# Patient Record
Sex: Female | Born: 1945 | Race: White | Hispanic: No | Smoking: Never smoker
Health system: Southern US, Community
[De-identification: ages and names within clinical notes are randomized; demographics above are authoritative.]

## PROBLEM LIST (undated history)

## (undated) DIAGNOSIS — H409 Unspecified glaucoma: Secondary | ICD-10-CM

## (undated) DIAGNOSIS — I119 Hypertensive heart disease without heart failure: Secondary | ICD-10-CM

## (undated) DIAGNOSIS — E7800 Pure hypercholesterolemia, unspecified: Secondary | ICD-10-CM

## (undated) DIAGNOSIS — H3581 Retinal edema: Secondary | ICD-10-CM

## (undated) DIAGNOSIS — N184 Chronic kidney disease, stage 4 (severe): Secondary | ICD-10-CM

## (undated) DIAGNOSIS — I639 Cerebral infarction, unspecified: Secondary | ICD-10-CM

## (undated) DIAGNOSIS — I5032 Chronic diastolic (congestive) heart failure: Secondary | ICD-10-CM

## (undated) DIAGNOSIS — E039 Hypothyroidism, unspecified: Secondary | ICD-10-CM

## (undated) DIAGNOSIS — Z9289 Personal history of other medical treatment: Secondary | ICD-10-CM

## (undated) DIAGNOSIS — D649 Anemia, unspecified: Secondary | ICD-10-CM

## (undated) DIAGNOSIS — I2721 Secondary pulmonary arterial hypertension: Secondary | ICD-10-CM

## (undated) DIAGNOSIS — I2510 Atherosclerotic heart disease of native coronary artery without angina pectoris: Secondary | ICD-10-CM

## (undated) DIAGNOSIS — J189 Pneumonia, unspecified organism: Secondary | ICD-10-CM

## (undated) DIAGNOSIS — H543 Unqualified visual loss, both eyes: Secondary | ICD-10-CM

## (undated) DIAGNOSIS — K219 Gastro-esophageal reflux disease without esophagitis: Secondary | ICD-10-CM

## (undated) DIAGNOSIS — I38 Endocarditis, valve unspecified: Secondary | ICD-10-CM

## (undated) DIAGNOSIS — E119 Type 2 diabetes mellitus without complications: Secondary | ICD-10-CM

## (undated) HISTORY — PX: CATARACT EXTRACTION W/ INTRAOCULAR LENS  IMPLANT, BILATERAL: SHX1307

---

## 2001-09-04 DIAGNOSIS — Z9289 Personal history of other medical treatment: Secondary | ICD-10-CM

## 2001-09-04 HISTORY — DX: Personal history of other medical treatment: Z92.89

## 2002-01-02 HISTORY — PX: CORONARY ARTERY BYPASS GRAFT: SHX141

## 2014-10-07 ENCOUNTER — Encounter (HOSPITAL_COMMUNITY): Payer: Self-pay

## 2014-10-07 ENCOUNTER — Inpatient Hospital Stay (HOSPITAL_COMMUNITY)
Admission: EM | Admit: 2014-10-07 | Discharge: 2014-10-10 | DRG: 291 | Payer: Medicare PPO | Attending: Internal Medicine | Admitting: Internal Medicine

## 2014-10-07 ENCOUNTER — Emergency Department (HOSPITAL_COMMUNITY): Payer: Medicare PPO

## 2014-10-07 DIAGNOSIS — J9601 Acute respiratory failure with hypoxia: Secondary | ICD-10-CM | POA: Diagnosis present

## 2014-10-07 DIAGNOSIS — E039 Hypothyroidism, unspecified: Secondary | ICD-10-CM | POA: Diagnosis present

## 2014-10-07 DIAGNOSIS — I272 Other secondary pulmonary hypertension: Secondary | ICD-10-CM | POA: Diagnosis present

## 2014-10-07 DIAGNOSIS — I509 Heart failure, unspecified: Secondary | ICD-10-CM

## 2014-10-07 DIAGNOSIS — N281 Cyst of kidney, acquired: Secondary | ICD-10-CM | POA: Diagnosis present

## 2014-10-07 DIAGNOSIS — Z888 Allergy status to other drugs, medicaments and biological substances status: Secondary | ICD-10-CM

## 2014-10-07 DIAGNOSIS — Z7982 Long term (current) use of aspirin: Secondary | ICD-10-CM

## 2014-10-07 DIAGNOSIS — H548 Legal blindness, as defined in USA: Secondary | ICD-10-CM | POA: Diagnosis present

## 2014-10-07 DIAGNOSIS — R0602 Shortness of breath: Secondary | ICD-10-CM | POA: Diagnosis present

## 2014-10-07 DIAGNOSIS — Z794 Long term (current) use of insulin: Secondary | ICD-10-CM

## 2014-10-07 DIAGNOSIS — I2510 Atherosclerotic heart disease of native coronary artery without angina pectoris: Secondary | ICD-10-CM | POA: Diagnosis present

## 2014-10-07 DIAGNOSIS — N289 Disorder of kidney and ureter, unspecified: Secondary | ICD-10-CM

## 2014-10-07 DIAGNOSIS — E785 Hyperlipidemia, unspecified: Secondary | ICD-10-CM | POA: Diagnosis present

## 2014-10-07 DIAGNOSIS — R011 Cardiac murmur, unspecified: Secondary | ICD-10-CM | POA: Diagnosis present

## 2014-10-07 DIAGNOSIS — N182 Chronic kidney disease, stage 2 (mild): Secondary | ICD-10-CM | POA: Diagnosis present

## 2014-10-07 DIAGNOSIS — I129 Hypertensive chronic kidney disease with stage 1 through stage 4 chronic kidney disease, or unspecified chronic kidney disease: Secondary | ICD-10-CM | POA: Diagnosis present

## 2014-10-07 DIAGNOSIS — D649 Anemia, unspecified: Secondary | ICD-10-CM

## 2014-10-07 DIAGNOSIS — H409 Unspecified glaucoma: Secondary | ICD-10-CM | POA: Diagnosis present

## 2014-10-07 DIAGNOSIS — N19 Unspecified kidney failure: Secondary | ICD-10-CM

## 2014-10-07 DIAGNOSIS — K219 Gastro-esophageal reflux disease without esophagitis: Secondary | ICD-10-CM | POA: Diagnosis present

## 2014-10-07 DIAGNOSIS — R001 Bradycardia, unspecified: Secondary | ICD-10-CM | POA: Diagnosis present

## 2014-10-07 DIAGNOSIS — I10 Essential (primary) hypertension: Secondary | ICD-10-CM

## 2014-10-07 DIAGNOSIS — I5043 Acute on chronic combined systolic (congestive) and diastolic (congestive) heart failure: Principal | ICD-10-CM | POA: Diagnosis present

## 2014-10-07 DIAGNOSIS — I5040 Unspecified combined systolic (congestive) and diastolic (congestive) heart failure: Secondary | ICD-10-CM

## 2014-10-07 DIAGNOSIS — I38 Endocarditis, valve unspecified: Secondary | ICD-10-CM | POA: Diagnosis not present

## 2014-10-07 DIAGNOSIS — E119 Type 2 diabetes mellitus without complications: Secondary | ICD-10-CM | POA: Diagnosis present

## 2014-10-07 DIAGNOSIS — R0600 Dyspnea, unspecified: Secondary | ICD-10-CM

## 2014-10-07 DIAGNOSIS — Z951 Presence of aortocoronary bypass graft: Secondary | ICD-10-CM | POA: Diagnosis not present

## 2014-10-07 DIAGNOSIS — E038 Other specified hypothyroidism: Secondary | ICD-10-CM

## 2014-10-07 DIAGNOSIS — E1121 Type 2 diabetes mellitus with diabetic nephropathy: Secondary | ICD-10-CM

## 2014-10-07 HISTORY — DX: Cerebral infarction, unspecified: I63.9

## 2014-10-07 HISTORY — DX: Hypothyroidism, unspecified: E03.9

## 2014-10-07 HISTORY — DX: Unspecified glaucoma: H40.9

## 2014-10-07 HISTORY — DX: Unqualified visual loss, both eyes: H54.3

## 2014-10-07 HISTORY — DX: Pure hypercholesterolemia, unspecified: E78.00

## 2014-10-07 HISTORY — DX: Personal history of other medical treatment: Z92.89

## 2014-10-07 HISTORY — DX: Atherosclerotic heart disease of native coronary artery without angina pectoris: I25.10

## 2014-10-07 HISTORY — DX: Type 2 diabetes mellitus without complications: E11.9

## 2014-10-07 HISTORY — DX: Pneumonia, unspecified organism: J18.9

## 2014-10-07 HISTORY — DX: Retinal edema: H35.81

## 2014-10-07 LAB — BASIC METABOLIC PANEL
Anion gap: 7 (ref 5–15)
BUN: 29 mg/dL — ABNORMAL HIGH (ref 6–23)
CHLORIDE: 105 mmol/L (ref 96–112)
CO2: 26 mmol/L (ref 19–32)
Calcium: 8.5 mg/dL (ref 8.4–10.5)
Creatinine, Ser: 1.82 mg/dL — ABNORMAL HIGH (ref 0.50–1.10)
GFR, EST AFRICAN AMERICAN: 32 mL/min — AB (ref >90)
GFR, EST NON AFRICAN AMERICAN: 27 mL/min — AB (ref >90)
Glucose, Bld: 175 mg/dL — ABNORMAL HIGH (ref 70–99)
POTASSIUM: 3.9 mmol/L (ref 3.5–5.1)
SODIUM: 138 mmol/L (ref 135–145)

## 2014-10-07 LAB — URINE MICROSCOPIC-ADD ON

## 2014-10-07 LAB — URINALYSIS, ROUTINE W REFLEX MICROSCOPIC
Bilirubin Urine: NEGATIVE
Glucose, UA: NEGATIVE mg/dL
Hgb urine dipstick: NEGATIVE
KETONES UR: NEGATIVE mg/dL
LEUKOCYTES UA: NEGATIVE
NITRITE: NEGATIVE
PROTEIN: 30 mg/dL — AB
Specific Gravity, Urine: 1.008 (ref 1.005–1.030)
Urobilinogen, UA: 0.2 mg/dL (ref 0.0–1.0)
pH: 7 (ref 5.0–8.0)

## 2014-10-07 LAB — CBC
HEMATOCRIT: 25.4 % — AB (ref 36.0–46.0)
Hemoglobin: 8 g/dL — ABNORMAL LOW (ref 12.0–15.0)
MCH: 28.5 pg (ref 26.0–34.0)
MCHC: 31.5 g/dL (ref 30.0–36.0)
MCV: 90.4 fL (ref 78.0–100.0)
Platelets: 263 10*3/uL (ref 150–400)
RBC: 2.81 MIL/uL — ABNORMAL LOW (ref 3.87–5.11)
RDW: 16.4 % — ABNORMAL HIGH (ref 11.5–15.5)
WBC: 7.4 10*3/uL (ref 4.0–10.5)

## 2014-10-07 LAB — BRAIN NATRIURETIC PEPTIDE: B NATRIURETIC PEPTIDE 5: 2584.7 pg/mL — AB (ref 0.0–100.0)

## 2014-10-07 LAB — OSMOLALITY: OSMOLALITY: 294 mosm/kg (ref 275–300)

## 2014-10-07 LAB — I-STAT TROPONIN, ED: TROPONIN I, POC: 0.02 ng/mL (ref 0.00–0.08)

## 2014-10-07 LAB — GLUCOSE, CAPILLARY: GLUCOSE-CAPILLARY: 218 mg/dL — AB (ref 70–99)

## 2014-10-07 LAB — TROPONIN I: Troponin I: <0.03 ng/mL (ref <0.031)

## 2014-10-07 LAB — SODIUM, URINE, RANDOM: SODIUM UR: 63 mmol/L

## 2014-10-07 LAB — CREATININE, URINE, RANDOM: CREATININE, URINE: 24.85 mg/dL

## 2014-10-07 LAB — OSMOLALITY, URINE: Osmolality, Ur: 262 mOsm/kg — ABNORMAL LOW (ref 390–1090)

## 2014-10-07 MED ORDER — LEVOTHYROXINE SODIUM 88 MCG PO TABS
88.0000 ug | ORAL_TABLET | Freq: Every day | ORAL | Status: DC
  Administered 2014-10-08 – 2014-10-09 (×2): 88 ug via ORAL
  Filled 2014-10-07 (×3): qty 1

## 2014-10-07 MED ORDER — TRAMADOL HCL 50 MG PO TABS: 100.0000 mg | ORAL_TABLET | Freq: Four times a day (QID) | ORAL | Status: DC

## 2014-10-07 MED ORDER — FUROSEMIDE 10 MG/ML IJ SOLN: 60.0000 mg | Freq: Two times a day (BID) | INTRAMUSCULAR | Status: DC

## 2014-10-07 MED ORDER — ARIPIPRAZOLE 2 MG PO TABS
2.0000 mg | ORAL_TABLET | Freq: Every day | ORAL | Status: DC
  Administered 2014-10-08 – 2014-10-10 (×3): 2 mg via ORAL
  Filled 2014-10-07 (×3): qty 1

## 2014-10-07 MED ORDER — SERTRALINE HCL 100 MG PO TABS
200.0000 mg | ORAL_TABLET | Freq: Every day | ORAL | Status: DC
  Administered 2014-10-07 – 2014-10-09 (×3): 200 mg via ORAL
  Filled 2014-10-07 (×5): qty 2

## 2014-10-07 MED ORDER — ASPIRIN 325 MG PO TABS
325.0000 mg | ORAL_TABLET | Freq: Every day | ORAL | Status: DC
  Administered 2014-10-08: 325 mg via ORAL
  Filled 2014-10-07: qty 1

## 2014-10-07 MED ORDER — POLYVINYL ALCOHOL 1.4 % OP SOLN
1.0000 [drp] | Freq: Two times a day (BID) | OPHTHALMIC | Status: DC
  Administered 2014-10-07 – 2014-10-10 (×6): 1 [drp] via OPHTHALMIC
  Filled 2014-10-07 (×2): qty 15

## 2014-10-07 MED ORDER — INSULIN GLARGINE 100 UNIT/ML ~~LOC~~ SOLN
20.0000 [IU] | Freq: Two times a day (BID) | SUBCUTANEOUS | Status: DC
  Administered 2014-10-07 – 2014-10-10 (×3): 20 [IU] via SUBCUTANEOUS
  Filled 2014-10-07 (×7): qty 0.2

## 2014-10-07 MED ORDER — FUROSEMIDE 10 MG/ML IJ SOLN
40.0000 mg | Freq: Once | INTRAMUSCULAR | Status: AC
  Administered 2014-10-07: 40 mg via INTRAVENOUS
  Filled 2014-10-07: qty 4

## 2014-10-07 MED ORDER — LOSARTAN POTASSIUM 50 MG PO TABS: 50.0000 mg | ORAL_TABLET | Freq: Every day | ORAL | Status: DC

## 2014-10-07 MED ORDER — CLOPIDOGREL BISULFATE 75 MG PO TABS
75.0000 mg | ORAL_TABLET | Freq: Every day | ORAL | Status: DC
  Administered 2014-10-08 – 2014-10-10 (×3): 75 mg via ORAL
  Filled 2014-10-07 (×3): qty 1

## 2014-10-07 MED ORDER — SODIUM CHLORIDE 0.9 % IJ SOLN
3.0000 mL | Freq: Two times a day (BID) | INTRAMUSCULAR | Status: DC
  Administered 2014-10-07 – 2014-10-10 (×6): 3 mL via INTRAVENOUS

## 2014-10-07 MED ORDER — FUROSEMIDE 10 MG/ML IJ SOLN
60.0000 mg | Freq: Two times a day (BID) | INTRAMUSCULAR | Status: DC
  Administered 2014-10-08 – 2014-10-10 (×5): 60 mg via INTRAVENOUS
  Filled 2014-10-07 (×7): qty 6

## 2014-10-07 MED ORDER — POTASSIUM CHLORIDE CRYS ER 20 MEQ PO TBCR
20.0000 meq | EXTENDED_RELEASE_TABLET | Freq: Two times a day (BID) | ORAL | Status: DC
  Administered 2014-10-07 – 2014-10-10 (×6): 20 meq via ORAL
  Filled 2014-10-07 (×7): qty 1

## 2014-10-07 MED ORDER — SODIUM CHLORIDE 0.9 % IJ SOLN: 3.0000 mL | INTRAMUSCULAR | Status: DC | PRN

## 2014-10-07 MED ORDER — FUROSEMIDE 10 MG/ML IJ SOLN
80.0000 mg | Freq: Two times a day (BID) | INTRAMUSCULAR | Status: DC
  Filled 2014-10-07: qty 8

## 2014-10-07 MED ORDER — TRAMADOL HCL 50 MG PO TABS
100.0000 mg | ORAL_TABLET | Freq: Two times a day (BID) | ORAL | Status: DC
  Administered 2014-10-07 – 2014-10-09 (×2): 100 mg via ORAL
  Filled 2014-10-07 (×3): qty 2

## 2014-10-07 MED ORDER — PANTOPRAZOLE SODIUM 20 MG PO TBEC
20.0000 mg | DELAYED_RELEASE_TABLET | Freq: Every day | ORAL | Status: DC
  Administered 2014-10-08 – 2014-10-10 (×3): 20 mg via ORAL
  Filled 2014-10-07 (×3): qty 1

## 2014-10-07 MED ORDER — DORZOLAMIDE HCL-TIMOLOL MAL 2-0.5 % OP SOLN
1.0000 [drp] | Freq: Two times a day (BID) | OPHTHALMIC | Status: DC
  Administered 2014-10-07 – 2014-10-10 (×6): 1 [drp] via OPHTHALMIC
  Filled 2014-10-07 (×2): qty 10

## 2014-10-07 MED ORDER — PREDNISOLONE ACETATE 1 % OP SUSP
1.0000 [drp] | Freq: Four times a day (QID) | OPHTHALMIC | Status: DC
  Administered 2014-10-07 – 2014-10-10 (×11): 1 [drp] via OPHTHALMIC
  Filled 2014-10-07 (×2): qty 1

## 2014-10-07 MED ORDER — SODIUM CHLORIDE 0.9 % IV SOLN: 250.0000 mL | INTRAVENOUS | Status: DC | PRN

## 2014-10-07 MED ORDER — ISOSORBIDE DINITRATE 10 MG PO TABS
10.0000 mg | ORAL_TABLET | Freq: Three times a day (TID) | ORAL | Status: DC
  Administered 2014-10-07 – 2014-10-10 (×8): 10 mg via ORAL
  Filled 2014-10-07 (×11): qty 1

## 2014-10-07 MED ORDER — CARVEDILOL 6.25 MG PO TABS
6.2500 mg | ORAL_TABLET | Freq: Two times a day (BID) | ORAL | Status: DC
  Administered 2014-10-08 – 2014-10-09 (×3): 6.25 mg via ORAL
  Filled 2014-10-07 (×7): qty 1

## 2014-10-07 MED ORDER — INSULIN ASPART 100 UNIT/ML ~~LOC~~ SOLN
0.0000 [IU] | Freq: Three times a day (TID) | SUBCUTANEOUS | Status: DC
  Administered 2014-10-08: 1 [IU] via SUBCUTANEOUS
  Administered 2014-10-09: 3 [IU] via SUBCUTANEOUS
  Administered 2014-10-09: 2 [IU] via SUBCUTANEOUS
  Administered 2014-10-10 (×2): 3 [IU] via SUBCUTANEOUS

## 2014-10-07 MED ORDER — HEPARIN SODIUM (PORCINE) 5000 UNIT/ML IJ SOLN
5000.0000 [IU] | Freq: Three times a day (TID) | INTRAMUSCULAR | Status: DC
  Administered 2014-10-07 – 2014-10-10 (×9): 5000 [IU] via SUBCUTANEOUS
  Filled 2014-10-07 (×10): qty 1

## 2014-10-07 MED ORDER — ATORVASTATIN CALCIUM 80 MG PO TABS
80.0000 mg | ORAL_TABLET | Freq: Every day | ORAL | Status: DC
Start: 2014-10-07 — End: 2014-10-10
  Administered 2014-10-07 – 2014-10-09 (×3): 80 mg via ORAL
  Filled 2014-10-07 (×5): qty 1

## 2014-10-07 MED ORDER — INSULIN ASPART 100 UNIT/ML ~~LOC~~ SOLN
0.0000 [IU] | Freq: Every day | SUBCUTANEOUS | Status: DC
  Administered 2014-10-09: 4 [IU] via SUBCUTANEOUS

## 2014-10-07 MED ORDER — HYDRALAZINE HCL 50 MG PO TABS
50.0000 mg | ORAL_TABLET | Freq: Three times a day (TID) | ORAL | Status: DC
  Administered 2014-10-07 – 2014-10-09 (×5): 50 mg via ORAL
  Filled 2014-10-07 (×9): qty 1

## 2014-10-07 MED ORDER — AMLODIPINE BESYLATE 10 MG PO TABS
10.0000 mg | ORAL_TABLET | Freq: Every day | ORAL | Status: DC
  Administered 2014-10-08 – 2014-10-10 (×3): 10 mg via ORAL
  Filled 2014-10-07 (×3): qty 1

## 2014-10-07 MED ORDER — FUROSEMIDE 10 MG/ML IJ SOLN
40.0000 mg | Freq: Once | INTRAMUSCULAR | Status: AC
  Administered 2014-10-07: 40 mg via INTRAVENOUS

## 2014-10-07 MED ORDER — ONDANSETRON HCL 4 MG/2ML IJ SOLN: 4.0000 mg | Freq: Four times a day (QID) | INTRAMUSCULAR | Status: DC | PRN

## 2014-10-07 MED ORDER — POLYETHYL GLYCOL-PROPYL GLYCOL 0.4-0.3 % OP SOLN: 1.0000 [drp] | Freq: Two times a day (BID) | OPHTHALMIC | Status: DC

## 2014-10-07 MED ORDER — GUAIFENESIN-DM 100-10 MG/5ML PO SYRP
5.0000 mL | ORAL_SOLUTION | ORAL | Status: DC | PRN
  Administered 2014-10-07: 5 mL via ORAL
  Filled 2014-10-07: qty 5

## 2014-10-07 MED ORDER — HYDRALAZINE HCL 50 MG PO TABS: 50.0000 mg | ORAL_TABLET | Freq: Three times a day (TID) | ORAL | Status: DC

## 2014-10-07 MED ORDER — ACETAMINOPHEN 325 MG PO TABS
650.0000 mg | ORAL_TABLET | ORAL | Status: DC | PRN
  Administered 2014-10-10: 650 mg via ORAL
  Filled 2014-10-07: qty 2

## 2014-10-07 MED ORDER — LEVOTHYROXINE SODIUM 88 MCG PO TABS
0.8800 ug | ORAL_TABLET | Freq: Every day | ORAL | Status: DC
Start: 2014-10-07 — End: 2014-10-07

## 2014-10-07 NOTE — ED Notes (Signed)
Order Tray for patient.  

## 2014-10-07 NOTE — ED Notes (Signed)
Admitting MD at the bedside.  

## 2014-10-07 NOTE — ED Notes (Signed)
MD at the bedside  

## 2014-10-07 NOTE — ED Notes (Signed)
Per EMS, [facility] [place] [address] complaining of SOB that has been going on for four days. Patient had dizziness that started today. Patient started to complain of all the symptoms the day after her mother died. Patient has been bradycardic with EMS. 46 HR 96% on RA, 170/70, CBG 298,  Patient has history of HTN, DM, and Blindness. Lung fields are clear. Patient is alert and oriented x4 upon arrival.

## 2014-10-07 NOTE — ED Notes (Signed)
Xray at the bedside.

## 2014-10-07 NOTE — ED Notes (Signed)
Phlebotomy at the bedside  

## 2014-10-07 NOTE — Consult Note (Signed)
CARDIOLOGY CONSULT NOTE   Patient ID: Taylor Padilla MRN: [id number], DOB/AGE: 1946/05/21   Admit date: 10/07/2014 Date of Consult: 10/07/2014   Primary Physician: No primary care provider on file. Primary Cardiologist: New  Pt. Profile  69 year old Caucasian female with past medical history of HTN, DM, glaucoma with severely diminished vision, chronic kidney disease, anemia, and CAD s/p 4v CABG in 2003 in [place] present with HF symptom  Problem List  Past Medical History  Diagnosis Date  . Diabetes mellitus without complication   . Hypertension   . Glaucoma   . Blindness   . Renal disorder     Chronic Kidney Failure  . CAD (coronary artery disease)     Past Surgical History  Procedure Laterality Date  . Cardiac surgery      Quadruple Bypass Surgery 2003 at [facility]     Allergies  Allergies  Allergen Reactions  . Epinephrine     HPI   The patient is a pleasant 69 year old Caucasian female with past medical history of HTN, DM, glaucoma with severely diminished vision, chronic kidney disease, anemia, and CAD s/p 4v CABG in 2003 in [place] (by Dr. Harrison Mons?). According to the patient, she denies ever having chest pain in the past even prior to the bypass surgery. She states she went to the hospital because her systolic blood pressure was over 200 at the time, and the next thing she knew, she was told she had significant coronary artery disease and underwent bypass surgery. Since that time, she has been doing well. It has been several years since she was last seen by her cardiologist. Her last stress test was over a year ago. Since then, most for cardiac medication has been managed by her PCP in [place]. She denies any awareness of weakened heart or low ejection fraction, however she states she has been taking Lasix for many years. She states her husband died over a year ago, and she was having trouble taking care of herself living in a big house (they have no  children), therefore she decided to move to [facility] in December 2015 to be closer to her brother's family. She currently lives in [facility] in [place]. She states they have been managing her medication. Due to her limited sight with glaucoma, she has not been very active since moved to assisted living facility. Since December, patient has gradually increased the number of pillows to 3, in the last month she began to notice increasing lower extremity edema. She also endorsed a nonproductive cough. She does frequently wake up in the middle of the night, however she has attributed to her usual habit instead of shortness of breath. She denies ever having chest pain. Her mother passed away on 10/06/2014, since 1/31, patient has been struggling with increasing shortness of breath at rest and orthopnea. She decided to seek medical attention at [facility] on 09/06/2014.  On arrival, significant laboratory finding include creatinine of 1.82. BNP 2584. Hemoglobin 8.0. She states she has a known history of anemia. Glucose 175. Chest x-ray was concerning for heart failure. EKG showed sinus bradycardia with nonspecific T-wave changes. Cardiology has been consulted for heart failure.  Inpatient Medications  . losartan  50 mg Oral Daily    Family History Family History  Problem Relation Age of Onset  . CAD Neg Hx      Social History History   Social History  . Marital Status: Widowed    Spouse Name: N/A  Number of Children: N/A  . Years of Education: N/A   Occupational History  . Not on file.   Social History Main Topics  . Smoking status: Never Smoker   . Smokeless tobacco: Never Used  . Alcohol Use: No  . Drug Use: No  . Sexual Activity: Not on file   Other Topics Concern  . Not on file   Social History Narrative   Live in [facility], moved from [place] in Dec 2015     Review of Systems  General:  No chills, fever,  night sweats or weight changes.  Cardiovascular:  No chest pain. +dyspnea on exertion, edema, orthopnea, paroxysmal nocturnal dyspnea. Dermatological: No rash, lesions/masses Respiratory: +cough, dyspnea Urologic: No hematuria, dysuria Abdominal:   No nausea, vomiting, diarrhea, bright red blood per rectum, melena, or hematemesis Neurologic:  No visual changes, changes in mental status. +wkns All other systems reviewed and are otherwise negative except as noted above.  Physical Exam  Blood pressure 166/50, pulse 48, temperature 97.9 F (36.6 C), temperature source Oral, resp. rate 19, height  (1.549 m), weight 149 lb (67.586 kg), SpO2 93 %.  General: Pleasant, NAD Psych: Normal affect. Neuro: Alert and oriented X 3. Moves all extremities spontaneously. HEENT: Normal  Neck: Murmur radiate to bilateral neck, unable to differentiate bruits  +JVD. Lungs:  Resp regular and unlabored. Bilateral basilar rale.  Heart: RRR no s3, s4. 2/6 systolic murmur Abdomen: Soft, non-tender, non-distended, BS + x 4.  Extremities: No clubbing, cyanosis. DP/PT/Radials 2+ and equal bilaterally. 3+ pitting edema bilaterally.  Labs  No results for input(s): CKTOTAL, CKMB, TROPONINI in the last 72 hours. Lab Results  Component Value Date   WBC 7.4 10/07/2014   HGB 8.0* 10/07/2014   HCT 25.4* 10/07/2014   MCV 90.4 10/07/2014   PLT 263 10/07/2014     Recent Labs Lab 10/07/14 1327  NA 138  K 3.9  CL 105  CO2 26  BUN 29*  CREATININE 1.82*  CALCIUM 8.5  GLUCOSE 175*   No results found for: CHOL, HDL, LDLCALC, TRIG No results found for: DDIMER  Radiology/Studies  Dg Chest Port 1 View  10/07/2014   CLINICAL DATA:  Shortness of breath  EXAM: PORTABLE CHEST - 1 VIEW  COMPARISON:  None available  FINDINGS: Previous CABG. Interstitial and airspace opacities in both lung bases, right greater than left. Small pleural effusions. Mild cardiomegaly. Atheromatous aorta. No pneumothorax.  IMPRESSION: 1.  Mild cardiomegaly with bibasilar infiltrate/edema and small effusions, suspect CHF.   Electronically Signed   By: Oley Balm M.D.   On: 10/07/2014 13:33    ECG  EKG showed sinus bradycardia with nonspecific T-wave changes.  ASSESSMENT AND PLAN  1. Acute on chronic (likely systolic) heart failure  - limited information regarding her PMH. Although her SOB began 2 days after her mother died, other HF symptom has been going for at least >2 month  - obtain echocardiogram to establish baseline, TSH  - IV diuresis with  BID lasix, place folley for 48 hours  - continue coreg, add isordil and hydralazine   2. Chronic renal insufficiency, unknown baseline  - may benefit from nephrology eval and followup locally  - hold ARB, added antihypertensive meds as below  3. CAD s/p 4v CABG in 2003  - continue ASA and plavix, trend hgb  - trend trop. Denies any CP recently  4. Anemia, h/o anemia per pt, trend 5. HTN  - uncontrolled. Check renal artery  duplex  - continue amlodipine, coreg, clonidine. Hold cozaar given renal dysfunction. Add isordil and hydralazine 6. DM 7. glaucoma with severely diminished vision 8. Systolic murmur on exam: pending echo   Ramond DialSigned, Meng, Hao, PA-C 10/07/2014, 4:33 PM  Patient examined chart reviewed. Poorly controlled diabetic with HTN.  Distant CABG in [place] 2003.  Does not describe MI before surgery or previous CHF.  Troponin negative no acute ECG changes just bradycardic.  Significant renal failure Cr 1.8.  Exam remarkable for SEM basilar rales and plus 2 LE edema.  She is also nearly blind and appears to be barely able to care for herself.  Echo to assess EF and renal duplex to assess kidneys.  Stop ARB And Rx BP and CAD with hydralazine and nitrates.  Avoid beta blockers with renal failure and bradycardia.  Will likely need nephrology ivolved in am as Cr will rise with diuresis   Charlton HawsPeter Nishan

## 2014-10-07 NOTE — ED Notes (Signed)
SPoke with Admitting MD about patient's BP. RN 3East made aware.

## 2014-10-07 NOTE — ED Notes (Signed)
Attempted Report x1.   

## 2014-10-07 NOTE — H&P (Signed)
[facility] History and Physical                                                                                    Taylor Padilla, is a 69 y.o. female  MRN: [id number]   DOB - 1946-05-16  Admit Date - 10/07/2014  Outpatient Primary MD for the patient is No primary care provider on file.  With History of -  Past Medical History  Diagnosis Date  . Diabetes mellitus without complication   . Hypertension   . Glaucoma   . Blindness   . Renal disorder     Chronic Kidney Failure  . CAD (coronary artery disease)       Past Surgical History  Procedure Laterality Date  . Cardiac surgery      Quadruple Bypass Surgery 2003     in for   Chief Complaint  Patient presents with  . Shortness of Breath     HPI Taylor Padilla  is a 69 y.o. female, with history of CABG in 2003 in [place], diabetes, hypertension, chronic kidney disease stage II and legally blind. Patient currently resides at assisted living facility. She recently moved to the [facility] area December 2015. She was sent over from the assisted living facility for complaints of shortness of breath. Chest x-ray completed in the ER revealed mild cardiomegaly with bibasilar infiltrate versus edema and small effusions which appear to be consistent with CHF. ProBNP was elevated at 2584 and troponin was normal 0.02. Utilizing care everywhere I was able to determine that the patient's current renal function 29/1.82 was at her baseline and her hemoglobin of 8.0 was slightly below her baseline of 8.6. She also undergone an echocardiogram in 2013 which revealed preserved LVEF with grade 2 diastolic dysfunction, with mild pulmonary hypertension and mild pulmonic valvular regurg. When she was arrives to the emergency department she was hypoxemic with saturations of 89% oxygen was placed. Because of the abnormal x-ray findings she was given IV Lasix and has begun diuresing. Patient reports she has yet to establish with a  primary care physician or cardiologist in the area. Patient reports at baseline with assistance she is able to ambulate with walker and has noticed dyspnea on exertion without chest pain and progressive lower stream and the edema for 4 weeks. She endorses that prior to her CABG procedure she did not have any chest pain and her primary presenting symptom was hypertensive urgency.  Review of Systems   In addition to the HPI above;  No Fever-chills, No Headache, No changes with Vision or hearing, No problems swallowing food or Liquids, No Chest pain, palpitations, or cough No Abdominal pain, No Nausea or Vomiting, Bowel movements are regular, No Blood in stool or Urine, or dysuria No new skin rashes or bruises, No new joints pains-aches,  No new weakness, tingling, numbness in any extremity, No recent weight gain or loss, No polyuria, polydypsia or polyphagia, A full 10 point Review of Systems was done, except as stated above, all other Review of Systems were negative.  Social History History  Substance Use Topics  . Smoking status: Never Smoker   . Smokeless  tobacco: Never Used  . Alcohol Use: No       Resides at assisted living facility since December 2015  Family History History reviewed. No pertinent family history.  Prior to Admission medications   Medication Sig Start Date End Date Taking? Authorizing Provider  amLODipine (NORVASC) 10 MG tablet Take 10 mg by mouth daily. 09/27/14  Yes Historical Provider, MD  ARIPiprazole (ABILIFY) 2 MG tablet Take 2 mg by mouth daily. 09/12/14  Yes Historical Provider, MD  aspirin 325 MG tablet Take 325 mg by mouth daily.   Yes Historical Provider, MD  atorvastatin (LIPITOR) 80 MG tablet Take 80 mg by mouth at bedtime. 09/21/14  Yes Historical Provider, MD  carvedilol (COREG) 25 MG tablet Take 25 mg by mouth 2 (two) times daily. 09/21/14  Yes Historical Provider, MD  cloNIDine (CATAPRES) 0.1 MG tablet Take 0.1 mg by mouth 4 (four) times daily.  09/21/14  Yes Historical Provider, MD  clopidogrel (PLAVIX) 75 MG tablet Take 75 mg by mouth daily. 09/24/14  Yes Historical Provider, MD  dorzolamide-timolol (COSOPT) 22.3-6.8 MG/ML ophthalmic solution Place 1 drop into both eyes every 12 (twelve) hours. 10/06/14  Yes Historical Provider, MD  furosemide (LASIX) 40 MG tablet Take 40 mg by mouth daily. Alternating every other day with 80mg  09/12/14  Yes Historical Provider, MD  furosemide (LASIX) 80 MG tablet Take 80 mg by mouth every other day. Alternating every other day with 40mg  09/13/14  Yes Historical Provider, MD  insulin aspart (NOVOLOG) 100 UNIT/ML injection Inject 5 Units into the skin 3 (three) times daily with meals.   Yes Historical Provider, MD  lansoprazole (PREVACID) 30 MG capsule Take 30 mg by mouth daily. 09/27/14  Yes Historical Provider, MD  LANTUS SOLOSTAR 100 UNIT/ML Solostar Pen Inject 20 Units into the skin 2 (two) times daily. 08/03/14  Yes Historical Provider, MD  levothyroxine (SYNTHROID, LEVOTHROID) 88 MCG tablet Take 0.88 mcg by mouth daily. 09/28/14  Yes Historical Provider, MD  losartan (COZAAR) 50 MG tablet Take 50 mg by mouth daily. 09/26/14  Yes Historical Provider, MD  nitroGLYCERIN 2.5 MG CR capsule Take 2.5 mg by mouth every 12 (twelve) hours as needed. Chest pain 07/03/14  Yes Historical Provider, MD  Polyethyl Glycol-Propyl Glycol 0.4-0.3 % SOLN Apply 1 drop to eye 2 (two) times daily.   Yes Historical Provider, MD  prednisoLONE acetate (PRED FORTE) 1 % ophthalmic suspension Place 1 drop into the left eye 4 (four) times daily. 09/27/14  Yes Historical Provider, MD  ranitidine (ZANTAC) 150 MG tablet Take 150 mg by mouth 2 (two) times daily as needed. indigestion 09/24/14  Yes Historical Provider, MD  sertraline (ZOLOFT) 100 MG tablet Take 200 mg by mouth at bedtime. 09/26/14  Yes Historical Provider, MD  traMADol (ULTRAM) 50 MG tablet Take 100 mg by mouth every 6 (six) hours. 09/14/14  Yes Historical Provider, MD    Allergies   Allergen Reactions  . Epinephrine     Physical Exam  Vitals  Blood pressure 166/50, pulse 48, temperature 97.9 F (36.6 C), temperature source Oral, resp. rate 19, height 5\' 1"  (1.549 m), weight 149 lb (67.586 kg), SpO2 93 %.   General:  lying in bed in NAD, noted to be legally blind  Psych:  Normal affect and insight, Not Suicidal or Homicidal, Awake Alert, Oriented X 3.  Neuro:   No F.N deficits, ALL C.Nerves Intact except for previously documented legal blindness, Strength 4/5 all 4 extremities, Sensation intact all 4 extremities.  ENT:  Ears and Eyes appear Normal, Conjunctivae clear, PER. Moist oral mucosa without erythema or exudates.  Neck:  Supple, No lymphadenopathy appreciated  Respiratory:  Symmetrical chest wall movement, Good air movement bilaterally, bibasilar crackles, is a cannula oxygen  Cardiac:  RRR, systolic murmur left sternal border second intercostal space adjacent to the sternum, marked 2+ bilateral LE edema noted, no JVD.    Abdomen:  Positive bowel sounds, Soft, Non tender, Non distended,  No masses appreciated  Skin:  No Cyanosis, Normal Skin Turgor, No Skin Rash or Bruise.  Extremities: Asymmetrical secondary to marked lower extremity edema,  no effusions.  Data Review  CBC  Recent Labs Lab 10/07/14 1327  WBC 7.4  HGB 8.0*  HCT 25.4*  PLT 263  MCV 90.4  MCH 28.5  MCHC 31.5  RDW 16.4*    Chemistries   Recent Labs Lab 10/07/14 1327  NA 138  K 3.9  CL 105  CO2 26  GLUCOSE 175*  BUN 29*  CREATININE 1.82*  CALCIUM 8.5    estimated creatinine clearance is 25.7 mL/min (by C-G formula based on Cr of 1.82).  No results for input(s): TSH, T4TOTAL, T3FREE, THYROIDAB in the last 72 hours.  Invalid input(s): FREET3  Coagulation profile No results for input(s): INR, PROTIME in the last 168 hours.  No results for input(s): DDIMER in the last 72 hours.  Cardiac Enzymes No results for input(s): CKMB, TROPONINI, MYOGLOBIN in the  last 168 hours.  Invalid input(s): CK  Invalid input(s): POCBNP  Urinalysis No results found for: COLORURINE, APPEARANCEUR, LABSPEC, PHURINE, GLUCOSEU, HGBUR, BILIRUBINUR, KETONESUR, PROTEINUR, UROBILINOGEN, NITRITE, LEUKOCYTESUR  Imaging results:   Dg Chest Port 1 View  10/07/2014   CLINICAL DATA:  Shortness of breath  EXAM: PORTABLE CHEST - 1 VIEW  COMPARISON:  None available  FINDINGS: Previous CABG. Interstitial and airspace opacities in both lung bases, right greater than left. Small pleural effusions. Mild cardiomegaly. Atheromatous aorta. No pneumothorax.  IMPRESSION: 1. Mild cardiomegaly with bibasilar infiltrate/edema and small effusions, suspect CHF.   Electronically Signed   By: Oley Balm M.D.   On: 10/07/2014 13:33    My personal review of EKG: NSR, No ST changes noted.   Assessment & Plan  Principal Problem:   Acute respiratory failure with hypoxia/ SOB/likely acute grade 2 diastolic heart failure -Seems related to acute heart failure and based on old echocardiogram most likely grade 2 diastolic dysfunction -Obtain echocardiogram this admission -IV Lasix twice daily with potassium supplementation -Supportive care with oxygen   Active Problems:   CAD  -CABG greater than 10 years ago -Have asked EDP consult cardiology -Repeat EKG in a.m. and cycle cardiac enzymes -Continue aspirin and Plavix as well as statin    Bradycardia -Baseline heart rate 53 based on review of care everywhere records -We'll decrease carvedilol dose for now and hold Catapres -Could be contributing to current heart failure symptoms by decreasing cardiac output    Diabetes mellitus without complication -Continue home Lantus -Check hemoglobin A1c -Provide sliding scale insulin    Hypertension -Continue Cozaar and Norvasc -Catapres and Coreg adjusted as above    CKD, stage II -Baseline renal function 32/1.87 and current renal function at baseline    Hypothyroidism -Continue  Synthroid -Check TSH    Dyslipidemia -Continue statin    GERD  -cont PPI    Anemia -Baseline hemoglobin 8.6    DVT Prophylaxis: subcutaneous heparin  Family Communication:  No family at bedside   Code Status:  Full  Condition:  Stable  Time spent in minutes : 60   ELLIS,ALLISON L. ANP on 10/07/2014 at 4:19 PM  Between 7am to 7pm - Pager - [phone]  After 7pm go to [email or web address] - password [email or web address]  And look for the night coverage person covering me after hours  [facility]

## 2014-10-07 NOTE — ED Notes (Signed)
MD aware of patient's BP.

## 2014-10-07 NOTE — ED Provider Notes (Signed)
CSN: [id number]     Arrival date & time 10/07/14  1235 History   First MD Initiated Contact with Patient 10/07/14 1318     Chief Complaint  Patient presents with  . Shortness of Breath     (Consider location/radiation/quality/duration/timing/severity/associated sxs/prior Treatment) HPI   Taylor Padilla is a 69 y.o. female who presents for evaluation of shortness of breath ongoing for 4 days.  She is also some swelling in her legs for one month.  She has had a history of CABG, but does not have ongoing cardiac problems that she knows about.  Takes Lasix, for peripheral edema.  She has had mild cough that is occasionally productive of white sputum.  She denies fever, chills, nausea, vomiting, weakness or dizziness.  She arrives by EMS.  On arrival on room air, her oxygen saturation was low at 89%.  She denies chest pain, back pain, or abdominal pain.  She is taking her usual medications.  There are no other known modifying factors.   Past Medical History  Diagnosis Date  . Diabetes mellitus without complication   . Hypertension   . Glaucoma   . Blindness   . Renal disorder     Chronic Kidney Failure   Past Surgical History  Procedure Laterality Date  . Cardiac surgery      Quadruple Bypass Surgery 2003    History reviewed. No pertinent family history. History  Substance Use Topics  . Smoking status: Never Smoker   . Smokeless tobacco: Never Used  . Alcohol Use: No   OB History    No data available     Review of Systems  All other systems reviewed and are negative.     Allergies  Epinephrine  Home Medications   Prior to Admission medications   Medication Sig Start Date End Date Taking? Authorizing Provider  amLODipine (NORVASC) 10 MG tablet Take 10 mg by mouth daily. 09/27/14  Yes Historical Provider, MD  ARIPiprazole (ABILIFY) 2 MG tablet Take 2 mg by mouth daily. 09/12/14  Yes Historical Provider, MD  aspirin 325 MG tablet Take 325 mg by mouth daily.   Yes Historical  Provider, MD  atorvastatin (LIPITOR) 80 MG tablet Take 80 mg by mouth at bedtime. 09/21/14  Yes Historical Provider, MD  carvedilol (COREG) 25 MG tablet Take 25 mg by mouth 2 (two) times daily. 09/21/14  Yes Historical Provider, MD  cloNIDine (CATAPRES) 0.1 MG tablet Take 0.1 mg by mouth 4 (four) times daily. 09/21/14  Yes Historical Provider, MD  clopidogrel (PLAVIX) 75 MG tablet Take 75 mg by mouth daily. 09/24/14  Yes Historical Provider, MD  dorzolamide-timolol (COSOPT) 22.3-6.8 MG/ML ophthalmic solution Place 1 drop into both eyes every 12 (twelve) hours. 10/06/14  Yes Historical Provider, MD  furosemide (LASIX) 40 MG tablet Take 40 mg by mouth daily. Alternating every other day with  09/12/14  Yes Historical Provider, MD  furosemide (LASIX) 80 MG tablet Take 80 mg by mouth every other day. Alternating every other day with  09/13/14  Yes Historical Provider, MD  insulin aspart (NOVOLOG) 100 UNIT/ML injection Inject 5 Units into the skin 3 (three) times daily with meals.   Yes Historical Provider, MD  lansoprazole (PREVACID) 30 MG capsule Take 30 mg by mouth daily. 09/27/14  Yes Historical Provider, MD  LANTUS SOLOSTAR 100 UNIT/ML Solostar Pen Inject 20 Units into the skin 2 (two) times daily. 08/03/14  Yes Historical Provider, MD  levothyroxine (SYNTHROID, LEVOTHROID) 88 MCG tablet Take 0.88 mcg by mouth  daily. 09/28/14  Yes Historical Provider, MD  losartan (COZAAR) 50 MG tablet Take 50 mg by mouth daily. 09/26/14  Yes Historical Provider, MD  nitroGLYCERIN 2.5 MG CR capsule Take 2.5 mg by mouth every 12 (twelve) hours as needed. Chest pain 07/03/14  Yes Historical Provider, MD  Polyethyl Glycol-Propyl Glycol 0.4-0.3 % SOLN Apply 1 drop to eye 2 (two) times daily.   Yes Historical Provider, MD  prednisoLONE acetate (PRED FORTE) 1 % ophthalmic suspension Place 1 drop into the left eye 4 (four) times daily. 09/27/14  Yes Historical Provider, MD  ranitidine (ZANTAC) 150 MG tablet Take 150 mg by mouth 2  (two) times daily as needed. indigestion 09/24/14  Yes Historical Provider, MD  sertraline (ZOLOFT) 100 MG tablet Take 200 mg by mouth at bedtime. 09/26/14  Yes Historical Provider, MD  traMADol (ULTRAM) 50 MG tablet Take 100 mg by mouth every 6 (six) hours. 09/14/14  Yes Historical Provider, MD   BP 172/54 mmHg  Pulse 50  Temp(Src) 97.9 F (36.6 C) (Oral)  Resp 19  Ht  (1.549 m)  Wt 149 lb (67.586 kg)  BMI 28.17 kg/m2  SpO2 93% Physical Exam  Constitutional: She is oriented to person, place, and time. She appears well-developed and well-nourished.  HENT:  Head: Normocephalic and atraumatic.  Right Ear: External ear normal.  Left Ear: External ear normal.  Eyes: Conjunctivae and EOM are normal. Pupils are equal, round, and reactive to light.  Neck: Normal range of motion and phonation normal. Neck supple.  Cardiovascular: Normal rate, regular rhythm and normal heart sounds.   JVD at 45.  2/6 systolic murmur left base.  Pulmonary/Chest: Effort normal. No respiratory distress. She has rales (bases bilaterally). She exhibits no tenderness and no bony tenderness.  Abdominal: Soft. There is no tenderness.  Musculoskeletal: Normal range of motion. She exhibits edema (3+ bilateral lower extremities).  Neurological: She is alert and oriented to person, place, and time. No cranial nerve deficit or sensory deficit. She exhibits normal muscle tone. Coordination normal.  Skin: Skin is warm, dry and intact.  Psychiatric: She has a normal mood and affect. Her behavior is normal. Judgment and thought content normal.  Nursing note and vitals reviewed.   ED Course  Procedures (including critical care time)  Medications  furosemide (LASIX) injection 40 mg (not administered)    Patient Vitals for the past 24 hrs:  BP Temp Temp src Pulse Resp SpO2 Height Weight  10/07/14 1445 (!) 172/54 mmHg - - (!) 50 19 - - -  10/07/14 1430 176/56 mmHg - - (!) 46 20 93 % - -  10/07/14 1400 (!) 168/54 mmHg  - - (!) 46 17 93 % - -  10/07/14 1345 (!) 166/50 mmHg - - (!) 47 18 95 % - -  10/07/14 1336 (!) 169/45 mmHg - - (!) 48 18 97 % - -  10/07/14 1330 - - - (!) 50 14 95 % - -  10/07/14 1315 - - - (!) 50 22 96 % - -  10/07/14 1302 (!) 169/52 mmHg - - (!) 53 21 93 % - -  10/07/14 1250 - - - - - 94 % - -  10/07/14 1248 172/55 mmHg 97.9 F (36.6 C) Oral (!) 48 14 (!) 89 %  (1.549 m) 149 lb (67.586 kg)  10/07/14 1245 172/55 mmHg - - - - - - -    Discussed with Hospitalist to arrange admission    Labs Review Labs Reviewed  CBC - Abnormal; Notable for the following:    RBC 2.81 (*)    Hemoglobin 8.0 (*)    HCT 25.4 (*)    RDW 16.4 (*)    All other components within normal limits  BASIC METABOLIC PANEL - Abnormal; Notable for the following:    Glucose, Bld 175 (*)    BUN 29 (*)    Creatinine, Ser 1.82 (*)    GFR calc non Af Amer 27 (*)    GFR calc Af Amer 32 (*)    All other components within normal limits  BRAIN NATRIURETIC PEPTIDE - Abnormal; Notable for the following:    B Natriuretic Peptide 2584.7 (*)    All other components within normal limits  I-STAT TROPOININ, ED    Imaging Review Dg Chest Port 1 View  10/07/2014   CLINICAL DATA:  Shortness of breath  EXAM: PORTABLE CHEST - 1 VIEW  COMPARISON:  None available  FINDINGS: Previous CABG. Interstitial and airspace opacities in both lung bases, right greater than left. Small pleural effusions. Mild cardiomegaly. Atheromatous aorta. No pneumothorax.  IMPRESSION: 1. Mild cardiomegaly with bibasilar infiltrate/edema and small effusions, suspect CHF.   Electronically Signed   By: Oley Balmaniel  Hassell M.D.   On: 10/07/2014 13:33     EKG Interpretation   Date/Time:  Wednesday October 07 2014 12:48:30 EST Ventricular Rate:  47 PR Interval:  72 QRS Duration: 102 QT Interval:  516 QTC Calculation: 456 R Axis:   146 Text Interpretation:  Sinus bradycardia Short PR interval Anteroseptal  infarct, age indeterminate Lateral leads are  also involved No old tracing  to compare Confirmed by [facility] LtdWENTZ  MD, Xayne Brumbaugh [phone]([id number]) on 10/07/2014 1:19:01 PM      MDM   Final diagnoses:  Dyspnea  Acute congestive heart failure, unspecified congestive heart failure type  Anemia, unspecified anemia type  Renal insufficiency  Cardiac murmur    Congestive heart failure, with anemia and renal insufficiency.  No comparative lab data is available, she is new to the [place].  Unknown cardiac ejection fraction.  She will need to be admitted for stabilization.  Nursing Notes Reviewed/ Care Coordinated, and agree without changes. Applicable Imaging Reviewed.  Interpretation of Laboratory Data incorporated into ED treatment  Plan: Admit  Flint MelterElliott L Estanislao Harmon, MD 10/08/14 1558

## 2014-10-07 NOTE — Progress Notes (Signed)
Patient admitted to [facility] at 1900.  Placed on telemetry.  Blood pressure high at 177/49.  Patient is blind from glaucoma and has been oriented to the room.  Soft button for nurses station call ordered.  Patient has a 14 french foley on arrival to the unit.  No complaints of pain.  States that she is cold.  2 Warm blankets given.

## 2014-10-07 NOTE — ED Notes (Signed)
Pt given 8oz of apple juice per RN.

## 2014-10-08 ENCOUNTER — Inpatient Hospital Stay (HOSPITAL_COMMUNITY): Payer: Medicare PPO

## 2014-10-08 DIAGNOSIS — I5043 Acute on chronic combined systolic (congestive) and diastolic (congestive) heart failure: Secondary | ICD-10-CM | POA: Diagnosis not present

## 2014-10-08 DIAGNOSIS — I2510 Atherosclerotic heart disease of native coronary artery without angina pectoris: Secondary | ICD-10-CM | POA: Insufficient documentation

## 2014-10-08 DIAGNOSIS — R001 Bradycardia, unspecified: Secondary | ICD-10-CM

## 2014-10-08 DIAGNOSIS — D649 Anemia, unspecified: Secondary | ICD-10-CM | POA: Insufficient documentation

## 2014-10-08 DIAGNOSIS — I509 Heart failure, unspecified: Secondary | ICD-10-CM

## 2014-10-08 DIAGNOSIS — I10 Essential (primary) hypertension: Secondary | ICD-10-CM | POA: Insufficient documentation

## 2014-10-08 DIAGNOSIS — J9601 Acute respiratory failure with hypoxia: Secondary | ICD-10-CM

## 2014-10-08 LAB — BASIC METABOLIC PANEL
Anion gap: 9 (ref 5–15)
BUN: 29 mg/dL — ABNORMAL HIGH (ref 6–23)
CHLORIDE: 104 mmol/L (ref 96–112)
CO2: 25 mmol/L (ref 19–32)
Calcium: 8.6 mg/dL (ref 8.4–10.5)
Creatinine, Ser: 1.84 mg/dL — ABNORMAL HIGH (ref 0.50–1.10)
GFR calc Af Amer: 31 mL/min — ABNORMAL LOW (ref >90)
GFR, EST NON AFRICAN AMERICAN: 27 mL/min — AB (ref >90)
GLUCOSE: 121 mg/dL — AB (ref 70–99)
Potassium: 4.1 mmol/L (ref 3.5–5.1)
SODIUM: 138 mmol/L (ref 135–145)

## 2014-10-08 LAB — GLUCOSE, CAPILLARY
Glucose-Capillary: 78 mg/dL (ref 70–99)
Glucose-Capillary: 77 mg/dL (ref 70–99)
Glucose-Capillary: 127 mg/dL — ABNORMAL HIGH (ref 70–99)
Glucose-Capillary: 147 mg/dL — ABNORMAL HIGH (ref 70–99)

## 2014-10-08 LAB — URINE CULTURE

## 2014-10-08 LAB — TSH: TSH: 10.329 u[IU]/mL — ABNORMAL HIGH (ref 0.350–4.500)

## 2014-10-08 LAB — HEMOGLOBIN A1C
HEMOGLOBIN A1C: 6.6 % — AB (ref 4.8–5.6)
MEAN PLASMA GLUCOSE: 143 mg/dL

## 2014-10-08 LAB — TROPONIN I: Troponin I: <0.03 ng/mL (ref <0.031)

## 2014-10-08 LAB — MRSA PCR SCREENING: MRSA by PCR: POSITIVE — AB

## 2014-10-08 MED ORDER — HYDRALAZINE HCL 20 MG/ML IJ SOLN
5.0000 mg | INTRAMUSCULAR | Status: DC | PRN
  Administered 2014-10-09: 5 mg via INTRAVENOUS
  Filled 2014-10-08: qty 1

## 2014-10-08 MED ORDER — MUPIROCIN 2 % EX OINT
1.0000 "application " | TOPICAL_OINTMENT | Freq: Two times a day (BID) | CUTANEOUS | Status: DC
  Administered 2014-10-08 – 2014-10-10 (×5): 1 via NASAL
  Filled 2014-10-08: qty 22

## 2014-10-08 MED ORDER — CHLORHEXIDINE GLUCONATE CLOTH 2 % EX PADS
6.0000 | MEDICATED_PAD | Freq: Every day | CUTANEOUS | Status: DC
  Administered 2014-10-08 – 2014-10-10 (×3): 6 via TOPICAL

## 2014-10-08 MED ORDER — ASPIRIN EC 81 MG PO TBEC
81.0000 mg | DELAYED_RELEASE_TABLET | Freq: Every day | ORAL | Status: DC
  Administered 2014-10-09 – 2014-10-10 (×2): 81 mg via ORAL
  Filled 2014-10-08 (×2): qty 1

## 2014-10-08 MED ORDER — INFLUENZA VAC SPLIT QUAD 0.5 ML IM SUSY
0.5000 mL | PREFILLED_SYRINGE | Freq: Once | INTRAMUSCULAR | Status: AC
  Administered 2014-10-08: 0.5 mL via INTRAMUSCULAR
  Filled 2014-10-08: qty 0.5

## 2014-10-08 NOTE — Progress Notes (Signed)
  Echocardiogram 2D Echocardiogram has been performed.  Taylor Padilla, Taylor Padilla 10/08/2014, 6:03 PM

## 2014-10-08 NOTE — Progress Notes (Signed)
[facility] PROGRESS NOTE  Taylor Padilla ZOX:[id number] DOB: 07/03/1946 DOA: 10/07/2014 PCP: No PCP Per Patient  Assessment/Plan: 1. Acute respiratory failure with diastolic chf failure 1. Pt is continued on BID IV lasix 2. Pt has noted improvement overnight 3. Cardiology is following 4. 2d echo ordered, pending 5. Follow i/o and daily weights 2. Hypothyroid 1. TSH of 10 2. Free T4 ordered, pending 3. On thyroid replacement, dose adjustment pending above 3. CAD 1. Stable 2. Denies chest pains 4. Bradycardia 1. Stable overnight 2. Pt asymptomatic 5. DM2 1. Glucose stable overnight 2. Continue on SSI coverage 6. HTN 1. Suboptimally controlled 2. TID hydralazine started on 2/3 7. CKD2 1. Current renal function stable at baseline 2. Continue to monitor renal function 8. HLD 1. On lipitor  daily 9. GERD 1. Continued on protonix 10. DVT prophylaxis 1. Heparin subQ  Code Status: Full Family Communication: Pt in room (indicate person spoken with, relationship, and if by phone, the number) Disposition Plan: Pending   Consultants:  Cardiology  Procedures:    Antibiotics:   (indicate start date, and stop date if known)  HPI/Subjective: No acute events noted overnight. Pt reports feeling better  Objective: Filed Vitals:   10/07/14 1915 10/08/14 0457 10/08/14 1045 10/08/14 1451  BP: 177/49 160/52 164/68 170/97  Pulse: 50 53  59  Temp: 98.4 F (36.9 C) 98.4 F (36.9 C)  98.1 F (36.7 C)  TempSrc: Oral Oral  Oral  Resp: Height:  (1.549 m)     Weight: 71.4 kg (157 lb 6.5 oz) 70.4 kg (155 lb 3.3 oz)    SpO2: 96% 98%  96%    Intake/Output Summary (Last 24 hours) at 10/08/14 1742 Last data filed at 10/08/14 1300  Gross per 24 hour  Intake    420 ml  Output   3450 ml  Net  -3030 ml   Filed Weights   10/07/14 1248 10/07/14 1915 10/08/14 0457  Weight: 67.586 kg (149 lb) 71.4 kg (157 lb 6.5 oz) 70.4 kg (155 lb 3.3 oz)     Exam:   General:  Awake, in nad  Cardiovascular: regular, s1, s2  Respiratory: normal resp effort, no wheezing  Abdomen: soft,nondistended  Musculoskeletal: perfused, no clubbing   Data Reviewed: Basic Metabolic Panel:  Recent Labs Lab 10/07/14 1327 10/08/14 0116  NA 138 138  K 3.9 4.1  CL 105 104  CO2 26 25  GLUCOSE 175* 121*  BUN 29* 29*  CREATININE 1.82* 1.84*  CALCIUM 8.5 8.6   Liver Function Tests: No results for input(s): AST, ALT, ALKPHOS, BILITOT, PROT, ALBUMIN in the last 168 hours. No results for input(s): LIPASE, AMYLASE in the last 168 hours. No results for input(s): AMMONIA in the last 168 hours. CBC:  Recent Labs Lab 10/07/14 1327  WBC 7.4  HGB 8.0*  HCT 25.4*  MCV 90.4  PLT 263   Cardiac Enzymes:  Recent Labs Lab 10/07/14 2000 10/08/14 0116 10/08/14 0744  TROPONINI <0.03 <0.03 <0.03   BNP (last 3 results)  Recent Labs  10/07/14 1327  BNP 2584.7*    ProBNP (last 3 results) No results for input(s): PROBNP in the last 8760 hours.  CBG:  Recent Labs Lab 10/07/14 2106 10/08/14 0651 10/08/14 1122 10/08/14 1631  GLUCAP 218* 78 77 127*    Recent Results (from the past 240 hour(s))  MRSA PCR Screening     Status: Abnormal   Collection Time: 10/07/14  8:59 PM  Result Value  Ref Range Status   MRSA by PCR POSITIVE (A) NEGATIVE Final    Comment:        The GeneXpert MRSA Assay (FDA approved for NASAL specimens only), is one component of a comprehensive MRSA colonization surveillance program. It is not intended to diagnose MRSA infection nor to guide or monitor treatment for MRSA infections. RESULT CALLED TO, READ BACK BY AND VERIFIED WITH: H.SAYLES,RN AT 0025 10/08/14 BY L.PITT      Studies: Dg Chest Port 1 View  10/08/2014   CLINICAL DATA:  CHF  EXAM: PORTABLE CHEST - 1 VIEW  COMPARISON:  10/07/2014.  FINDINGS: Cardiomegaly with normal pulmonary vascularity. Prior CABG. Interim partial clearing of bilateral  pulmonary infiltrates and pleural effusions consistent consistent with partial clearing of CHF. No pneumothorax. No acute osseus abnormality.  IMPRESSION: Partial clearing of congestive heart failure with partial clearing of bilateral pulmonary edema and bilateral pleural effusions. Prior CABG.   Electronically Signed   By: Maisie Fushomas  Register   On: 10/08/2014 08:08   Dg Chest Port 1 View  10/07/2014   CLINICAL DATA:  Shortness of breath  EXAM: PORTABLE CHEST - 1 VIEW  COMPARISON:  None available  FINDINGS: Previous CABG. Interstitial and airspace opacities in both lung bases, right greater than left. Small pleural effusions. Mild cardiomegaly. Atheromatous aorta. No pneumothorax.  IMPRESSION: 1. Mild cardiomegaly with bibasilar infiltrate/edema and small effusions, suspect CHF.   Electronically Signed   By: Oley Balmaniel  Hassell M.D.   On: 10/07/2014 13:33    Scheduled Meds: . amLODipine  10 mg Oral Daily  . ARIPiprazole  2 mg Oral Daily  . [START ON 10/09/2014] aspirin EC  81 mg Oral Daily  . atorvastatin  80 mg Oral QHS  . carvedilol  6.25 mg Oral BID WC  . Chlorhexidine Gluconate Cloth  6 each Topical Q0600  . clopidogrel  75 mg Oral Daily  . dorzolamide-timolol  1 drop Both Eyes Q12H  . furosemide  60 mg Intravenous BID  . heparin  5,000 Units Subcutaneous 3 times per day  . hydrALAZINE  50 mg Oral 3 times per day  . insulin aspart  0-5 Units Subcutaneous QHS  . insulin aspart  0-9 Units Subcutaneous TID WC  . insulin glargine  20 Units Subcutaneous BID  . isosorbide dinitrate  10 mg Oral TID  . levothyroxine  88 mcg Oral QAC breakfast  . mupirocin ointment  1 application Nasal BID  . pantoprazole  20 mg Oral Daily  . polyvinyl alcohol  1 drop Both Eyes BID  . potassium chloride  20 mEq Oral BID  . prednisoLONE acetate  1 drop Left Eye QID  . sertraline  200 mg Oral QHS  . sodium chloride  3 mL Intravenous Q12H  . traMADol  100 mg Oral Q12H   Continuous Infusions:   Principal Problem:    SOB (shortness of breath) Active Problems:   Diabetes mellitus without complication   Hypertension   CAD (coronary artery disease)   CKD (chronic kidney disease), stage II   Hypothyroidism   Dyslipidemia   GERD (gastroesophageal reflux disease)   Acute respiratory failure with hypoxia   Bradycardia   Anemia   CHF (congestive heart failure)   CHIU, STEPHEN K  [facility] Pager [phone]. If 7PM-7AM, please contact night-coverage at [email or web address], password [email or web address] 10/08/2014, 5:42 PM  LOS: 1 day

## 2014-10-08 NOTE — Progress Notes (Addendum)
Subjective: Breathing better.    Objective: Vital signs in last 24 hours: Temp:  [97.9 F (36.6 C)-98.5 F (36.9 C)] 98.4 F (36.9 C) (02/04 0457) Pulse Rate:  [46-54] 53 (02/04 0457) Resp:  [14-24] 18 (02/04 0457) BP: (160-183)/(45-58) 160/52 mmHg (02/04 0457) SpO2:  [89 %-98 %] 98 % (02/04 0457) Weight:  [149 lb (67.586 kg)-157 lb 6.5 oz (71.4 kg)] 155 lb 3.3 oz (70.4 kg) (02/04 0457) Last BM Date: 10/07/14  Intake/Output from previous day: 02/03 0701 - 02/04 0700 In: 420 [P.O.:420] Out: 2450 [Urine:2450] Intake/Output this shift: Total I/O In: 0  Out: 950 [Urine:950]  Medications Current Facility-Administered Medications  Medication Dose Route Frequency Provider Last Rate Last Dose  . 0.9 %  sodium chloride infusion  250 mL Intravenous PRN Russella Dar, NP      . acetaminophen (TYLENOL) tablet 650 mg  650 mg Oral Q4H PRN Russella Dar, NP      . amLODipine (NORVASC) tablet 10 mg  10 mg Oral Daily Russella Dar, NP      . ARIPiprazole (ABILIFY) tablet 2 mg  2 mg Oral Daily Russella Dar, NP      . aspirin tablet 325 mg  325 mg Oral Daily Russella Dar, NP      . atorvastatin (LIPITOR) tablet 80 mg  80 mg Oral QHS Russella Dar, NP   80 mg at 10/07/14 2130  . carvedilol (COREG) tablet 6.25 mg  6.25 mg Oral BID WC Leroy Sea, MD   6.25 mg at 10/08/14 1610  . Chlorhexidine Gluconate Cloth 2 % PADS 6 each  6 each Topical Q0600 Leroy Sea, MD   6 each at 10/08/14 0600  . clopidogrel (PLAVIX) tablet 75 mg  75 mg Oral Daily Russella Dar, NP      . dorzolamide-timolol (COSOPT) 22.3-6.8 MG/ML ophthalmic solution 1 drop  1 drop Both Eyes Q12H Russella Dar, NP   1 drop at 10/07/14 2135  . furosemide (LASIX) injection 60 mg  60 mg Intravenous BID Leroy Sea, MD   60 mg at 10/08/14 0801  . guaiFENesin-dextromethorphan (ROBITUSSIN DM) 100-10 MG/5ML syrup 5 mL  5 mL Oral Q4H PRN Leroy Sea, MD   5 mL at 10/07/14 2209  . heparin injection  5,000 Units  5,000 Units Subcutaneous 3 times per day Russella Dar, NP   5,000 Units at 10/08/14 [phone]  . hydrALAZINE (APRESOLINE) injection 5 mg  5 mg Intravenous Q4H PRN Jerald Kief, MD      . hydrALAZINE (APRESOLINE) tablet 50 mg  50 mg Oral 3 times per day Leroy Sea, MD   50 mg at 10/08/14 5409  . Influenza vac split quadrivalent PF (FLUARIX) injection 0.5 mL  0.5 mL Intramuscular Once Leroy Sea, MD      . insulin aspart (novoLOG) injection 0-5 Units  0-5 Units Subcutaneous QHS Leroy Sea, MD   0 Units at 10/07/14 2209  . insulin aspart (novoLOG) injection 0-9 Units  0-9 Units Subcutaneous TID WC Leroy Sea, MD   0 Units at 10/08/14 0700  . insulin glargine (LANTUS) injection 20 Units  20 Units Subcutaneous BID Russella Dar, NP   20 Units at 10/07/14 2209  . isosorbide dinitrate (ISORDIL) tablet 10 mg  10 mg Oral TID Azalee Course, PA   10 mg at 10/07/14 2129  . levothyroxine (SYNTHROID, LEVOTHROID) tablet 88 mcg  88 mcg Oral QAC  breakfast Russella Dar, NP   88 mcg at 10/08/14 7829  . mupirocin ointment (BACTROBAN) 2 % 1 application  1 application Nasal BID Leroy Sea, MD   1 application at 10/08/14 0045  . ondansetron (ZOFRAN) injection 4 mg  4 mg Intravenous Q6H PRN Russella Dar, NP      . pantoprazole (PROTONIX) EC tablet 20 mg  20 mg Oral Daily Russella Dar, NP      . polyvinyl alcohol (LIQUIFILM TEARS) 1.4 % ophthalmic solution 1 drop  1 drop Both Eyes BID Russella Dar, NP   1 drop at 10/07/14 2134  . potassium chloride SA (K-DUR,KLOR-CON) CR tablet 20 mEq  20 mEq Oral BID Russella Dar, NP   20 mEq at 10/07/14 2132  . prednisoLONE acetate (PRED FORTE) 1 % ophthalmic suspension 1 drop  1 drop Left Eye QID Russella Dar, NP   1 drop at 10/07/14 2134  . sertraline (ZOLOFT) tablet 200 mg  200 mg Oral QHS Russella Dar, NP   200 mg at 10/07/14 2131  . sodium chloride 0.9 % injection 3 mL  3 mL Intravenous Q12H Russella Dar, NP   3 mL at  10/07/14 2209  . sodium chloride 0.9 % injection 3 mL  3 mL Intravenous PRN Russella Dar, NP      . traMADol Janean Sark) tablet 100 mg  100 mg Oral Q12H Russella Dar, NP   100 mg at 10/07/14 2132    PE: General appearance: alert, cooperative and no distress Lungs: Mild rales.  no wheezing/rhonchi Heart: regular rate and rhythm and 2/6 sys MM LSB Abdomen: +BS, nontender, no distention Extremities: 1+ LEE Pulses: 2+ and symmetric Skin: Warm and dry Neurologic: Grossly normal  Lab Results:   Recent Labs  10/07/14 1327  WBC 7.4  HGB 8.0*  HCT 25.4*  PLT 263   BMET  Recent Labs  10/07/14 1327 10/08/14 0116  NA 138 138  K 3.9 4.1  CL 105 104  CO2 26 25  GLUCOSE 175* 121*  BUN 29* 29*  CREATININE 1.82* 1.84*  CALCIUM 8.5 8.6      Assessment/Plan  70 year old Caucasian female with past medical history of HTN, DM, glaucoma with severely diminished vision, chronic kidney disease, anemia, and CAD s/p 4v CABG in 2003 in [place] who presented with HF symptoms.    Acute on chronic systolic CHF Breating better.  Net fluids:  -2L.  CXR today: Partial clearing of congestive heart failure with partial clearing of bilateral pulmonary edema and bilateral pleural effusions.  SP two dose of  IV lasix and now on IV lasix  BID.  SCr stable.  K+ WNL.  Echo pending.  Continue current dose.  Reevaluate in AM.    SOB (shortness of breath)  improved   Diabetes mellitus without complication   Hypertension  Poorly controlled.  Amlodpine 10, coreg 6.25 bid, hydralazine 50 Q8(increase to  today), isodril 10 tid.    CAD (coronary artery disease)  ASA, plavix   CKD (chronic kidney disease), stage II  Holding ARB.  Renal artery dopplers pending   Hypothyroidism  TSH 10.3.29.  On home dose synthroid.  Per IM   Dyslipidemia  On statin   GERD (gastroesophageal reflux disease)   Acute respiratory failure with hypoxia   Bradycardia  Do not increase coreg.   Anemia      LOS: 1 day    HAGER, BRYAN PA-C 10/08/2014 10:08 AM  Patient seen and examined. Agree with assessment and plan. Very pleasant 69 yo WF who is s/p CABG x4 at [facility] in 2003 who presented with acute on chronic heart failure. Good diuresis with -2900 net output. Initial BNP >2500. On nitrates/hydralazine, iv lasix. Echo to be done today. No chest pain. May need ischemic assessment with nuclear study > 12 yrs s/p CABG once stable. TSH elevated; will need thyroid dose adjustment.  Lennette Biharihomas A. Harlene Petralia, MD, [facility]tonFACC 10/08/2014 10:53 AM

## 2014-10-08 NOTE — Evaluation (Signed)
Occupational Therapy Evaluation and Discharge Patient Details Name: Valentino HueDorcas Dewitt MRN: [id number] DOB: 24-Feb-1946 Today's Date: 10/08/2014    History of Present Illness Pt admitted with shortness of breath. PMHx:DM, HTN, legally blind, CKD, CAD with CABG   Clinical Impression   This 69 yo female admitted with above presents to acute OT at a Mod I level if she were in her own environment which she knows, but here she is S when she is up in her room due to her legal blindness. No mobility issues noted, pt just not familiar with set up--made PT aware. Acute OT will sign off.    Follow Up Recommendations  No OT follow up    Equipment Recommendations  None recommended by OT       Precautions / Restrictions Precautions Precaution Comments: legally blind Restrictions Weight Bearing Restrictions: No      Mobility Bed Mobility Overal bed mobility: Modified Independent                Transfers Overall transfer level: Supervision Equipment used: Rolling walker (2 wheeled)                       ADL Overall ADL's : Supervision                                              Vision  legally blind                          Pertinent Vitals/Pain Pain Assessment: No/denies pain     Hand Dominance Right   Extremity/Trunk Assessment Upper Extremity Assessment Upper Extremity Assessment: Overall WFL for tasks assessed   Lower Extremity Assessment Lower Extremity Assessment: Overall WFL for tasks assessed       Communication Communication Communication: No difficulties   Cognition Arousal/Alertness: Awake/alert Behavior During Therapy: WFL for tasks assessed/performed Overall Cognitive Status: Within Functional Limits for tasks assessed                                Home Living Family/patient expects to be discharged to:: Assisted living                             Home Equipment: Walker - 2 wheels    Additional Comments: They do her meds and she goes to the dining room for meals, otherwise she does all her basic self care      Prior Functioning/Environment Level of Independence: Independent with assistive device(s)             OT Diagnosis: Generalized weakness         OT Goals(Current goals can be found in the care plan section) Acute Rehab OT Goals Patient Stated Goal: to go back to A'd living  OT Frequency:                End of Session Equipment Utilized During Treatment: Rolling walker Nurse Communication: Mobility status  Activity Tolerance: Patient tolerated treatment well Patient left: in bed;with call bell/phone within reach   Time: 1525-1540 OT Time Calculation (min): 15 min Charges:  OT General Charges $OT Visit: 1 Procedure OT Evaluation $Initial OT Evaluation Tier I: 1 Procedure  Evette GeorgesLeonard, Jered Heiny Eva [phone]  10/08/2014, 4:08 PM

## 2014-10-08 NOTE — Clinical Social Work Psychosocial (Signed)
     Clinical Social Work Department BRIEF PSYCHOSOCIAL ASSESSMENT 10/08/2014  Patient:  Taylor Padilla, Taylor Padilla     Account Number:  [id number]     Admit date:  10/07/2014  Clinical Social Worker:  Elam Dutch  Date/Time:  10/08/2014 05:30 PM  Referred by:  Physician  Date Referred:  10/08/2014 Referred for  Other - See comment   Other Referral:   Return to ALF?   Interview type:  Other - See comment Other interview type:   Patient and telephone call to patient's daughter-in-law [name]    PSYCHOSOCIAL DATA Living Status:  FACILITY Admitted from facility:  Other Level of care:  Assisted Living Primary support name:  [name] [phone] Primary support relationship to patient:  FAMILY Degree of support available:   Daughter-in-law    CURRENT CONCERNS Current Concerns  Other - See comment   Other Concerns:   Return to [facility] / PLAN 69 year old female admitted from [facility] on [address]. She was assessed by PT today and it as determined that she could return to the ALF for continued care when medically stable.CSW met with patient- she states that she enjoys living at [facility] and that she feels well cared for at the facility. She wants to return there when medically stable.  Patient's daughter in law stated a desire for patient to return there as well.  Fl2 placed on chart for MD's signature. Will monitor for date of stability.   Assessment/plan status:  Psychosocial Support/Ongoing Assessment of Needs Other assessment/ plan:   Information/referral to community resources:   None at this time. Maybe benefit from [place] [facility] at the facility.    PATIENTS/FAMILYS RESPONSE TO PLAN OF CARE: Patient is alert and oriented- extremely pleasant lady noted to be sitting up in bed having dinner at time of CSW's visit.  She was very welcoming to [facility] and invited a continued conversation while she ate. Patient stated "I am  blind" and requested that the lights in her room be adjusted.  She stated that she can see some shapes but that is all.  Patient states that she is quite functional at the facility- walks with a walker and can maneuver the halls- going to the dining room etc.  She was very quick to point out that while it is ok for CSW to talk to her family- she is her own decision maker in all health care issues.  She is anxious to return to facility when medically stable and was pleased when notified that she would be able to return to [facility] when stable.  CSW also spoke to [place] at [place] [facility] who indicated a desire to accept patient back when medically stable.

## 2014-10-08 NOTE — Progress Notes (Signed)
MD:  Patient has a PCP- Dr. Florentina JennyHenry Tripp [facility]. This is the ALF's Doctor. I hope this helps.  Thanks! Lorri Frederickonna T. Jaci LazierCrowder, KentuckyLCSW [phone]

## 2014-10-08 NOTE — Progress Notes (Signed)
PT Cancellation Note  Patient Details Name: Taylor Padilla MRN: [id number] DOB: 03-Nov-1945   Cancelled Treatment:    Reason Eval/Treat Not Completed: PT screened, no needs identified, will sign off. Per OT patient at baseline with no acute therapy needs at this time.   Fabio AsaWerner, Amal Saiki J 10/08/2014, 4:21 PM Charlotte Crumbevon Ginni Eichler, PT DPT  [phone]

## 2014-10-09 DIAGNOSIS — I509 Heart failure, unspecified: Secondary | ICD-10-CM

## 2014-10-09 DIAGNOSIS — I10 Essential (primary) hypertension: Secondary | ICD-10-CM

## 2014-10-09 LAB — CBC
HCT: 27.2 % — ABNORMAL LOW (ref 36.0–46.0)
Hemoglobin: 8.5 g/dL — ABNORMAL LOW (ref 12.0–15.0)
MCH: 29.2 pg (ref 26.0–34.0)
MCHC: 31.3 g/dL (ref 30.0–36.0)
MCV: 93.5 fL (ref 78.0–100.0)
Platelets: 266 10*3/uL (ref 150–400)
RBC: 2.91 MIL/uL — AB (ref 3.87–5.11)
RDW: 16.3 % — AB (ref 11.5–15.5)
WBC: 7.9 10*3/uL (ref 4.0–10.5)

## 2014-10-09 LAB — GLUCOSE, CAPILLARY
GLUCOSE-CAPILLARY: 154 mg/dL — AB (ref 70–99)
GLUCOSE-CAPILLARY: 108 mg/dL — AB (ref 70–99)
Glucose-Capillary: 231 mg/dL — ABNORMAL HIGH (ref 70–99)
Glucose-Capillary: 325 mg/dL — ABNORMAL HIGH (ref 70–99)

## 2014-10-09 LAB — BASIC METABOLIC PANEL
Anion gap: 11 (ref 5–15)
BUN: 29 mg/dL — ABNORMAL HIGH (ref 6–23)
CALCIUM: 8.6 mg/dL (ref 8.4–10.5)
CO2: 27 mmol/L (ref 19–32)
Chloride: 102 mmol/L (ref 96–112)
Creatinine, Ser: 1.92 mg/dL — ABNORMAL HIGH (ref 0.50–1.10)
GFR calc Af Amer: 30 mL/min — ABNORMAL LOW (ref >90)
GFR, EST NON AFRICAN AMERICAN: 26 mL/min — AB (ref >90)
Glucose, Bld: 158 mg/dL — ABNORMAL HIGH (ref 70–99)
POTASSIUM: 4.3 mmol/L (ref 3.5–5.1)
SODIUM: 140 mmol/L (ref 135–145)

## 2014-10-09 LAB — T4, FREE: Free T4: 1.02 ng/dL (ref 0.80–1.80)

## 2014-10-09 MED ORDER — LEVOTHYROXINE SODIUM 100 MCG PO TABS
100.0000 ug | ORAL_TABLET | Freq: Every day | ORAL | Status: DC
  Administered 2014-10-10: 100 ug via ORAL
  Filled 2014-10-09 (×3): qty 1

## 2014-10-09 MED ORDER — HYDRALAZINE HCL 25 MG PO TABS
75.0000 mg | ORAL_TABLET | Freq: Three times a day (TID) | ORAL | Status: DC
  Administered 2014-10-09 – 2014-10-10 (×4): 75 mg via ORAL
  Filled 2014-10-09 (×6): qty 1

## 2014-10-09 MED ORDER — CARVEDILOL 12.5 MG PO TABS
12.5000 mg | ORAL_TABLET | Freq: Two times a day (BID) | ORAL | Status: DC
  Administered 2014-10-09 – 2014-10-10 (×2): 12.5 mg via ORAL
  Filled 2014-10-09 (×4): qty 1

## 2014-10-09 NOTE — Progress Notes (Signed)
[facility] stating that she will be up at the bedside to do pt's Renal US.  Amanda PeaNellie Dawson Hollman, Charity fundraiserN.

## 2014-10-09 NOTE — Progress Notes (Signed)
Subjective: Breathing much better.    Objective: Vital signs in last 24 hours: Temp:  [98 F (36.7 C)-98.9 F (37.2 C)] 98.9 F (37.2 C) (02/05 0900) Pulse Rate:  [52-61] 61 (02/05 0900) Resp:  [18] 18 (02/05 0900) BP: (154-200)/(51-97) 170/68 mmHg (02/05 0900) SpO2:  [96 %-100 %] 100 % (02/05 0900) Weight:  [146 lb 9.6 oz (66.497 kg)] 146 lb 9.6 oz (66.497 kg) (02/05 0300) Last BM Date: 10/07/14  Intake/Output from previous day: 02/04 0701 - 02/05 0700 In: 780 [P.O.:780] Out: 4800 [Urine:4800] Intake/Output this shift: Total I/O In: 0  Out: 900 [Urine:900]  Medications Current Facility-Administered Medications  Medication Dose Route Frequency Provider Last Rate Last Dose  . 0.9 %  sodium chloride infusion  250 mL Intravenous PRN Russella Dar, NP      . acetaminophen (TYLENOL) tablet 650 mg  650 mg Oral Q4H PRN Russella Dar, NP      . amLODipine (NORVASC) tablet 10 mg  10 mg Oral Daily Russella Dar, NP   10 mg at 10/09/14 1051  . ARIPiprazole (ABILIFY) tablet 2 mg  2 mg Oral Daily Russella Dar, NP   2 mg at 10/09/14 1053  . aspirin EC tablet 81 mg  81 mg Oral Daily Dwana Melena, PA-C   81 mg at 10/09/14 1051  . atorvastatin (LIPITOR) tablet 80 mg  80 mg Oral QHS Russella Dar, NP   80 mg at 10/08/14 2132  . carvedilol (COREG) tablet 12.5 mg  12.5 mg Oral BID WC Jerald Kief, MD      . Chlorhexidine Gluconate Cloth 2 % PADS 6 each  6 each Topical Q0600 Leroy Sea, MD   6 each at 10/09/14 [phone]  . clopidogrel (PLAVIX) tablet 75 mg  75 mg Oral Daily Russella Dar, NP   75 mg at 10/09/14 1051  . dorzolamide-timolol (COSOPT) 22.3-6.8 MG/ML ophthalmic solution 1 drop  1 drop Both Eyes Q12H Russella Dar, NP   1 drop at 10/09/14 1054  . furosemide (LASIX) injection 60 mg  60 mg Intravenous BID Leroy Sea, MD   60 mg at 10/09/14 1048  . guaiFENesin-dextromethorphan (ROBITUSSIN DM) 100-10 MG/5ML syrup 5 mL  5 mL Oral Q4H PRN Leroy Sea, MD   5  mL at 10/07/14 2209  . heparin injection 5,000 Units  5,000 Units Subcutaneous 3 times per day Russella Dar, NP   5,000 Units at 10/09/14 [phone]  . hydrALAZINE (APRESOLINE) injection 5 mg  5 mg Intravenous Q4H PRN Jerald Kief, MD   5 mg at 10/09/14 5409  . hydrALAZINE (APRESOLINE) tablet 50 mg  50 mg Oral 3 times per day Leroy Sea, MD   50 mg at 10/09/14 8119  . insulin aspart (novoLOG) injection 0-5 Units  0-5 Units Subcutaneous QHS Leroy Sea, MD   0 Units at 10/07/14 2209  . insulin aspart (novoLOG) injection 0-9 Units  0-9 Units Subcutaneous TID WC Leroy Sea, MD   2 Units at 10/09/14 0707  . insulin glargine (LANTUS) injection 20 Units  20 Units Subcutaneous BID Russella Dar, NP   20 Units at 10/07/14 2209  . isosorbide dinitrate (ISORDIL) tablet 10 mg  10 mg Oral TID Azalee Course, PA   10 mg at 10/09/14 1052  . [START ON 10/10/2014] levothyroxine (SYNTHROID, LEVOTHROID) tablet 100 mcg  100 mcg Oral QAC breakfast Jerald Kief, MD      .  mupirocin ointment (BACTROBAN) 2 % 1 application  1 application Nasal BID Leroy Sea, MD   1 application at 10/09/14 1105  . ondansetron (ZOFRAN) injection 4 mg  4 mg Intravenous Q6H PRN Russella Dar, NP      . pantoprazole (PROTONIX) EC tablet 20 mg  20 mg Oral Daily Russella Dar, NP   20 mg at 10/09/14 1051  . polyvinyl alcohol (LIQUIFILM TEARS) 1.4 % ophthalmic solution 1 drop  1 drop Both Eyes BID Russella Dar, NP   1 drop at 10/09/14 1056  . potassium chloride SA (K-DUR,KLOR-CON) CR tablet 20 mEq  20 mEq Oral BID Russella Dar, NP   20 mEq at 10/09/14 1059  . prednisoLONE acetate (PRED FORTE) 1 % ophthalmic suspension 1 drop  1 drop Left Eye QID Russella Dar, NP   1 drop at 10/09/14 1054  . sertraline (ZOLOFT) tablet 200 mg  200 mg Oral QHS Russella Dar, NP   200 mg at 10/08/14 2129  . sodium chloride 0.9 % injection 3 mL  3 mL Intravenous Q12H Russella Dar, NP   3 mL at 10/09/14 1059  . sodium chloride 0.9 %  injection 3 mL  3 mL Intravenous PRN Russella Dar, NP      . traMADol Janean Sark) tablet 100 mg  100 mg Oral Q12H Russella Dar, NP   100 mg at 10/07/14 2132    PE: General appearance: alert, cooperative and no distress Lungs: Mild rales.  no wheezing/rhonchi Heart: regular rate and rhythm and 2/6 sys MM LSB Abdomen: +BS, nontender, no distention Extremities: 1+ LEE Pulses: 2+ and symmetric Skin: Warm and dry Neurologic: Grossly normal  Lab Results:   Recent Labs  10/07/14 1327 10/09/14 0347  WBC 7.4 7.9  HGB 8.0* 8.5*  HCT 25.4* 27.2*  PLT 263 266   BMET  Recent Labs  10/07/14 1327 10/08/14 0116 10/09/14 0347  NA 138 138 140  K 3.9 4.1 4.3  CL 105 104 102  CO2 GLUCOSE 175* 121* 158*  BUN 29* 29* 29*  CREATININE 1.82* 1.84* 1.92*  CALCIUM 8.5 8.6 8.6    2D ECHO: 10/08/2014 LV EF: 60% -  65% Study Conclusions - Left ventricle: The cavity size was normal. Wall thickness was normal. Systolic function was normal. The estimated ejection fraction was in the range of 60% to 65%. Wall motion was normal; there were no regional wall motion abnormalities. Doppler parameters are consistent with restrictive physiology, indicative of decreased left ventricular diastolic compliance and/or increased left atrial pressure. - Ventricular septum: Septal motion showed paradox. These changes are consistent with a post-thoracotomy state. - Mitral valve: Calcified annulus. Mildly thickened leaflets . There was moderate regurgitation directed centrally. - Left atrium: The atrium was severely dilated. - Right ventricle: The cavity size was mildly dilated. - Right atrium: The atrium was mildly dilated. - Pulmonary arteries: Systolic pressure was mildly to moderately increased. PA peak pressure: 48 mm Hg (S). 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   Assessment/Plan  69 year old Caucasian female with past medical history of HTN, DM,  glaucoma with severely diminished vision, chronic kidney disease, anemia, and CAD s/p 4v CABG in 2003 in [place] who presented with HF symptoms.  Acute on chronic diastolic CHF -- Breating better.  Net fluids: neg ~7L on Lasix  BID. Weight down 3lbs 149-->146 lbs.  -- SCr slightly bumped.  K+ WNL.  Still with crackles at bases of lunhgs. Continue  IV Lasix for one more day and plan to convert to PO tomorrow.  -- 2D ECHO with EF 60-65%, no RWMAs, doppler parameters c/w restrictive physiology. Severely dilated LA, mod MR, mild RV dilation, mild RA dilation, PA pk pressure 48.  -- Continue BB and nitrates/hydralazine  Hypertension- Poorly controlled.   -- She is on Amlodpine 10, coreg 12.5 mg bid, hydralazine 50 Q8 (increase to 75mg  today), isodril 10 tid.   CAD  -- Continue ASA, plavix -- May need ischemic assessment with nuclear study > 12 yrs s/p CABG once stable. Not wanting to proceed with NST but will have MD talk with her.   CKD- stage II -- Holding ARB.  Renal artery dopplers with no evidence of renal artery stenosis. Intrarenal resistive indices above 0.7 (above normal limits) on the left. Incidental finding bilateral cortical renal cysts.  Hypothyroidism -- TSH 10.3.29. TSH elevated; will need thyroid dose adjustment.  Dyslipidemia -- On statin  Bradycardia-- Do not increase coreg. Will increase hydralazine for better BP control     Anemia- H/H 8.5/27.2  Diabetes mellitus without complication- per IM.     LOS: 2 days    Janetta HoraHOMPSON, KATHRYN R PA-C 10/09/2014 11:45 AM    Patient seen and examined. Agree with assessment and plan. Breathing better; Excellent diuresis with net output -6950. BP elevated. Nl systolic fxn with restrictive physiology. Cr 1.92; ARB on hold. No RAS on renal duplex. On nitrates/hydralazine; will titrate hydralazine dose. With CABG in 2003 should ultimately have a pharmacologic nuclear study for ischemic assessment.   Lennette Biharihomas A. Kelly, MD,  [facility] 10/09/2014 12:23 PM

## 2014-10-09 NOTE — Progress Notes (Signed)
[facility] PROGRESS NOTE  Taylor HueDorcas Ayotte GNF:[id number] DOB: [birth year] DOA: 10/07/2014 PCP: No PCP Per Patient  Assessment/Plan: 1. Acute respiratory failure with diastolic chf failure 1. Pt is continued on BID IV lasix 2. Pt has noted continued improvement 3. Cardiology is following 4. Follow i/o and daily weights 5. Thus far, net neg 7.2L 6. Wt 66.49kg from 70.4kg yesterday 2. Hypothyroid 1. TSH of 1 0 2. Free T4 ordered, low normal result 3. Continue thyroid replacement 3. CAD 1. Stable 2. Denies chest pains 4. Bradycardia 1. Stable overnight 2. Pt asymptomatic 5. DM2 1. Glucose stable overnight 2. Continue on SSI coverage 6. HTN 1. Suboptimally controlled 2. TID hydralazine started on 2/3 7. CKD2 1. Current renal function stable at baseline 2. Continue to monitor renal function 8. HLD 1. On lipitor 80mg  daily 9. GERD 1. Continued on protonix 10. DVT prophylaxis 1. Heparin subQ  Code Status: Full Family Communication: Pt in room Disposition Plan: Pending  Consultants:  Cardiology  Procedures:    Antibiotics:    HPI/Subjective: No acute events noted overnight.  Objective: Filed Vitals:   10/09/14 0900 10/09/14 1036 10/09/14 1428 10/09/14 1608  BP: 170/68  180/51 168/60  Pulse: 61 51 59 62  Temp: 98.9 F (37.2 C)  98.2 F (36.8 C)   TempSrc: Oral  Oral   Resp: 18  20   Height:      Weight:      SpO2: 100%  98%     Intake/Output Summary (Last 24 hours) at 10/09/14 1734 Last data filed at 10/09/14 1428  Gross per 24 hour  Intake    460 ml  Output   3500 ml  Net  -3040 ml   Filed Weights   10/07/14 1915 10/08/14 0457 10/09/14 0300  Weight: 71.4 kg (157 lb 6.5 oz) 70.4 kg (155 lb 3.3 oz) 66.497 kg (146 lb 9.6 oz)    Exam:   General:  Awake, in nad  Cardiovascular: regular, s1, s2  Respiratory: normal resp effort, no wheezing  Abdomen: soft,nondistended  Musculoskeletal: perfused, no clubbing   Data Reviewed: Basic  Metabolic Panel:  Recent Labs Lab 10/07/14 1327 10/08/14 0116 10/09/14 0347  NA 138 138 140  K 3.9 4.1 4.3  CL 105 104 102  CO2 26 25 27   GLUCOSE 175* 121* 158*  BUN 29* 29* 29*  CREATININE 1.82* 1.84* 1.92*  CALCIUM 8.5 8.6 8.6   Liver Function Tests: No results for input(s): AST, ALT, ALKPHOS, BILITOT, PROT, ALBUMIN in the last 168 hours. No results for input(s): LIPASE, AMYLASE in the last 168 hours. No results for input(s): AMMONIA in the last 168 hours. CBC:  Recent Labs Lab 10/07/14 1327 10/09/14 0347  WBC 7.4 7.9  HGB 8.0* 8.5*  HCT 25.4* 27.2*  MCV 90.4 93.5  PLT 263 266   Cardiac Enzymes:  Recent Labs Lab 10/07/14 2000 10/08/14 0116 10/08/14 0744  TROPONINI <0.03 <0.03 <0.03   BNP (last 3 results)  Recent Labs  10/07/14 1327  BNP 2584.7*    ProBNP (last 3 results) No results for input(s): PROBNP in the last 8760 hours.  CBG:  Recent Labs Lab 10/08/14 1631 10/08/14 2121 10/09/14 0641 10/09/14 1121 10/09/14 1625  GLUCAP 127* 147* 154* 108* 231*    Recent Results (from the past 240 hour(s))  Urine culture     Status: None   Collection Time: 10/07/14  3:55 PM  Result Value Ref Range Status   Specimen Description URINE, CLEAN CATCH  Final  Special Requests NONE  Final   Colony Count   Final    30,000 COLONIES/ML Performed at [facility]    Culture   Final    Multiple bacterial morphotypes present, none predominant. Suggest appropriate recollection if clinically indicated. Performed at [facility]    Report Status 10/08/2014 FINAL  Final  MRSA PCR Screening     Status: Abnormal   Collection Time: 10/07/14  8:59 PM  Result Value Ref Range Status   MRSA by PCR POSITIVE (A) NEGATIVE Final    Comment:        The GeneXpert MRSA Assay (FDA approved for NASAL specimens only), is one component of a comprehensive MRSA colonization surveillance program. It is not intended to diagnose MRSA infection nor to guide  or monitor treatment for MRSA infections. RESULT CALLED TO, READ BACK BY AND VERIFIED WITH: H.SAYLES,RN AT 0025 10/08/14 BY L.PITT      Studies: Dg Chest Port 1 View  10/08/2014   CLINICAL DATA:  CHF  EXAM: PORTABLE CHEST - 1 VIEW  COMPARISON:  10/07/2014.  FINDINGS: Cardiomegaly with normal pulmonary vascularity. Prior CABG. Interim partial clearing of bilateral pulmonary infiltrates and pleural effusions consistent consistent with partial clearing of CHF. No pneumothorax. No acute osseus abnormality.  IMPRESSION: Partial clearing of congestive heart failure with partial clearing of bilateral pulmonary edema and bilateral pleural effusions. Prior CABG.   Electronically Signed   By: Maisie Fus  Register   On: 10/08/2014 08:08    Scheduled Meds: . amLODipine  10 mg Oral Daily  . ARIPiprazole  2 mg Oral Daily  . aspirin EC  81 mg Oral Daily  . atorvastatin  80 mg Oral QHS  . carvedilol  12.5 mg Oral BID WC  . Chlorhexidine Gluconate Cloth  6 each Topical Q0600  . clopidogrel  75 mg Oral Daily  . dorzolamide-timolol  1 drop Both Eyes Q12H  . furosemide  60 mg Intravenous BID  . heparin  5,000 Units Subcutaneous 3 times per day  . hydrALAZINE  75 mg Oral 3 times per day  . insulin aspart  0-5 Units Subcutaneous QHS  . insulin aspart  0-9 Units Subcutaneous TID WC  . insulin glargine  20 Units Subcutaneous BID  . isosorbide dinitrate  10 mg Oral TID  . [START ON 10/10/2014] levothyroxine  100 mcg Oral QAC breakfast  . mupirocin ointment  1 application Nasal BID  . pantoprazole  20 mg Oral Daily  . polyvinyl alcohol  1 drop Both Eyes BID  . potassium chloride  20 mEq Oral BID  . prednisoLONE acetate  1 drop Left Eye QID  . sertraline  200 mg Oral QHS  . sodium chloride  3 mL Intravenous Q12H  . traMADol  100 mg Oral Q12H   Continuous Infusions:   Principal Problem:   SOB (shortness of breath) Active Problems:   Diabetes mellitus without complication   Hypertension   CAD (coronary  artery disease)   CKD (chronic kidney disease), stage II   Hypothyroidism   Dyslipidemia   GERD (gastroesophageal reflux disease)   Acute respiratory failure with hypoxia   Bradycardia   Anemia   CHF (congestive heart failure)   Absolute anemia   Coronary artery disease involving native coronary artery of native heart without angina pectoris   Essential hypertension   Khamari Sheehan K  [facility] Pager [phone]. If 7PM-7AM, please contact night-coverage at [email or web address], password [facility] 10/09/2014, 5:34 PM  LOS: 2 days

## 2014-10-09 NOTE — Care Management Note (Unsigned)
    Page 1 of 1   10/09/2014     4:48:21 PM CARE MANAGEMENT NOTE 10/09/2014  Patient:  Taylor Padilla,Taylor Padilla   Account Number:  [id number]  Date Initiated:  10/09/2014  Documentation initiated by:  Georgie Eduardo  Subjective/Objective Assessment:   Pt adm on 10/07/14 with CHF exacerbation.  [facility]     Action/Plan:   CSW consulted to faciltiate return to ALF when medically stable for dc.  Will follow progress.   Anticipated DC Date:  10/11/2014   Anticipated DC Plan:  ASSISTED LIVING / REST HOME  In-house referral  Clinical Social Worker      DC Planning Services  CM consult      Choice offered to / List presented to:             Status of service:  In process, will continue to follow Medicare Important Message given?   (If response is "NO", the following Medicare IM given date fields will be blank) Date Medicare IM given:   Medicare IM given by:   Date Additional Medicare IM given:   Additional Medicare IM given by:    Discharge Disposition:    Per UR Regulation:  Reviewed for med. necessity/level of care/duration of stay  If discussed at Long Length of Stay Meetings, dates discussed:    Comments:

## 2014-10-09 NOTE — Progress Notes (Signed)
VASCULAR LAB PRELIMINARY  PRELIMINARY  PRELIMINARY  PRELIMINARY  Renal artery duplex bilateral completed.    Preliminary report: Renal artery / Aorta ratios within normal limits bilaterally.  No evidence of renal artery stenosis.  Intrarenal resistive indices above 0.7 (above normal limits) on the left. Incidental finding bilateral cortical renal cysts.  Redmond Pullingltzroth, Zainab Crumrine F, RVT 10/09/2014, 11:51 AM

## 2014-10-09 NOTE — Plan of Care (Signed)
Problem: Phase I Progression Outcomes Goal: EF % per last Echo/documented,Core Reminder form on chart Outcome: Completed/Met Date Met:  10/09/14 EF 60-65%(10-08-14)

## 2014-10-09 NOTE — Progress Notes (Signed)
Called vascular lab to know the time that pt will be coming down for renal US.  As pt requesting to know.  [employer] by Elmarie Shileyiffany that vascular not answering,  But will let them now and will call me back with the time.  Tyreak Reagle,RN.

## 2014-10-10 ENCOUNTER — Other Ambulatory Visit: Payer: Self-pay | Admitting: Nurse Practitioner

## 2014-10-10 DIAGNOSIS — I5033 Acute on chronic diastolic (congestive) heart failure: Secondary | ICD-10-CM

## 2014-10-10 DIAGNOSIS — I25810 Atherosclerosis of coronary artery bypass graft(s) without angina pectoris: Secondary | ICD-10-CM

## 2014-10-10 DIAGNOSIS — E785 Hyperlipidemia, unspecified: Secondary | ICD-10-CM

## 2014-10-10 LAB — BASIC METABOLIC PANEL
Anion gap: 9 (ref 5–15)
BUN: 27 mg/dL — AB (ref 6–23)
CO2: 26 mmol/L (ref 19–32)
CREATININE: 1.88 mg/dL — AB (ref 0.50–1.10)
Calcium: 8.4 mg/dL (ref 8.4–10.5)
Chloride: 102 mmol/L (ref 96–112)
GFR calc Af Amer: 30 mL/min — ABNORMAL LOW (ref >90)
GFR, EST NON AFRICAN AMERICAN: 26 mL/min — AB (ref >90)
GLUCOSE: 246 mg/dL — AB (ref 70–99)
Potassium: 3.7 mmol/L (ref 3.5–5.1)
Sodium: 137 mmol/L (ref 135–145)

## 2014-10-10 LAB — GLUCOSE, CAPILLARY
GLUCOSE-CAPILLARY: 237 mg/dL — AB (ref 70–99)
Glucose-Capillary: 249 mg/dL — ABNORMAL HIGH (ref 70–99)

## 2014-10-10 MED ORDER — ISOSORBIDE DINITRATE 10 MG PO TABS: 20.0000 mg | ORAL_TABLET | Freq: Three times a day (TID) | ORAL | Status: DC

## 2014-10-10 MED ORDER — CARVEDILOL 12.5 MG PO TABS: 12.5000 mg | ORAL_TABLET | Freq: Two times a day (BID) | ORAL | Status: DC

## 2014-10-10 MED ORDER — FUROSEMIDE 80 MG PO TABS: 80.0000 mg | ORAL_TABLET | Freq: Every day | ORAL | Status: DC

## 2014-10-10 MED ORDER — HYDRALAZINE HCL 25 MG PO TABS: 75.0000 mg | ORAL_TABLET | Freq: Three times a day (TID) | ORAL | Status: DC

## 2014-10-10 MED ORDER — LEVOTHYROXINE SODIUM 100 MCG PO TABS: 100.0000 ug | ORAL_TABLET | Freq: Every day | ORAL | Status: DC

## 2014-10-10 NOTE — Discharge Summary (Signed)
Physician Discharge Summary  Taylor Padilla AVW:[id number] DOB: [birth year] DOA: 10/07/2014  PCP: No PCP Per Patient  Admit date: 10/07/2014 Discharge date: 10/10/2014  Time spent: minutes  Recommendations for Outpatient Follow-up:  1. Follow up with PCP in 1-2 min 2. Follow up with Cardiology in 2 weeks 3. Check BMET in 2 weeks  Discharge Diagnoses:  Principal Problem:   SOB (shortness of breath) Active Problems:   Diabetes mellitus without complication   Hypertension   CAD (coronary artery disease)   CKD (chronic kidney disease), stage II   Hypothyroidism   Dyslipidemia   GERD (gastroesophageal reflux disease)   Acute respiratory failure with hypoxia   Bradycardia   Anemia   CHF (congestive heart failure)   Absolute anemia   Coronary artery disease involving native coronary artery of native heart without angina pectoris   Essential hypertension   Congestive heart disease   Discharge Condition: Improved  Diet recommendation: Diabetic, heart healthy  Filed Weights   10/08/14 0457 10/09/14 0300 10/10/14 0631  Weight: 70.4 kg (155 lb 3.3 oz) 66.497 kg (146 lb 9.6 oz) 65.499 kg (144 lb 6.4 oz)    History of present illness:  Please review h and p from 2/3 for details. Briefly, pt presented with SOB on admit, found to have acutely decompensated CHF. The patient was admitted for further work up.  Hospital Course:  1. Acute respiratory failure with diastolic chf failure 1. Pt is continued on BID IV lasix 2. Pt has noted continued improvement 3. Cardiology is following 4. Follow i/o and daily weights 5. Thus far, net neg 8.4L 6. Wt on discharge 65.4kg 2. Hypothyroid 1. TSH of 1 0 2. Free T4 ordered, low normal result 3. Increased dose of levothyroxine from to 4. Would repeat TSH in 4-6 weeks 3. CAD 1. Stable 2. Denies chest pains 4. Bradycardia 1. Stable 2. Pt asymptomatic 5. DM2 1. Glucose stable overnight 2. Continue on SSI  coverage 6. HTN 1. Suboptimally controlled 2. TID hydralazine started on 2/3 3. See discharge med-rec for discharge meds 7. CKD2 1. Current renal function stable at baseline 2. Continue to monitor renal function 8. HLD 1. On lipitor  daily 9. GERD 1. Continued on protonix 10. DVT prophylaxis 1. Heparin subQ  Consultations:  Cardiology  Discharge Exam: Filed Vitals:   10/09/14 2144 10/10/14 0631 10/10/14 1205 10/10/14 1217  BP: 168/54 156/78 160/70   Pulse: 62 62  66  Temp: 97.8 F (36.6 C) 98.7 F (37.1 C)    TempSrc: Oral Oral    Resp: 18 18    Height:      Weight:  65.499 kg (144 lb 6.4 oz)    SpO2: 97% 96%      General: Awake, in nad Cardiovascular: regular, s1, s2 Respiratory: normal resp effort, no wheezing  Discharge Instructions     Medication List    STOP taking these medications        cloNIDine 0.1 MG tablet  Commonly known as:  CATAPRES     losartan 50 MG tablet  Commonly known as:  COZAAR      TAKE these medications        amLODipine 10 MG tablet  Commonly known as:  NORVASC  Take 10 mg by mouth daily.     ARIPiprazole 2 MG tablet  Commonly known as:  ABILIFY  Take 2 mg by mouth daily.     aspirin 325 MG tablet  Take 325 mg by mouth daily.  atorvastatin 80 MG tablet  Commonly known as:  LIPITOR  Take 80 mg by mouth at bedtime.     carvedilol 12.5 MG tablet  Commonly known as:  COREG  Take 1 tablet (12.5 mg total) by mouth 2 (two) times daily with a meal.     clopidogrel 75 MG tablet  Commonly known as:  PLAVIX  Take 75 mg by mouth daily.     dorzolamide-timolol 22.3-6.8 MG/ML ophthalmic solution  Commonly known as:  COSOPT  Place 1 drop into both eyes every 12 (twelve) hours.     furosemide 80 MG tablet  Commonly known as:  LASIX  Take 1 tablet (80 mg total) by mouth daily. Alternating every other day with      hydrALAZINE 25 MG tablet  Commonly known as:  APRESOLINE  Take 3 tablets (75 mg total) by mouth  every 8 (eight) hours.     insulin aspart 100 UNIT/ML injection  Commonly known as:  novoLOG  Inject 5 Units into the skin 3 (three) times daily with meals.     isosorbide dinitrate 10 MG tablet  Commonly known as:  ISORDIL  Take 2 tablets (20 mg total) by mouth 3 (three) times daily.     lansoprazole 30 MG capsule  Commonly known as:  PREVACID  Take 30 mg by mouth daily.     LANTUS SOLOSTAR 100 UNIT/ML Solostar Pen  Generic drug:  Insulin Glargine  Inject 20 Units into the skin 2 (two) times daily.     levothyroxine 100 MCG tablet  Commonly known as:  SYNTHROID, LEVOTHROID  Take 1 tablet (100 mcg total) by mouth daily before breakfast.     nitroGLYCERIN 2.5 MG CR capsule  Take 2.5 mg by mouth every 12 (twelve) hours as needed. Chest pain     Polyethyl Glycol-Propyl Glycol 0.4-0.3 % Soln  Apply 1 drop to eye 2 (two) times daily.     prednisoLONE acetate 1 % ophthalmic suspension  Commonly known as:  PRED FORTE  Place 1 drop into the left eye 4 (four) times daily.     ranitidine 150 MG tablet  Commonly known as:  ZANTAC  Take 150 mg by mouth 2 (two) times daily as needed. indigestion     sertraline 100 MG tablet  Commonly known as:  ZOLOFT  Take 200 mg by mouth at bedtime.     traMADol 50 MG tablet  Commonly known as:  ULTRAM  Take 100 mg by mouth every 6 (six) hours.       Allergies  Allergen Reactions  . Epinephrine Other (See Comments)    Patient says she is not allergic to epi, but that it makes her jittery.  WILL RECEIVE IF NEEDED IN AN EMERGENCY.   Follow-up Information    Follow up with Charlton Haws, MD.   Specialty:  Cardiology   Why:  we will arrange for stress testing along with follow-up with Dr. Eden Emms within the next 2 wksd.   Contact information:   [address] [place] [address] [phone]       Follow up with Follow up with PCP in 1-2 weeks In 1 week.       The results of significant diagnostics from this  hospitalization (including imaging, microbiology, ancillary and laboratory) are listed below for reference.    Significant Diagnostic Studies: Dg Chest Port 1 View  10/08/2014   CLINICAL DATA:  CHF  EXAM: PORTABLE CHEST - 1 VIEW  COMPARISON:  10/07/2014.  FINDINGS: Cardiomegaly with normal pulmonary vascularity. Prior CABG. Interim partial clearing of bilateral pulmonary infiltrates and pleural effusions consistent consistent with partial clearing of CHF. No pneumothorax. No acute osseus abnormality.  IMPRESSION: Partial clearing of congestive heart failure with partial clearing of bilateral pulmonary edema and bilateral pleural effusions. Prior CABG.   Electronically Signed   By: Maisie Fushomas  Register   On: 10/08/2014 08:08   Dg Chest Port 1 View  10/07/2014   CLINICAL DATA:  Shortness of breath  EXAM: PORTABLE CHEST - 1 VIEW  COMPARISON:  None available  FINDINGS: Previous CABG. Interstitial and airspace opacities in both lung bases, right greater than left. Small pleural effusions. Mild cardiomegaly. Atheromatous aorta. No pneumothorax.  IMPRESSION: 1. Mild cardiomegaly with bibasilar infiltrate/edema and small effusions, suspect CHF.   Electronically Signed   By: Oley Balmaniel  Hassell M.D.   On: 10/07/2014 13:33    Microbiology: Recent Results (from the past 240 hour(s))  Urine culture     Status: None   Collection Time: 10/07/14  3:55 PM  Result Value Ref Range Status   Specimen Description URINE, CLEAN CATCH  Final   Special Requests NONE  Final   Colony Count   Final    30,000 COLONIES/ML Performed at [facility]    Culture   Final    Multiple bacterial morphotypes present, none predominant. Suggest appropriate recollection if clinically indicated. Performed at [facility]    Report Status 10/08/2014 FINAL  Final  MRSA PCR Screening     Status: Abnormal   Collection Time: 10/07/14  8:59 PM  Result Value Ref Range Status   MRSA by PCR POSITIVE (A) NEGATIVE Final    Comment:         The GeneXpert MRSA Assay (FDA approved for NASAL specimens only), is one component of a comprehensive MRSA colonization surveillance program. It is not intended to diagnose MRSA infection nor to guide or monitor treatment for MRSA infections. RESULT CALLED TO, READ BACK BY AND VERIFIED WITH: H.SAYLES,RN AT 0025 10/08/14 BY L.PITT      Labs: Basic Metabolic Panel:  Recent Labs Lab 10/07/14 1327 10/08/14 0116 10/09/14 0347 10/10/14 0359  NA 138 138 140 137  K 3.9 4.1 4.3 3.7  CL 105 104 102 102  CO2 26 25 27 26   GLUCOSE 175* 121* 158* 246*  BUN 29* 29* 29* 27*  CREATININE 1.82* 1.84* 1.92* 1.88*  CALCIUM 8.5 8.6 8.6 8.4   Liver Function Tests: No results for input(s): AST, ALT, ALKPHOS, BILITOT, PROT, ALBUMIN in the last 168 hours. No results for input(s): LIPASE, AMYLASE in the last 168 hours. No results for input(s): AMMONIA in the last 168 hours. CBC:  Recent Labs Lab 10/07/14 1327 10/09/14 0347  WBC 7.4 7.9  HGB 8.0* 8.5*  HCT 25.4* 27.2*  MCV 90.4 93.5  PLT 263 266   Cardiac Enzymes:  Recent Labs Lab 10/07/14 2000 10/08/14 0116 10/08/14 0744  TROPONINI <0.03 <0.03 <0.03   BNP: BNP (last 3 results)  Recent Labs  10/07/14 1327  BNP 2584.7*    ProBNP (last 3 results) No results for input(s): PROBNP in the last 8760 hours.  CBG:  Recent Labs Lab 10/09/14 1121 10/09/14 1625 10/09/14 2140 10/10/14 0624 10/10/14 1125  GLUCAP 108* 231* 325* 237* 249*    Signed:  CHIU, STEPHEN K  [facility] 10/10/2014, 2:41 PM

## 2014-10-10 NOTE — Progress Notes (Signed)
Patient Name: Taylor Padilla Date of Encounter: 10/10/2014  Principal Problem:   SOB (shortness of breath) Active Problems:   Diabetes mellitus without complication   Hypertension   CAD (coronary artery disease)   CKD (chronic kidney disease), stage II   Hypothyroidism   Dyslipidemia   GERD (gastroesophageal reflux disease)   Acute respiratory failure with hypoxia   Bradycardia   Anemia   CHF (congestive heart failure)   Absolute anemia   Coronary artery disease involving native coronary artery of native heart without angina pectoris   Essential hypertension   Congestive heart disease   Length of Stay: 3  SUBJECTIVE  The patient feels significantly better.   CURRENT MEDS . amLODipine  10 mg Oral Daily  . ARIPiprazole  2 mg Oral Daily  . aspirin EC  81 mg Oral Daily  . atorvastatin  80 mg Oral QHS  . carvedilol  12.5 mg Oral BID WC  . Chlorhexidine Gluconate Cloth  6 each Topical Q0600  . clopidogrel  75 mg Oral Daily  . dorzolamide-timolol  1 drop Both Eyes Q12H  . furosemide  60 mg Intravenous BID  . heparin  5,000 Units Subcutaneous 3 times per day  . hydrALAZINE  75 mg Oral 3 times per day  . insulin aspart  0-5 Units Subcutaneous QHS  . insulin aspart  0-9 Units Subcutaneous TID WC  . insulin glargine  20 Units Subcutaneous BID  . isosorbide dinitrate  10 mg Oral TID  . levothyroxine  100 mcg Oral QAC breakfast  . mupirocin ointment  1 application Nasal BID  . pantoprazole  20 mg Oral Daily  . polyvinyl alcohol  1 drop Both Eyes BID  . potassium chloride  20 mEq Oral BID  . prednisoLONE acetate  1 drop Left Eye QID  . sertraline  200 mg Oral QHS  . sodium chloride  3 mL Intravenous Q12H  . traMADol  100 mg Oral Q12H   OBJECTIVE  Filed Vitals:   10/09/14 2144 10/10/14 0631 10/10/14 1205 10/10/14 1217  BP: 168/54 156/78 160/70   Pulse: 62 62  66  Temp: 97.8 F (36.6 C) 98.7 F (37.1 C)    TempSrc: Oral Oral    Resp: 18 18    Height:      Weight:   144 lb 6.4 oz (65.499 kg)    SpO2: 97% 96%      Intake/Output Summary (Last 24 hours) at 10/10/14 1333 Last data filed at 10/10/14 1316  Gross per 24 hour  Intake    720 ml  Output   2200 ml  Net  -1480 ml   Filed Weights   10/08/14 0457 10/09/14 0300 10/10/14 0631  Weight: 155 lb 3.3 oz (70.4 kg) 146 lb 9.6 oz (66.497 kg) 144 lb 6.4 oz (65.499 kg)    PHYSICAL EXAM  General: Pleasant, NAD. Neuro: Alert and oriented X 3. Moves all extremities spontaneously. Psych: Normal affect. HEENT:  Normal  Neck: Supple without bruits or JVD. Lungs:  Resp regular and unlabored, CTA. Heart: RRR no s3, s4, or murmurs. Abdomen: Soft, non-tender, non-distended, BS + x 4.  Extremities: No clubbing, cyanosis or edema. DP/PT/Radials 2+ and equal bilaterally.  Accessory Clinical Findings  CBC  Recent Labs  10/09/14 0347  WBC 7.9  HGB 8.5*  HCT 27.2*  MCV 93.5  PLT 266   Basic Metabolic Panel  Recent Labs  10/09/14 0347 10/10/14 0359  NA 140 137  K 4.3 3.7  CL  102 102  CO2 27 26  GLUCOSE 158* 246*  BUN 29* 27*  CREATININE 1.92* 1.88*  CALCIUM 8.6 8.4   Liver Function Tests No results for input(s): AST, ALT, ALKPHOS, BILITOT, PROT, ALBUMIN in the last 72 hours. No results for input(s): LIPASE, AMYLASE in the last 72 hours. Cardiac Enzymes  Recent Labs  10/07/14 2000 10/08/14 0116 10/08/14 0744  TROPONINI <0.03 <0.03 <0.03   BNP Invalid input(s): POCBNP D-Dimer No results for input(s): DDIMER in the last 72 hours. Hemoglobin A1C  Recent Labs  10/07/14 1624  HGBA1C 6.6*    Recent Labs  10/08/14 0116  TSH 10.329*   Radiology/Studies  Dg Chest Port 1 View  10/08/2014   CLINICAL DATA:  CHF  EXAM: PORTABLE CHEST - 1 VIEW  COMPARISON:  10/07/2014.  FINDINGS: Cardiomegaly with normal pulmonary vascularity. Prior CABG. Interim partial clearing of bilateral pulmonary infiltrates and pleural effusions consistent consistent with partial clearing of CHF. No  pneumothorax. No acute osseus abnormality.  IMPRESSION: Partial clearing of congestive heart failure with partial clearing of bilateral pulmonary edema and bilateral pleural effusions. Prior CABG.   Electronically Signed   By: Maisie Fushomas  Register   On: 10/08/2014 08:08   Dg Chest Port 1 View  10/07/2014   CLINICAL DATA:  Shortness of breath  EXAM: PORTABLE CHEST - 1 VIEW  COMPARISON:  None available  FINDINGS: Previous CABG. Interstitial and airspace opacities in both lung bases, right greater than left. Small pleural effusions. Mild cardiomegaly. Atheromatous aorta. No pneumothorax.  IMPRESSION: 1. Mild cardiomegaly with bibasilar infiltrate/edema and small effusions, suspect CHF.   Electronically Signed   By: Oley Balmaniel  Hassell M.D.   On: 10/07/2014 13:33   TELE: SR    ASSESSMENT AND PLAN  69 year old Caucasian female with past medical history of HTN, DM, glaucoma with severely diminished vision, chronic kidney disease, anemia, and CAD [place] 4v [place] presented with HF symptoms.  Acute on chronic diastolic CHF -- Breating better. Net fluids: neg ~10L overall. She wants to go home, I would discharge on Lasix PO 80 mg daily with a follow up in our clinic in 2 weeks with BMP check up.  -- SCr stable -- 2D ECHO with EF 60-65%, no RWMAs, doppler parameters c/w restrictive physiology. Severely dilated LA, mod MR, mild RV dilation, mild RA dilation, PA pk pressure 48.  -- Continue BB and nitrates/hydralazine  Hypertension- Poorly controlled.  -- She is on Amlodpine 10, coreg 12.5 mg bid, hydralazine 50 Q8 (increase to 75mg  today), isodril 10 tid - increase to 20 mg po TID.  CAD  -- Continue ASA, plavix -- With CABG in 2003 should ultimately have a pharmacologic nuclear study for ischemic assessment. We will do as outpatient.    CKD- stage II -- Holding ARB. Renal artery dopplers with no evidence of renal artery stenosis. Intrarenal resistive indices above 0.7 (above  normal limits) on the left. Incidental finding bilateral cortical renal cysts.  Hypothyroidism -- TSH 10.3.29. TSH elevated; will need thyroid dose adjustment.  Dyslipidemia -- On statin  Bradycardia-- Do not increase coreg. Will increase hydralazine for better BP control    Anemia- H/H 8.5/27.2  Diabetes mellitus without complication- per IM.    Signed, Lars MassonNELSON, Nyja Westbrook H MD, [facility] 10/10/2014

## 2014-10-10 NOTE — Progress Notes (Signed)
[facility].   CSW facilitated pt discharge needs including contacting facility, faxing pt discharge information to facility and confirmed facility received and reviewed information, discussing with pt at bedside, providing RN phone number to call report, and arranging ambulance transport via [facility].   Pt very eager to return to her facility today. Appreciative of CSW assistance in her transfer back to her ALF.  No further social work needs identified at this time.  CSW signing off.   Loletta SpecterSuzanna Riel Hirschman, MSW, LCSW Clinical Social Work [employer] [phone]

## 2014-10-22 ENCOUNTER — Encounter (HOSPITAL_COMMUNITY): Payer: Commercial Managed Care - HMO

## 2014-10-31 ENCOUNTER — Inpatient Hospital Stay (HOSPITAL_COMMUNITY)
Admission: EM | Admit: 2014-10-31 | Discharge: 2014-11-03 | DRG: 291 | Payer: Medicare PPO | Attending: Internal Medicine | Admitting: Internal Medicine

## 2014-10-31 ENCOUNTER — Emergency Department (HOSPITAL_COMMUNITY): Payer: Medicare PPO

## 2014-10-31 ENCOUNTER — Encounter (HOSPITAL_COMMUNITY): Payer: Self-pay | Admitting: Emergency Medicine

## 2014-10-31 DIAGNOSIS — N182 Chronic kidney disease, stage 2 (mild): Secondary | ICD-10-CM | POA: Diagnosis present

## 2014-10-31 DIAGNOSIS — I129 Hypertensive chronic kidney disease with stage 1 through stage 4 chronic kidney disease, or unspecified chronic kidney disease: Secondary | ICD-10-CM | POA: Diagnosis present

## 2014-10-31 DIAGNOSIS — K2100 Gastro-esophageal reflux disease with esophagitis, without bleeding: Secondary | ICD-10-CM

## 2014-10-31 DIAGNOSIS — I5043 Acute on chronic combined systolic (congestive) and diastolic (congestive) heart failure: Secondary | ICD-10-CM

## 2014-10-31 DIAGNOSIS — J9601 Acute respiratory failure with hypoxia: Secondary | ICD-10-CM | POA: Diagnosis present

## 2014-10-31 DIAGNOSIS — Z7982 Long term (current) use of aspirin: Secondary | ICD-10-CM | POA: Diagnosis not present

## 2014-10-31 DIAGNOSIS — E119 Type 2 diabetes mellitus without complications: Secondary | ICD-10-CM | POA: Diagnosis present

## 2014-10-31 DIAGNOSIS — E785 Hyperlipidemia, unspecified: Secondary | ICD-10-CM | POA: Diagnosis present

## 2014-10-31 DIAGNOSIS — I2510 Atherosclerotic heart disease of native coronary artery without angina pectoris: Secondary | ICD-10-CM | POA: Diagnosis present

## 2014-10-31 DIAGNOSIS — E1121 Type 2 diabetes mellitus with diabetic nephropathy: Secondary | ICD-10-CM

## 2014-10-31 DIAGNOSIS — Z6830 Body mass index (BMI) 30.0-30.9, adult: Secondary | ICD-10-CM

## 2014-10-31 DIAGNOSIS — I2583 Coronary atherosclerosis due to lipid rich plaque: Secondary | ICD-10-CM

## 2014-10-31 DIAGNOSIS — R0602 Shortness of breath: Secondary | ICD-10-CM

## 2014-10-31 DIAGNOSIS — R001 Bradycardia, unspecified: Secondary | ICD-10-CM

## 2014-10-31 DIAGNOSIS — I252 Old myocardial infarction: Secondary | ICD-10-CM | POA: Diagnosis not present

## 2014-10-31 DIAGNOSIS — H540 Blindness, both eyes: Secondary | ICD-10-CM | POA: Diagnosis present

## 2014-10-31 DIAGNOSIS — D649 Anemia, unspecified: Secondary | ICD-10-CM | POA: Diagnosis present

## 2014-10-31 DIAGNOSIS — E780 Pure hypercholesterolemia: Secondary | ICD-10-CM | POA: Diagnosis present

## 2014-10-31 DIAGNOSIS — E662 Morbid (severe) obesity with alveolar hypoventilation: Secondary | ICD-10-CM | POA: Diagnosis present

## 2014-10-31 DIAGNOSIS — R0902 Hypoxemia: Secondary | ICD-10-CM | POA: Diagnosis present

## 2014-10-31 DIAGNOSIS — E039 Hypothyroidism, unspecified: Secondary | ICD-10-CM | POA: Diagnosis present

## 2014-10-31 DIAGNOSIS — Z951 Presence of aortocoronary bypass graft: Secondary | ICD-10-CM

## 2014-10-31 DIAGNOSIS — I5033 Acute on chronic diastolic (congestive) heart failure: Principal | ICD-10-CM | POA: Diagnosis present

## 2014-10-31 DIAGNOSIS — K219 Gastro-esophageal reflux disease without esophagitis: Secondary | ICD-10-CM | POA: Diagnosis present

## 2014-10-31 DIAGNOSIS — Z8673 Personal history of transient ischemic attack (TIA), and cerebral infarction without residual deficits: Secondary | ICD-10-CM | POA: Diagnosis not present

## 2014-10-31 DIAGNOSIS — I10 Essential (primary) hypertension: Secondary | ICD-10-CM | POA: Diagnosis present

## 2014-10-31 DIAGNOSIS — Z794 Long term (current) use of insulin: Secondary | ICD-10-CM

## 2014-10-31 DIAGNOSIS — H409 Unspecified glaucoma: Secondary | ICD-10-CM | POA: Diagnosis present

## 2014-10-31 DIAGNOSIS — I509 Heart failure, unspecified: Secondary | ICD-10-CM

## 2014-10-31 LAB — BASIC METABOLIC PANEL
Anion gap: 7 (ref 5–15)
BUN: 33 mg/dL — ABNORMAL HIGH (ref 6–23)
CO2: 28 mmol/L (ref 19–32)
Calcium: 8.7 mg/dL (ref 8.4–10.5)
Chloride: 104 mmol/L (ref 96–112)
Creatinine, Ser: 1.96 mg/dL — ABNORMAL HIGH (ref 0.50–1.10)
GFR calc non Af Amer: 25 mL/min — ABNORMAL LOW (ref >90)
GFR, EST AFRICAN AMERICAN: 29 mL/min — AB (ref >90)
Glucose, Bld: 90 mg/dL (ref 70–99)
Potassium: 3.8 mmol/L (ref 3.5–5.1)
Sodium: 139 mmol/L (ref 135–145)

## 2014-10-31 LAB — CBC WITH DIFFERENTIAL/PLATELET
Basophils Absolute: 0 10*3/uL (ref 0.0–0.1)
Basophils Relative: 0 % (ref 0–1)
EOS ABS: 0.4 10*3/uL (ref 0.0–0.7)
EOS PCT: 5 % (ref 0–5)
HCT: 24.9 % — ABNORMAL LOW (ref 36.0–46.0)
Hemoglobin: 7.8 g/dL — ABNORMAL LOW (ref 12.0–15.0)
LYMPHS ABS: 0.7 10*3/uL (ref 0.7–4.0)
Lymphocytes Relative: 10 % — ABNORMAL LOW (ref 12–46)
MCH: 28.4 pg (ref 26.0–34.0)
MCHC: 31.3 g/dL (ref 30.0–36.0)
MCV: 90.5 fL (ref 78.0–100.0)
MONOS PCT: 6 % (ref 3–12)
Monocytes Absolute: 0.5 10*3/uL (ref 0.1–1.0)
NEUTROS ABS: 5.7 10*3/uL (ref 1.7–7.7)
NEUTROS PCT: 79 % — AB (ref 43–77)
Platelets: 243 10*3/uL (ref 150–400)
RBC: 2.75 MIL/uL — AB (ref 3.87–5.11)
RDW: 15.8 % — ABNORMAL HIGH (ref 11.5–15.5)
WBC: 7.3 10*3/uL (ref 4.0–10.5)

## 2014-10-31 LAB — CBG MONITORING, ED
GLUCOSE-CAPILLARY: 63 mg/dL — AB (ref 70–99)
Glucose-Capillary: 128 mg/dL — ABNORMAL HIGH (ref 70–99)

## 2014-10-31 LAB — I-STAT TROPONIN, ED: TROPONIN I, POC: 0.01 ng/mL (ref 0.00–0.08)

## 2014-10-31 LAB — BRAIN NATRIURETIC PEPTIDE: B NATRIURETIC PEPTIDE 5: 1931.3 pg/mL — AB (ref 0.0–100.0)

## 2014-10-31 MED ORDER — AMLODIPINE BESYLATE 10 MG PO TABS
10.0000 mg | ORAL_TABLET | Freq: Every day | ORAL | Status: DC
  Administered 2014-11-01 – 2014-11-03 (×3): 10 mg via ORAL
  Filled 2014-10-31 (×3): qty 1

## 2014-10-31 MED ORDER — SODIUM CHLORIDE 0.9 % IJ SOLN
3.0000 mL | Freq: Two times a day (BID) | INTRAMUSCULAR | Status: DC
  Administered 2014-10-31 – 2014-11-03 (×6): 3 mL via INTRAVENOUS

## 2014-10-31 MED ORDER — TRAMADOL HCL 50 MG PO TABS: 50.0000 mg | ORAL_TABLET | Freq: Four times a day (QID) | ORAL | Status: DC | PRN

## 2014-10-31 MED ORDER — SERTRALINE HCL 100 MG PO TABS
200.0000 mg | ORAL_TABLET | Freq: Every day | ORAL | Status: DC
  Administered 2014-10-31 – 2014-11-02 (×3): 200 mg via ORAL
  Filled 2014-10-31 (×4): qty 2

## 2014-10-31 MED ORDER — DORZOLAMIDE HCL-TIMOLOL MAL 2-0.5 % OP SOLN
1.0000 [drp] | Freq: Two times a day (BID) | OPHTHALMIC | Status: DC
  Administered 2014-10-31 – 2014-11-03 (×6): 1 [drp] via OPHTHALMIC
  Filled 2014-10-31: qty 10

## 2014-10-31 MED ORDER — ACETAMINOPHEN 325 MG PO TABS
650.0000 mg | ORAL_TABLET | ORAL | Status: DC | PRN
Start: 2014-10-31 — End: 2014-11-03
  Administered 2014-11-01: 650 mg via ORAL
  Filled 2014-10-31: qty 2

## 2014-10-31 MED ORDER — INSULIN GLARGINE 100 UNIT/ML ~~LOC~~ SOLN
15.0000 [IU] | Freq: Two times a day (BID) | SUBCUTANEOUS | Status: DC
  Administered 2014-11-01: 15 [IU] via SUBCUTANEOUS
  Filled 2014-10-31 (×2): qty 0.15

## 2014-10-31 MED ORDER — ARIPIPRAZOLE 5 MG PO TABS
5.0000 mg | ORAL_TABLET | Freq: Every day | ORAL | Status: DC
  Administered 2014-11-01 – 2014-11-03 (×3): 5 mg via ORAL
  Filled 2014-10-31 (×3): qty 1

## 2014-10-31 MED ORDER — SODIUM CHLORIDE 0.9 % IV SOLN: 250.0000 mL | INTRAVENOUS | Status: DC | PRN

## 2014-10-31 MED ORDER — ATORVASTATIN CALCIUM 80 MG PO TABS
80.0000 mg | ORAL_TABLET | Freq: Every day | ORAL | Status: DC
  Administered 2014-10-31 – 2014-11-02 (×3): 80 mg via ORAL
  Filled 2014-10-31 (×4): qty 1

## 2014-10-31 MED ORDER — PANTOPRAZOLE SODIUM 20 MG PO TBEC
20.0000 mg | DELAYED_RELEASE_TABLET | Freq: Every day | ORAL | Status: DC
  Administered 2014-11-01 – 2014-11-03 (×3): 20 mg via ORAL
  Filled 2014-10-31 (×4): qty 1

## 2014-10-31 MED ORDER — FUROSEMIDE 10 MG/ML IJ SOLN
80.0000 mg | Freq: Once | INTRAMUSCULAR | Status: AC
  Administered 2014-10-31: 80 mg via INTRAVENOUS
  Filled 2014-10-31: qty 8

## 2014-10-31 MED ORDER — LEVOTHYROXINE SODIUM 100 MCG PO TABS
100.0000 ug | ORAL_TABLET | Freq: Every day | ORAL | Status: DC
  Administered 2014-11-01 – 2014-11-03 (×3): 100 ug via ORAL
  Filled 2014-10-31 (×4): qty 1

## 2014-10-31 MED ORDER — POLYETHYL GLYCOL-PROPYL GLYCOL 0.4-0.3 % OP SOLN: 1.0000 [drp] | Freq: Two times a day (BID) | OPHTHALMIC | Status: DC

## 2014-10-31 MED ORDER — HYDRALAZINE HCL 50 MG PO TABS
75.0000 mg | ORAL_TABLET | Freq: Three times a day (TID) | ORAL | Status: DC
  Administered 2014-10-31 – 2014-11-02 (×6): 75 mg via ORAL
  Filled 2014-10-31 (×8): qty 1

## 2014-10-31 MED ORDER — ASPIRIN 325 MG PO TABS
325.0000 mg | ORAL_TABLET | Freq: Every day | ORAL | Status: DC
  Administered 2014-11-01 – 2014-11-03 (×3): 325 mg via ORAL
  Filled 2014-10-31 (×3): qty 1

## 2014-10-31 MED ORDER — POLYVINYL ALCOHOL 1.4 % OP SOLN
1.0000 [drp] | OPHTHALMIC | Status: DC | PRN
  Administered 2014-10-31 – 2014-11-02 (×2): 1 [drp] via OPHTHALMIC
  Filled 2014-10-31: qty 15

## 2014-10-31 MED ORDER — INSULIN GLARGINE 100 UNIT/ML SOLOSTAR PEN: 15.0000 [IU] | PEN_INJECTOR | Freq: Two times a day (BID) | SUBCUTANEOUS | Status: DC

## 2014-10-31 MED ORDER — ISOSORBIDE DINITRATE 20 MG PO TABS
20.0000 mg | ORAL_TABLET | Freq: Three times a day (TID) | ORAL | Status: DC
  Administered 2014-10-31 – 2014-11-03 (×8): 20 mg via ORAL
  Filled 2014-10-31 (×10): qty 1

## 2014-10-31 MED ORDER — SODIUM CHLORIDE 0.9 % IJ SOLN: 3.0000 mL | INTRAMUSCULAR | Status: DC | PRN

## 2014-10-31 MED ORDER — INSULIN ASPART 100 UNIT/ML ~~LOC~~ SOLN
0.0000 [IU] | Freq: Three times a day (TID) | SUBCUTANEOUS | Status: DC
  Administered 2014-11-01: 1 [IU] via SUBCUTANEOUS
  Administered 2014-11-01: 3 [IU] via SUBCUTANEOUS
  Administered 2014-11-01: 2 [IU] via SUBCUTANEOUS
  Administered 2014-11-02: 1 [IU] via SUBCUTANEOUS
  Administered 2014-11-02: 2 [IU] via SUBCUTANEOUS
  Administered 2014-11-03: 3 [IU] via SUBCUTANEOUS

## 2014-10-31 MED ORDER — HEPARIN SODIUM (PORCINE) 5000 UNIT/ML IJ SOLN
5000.0000 [IU] | Freq: Three times a day (TID) | INTRAMUSCULAR | Status: DC
  Administered 2014-10-31 – 2014-11-03 (×9): 5000 [IU] via SUBCUTANEOUS
  Filled 2014-10-31 (×11): qty 1

## 2014-10-31 MED ORDER — PREDNISOLONE ACETATE 1 % OP SUSP
1.0000 [drp] | Freq: Four times a day (QID) | OPHTHALMIC | Status: DC
  Administered 2014-10-31 – 2014-11-03 (×10): 1 [drp] via OPHTHALMIC
  Filled 2014-10-31: qty 1

## 2014-10-31 MED ORDER — ONDANSETRON HCL 4 MG/2ML IJ SOLN
4.0000 mg | Freq: Four times a day (QID) | INTRAMUSCULAR | Status: DC | PRN
Start: 2014-10-31 — End: 2014-11-03

## 2014-10-31 MED ORDER — FUROSEMIDE 10 MG/ML IJ SOLN
80.0000 mg | Freq: Every day | INTRAMUSCULAR | Status: DC
  Administered 2014-11-01 – 2014-11-02 (×2): 80 mg via INTRAVENOUS
  Filled 2014-10-31 (×3): qty 8

## 2014-10-31 MED ORDER — FAMOTIDINE 20 MG PO TABS
20.0000 mg | ORAL_TABLET | Freq: Every day | ORAL | Status: DC
  Administered 2014-10-31 – 2014-11-02 (×3): 20 mg via ORAL
  Filled 2014-10-31 (×4): qty 1

## 2014-10-31 MED ORDER — CARVEDILOL 12.5 MG PO TABS
12.5000 mg | ORAL_TABLET | Freq: Two times a day (BID) | ORAL | Status: DC
  Administered 2014-11-01 (×2): 12.5 mg via ORAL
  Filled 2014-10-31 (×7): qty 1

## 2014-10-31 MED ORDER — TRAMADOL HCL 50 MG PO TABS: 100.0000 mg | ORAL_TABLET | Freq: Four times a day (QID) | ORAL | Status: DC | PRN

## 2014-10-31 MED ORDER — CLOPIDOGREL BISULFATE 75 MG PO TABS
75.0000 mg | ORAL_TABLET | Freq: Every day | ORAL | Status: DC
  Administered 2014-11-01 – 2014-11-03 (×3): 75 mg via ORAL
  Filled 2014-10-31 (×3): qty 1

## 2014-10-31 NOTE — H&P (Addendum)
[facility] History and Physical  Taylor Padilla MVH:[id number] DOB: 1946/08/28 DOA: 10/31/2014  Referring physician: ED physician PCP: No PCP Per Patient  Specialists:   Chief Complaint: Shortness of breath  HPI: Taylor Padilla is a 69 y.o. female with PMH of CHF, CAD, CKD, DM, legal blindness, who presents with SOB  Patient was recently hospitalized from 2/3-2/6 because of diastolic congestive heart failure exacerbation. She was successfully treated in the hospital and discharged on 80 mg Lasix daily. Her weight was 65.4 kg at discharge. Patient is supposed to take 80 mg Lasix daily, but she is actually getting 80 mg every other day and 40 mg on the alternate days.  Patient has worsening shortness of breath and leg edema, and her body weight increased from 65.4 kg to 72.3 kg. Sshe also has orthopnea. She has mild dry cough, but no chest pain, fever or chills.  She also notes that she fell backwards yesterday. She states her walker slipped out and "flew backwards" hitting the back of her head and neck on a wall.  Denies any pain since the event but notes she began to feel dizzy last night, described as her equilibrium being off.  Patient does not have unilateral weakness, numbness or tingling sensations. CT- head and C-spine are negative for acute abnormalities. Patient does not have nausea, vomiting, diarrhea, abdominal pain. No symptoms of UTI.  In ED, patient was found to have elevated BNP at 1937, negative troponin, stable renal function, potassium 3.8, temperature normal. Chest x-ray showed interstitial edema. Patient is admitted to inpatient for further evaluation and treatment.  Review of Systems: As presented in the history of presenting illness, rest negative.  Where does patient live?  Assisted living Can patient participate in ADLs? None Allergy:  Allergies  Allergen Reactions  . Epinephrine Other (See Comments)    Patient says she is not allergic to epi, but that it makes  her jittery.  WILL RECEIVE IF NEEDED IN AN EMERGENCY.    Past Medical History  Diagnosis Date  . Hypertension   . CAD (coronary artery disease)   . 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 Macular edema     hx  . Glaucoma, both eyes   . High cholesterol   . CHF (congestive heart failure)   . Chronic kidney disease (CKD), stage II (mild)     [name] 10/07/2014  . Old myocardial infarct     "I was told I've had one; don't know when" (10/08/2014)  . Pneumonia 1990's X 1  . Hypothyroidism   . Type II diabetes mellitus   . Anemia   . History of blood transfusion 2003    "related to OHS"  . Stroke     "I've been told I've had one; I don't remember having any reaction from it at all"; denies residual on 10/08/2014    Past Surgical History  Procedure Laterality Date  . Coronary artery bypass graft  01/2002    "CABG X4" at [place]  . Cataract extraction w/ intraocular lens  implant, bilateral Bilateral     Social History:  reports that she has never smoked. She has never used smokeless tobacco. She reports that she does not drink alcohol or use illicit drugs.  Family History:  Family History  Problem Relation Age of Onset  . CAD Neg Hx      Prior to Admission medications   Medication Sig Start Date End Date Taking? Authorizing Provider  amLODipine (NORVASC) 10 MG tablet Take 10  mg by mouth daily. 09/27/14   Historical Provider, MD  ARIPiprazole (ABILIFY) 2 MG tablet Take 2 mg by mouth daily. 09/12/14   Historical Provider, MD  aspirin 325 MG tablet Take 325 mg by mouth daily.    Historical Provider, MD  atorvastatin (LIPITOR) 80 MG tablet Take 80 mg by mouth at bedtime. 09/21/14   Historical Provider, MD  carvedilol (COREG) 12.5 MG tablet Take 1 tablet (12.5 mg total) by mouth 2 (two) times daily with a meal. 10/10/14   Jerald Kief, MD  clopidogrel (PLAVIX) 75 MG tablet Take 75 mg by mouth daily. 09/24/14   Historical Provider, MD  dorzolamide-timolol (COSOPT) 22.3-6.8 MG/ML ophthalmic  solution Place 1 drop into both eyes every 12 (twelve) hours. 10/06/14   Historical Provider, MD  furosemide (LASIX) 80 MG tablet Take 1 tablet (80 mg total) by mouth daily. Alternating every other day with  10/10/14   Jerald Kief, MD  hydrALAZINE (APRESOLINE) 25 MG tablet Take 3 tablets (75 mg total) by mouth every 8 (eight) hours. 10/10/14   Jerald Kief, MD  insulin aspart (NOVOLOG) 100 UNIT/ML injection Inject 5 Units into the skin 3 (three) times daily with meals.    Historical Provider, MD  isosorbide dinitrate (ISORDIL) 10 MG tablet Take 2 tablets (20 mg total) by mouth 3 (three) times daily. 10/10/14   Jerald Kief, MD  lansoprazole (PREVACID) 30 MG capsule Take 30 mg by mouth daily. 09/27/14   Historical Provider, MD  LANTUS SOLOSTAR 100 UNIT/ML Solostar Pen Inject 20 Units into the skin 2 (two) times daily. 08/03/14   Historical Provider, MD  levothyroxine (SYNTHROID, LEVOTHROID) 100 MCG tablet Take 1 tablet (100 mcg total) by mouth daily before breakfast. 10/10/14   Jerald Kief, MD  nitroGLYCERIN 2.5 MG CR capsule Take 2.5 mg by mouth every 12 (twelve) hours as needed. Chest pain 07/03/14   Historical Provider, MD  Polyethyl Glycol-Propyl Glycol 0.4-0.3 % SOLN Apply 1 drop to eye 2 (two) times daily.    Historical Provider, MD  prednisoLONE acetate (PRED FORTE) 1 % ophthalmic suspension Place 1 drop into the left eye 4 (four) times daily. 09/27/14   Historical Provider, MD  ranitidine (ZANTAC) 150 MG tablet Take 150 mg by mouth 2 (two) times daily as needed. indigestion 09/24/14   Historical Provider, MD  sertraline (ZOLOFT) 100 MG tablet Take 200 mg by mouth at bedtime. 09/26/14   Historical Provider, MD  traMADol (ULTRAM) 50 MG tablet Take 100 mg by mouth every 6 (six) hours. 09/14/14   Historical Provider, MD    Physical Exam: Filed Vitals:   10/31/14 1634 10/31/14 1635 10/31/14 1700 10/31/14 1948  BP:  171/54 155/47   Pulse:  55  55  Temp:  97.8 F (36.6 C)    TempSrc:  Oral     Resp:  Height:  (1.549 m)     Weight: 72.394 kg (159 lb 9.6 oz)     SpO2: 89% 89%     General: Not in acute distress. Blinded bilaterally HEENT:       Eyes: PERRL, EOMI, no scleral icterus       ENT: No discharge from the ears and nose, no pharynx injection, no tonsillar enlargement.        Neck: positive JVD, no bruit, no mass felt. Cardiac: S1/S2, RRR, No murmurs, No gallops or rubs Pulm: Fine crackles bilaterally  Abd: Soft, nondistended, nontender, no rebound pain, no organomegaly, BS present  Ext: 2+ leg edema bilaterally. 2+DP/PT pulse bilaterally Musculoskeletal: No joint deformities, erythema, or stiffness, ROM full Skin: No rashes.  Neuro: Alert and oriented X3, cranial nerves II-XII grossly intact, muscle strength 5/5 in all extremeties, sensation to light touch intact.  Psych: Patient is not psychotic, no suicidal or hemocidal ideation.  Labs on Admission:  Basic Metabolic Panel:  Recent Labs Lab 10/31/14 1645  NA 139  K 3.8  CL 104  CO2 28  GLUCOSE 90  BUN 33*  CREATININE 1.96*  CALCIUM 8.7   Liver Function Tests: No results for input(s): AST, ALT, ALKPHOS, BILITOT, PROT, ALBUMIN in the last 168 hours. No results for input(s): LIPASE, AMYLASE in the last 168 hours. No results for input(s): AMMONIA in the last 168 hours. CBC:  Recent Labs Lab 10/31/14 1645  WBC 7.3  NEUTROABS 5.7  HGB 7.8*  HCT 24.9*  MCV 90.5  PLT 243   Cardiac Enzymes: No results for input(s): CKTOTAL, CKMB, CKMBINDEX, TROPONINI in the last 168 hours.  BNP (last 3 results)  Recent Labs  10/07/14 1327 10/31/14 1645  BNP 2584.7* 1931.3*    ProBNP (last 3 results) No results for input(s): PROBNP in the last 8760 hours.  CBG: No results for input(s): GLUCAP in the last 168 hours.  Radiological Exams on Admission: Dg Chest 2 View  10/31/2014   CLINICAL DATA:  Shortness of breath since yesterday.  EXAM: CHEST  2 VIEW  COMPARISON:  10/08/2014  FINDINGS:  Cardiomegaly with vascular congestion. Small bilateral effusions. Continued mild interstitial prominence could reflect slight interstitial edema, not significantly changed since prior study. No acute bony abnormality.  IMPRESSION: Continued slight interstitial prominence could reflect interstitial edema. No real change. Small bilateral effusions.   Electronically Signed   By: Charlett Nose M.D.   On: 10/31/2014 18:10   Ct Head Wo Contrast  10/31/2014   CLINICAL DATA:  Pt states she fell backwards, states walker flew out from under her.  EXAM: CT HEAD WITHOUT CONTRAST  CT CERVICAL SPINE WITHOUT CONTRAST  TECHNIQUE: Multidetector CT imaging of the head and cervical spine was performed following the standard protocol without intravenous contrast. Multiplanar CT image reconstructions of the cervical spine were also generated.  COMPARISON:  None.  FINDINGS: CT HEAD FINDINGS  No acute intracranial hemorrhage. No focal mass lesion. No CT evidence of acute infarction. No midline shift or mass effect. No hydrocephalus. Basilar cisterns are patent.  There is periventricular white matter hypodensities. There is generalized cortical atrophy.  Paranasal sinuses and  mastoid air cells are clear.  CT CERVICAL SPINE FINDINGS  No prevertebral soft tissue swelling. Normal alignment of cervical vertebral bodies. No loss of vertebral body height. Normal facet articulation. Normal craniocervical junction.  There is joint space narrowing and endplate osteophytosis at C5-C6 and C6-7.  No evidence epidural or paraspinal hematoma.  IMPRESSION: 1. No intracranial trauma. 2. Atrophy and microvascular disease. 3. No cervical spine fracture. 4. Disc osteophytic disease of the lower cervical spine.  Also the CT head cervical   Electronically Signed   By: Genevive Bi M.D.   On: 10/31/2014 19:12   Ct Cervical Spine Wo Contrast  10/31/2014   CLINICAL DATA:  Pt states she fell backwards, states walker flew out from under her.  EXAM: CT  HEAD WITHOUT CONTRAST  CT CERVICAL SPINE WITHOUT CONTRAST  TECHNIQUE: Multidetector CT imaging of the head and cervical spine was performed following the standard protocol without intravenous contrast. Multiplanar CT image reconstructions of the cervical  spine were also generated.  COMPARISON:  None.  FINDINGS: CT HEAD FINDINGS  No acute intracranial hemorrhage. No focal mass lesion. No CT evidence of acute infarction. No midline shift or mass effect. No hydrocephalus. Basilar cisterns are patent.  There is periventricular white matter hypodensities. There is generalized cortical atrophy.  Paranasal sinuses and  mastoid air cells are clear.  CT CERVICAL SPINE FINDINGS  No prevertebral soft tissue swelling. Normal alignment of cervical vertebral bodies. No loss of vertebral body height. Normal facet articulation. Normal craniocervical junction.  There is joint space narrowing and endplate osteophytosis at C5-C6 and C6-7.  No evidence epidural or paraspinal hematoma.  IMPRESSION: 1. No intracranial trauma. 2. Atrophy and microvascular disease. 3. No cervical spine fracture. 4. Disc osteophytic disease of the lower cervical spine.  Also the CT head cervical   Electronically Signed   By: Genevive BiStewart  Edmunds M.D.   On: 10/31/2014 19:12    EKG: Independently reviewed.   Assessment/Plan Principal Problem:   CHF exacerbation Active Problems:   Diabetes mellitus without complication   CAD (coronary artery disease)   CKD (chronic kidney disease), stage II   Hypothyroidism   Dyslipidemia   GERD (gastroesophageal reflux disease)   Bradycardia   Essential hypertension  Congestive heart failure exacerbation: 2-D echo on 10/08/14 showed EF 60-65%. Her symptoms and elevated BNP plus x-ray findings of interstitial edema are consistent with congestive heart failure. Patient does not have signs of infection for pneumonia. No chest pain, less likely to have a pulmonary embolism.  -will admit to Telemetry bed. -will  treat with IV lasix 80 mg qdaily -will continue home ASA, carvedilol - Not on ACEI due to CKD -will not get 2-D echo to evaluate EF since she just had one on 10/08/14. -strict In/Out -Daily body weight. -heart healthy diet  CAD: s/p CABG greater than 10 years ago. No chest pain. Troponin negative -Continue aspirin and Plavix as well as statin  Bradycardia: Baseline heart rate 50s based on review of care everywhere records, stable -Continue current carvedilol dose: 12.5 mg bid   Diabetes mellitus without complication: A1c was 6.6 on 10/07/14 -decreased Lantus from 20 to 15 units bid -Provide sliding scale insulin  Hypertension: -Continue Norvasc and hydralazine  -on Coreg  CKD, stage II: creatinine slightly higher than baseline (1.88-->1.96), likely due to worsening heart failure -Treat congestive heart failure exacerbation -follow-up renal function by BMP   Hypothyroidism -Continue Synthroid  Dyslipidemia -Continue statin  GERD  -cont PPI and pepcid  DVT ppx: SQ Heparin       Code Status: Full code Family Communication: None at bed side.        Disposition Plan: Admit to inpatient   Date of Service 10/31/2014    Lorretta HarpIU, Jhoselyn Ruffini [facility] Pager [phone]  If 7PM-7AM, please contact night-coverage [email or web address] Password [email or web address] 10/31/2014, 8:00 PM

## 2014-10-31 NOTE — ED Provider Notes (Signed)
Medical screening examination/treatment/procedure(s) were conducted as a shared visit with non-physician practitioner(s) and myself.  I personally evaluated the patient during the encounter.   EKG Interpretation   Date/Time:  Saturday October 31 2014 16:30:36 EST Ventricular Rate:  56 PR Interval:  179 QRS Duration: 100 QT Interval:  501 QTC Calculation: 484 R Axis:   164 Text Interpretation:  Sinus rhythm Right axis deviation Nonspecific T  abnormalities, lateral leads No significant change since last tracing  Confirmed by Burnham Trost  MD, Caretha Rumbaugh [id number]) on 10/31/2014 4:35:53 PM      Results for orders placed or performed during the hospital encounter of 10/31/14  Basic metabolic panel  Result Value Ref Range   Sodium 139 135 - 145 mmol/L   Potassium 3.8 3.5 - 5.1 mmol/L   Chloride 104 96 - 112 mmol/L   CO2 28 19 - 32 mmol/L   Glucose, Bld 90 70 - 99 mg/dL   BUN 33 (H) 6 - 23 mg/dL   Creatinine, Ser 1.30 (H) 0.50 - 1.10 mg/dL   Calcium 8.7 8.4 - 86.5 mg/dL   GFR calc non Af Amer 25 (L) >90 mL/min   GFR calc Af Amer 29 (L) >90 mL/min   Anion gap 7 5 - 15  CBC with Differential  Result Value Ref Range   WBC 7.3 4.0 - 10.5 K/uL   RBC 2.75 (L) 3.87 - 5.11 MIL/uL   Hemoglobin 7.8 (L) 12.0 - 15.0 g/dL   HCT 78.4 (L) 69.6 - 29.5 %   MCV 90.5 78.0 - 100.0 fL   MCH 28.4 26.0 - 34.0 pg   MCHC 31.3 30.0 - 36.0 g/dL   RDW 28.4 (H) 13.2 - 44.0 %   Platelets 243 150 - 400 K/uL   Neutrophils Relative % 79 (H) 43 - 77 %   Neutro Abs 5.7 1.7 - 7.7 K/uL   Lymphocytes Relative 10 (L) 12 - 46 %   Lymphs Abs 0.7 0.7 - 4.0 K/uL   Monocytes Relative 6 3 - 12 %   Monocytes Absolute 0.5 0.1 - 1.0 K/uL   Eosinophils Relative 5 0 - 5 %   Eosinophils Absolute 0.4 0.0 - 0.7 K/uL   Basophils Relative 0 0 - 1 %   Basophils Absolute 0.0 0.0 - 0.1 K/uL  Brain natriuretic peptide  Result Value Ref Range   B Natriuretic Peptide 1931.3 (H) 0.0 - 100.0 pg/mL  I-stat troponin, ED  Result Value Ref  Range   Troponin i, poc 0.01 0.00 - 0.08 ng/mL   Comment 3           Dg Chest 2 View  10/31/2014   CLINICAL DATA:  Shortness of breath since yesterday.  EXAM: CHEST  2 VIEW  COMPARISON:  10/08/2014  FINDINGS: Cardiomegaly with vascular congestion. Small bilateral effusions. Continued mild interstitial prominence could reflect slight interstitial edema, not significantly changed since prior study. No acute bony abnormality.  IMPRESSION: Continued slight interstitial prominence could reflect interstitial edema. No real change. Small bilateral effusions.   Electronically Signed   By: Charlett Nose M.D.   On: 10/31/2014 18:10   Ct Head Wo Contrast  10/31/2014   CLINICAL DATA:  Pt states she fell backwards, states walker flew out from under her.  EXAM: CT HEAD WITHOUT CONTRAST  CT CERVICAL SPINE WITHOUT CONTRAST  TECHNIQUE: Multidetector CT imaging of the head and cervical spine was performed following the standard protocol without intravenous contrast. Multiplanar CT image reconstructions of the cervical spine were also  generated.  COMPARISON:  None.  FINDINGS: CT HEAD FINDINGS  No acute intracranial hemorrhage. No focal mass lesion. No CT evidence of acute infarction. No midline shift or mass effect. No hydrocephalus. Basilar cisterns are patent.  There is periventricular white matter hypodensities. There is generalized cortical atrophy.  Paranasal sinuses and  mastoid air cells are clear.  CT CERVICAL SPINE FINDINGS  No prevertebral soft tissue swelling. Normal alignment of cervical vertebral bodies. No loss of vertebral body height. Normal facet articulation. Normal craniocervical junction.  There is joint space narrowing and endplate osteophytosis at C5-C6 and C6-7.  No evidence epidural or paraspinal hematoma.  IMPRESSION: 1. No intracranial trauma. 2. Atrophy and microvascular disease. 3. No cervical spine fracture. 4. Disc osteophytic disease of the lower cervical spine.  Also the CT head cervical    Electronically Signed   By: Genevive Bi M.D.   On: 10/31/2014 19:12   Ct Cervical Spine Wo Contrast  10/31/2014   CLINICAL DATA:  Pt states she fell backwards, states walker flew out from under her.  EXAM: CT HEAD WITHOUT CONTRAST  CT CERVICAL SPINE WITHOUT CONTRAST  TECHNIQUE: Multidetector CT imaging of the head and cervical spine was performed following the standard protocol without intravenous contrast. Multiplanar CT image reconstructions of the cervical spine were also generated.  COMPARISON:  None.  FINDINGS: CT HEAD FINDINGS  No acute intracranial hemorrhage. No focal mass lesion. No CT evidence of acute infarction. No midline shift or mass effect. No hydrocephalus. Basilar cisterns are patent.  There is periventricular white matter hypodensities. There is generalized cortical atrophy.  Paranasal sinuses and  mastoid air cells are clear.  CT CERVICAL SPINE FINDINGS  No prevertebral soft tissue swelling. Normal alignment of cervical vertebral bodies. No loss of vertebral body height. Normal facet articulation. Normal craniocervical junction.  There is joint space narrowing and endplate osteophytosis at C5-C6 and C6-7.  No evidence epidural or paraspinal hematoma.  IMPRESSION: 1. No intracranial trauma. 2. Atrophy and microvascular disease. 3. No cervical spine fracture. 4. Disc osteophytic disease of the lower cervical spine.  Also the CT head cervical   Electronically Signed   By: Genevive Bi M.D.   On: 10/31/2014 19:12   Dg Chest Port 1 View  10/08/2014   CLINICAL DATA:  CHF  EXAM: PORTABLE CHEST - 1 VIEW  COMPARISON:  10/07/2014.  FINDINGS: Cardiomegaly with normal pulmonary vascularity. Prior CABG. Interim partial clearing of bilateral pulmonary infiltrates and pleural effusions consistent consistent with partial clearing of CHF. No pneumothorax. No acute osseus abnormality.  IMPRESSION: Partial clearing of congestive heart failure with partial clearing of bilateral pulmonary edema and  bilateral pleural effusions. Prior CABG.   Electronically Signed   By: Maisie Fus  Register   On: 10/08/2014 08:08   Dg Chest Port 1 View  10/07/2014   CLINICAL DATA:  Shortness of breath  EXAM: PORTABLE CHEST - 1 VIEW  COMPARISON:  None available  FINDINGS: Previous CABG. Interstitial and airspace opacities in both lung bases, right greater than left. Small pleural effusions. Mild cardiomegaly. Atheromatous aorta. No pneumothorax.  IMPRESSION: 1. Mild cardiomegaly with bibasilar infiltrate/edema and small effusions, suspect CHF.   Electronically Signed   By: Oley Balm M.D.   On: 10/07/2014 13:33    Patient stays at the nursing facility. Patient with recent admission on February 3 for shortness of breath related to congestive heart failure. Patient was discharged on increase Lasix dose. However nursing facility has not been giving the full dose. This  may be contributing to her shortness of breath and the x-ray being consistent with congestive heart failure here today. Patient also had hypoxia normally not on oxygen off of oxygen her sats are below 90%. On 2 L sats are in the low 90s. Patient will require admission. Patient was given 80 of Lasix here. Patient currently stable lungs do sound clear bilaterally at this point in time but she's already had her treatment. Hospitalist will admit.    Vanetta MuldersScott Zayda Angell, MD 10/31/14 2000

## 2014-10-31 NOTE — ED Notes (Signed)
Transporting patient to new room assignment. 

## 2014-10-31 NOTE — ED Notes (Signed)
Pt given graham crackers and orange juice as requested.

## 2014-10-31 NOTE — ED Notes (Signed)
Patient transported to CT 

## 2014-10-31 NOTE — ED Notes (Signed)
Per EMS[facility]/o shortness of breath since yesterday worse today. Pt denies chest pain. Pt reports falling yesterday. Pt was using a walker and it slipped out from underneath her and pt fell back and hit head and no LOC noted. Pt A&OX4, NAD noted. VSS

## 2014-10-31 NOTE — ED Notes (Signed)
Dr Nonah MattesNeu hospitalist at bedside.

## 2014-10-31 NOTE — ED Provider Notes (Signed)
CSN: [id number]     Arrival date & time 10/31/14  1623 History   First MD Initiated Contact with Patient 10/31/14 1630     Chief Complaint  Patient presents with  . Shortness of Breath     (Consider location/radiation/quality/duration/timing/severity/associated sxs/prior Treatment) The history is provided by the patient and medical records.     Pt with hx CHF, CAD, CKD, DM, legal blindness p/w SOB and orthopnea, worse since yesterday.  Has mild dry cough and feels "rattling" in her chest.  Was admitted to the hospital 10/07/14-10/10/14 with acute respiratory failure with diastolic CHF, d/c home on  lasix PO daily.  Weight at discharge was 65.4kg.  Per MAR from [facility], pt is getting  every other day and  on the alternate days.  Denies any chest pain, fevers/chills, diaphoresis.  Has not noted leg swelling, doesn't know her weights, unsure of what medications she is given as she cannot see the pills.   She also notes that she fell backwards yesterday - states her walker slipped out from under her and she "flew backwards" hitting the back of her head and neck on a wall.  Denies any pain since the event but notes she began to feel dizzy last night, described as her equilibrium being off.  Denies weakness or numbness of the extremities, headache, neck or back pain.    Past Medical History  Diagnosis Date  . Hypertension   . CAD (coronary artery disease)   . 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 Macular edema     hx  . Glaucoma, both eyes   . High cholesterol   . CHF (congestive heart failure)   . Chronic kidney disease (CKD), stage II (mild)     Taylor Padilla 10/07/2014  . Old myocardial infarct     "I was told I've had one; don't know when" (10/08/2014)  . Pneumonia 1990's X 1  . Hypothyroidism   . Type II diabetes mellitus   . Anemia   . History of blood transfusion 2003    "related to OHS"  . Stroke     "I've been told I've had one; I don't remember having any reaction from it at  all"; denies residual on 10/08/2014   Past Surgical History  Procedure Laterality Date  . Coronary artery bypass graft  01/2002    "CABG X4" at [place]  . Cataract extraction w/ intraocular lens  implant, bilateral Bilateral    Family History  Problem Relation Age of Onset  . CAD Neg Hx    History  Substance Use Topics  . Smoking status: Never Smoker   . Smokeless tobacco: Never Used  . Alcohol Use: No   OB History    No data available     Review of Systems  All other systems reviewed and are negative.     Allergies  Epinephrine  Home Medications   Prior to Admission medications   Medication Sig Start Date End Date Taking? Authorizing Provider  amLODipine (NORVASC) 10 MG tablet Take 10 mg by mouth daily. 09/27/14   Historical Provider, MD  ARIPiprazole (ABILIFY) 2 MG tablet Take 2 mg by mouth daily. 09/12/14   Historical Provider, MD  aspirin 325 MG tablet Take 325 mg by mouth daily.    Historical Provider, MD  atorvastatin (LIPITOR) 80 MG tablet Take 80 mg by mouth at bedtime. 09/21/14   Historical Provider, MD  carvedilol (COREG) 12.5 MG tablet Take 1 tablet (12.5 mg total) by mouth 2 (two)  times daily with a meal. 10/10/14   Jerald Kief, MD  clopidogrel (PLAVIX) 75 MG tablet Take 75 mg by mouth daily. 09/24/14   Historical Provider, MD  dorzolamide-timolol (COSOPT) 22.3-6.8 MG/ML ophthalmic solution Place 1 drop into both eyes every 12 (twelve) hours. 10/06/14   Historical Provider, MD  furosemide (LASIX) 80 MG tablet Take 1 tablet (80 mg total) by mouth daily. Alternating every other day with  10/10/14   Jerald Kief, MD  hydrALAZINE (APRESOLINE) 25 MG tablet Take 3 tablets (75 mg total) by mouth every 8 (eight) hours. 10/10/14   Jerald Kief, MD  insulin aspart (NOVOLOG) 100 UNIT/ML injection Inject 5 Units into the skin 3 (three) times daily with meals.    Historical Provider, MD  isosorbide dinitrate (ISORDIL) 10 MG tablet Take 2 tablets (20 mg total) by mouth 3  (three) times daily. 10/10/14   Jerald Kief, MD  lansoprazole (PREVACID) 30 MG capsule Take 30 mg by mouth daily. 09/27/14   Historical Provider, MD  LANTUS SOLOSTAR 100 UNIT/ML Solostar Pen Inject 20 Units into the skin 2 (two) times daily. 08/03/14   Historical Provider, MD  levothyroxine (SYNTHROID, LEVOTHROID) 100 MCG tablet Take 1 tablet (100 mcg total) by mouth daily before breakfast. 10/10/14   Jerald Kief, MD  nitroGLYCERIN 2.5 MG CR capsule Take 2.5 mg by mouth every 12 (twelve) hours as needed. Chest pain 07/03/14   Historical Provider, MD  Polyethyl Glycol-Propyl Glycol 0.4-0.3 % SOLN Apply 1 drop to eye 2 (two) times daily.    Historical Provider, MD  prednisoLONE acetate (PRED FORTE) 1 % ophthalmic suspension Place 1 drop into the left eye 4 (four) times daily. 09/27/14   Historical Provider, MD  ranitidine (ZANTAC) 150 MG tablet Take 150 mg by mouth 2 (two) times daily as needed. indigestion 09/24/14   Historical Provider, MD  sertraline (ZOLOFT) 100 MG tablet Take 200 mg by mouth at bedtime. 09/26/14   Historical Provider, MD  traMADol (ULTRAM) 50 MG tablet Take 100 mg by mouth every 6 (six) hours. 09/14/14   Historical Provider, MD   BP 171/54 mmHg  Pulse 55  Temp(Src) 97.8 F (36.6 C) (Oral)  Resp 19  Ht  (1.549 m)  Wt 159 lb 9.6 oz (72.394 kg)  BMI 30.17 kg/m2  SpO2 89% Physical Exam  Constitutional: She appears well-developed and well-nourished. No distress.  HENT:  Head: Normocephalic and atraumatic.  Neck: Normal range of motion. Neck supple.  Cardiovascular: Normal rate and regular rhythm.   Pulmonary/Chest: Effort normal and breath sounds normal. No respiratory distress. She has no wheezes. She has no rales.  Abdominal: Soft. She exhibits no distension. There is no tenderness. There is no rebound and no guarding.  Musculoskeletal: She exhibits edema (Deep pitting edema of the bilateral lower extremities).  Neurological: She is alert.  Skin: She is not  diaphoretic.  Psychiatric: She has a normal mood and affect. Her behavior is normal.  Nursing note and vitals reviewed.   ED Course  Procedures (including critical care time) Labs Review Labs Reviewed  BASIC METABOLIC PANEL - Abnormal; Notable for the following:    BUN 33 (*)    Creatinine, Ser 1.96 (*)    GFR calc non Af Amer 25 (*)    GFR calc Af Amer 29 (*)    All other components within normal limits  CBC WITH DIFFERENTIAL/PLATELET - Abnormal; Notable for the following:    RBC 2.75 (*)    Hemoglobin  7.8 (*)    HCT 24.9 (*)    RDW 15.8 (*)    Neutrophils Relative % 79 (*)    Lymphocytes Relative 10 (*)    All other components within normal limits  BRAIN NATRIURETIC PEPTIDE - Abnormal; Notable for the following:    B Natriuretic Peptide 1931.3 (*)    All other components within normal limits  PROTIME-INR - Abnormal; Notable for the following:    Prothrombin Time >90.0 (*)    INR >10.00 (*)    All other components within normal limits  CBG MONITORING, ED - Abnormal; Notable for the following:    Glucose-Capillary 63 (*)    All other components within normal limits  CBG MONITORING, ED - Abnormal; Notable for the following:    Glucose-Capillary 128 (*)    All other components within normal limits  MRSA PCR SCREENING  MAGNESIUM  COMPREHENSIVE METABOLIC PANEL  PROTIME-INR  APTT  I-STAT TROPOININ, ED    Imaging Review Dg Chest 2 View  10/31/2014   CLINICAL DATA:  Shortness of breath since yesterday.  EXAM: CHEST  2 VIEW  COMPARISON:  10/08/2014  FINDINGS: Cardiomegaly with vascular congestion. Small bilateral effusions. Continued mild interstitial prominence could reflect slight interstitial edema, not significantly changed since prior study. No acute bony abnormality.  IMPRESSION: Continued slight interstitial prominence could reflect interstitial edema. No real change. Small bilateral effusions.   Electronically Signed   By: Charlett NoseKevin  Dover M.D.   On: 10/31/2014 18:10   Ct  Head Wo Contrast  10/31/2014   CLINICAL DATA:  Pt states she fell backwards, states walker flew out from under her.  EXAM: CT HEAD WITHOUT CONTRAST  CT CERVICAL SPINE WITHOUT CONTRAST  TECHNIQUE: Multidetector CT imaging of the head and cervical spine was performed following the standard protocol without intravenous contrast. Multiplanar CT image reconstructions of the cervical spine were also generated.  COMPARISON:  None.  FINDINGS: CT HEAD FINDINGS  No acute intracranial hemorrhage. No focal mass lesion. No CT evidence of acute infarction. No midline shift or mass effect. No hydrocephalus. Basilar cisterns are patent.  There is periventricular white matter hypodensities. There is generalized cortical atrophy.  Paranasal sinuses and  mastoid air cells are clear.  CT CERVICAL SPINE FINDINGS  No prevertebral soft tissue swelling. Normal alignment of cervical vertebral bodies. No loss of vertebral body height. Normal facet articulation. Normal craniocervical junction.  There is joint space narrowing and endplate osteophytosis at C5-C6 and C6-7.  No evidence epidural or paraspinal hematoma.  IMPRESSION: 1. No intracranial trauma. 2. Atrophy and microvascular disease. 3. No cervical spine fracture. 4. Disc osteophytic disease of the lower cervical spine.  Also the CT head cervical   Electronically Signed   By: Genevive BiStewart  Edmunds M.D.   On: 10/31/2014 19:12   Ct Cervical Spine Wo Contrast  10/31/2014   CLINICAL DATA:  Pt states she fell backwards, states walker flew out from under her.  EXAM: CT HEAD WITHOUT CONTRAST  CT CERVICAL SPINE WITHOUT CONTRAST  TECHNIQUE: Multidetector CT imaging of the head and cervical spine was performed following the standard protocol without intravenous contrast. Multiplanar CT image reconstructions of the cervical spine were also generated.  COMPARISON:  None.  FINDINGS: CT HEAD FINDINGS  No acute intracranial hemorrhage. No focal mass lesion. No CT evidence of acute infarction. No  midline shift or mass effect. No hydrocephalus. Basilar cisterns are patent.  There is periventricular white matter hypodensities. There is generalized cortical atrophy.  Paranasal sinuses and  mastoid air cells are clear.  CT CERVICAL SPINE FINDINGS  No prevertebral soft tissue swelling. Normal alignment of cervical vertebral bodies. No loss of vertebral body height. Normal facet articulation. Normal craniocervical junction.  There is joint space narrowing and endplate osteophytosis at C5-C6 and C6-7.  No evidence epidural or paraspinal hematoma.  IMPRESSION: 1. No intracranial trauma. 2. Atrophy and microvascular disease. 3. No cervical spine fracture. 4. Disc osteophytic disease of the lower cervical spine.  Also the CT head cervical   Electronically Signed   By: Genevive Bi M.D.   On: 10/31/2014 19:12     EKG Interpretation   Date/Time:  Saturday October 31 2014 16:30:36 EST Ventricular Rate:  56 PR Interval:  179 QRS Duration: 100 QT Interval:  501 QTC Calculation: 484 R Axis:   164 Text Interpretation:  Sinus rhythm Right axis deviation Nonspecific T  abnormalities, lateral leads No significant change since last tracing  Confirmed by ZACKOWSKI  MD, SCOTT [phone]) on 10/31/2014 4:35:53 PM       7:36 PM Discussed pt with Dr Deretha Emory.    8:00 PM Admitted to [facility].  Dr Clyde Lundborg accepts admission.   MDM   Final diagnoses:  Hypoxia  Acute on chronic diastolic heart failure    Afebrile nontoxic patient with hx CHF, CAD, CKD, DM recently admitted for respiratory failure with diastolic CHF (EF 95-62%, restrictive pathology noted).  O2 is 89% on room air today, pt has bilateral lower extremity pitting edema, weight is up from 65.4kg on d/c 10/10/14 to 72.3 kg today.  80mg  IV lasix given in ED without significant improvement. Labs significant for anemia, Hgb 7.8 (last 8.5 3 weeks ago), BUN/creat 33 and 1.9 (last 27 and 1.8), BNP elevated.  CXR essentially unchanged from recent  admission with some edema and small effusions.  EKG nonischemic and unchanged.  Admitted to [facility].       [place], PA-C 11/01/14 [phone]

## 2014-10-31 NOTE — ED Notes (Signed)
Pt returned from xray

## 2014-10-31 NOTE — ED Notes (Signed)
Pt placed on 2L [place] O2 sats <93%, notified EDP

## 2014-10-31 NOTE — ED Notes (Addendum)
Pt returned from CT °

## 2014-11-01 ENCOUNTER — Encounter (HOSPITAL_COMMUNITY): Payer: Self-pay

## 2014-11-01 DIAGNOSIS — R001 Bradycardia, unspecified: Secondary | ICD-10-CM

## 2014-11-01 DIAGNOSIS — I509 Heart failure, unspecified: Secondary | ICD-10-CM

## 2014-11-01 LAB — COMPREHENSIVE METABOLIC PANEL
ALT: 15 U/L (ref 0–35)
AST: 17 U/L (ref 0–37)
Albumin: 2.8 g/dL — ABNORMAL LOW (ref 3.5–5.2)
Alkaline Phosphatase: 67 U/L (ref 39–117)
Anion gap: 7 (ref 5–15)
BUN: 35 mg/dL — AB (ref 6–23)
CHLORIDE: 105 mmol/L (ref 96–112)
CO2: 24 mmol/L (ref 19–32)
CREATININE: 1.9 mg/dL — AB (ref 0.50–1.10)
Calcium: 8.1 mg/dL — ABNORMAL LOW (ref 8.4–10.5)
GFR calc non Af Amer: 26 mL/min — ABNORMAL LOW (ref >90)
GFR, EST AFRICAN AMERICAN: 30 mL/min — AB (ref >90)
Glucose, Bld: 242 mg/dL — ABNORMAL HIGH (ref 70–99)
POTASSIUM: 3.6 mmol/L (ref 3.5–5.1)
SODIUM: 136 mmol/L (ref 135–145)
TOTAL PROTEIN: 5.9 g/dL — AB (ref 6.0–8.3)
Total Bilirubin: 0.6 mg/dL (ref 0.3–1.2)

## 2014-11-01 LAB — MRSA PCR SCREENING: MRSA BY PCR: POSITIVE — AB

## 2014-11-01 LAB — CBC
HEMATOCRIT: 25 % — AB (ref 36.0–46.0)
Hemoglobin: 7.8 g/dL — ABNORMAL LOW (ref 12.0–15.0)
MCH: 28.3 pg (ref 26.0–34.0)
MCHC: 31.2 g/dL (ref 30.0–36.0)
MCV: 90.6 fL (ref 78.0–100.0)
PLATELETS: 220 10*3/uL (ref 150–400)
RBC: 2.76 MIL/uL — AB (ref 3.87–5.11)
RDW: 16 % — ABNORMAL HIGH (ref 11.5–15.5)
WBC: 6.9 10*3/uL (ref 4.0–10.5)

## 2014-11-01 LAB — MAGNESIUM: Magnesium: 2.3 mg/dL (ref 1.5–2.5)

## 2014-11-01 LAB — GLUCOSE, CAPILLARY
GLUCOSE-CAPILLARY: 166 mg/dL — AB (ref 70–99)
GLUCOSE-CAPILLARY: 136 mg/dL — AB (ref 70–99)
Glucose-Capillary: 134 mg/dL — ABNORMAL HIGH (ref 70–99)
Glucose-Capillary: 168 mg/dL — ABNORMAL HIGH (ref 70–99)
Glucose-Capillary: 204 mg/dL — ABNORMAL HIGH (ref 70–99)

## 2014-11-01 LAB — PROTIME-INR
INR: 1.2 (ref 0.00–1.49)
Prothrombin Time: 15.3 seconds — ABNORMAL HIGH (ref 11.6–15.2)
Prothrombin Time: >90 seconds — ABNORMAL HIGH (ref 11.6–15.2)

## 2014-11-01 LAB — APTT: aPTT: 40 seconds — ABNORMAL HIGH (ref 24–37)

## 2014-11-01 MED ORDER — CHLORHEXIDINE GLUCONATE CLOTH 2 % EX PADS
6.0000 | MEDICATED_PAD | Freq: Every day | CUTANEOUS | Status: DC
  Administered 2014-11-01 – 2014-11-03 (×3): 6 via TOPICAL

## 2014-11-01 MED ORDER — INSULIN GLARGINE 100 UNIT/ML ~~LOC~~ SOLN
15.0000 [IU] | Freq: Every day | SUBCUTANEOUS | Status: DC
  Administered 2014-11-02 – 2014-11-03 (×2): 15 [IU] via SUBCUTANEOUS
  Filled 2014-11-01 (×2): qty 0.15

## 2014-11-01 MED ORDER — ZOLPIDEM TARTRATE 5 MG PO TABS
5.0000 mg | ORAL_TABLET | Freq: Every evening | ORAL | Status: DC | PRN
  Administered 2014-11-01 – 2014-11-02 (×2): 5 mg via ORAL
  Filled 2014-11-01 (×2): qty 1

## 2014-11-01 MED ORDER — MUPIROCIN 2 % EX OINT
1.0000 "application " | TOPICAL_OINTMENT | Freq: Two times a day (BID) | CUTANEOUS | Status: DC
  Administered 2014-11-01 – 2014-11-03 (×5): 1 via NASAL
  Filled 2014-11-01: qty 22

## 2014-11-01 NOTE — Progress Notes (Signed)
Patient doing well tonight. Only complain was of being tired and wanting to go to sleep early. Administered medications and took CBG and vital signs.  All are stable at this time.  Will continue to monitor for any changes.

## 2014-11-01 NOTE — Progress Notes (Signed)
PT Cancellation Note  Patient Details Name: Taylor Padilla MRN: [id number] DOB: [birth year]   Cancelled Treatment:    Reason Eval/Treat Not Completed: Fatigue/lethargy limiting ability to participate;Patient declined, no reason specified.  Patient reports she has been "up and down" all day and is too fatigued to work with PT this afternoon.  Will return tomorrow for PT evaluation.   Vena AustriaDavis, Arye Weyenberg H 11/01/2014, 4:37 PM Durenda HurtSusan H. Renaldo Fiddleravis, PT, [facility] Pager [phone]

## 2014-11-01 NOTE — Progress Notes (Addendum)
[facility] PROGRESS NOTE  Taylor Padilla XBJ:[id number] DOB: June 07, 1946 DOA: 10/31/2014 PCP: No PCP Per Patient  Assessment/Plan: Acute Diastolic  Congestive heart failure exacerbation: 2-D echo on 10/08/14 showed EF 60-65%.  -Continue with IV lasix 80 mg qdaily -will continue home ASA, carvedilol - Not on ACEI due to CKD -will not get 2-D echo to evaluate EF since she just had one on 10/08/14. -strict In/Out -Daily weight.  CAD: s/p CABG greater than 10 years ago. No chest pain. Troponin negative -Continue aspirin and Plavix as well as statin  Bradycardia: Baseline heart rate 50s based on review of care everywhere records, stable -Continue current carvedilol dose: 12.5 mg bid  Diabetes mellitus without complication: A1c was 6.6 on 10/07/14 -decreased Lantus from 20 BID  to 15 units daily -Provide sliding scale insulin  Hypertension: -Continue Norvasc and hydralazine  -on Coreg  Anemia;  Follow Hb. Hb trend 7 to 8.   CKD, stage II: creatinine slightly higher than baseline (1.88-->1.96), likely due to worsening heart failure -Treat congestive heart failure exacerbation -follow-up renal function by BMP   Hypothyroidism -Continue Synthroid  Dyslipidemia -Continue statin  GERD  -cont PPI and pepcid  DVT ppx: SQ Heparin   Code Status: Full Code.  Family Communication: care discussed with patient.  Disposition Plan: remain inpatient for diuresis.    Consultants:  none  Procedures:  none  Antibiotics:  none  HPI/Subjective: Feeling better, breathing better.   Objective: Filed Vitals:   11/01/14 0504  BP: 168/79  Pulse: 72  Temp: 97.6 F (36.4 C)  Resp: 18    Intake/Output Summary (Last 24 hours) at 11/01/14 1400 Last data filed at 11/01/14 0846  Gross per 24 hour  Intake    600 ml  Output   1900 ml  Net  -1300 ml   Filed Weights   10/31/14 1634 10/31/14 2159 11/01/14 0504  Weight: 72.394 kg (159 lb 9.6 oz) 71.305 kg (157 lb 3.2 oz)  71.24 kg (157 lb 0.9 oz)    Exam:   General:  Alert in no distress.   Cardiovascular: S 1, S 2 RRR  Respiratory: crackles bases.   Abdomen: bs present, soft, nt  Musculoskeletal: plus 2 edema.   Data Reviewed: Basic Metabolic Panel:  Recent Labs Lab 10/31/14 1645 11/01/14 0048  NA 139 136  K 3.8 3.6  CL 104 105  CO2 28 24  GLUCOSE 90 242*  BUN 33* 35*  CREATININE 1.96* 1.90*  CALCIUM 8.7 8.1*  MG  --  2.3   Liver Function Tests:  Recent Labs Lab 11/01/14 0048  AST 17  ALT 15  ALKPHOS 67  BILITOT 0.6  PROT 5.9*  ALBUMIN 2.8*   No results for input(s): LIPASE, AMYLASE in the last 168 hours. No results for input(s): AMMONIA in the last 168 hours. CBC:  Recent Labs Lab 10/31/14 1645 11/01/14 1030  WBC 7.3 6.9  NEUTROABS 5.7  --   HGB 7.8* 7.8*  HCT 24.9* 25.0*  MCV 90.5 90.6  PLT 243 220   Cardiac Enzymes: No results for input(s): CKTOTAL, CKMB, CKMBINDEX, TROPONINI in the last 168 hours. BNP (last 3 results)  Recent Labs  10/07/14 1327 10/31/14 1645  BNP 2584.7* 1931.3*    ProBNP (last 3 results) No results for input(s): PROBNP in the last 8760 hours.  CBG:  Recent Labs Lab 10/31/14 2012 10/31/14 2116 11/01/14 0612 11/01/14 1051 11/01/14 1155  GLUCAP 63* 128* 134* 168* 166*    Recent Results (from the past  240 hour(s))  MRSA PCR Screening     Status: Abnormal   Collection Time: 11/01/14 12:02 AM  Result Value Ref Range Status   MRSA by PCR POSITIVE (A) NEGATIVE Final    Comment:        The GeneXpert MRSA Assay (FDA approved for NASAL specimens only), is one component of a comprehensive MRSA colonization surveillance program. It is not intended to diagnose MRSA infection nor to guide or monitor treatment for MRSA infections. RESULT CALLED TO, READ BACK BY AND VERIFIED WITH: RASUL,G RN 0301 11/01/14 MITCHELL,L      Studies: Dg Chest 2 View  10/31/2014   CLINICAL DATA:  Shortness of breath since yesterday.  EXAM:  CHEST  2 VIEW  COMPARISON:  10/08/2014  FINDINGS: Cardiomegaly with vascular congestion. Small bilateral effusions. Continued mild interstitial prominence could reflect slight interstitial edema, not significantly changed since prior study. No acute bony abnormality.  IMPRESSION: Continued slight interstitial prominence could reflect interstitial edema. No real change. Small bilateral effusions.   Electronically Signed   By: Charlett NoseKevin  Dover M.D.   On: 10/31/2014 18:10   Ct Head Wo Contrast  10/31/2014   CLINICAL DATA:  Pt states she fell backwards, states walker flew out from under her.  EXAM: CT HEAD WITHOUT CONTRAST  CT CERVICAL SPINE WITHOUT CONTRAST  TECHNIQUE: Multidetector CT imaging of the head and cervical spine was performed following the standard protocol without intravenous contrast. Multiplanar CT image reconstructions of the cervical spine were also generated.  COMPARISON:  None.  FINDINGS: CT HEAD FINDINGS  No acute intracranial hemorrhage. No focal mass lesion. No CT evidence of acute infarction. No midline shift or mass effect. No hydrocephalus. Basilar cisterns are patent.  There is periventricular white matter hypodensities. There is generalized cortical atrophy.  Paranasal sinuses and  mastoid air cells are clear.  CT CERVICAL SPINE FINDINGS  No prevertebral soft tissue swelling. Normal alignment of cervical vertebral bodies. No loss of vertebral body height. Normal facet articulation. Normal craniocervical junction.  There is joint space narrowing and endplate osteophytosis at C5-C6 and C6-7.  No evidence epidural or paraspinal hematoma.  IMPRESSION: 1. No intracranial trauma. 2. Atrophy and microvascular disease. 3. No cervical spine fracture. 4. Disc osteophytic disease of the lower cervical spine.  Also the CT head cervical   Electronically Signed   By: Genevive BiStewart  Edmunds M.D.   On: 10/31/2014 19:12   Ct Cervical Spine Wo Contrast  10/31/2014   CLINICAL DATA:  Pt states she fell backwards,  states walker flew out from under her.  EXAM: CT HEAD WITHOUT CONTRAST  CT CERVICAL SPINE WITHOUT CONTRAST  TECHNIQUE: Multidetector CT imaging of the head and cervical spine was performed following the standard protocol without intravenous contrast. Multiplanar CT image reconstructions of the cervical spine were also generated.  COMPARISON:  None.  FINDINGS: CT HEAD FINDINGS  No acute intracranial hemorrhage. No focal mass lesion. No CT evidence of acute infarction. No midline shift or mass effect. No hydrocephalus. Basilar cisterns are patent.  There is periventricular white matter hypodensities. There is generalized cortical atrophy.  Paranasal sinuses and  mastoid air cells are clear.  CT CERVICAL SPINE FINDINGS  No prevertebral soft tissue swelling. Normal alignment of cervical vertebral bodies. No loss of vertebral body height. Normal facet articulation. Normal craniocervical junction.  There is joint space narrowing and endplate osteophytosis at C5-C6 and C6-7.  No evidence epidural or paraspinal hematoma.  IMPRESSION: 1. No intracranial trauma. 2. Atrophy and microvascular disease. 3.  No cervical spine fracture. 4. Disc osteophytic disease of the lower cervical spine.  Also the CT head cervical   Electronically Signed   By: Genevive Bi M.D.   On: 10/31/2014 19:12    Scheduled Meds: . amLODipine  10 mg Oral Daily  . ARIPiprazole  5 mg Oral Daily  . aspirin  325 mg Oral Daily  . atorvastatin  80 mg Oral QHS  . carvedilol  12.5 mg Oral BID WC  . Chlorhexidine Gluconate Cloth  6 each Topical Q0600  . clopidogrel  75 mg Oral Daily  . dorzolamide-timolol  1 drop Both Eyes Q12H  . famotidine  20 mg Oral QHS  . furosemide  80 mg Intravenous Daily  . heparin  5,000 Units Subcutaneous 3 times per day  . hydrALAZINE  75 mg Oral 3 times per day  . insulin aspart  0-9 Units Subcutaneous TID WC  . insulin glargine  15 Units Subcutaneous BID  . isosorbide dinitrate  20 mg Oral TID  . levothyroxine   100 mcg Oral QAC breakfast  . mupirocin ointment  1 application Nasal BID  . pantoprazole  20 mg Oral QAC breakfast  . prednisoLONE acetate  1 drop Left Eye QID  . sertraline  200 mg Oral QHS  . sodium chloride  3 mL Intravenous Q12H   Continuous Infusions:   Principal Problem:   CHF exacerbation Active Problems:   Diabetes mellitus without complication   CAD (coronary artery disease)   CKD (chronic kidney disease), stage II   Hypothyroidism   Dyslipidemia   GERD (gastroesophageal reflux disease)   Bradycardia   Essential hypertension   Acute on chronic heart failure    Time spent: 35 minutes.     Hartley Barefoot A  [facility] Pager [phone]. If 7PM-7AM, please contact night-coverage at [email or web address], password [facility] 11/01/2014, 2:00 PM  LOS: 1 day

## 2014-11-02 LAB — GLUCOSE, CAPILLARY
Glucose-Capillary: 119 mg/dL — ABNORMAL HIGH (ref 70–99)
Glucose-Capillary: 176 mg/dL — ABNORMAL HIGH (ref 70–99)
Glucose-Capillary: 135 mg/dL — ABNORMAL HIGH (ref 70–99)
Glucose-Capillary: 129 mg/dL — ABNORMAL HIGH (ref 70–99)

## 2014-11-02 LAB — BASIC METABOLIC PANEL
Anion gap: 8 (ref 5–15)
BUN: 33 mg/dL — AB (ref 6–23)
CHLORIDE: 105 mmol/L (ref 96–112)
CO2: 25 mmol/L (ref 19–32)
CREATININE: 1.97 mg/dL — AB (ref 0.50–1.10)
Calcium: 8.4 mg/dL (ref 8.4–10.5)
GFR calc non Af Amer: 25 mL/min — ABNORMAL LOW (ref >90)
GFR, EST AFRICAN AMERICAN: 29 mL/min — AB (ref >90)
Glucose, Bld: 120 mg/dL — ABNORMAL HIGH (ref 70–99)
Potassium: 3.5 mmol/L (ref 3.5–5.1)
Sodium: 138 mmol/L (ref 135–145)

## 2014-11-02 MED ORDER — HYDRALAZINE HCL 50 MG PO TABS
100.0000 mg | ORAL_TABLET | Freq: Three times a day (TID) | ORAL | Status: DC
  Administered 2014-11-02 – 2014-11-03 (×3): 100 mg via ORAL
  Filled 2014-11-02 (×5): qty 2

## 2014-11-02 NOTE — Progress Notes (Signed)
69 year old female- resident [facility].  Awaiting PT/OT evaluation to insure that patient is safe to return to assisted living.  SW Psychosocial Assessment to follow and will initiate Fl2.  Lorri Frederickonna T. Jaci LazierCrowder, KentuckyLCSW [phone]

## 2014-11-02 NOTE — Progress Notes (Signed)
OT Cancellation Note  Patient Details Name: Taylor Padilla MRN: [id number] DOB: 1945/11/02   Cancelled Treatment:    Reason Eval/Treat Not Completed: Other (comment). Pt not wanting to work with OT and states she had just gotten back in bed. Pt reports she feels fine with OT related tasks and OT explained it would be good to see if she is still able to do things as she did before she was in hospital and monitor O2.  Earlie RavelingStraub, Deg[place] L OTR/L [phone] 11/02/2014, 12:41 PM

## 2014-11-02 NOTE — Progress Notes (Signed)
[facility] PROGRESS NOTE  Taylor Padilla ZOX:[id number] DOB: Apr 08, 1946 DOA: 10/31/2014 PCP: No PCP Per Patient  Assessment/Plan: Acute Diastolic  Congestive heart failure exacerbation: 2-D echo on 10/08/14 showed EF 60-65%.  -Continue with IV lasix 80 mg qdaily -will continue home ASA, carvedilol - Not on ACEI due to CKD -will not get 2-D echo to evaluate EF since she just had one on 10/08/14. -strict In/Out -Daily weight. 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 No chest pain. Troponin negative -Continue aspirin and Plavix as well as statin  Bradycardia: Baseline heart rate 50s based on review of care everywhere records, stable -Continue current carvedilol dose: 12.5 mg bid  Diabetes mellitus without complication: A1c was 6.6 on 10/07/14 -decreased Lantus from 20 BID  to 15 units daily -Provide sliding scale insulin  Hypertension: -Continue Norvasc and hydralazine  -on Coreg  Anemia;  Follow Hb. Hb trend 7 to 8.   CKD, stage II: creatinine slightly higher than baseline (1.88-->1.96), likely due to worsening heart failure -Treat congestive heart failure exacerbation -follow-up renal function by BMP   Hypothyroidism -Continue Synthroid  Dyslipidemia -Continue statin  GERD  -cont PPI and pepcid   Acute respiratory failure -wean O2 as tolerated -was not on O2 at home  DVT ppx: SQ Heparin   Code Status: Full Code.  Family Communication: care discussed with patient.  Disposition Plan: ALF 2-3 days   Consultants:  none  Procedures:  none  Antibiotics:  none  HPI/Subjective: Feeling better, breathing better.   Objective: Filed Vitals:   11/02/14 1057  BP: 176/56  Pulse: 54  Temp:   Resp:     Intake/Output Summary (Last 24 hours) at 11/02/14 1145 Last data filed at 11/02/14 1100  Gross per 24 hour  Intake   1320 ml  Output   1000 ml  Net    320 ml   Filed Weights   10/31/14  2159 11/01/14 0504 11/02/14 0604  Weight: 71.305 kg (157 lb 3.2 oz) 71.24 kg (157 lb 0.9 oz) 68.3 kg (150 lb 9.2 oz)    Exam:   General:  Alert in no distress.   Cardiovascular: S 1, S 2 RRR  Respiratory: crackles bases.   Abdomen: bs present, soft, nt  Musculoskeletal: plus 2 edema.   Data Reviewed: Basic Metabolic Panel:  Recent Labs Lab 10/31/14 1645 11/01/14 0048 11/02/14 0350  NA 139 136 138  K 3.8 3.6 3.5  CL 104 105 105  CO2 GLUCOSE 90 242* 120*  BUN 33* 35* 33*  CREATININE 1.96* 1.90* 1.97*  CALCIUM 8.7 8.1* 8.4  MG  --  2.3  --    Liver Function Tests:  Recent Labs Lab 11/01/14 0048  AST 17  ALT 15  ALKPHOS 67  BILITOT 0.6  PROT 5.9*  ALBUMIN 2.8*   No results for input(s): LIPASE, AMYLASE in the last 168 hours. No results for input(s): AMMONIA in the last 168 hours. CBC:  Recent Labs Lab 10/31/14 1645 11/01/14 1030  WBC 7.3 6.9  NEUTROABS 5.7  --   HGB 7.8* 7.8*  HCT 24.9* 25.0*  MCV 90.5 90.6  PLT 243 220   Cardiac Enzymes: No results for input(s): CKTOTAL, CKMB, CKMBINDEX, TROPONINI in the last 168 hours. BNP (last 3 results)  Recent Labs  10/07/14 1327 10/31/14 1645  BNP 2584.7* 1931.3*    ProBNP (last 3 results) No results for input(s): PROBNP in  the last 8760 hours.  CBG:  Recent Labs Lab 11/01/14 1051 11/01/14 1155 11/01/14 1558 11/01/14 2140 11/02/14 0625  GLUCAP 168* 166* 204* 136* 119*    Recent Results (from the past 240 hour(s))  MRSA PCR Screening     Status: Abnormal   Collection Time: 11/01/14 12:02 AM  Result Value Ref Range Status   MRSA by PCR POSITIVE (A) NEGATIVE Final    Comment:        The GeneXpert MRSA Assay (FDA approved for NASAL specimens only), is one component of a comprehensive MRSA colonization surveillance program. It is not intended to diagnose MRSA infection nor to guide or monitor treatment for MRSA infections. RESULT CALLED TO, READ BACK BY AND VERIFIED  WITH: RASUL,G RN 0301 11/01/14 MITCHELL,L      Studies: Dg Chest 2 View  10/31/2014   CLINICAL DATA:  Shortness of breath since yesterday.  EXAM: CHEST  2 VIEW  COMPARISON:  10/08/2014  FINDINGS: Cardiomegaly with vascular congestion. Small bilateral effusions. Continued mild interstitial prominence could reflect slight interstitial edema, not significantly changed since prior study. No acute bony abnormality.  IMPRESSION: Continued slight interstitial prominence could reflect interstitial edema. No real change. Small bilateral effusions.   Electronically Signed   By: Charlett Nose M.D.   On: 10/31/2014 18:10   Ct Head Wo Contrast  10/31/2014   CLINICAL DATA:  Pt states she fell backwards, states walker flew out from under her.  EXAM: CT HEAD WITHOUT CONTRAST  CT CERVICAL SPINE WITHOUT CONTRAST  TECHNIQUE: Multidetector CT imaging of the head and cervical spine was performed following the standard protocol without intravenous contrast. Multiplanar CT image reconstructions of the cervical spine were also generated.  COMPARISON:  None.  FINDINGS: CT HEAD FINDINGS  No acute intracranial hemorrhage. No focal mass lesion. No CT evidence of acute infarction. No midline shift or mass effect. No hydrocephalus. Basilar cisterns are patent.  There is periventricular white matter hypodensities. There is generalized cortical atrophy.  Paranasal sinuses and  mastoid air cells are clear.  CT CERVICAL SPINE FINDINGS  No prevertebral soft tissue swelling. Normal alignment of cervical vertebral bodies. No loss of vertebral body height. Normal facet articulation. Normal craniocervical junction.  There is joint space narrowing and endplate osteophytosis at C5-C6 and C6-7.  No evidence epidural or paraspinal hematoma.  IMPRESSION: 1. No intracranial trauma. 2. Atrophy and microvascular disease. 3. No cervical spine fracture. 4. Disc osteophytic disease of the lower cervical spine.  Also the CT head cervical   Electronically  Signed   By: Genevive Bi M.D.   On: 10/31/2014 19:12   Ct Cervical Spine Wo Contrast  10/31/2014   CLINICAL DATA:  Pt states she fell backwards, states walker flew out from under her.  EXAM: CT HEAD WITHOUT CONTRAST  CT CERVICAL SPINE WITHOUT CONTRAST  TECHNIQUE: Multidetector CT imaging of the head and cervical spine was performed following the standard protocol without intravenous contrast. Multiplanar CT image reconstructions of the cervical spine were also generated.  COMPARISON:  None.  FINDINGS: CT HEAD FINDINGS  No acute intracranial hemorrhage. No focal mass lesion. No CT evidence of acute infarction. No midline shift or mass effect. No hydrocephalus. Basilar cisterns are patent.  There is periventricular white matter hypodensities. There is generalized cortical atrophy.  Paranasal sinuses and  mastoid air cells are clear.  CT CERVICAL SPINE FINDINGS  No prevertebral soft tissue swelling. Normal alignment of cervical vertebral bodies. No loss of vertebral body height. Normal facet articulation.  Normal craniocervical junction.  There is joint space narrowing and endplate osteophytosis at C5-C6 and C6-7.  No evidence epidural or paraspinal hematoma.  IMPRESSION: 1. No intracranial trauma. 2. Atrophy and microvascular disease. 3. No cervical spine fracture. 4. Disc osteophytic disease of the lower cervical spine.  Also the CT head cervical   Electronically Signed   By: Genevive BiStewart  Edmunds M.D.   On: 10/31/2014 19:12    Scheduled Meds: . amLODipine  10 mg Oral Daily  . ARIPiprazole  5 mg Oral Daily  . aspirin  325 mg Oral Daily  . atorvastatin  80 mg Oral QHS  . carvedilol  12.5 mg Oral BID WC  . Chlorhexidine Gluconate Cloth  6 each Topical Q0600  . clopidogrel  75 mg Oral Daily  . dorzolamide-timolol  1 drop Both Eyes Q12H  . famotidine  20 mg Oral QHS  . furosemide  80 mg Intravenous Daily  . heparin  5,000 Units Subcutaneous 3 times per day  . hydrALAZINE  75 mg Oral 3 times per day  .  insulin aspart  0-9 Units Subcutaneous TID WC  . insulin glargine  15 Units Subcutaneous Daily  . isosorbide dinitrate  20 mg Oral TID  . levothyroxine  100 mcg Oral QAC breakfast  . mupirocin ointment  1 application Nasal BID  . pantoprazole  20 mg Oral QAC breakfast  . prednisoLONE acetate  1 drop Left Eye QID  . sertraline  200 mg Oral QHS  . sodium chloride  3 mL Intravenous Q12H   Continuous Infusions:   Principal Problem:   CHF exacerbation Active Problems:   Diabetes mellitus without complication   CAD (coronary artery disease)   CKD (chronic kidney disease), stage II   Hypothyroidism   Dyslipidemia   GERD (gastroesophageal reflux disease)   Bradycardia   Essential hypertension   Acute on chronic heart failure    Time spent: 35 minutes.     Taylor Padilla, Taylor Padilla  [facility] Pager [phone]. If 7PM-7AM, please contact night-coverage at [email or web address], password [email or web address] 11/02/2014, 11:45 AM  LOS: 2 days

## 2014-11-02 NOTE — Progress Notes (Signed)
PT Cancellation/Discharge Note  Patient Details Name: Taylor HueDorcas Seelig MRN: [id number] DOB: Jan 13, 1946   Cancelled Treatment:    Reason Eval/Treat Not Completed: PT screened, no needs identified, will sign off Patient reports she has been up to bathroom and does not need assist for ambulation at home.  Reports "I only need help with my medications because I can't see them.  Other than that I am independent"  Patient declined PT services in acute care setting and once discharged back to ALF.  PT will sign off.  Vena AustriaDavis, Harmonie Verrastro H 11/02/2014, 5:33 PM Durenda HurtSusan H. Renaldo Fiddleravis, PT, [facility] HospitalM[facility] Pager [phone]

## 2014-11-02 NOTE — Progress Notes (Signed)
BP 178/64 manually. MD notified and made aware of BP recheck. New orders given. Patient continues to be asymptomatic. No verbal complaints and no signs or symptoms of distress or discomfort. Will continue to monitor patient for further changes in condition.

## 2014-11-02 NOTE — Progress Notes (Signed)
Utilization review completed. Hodan Wurtz, RN, BSN. 

## 2014-11-02 NOTE — Clinical Documentation Improvement (Signed)
Pt admitted with acute on chronic diastolic heart failure.  ED note states patient not normally on oxygen.   O2 sats in ED 89 requiring O2 2L via [place]; patient currently remain on O2 at 2L. Please document in your progress note any additional clinical conditions associated with the oxygen requirment, if any, and carry over to the discharge summary.    Possible Clinical Conditions: -Acute respiratory failure -Other condition (please specify) -Acute on chronic diastolic heart failure only -Unable to determine at present  Thank you, Doy MinceVangela Raelynn Corron, RN [phone] Clinical Documentation Specialist

## 2014-11-02 NOTE — Progress Notes (Signed)
BP 176/56 and HR 54. Patient asymptomatic. MD notified and made aware. OK to give scheduled meds, but to hold Coreg. Will continue to monitor patient for further changes in condition.

## 2014-11-02 NOTE — Progress Notes (Signed)
BP 180/51. MD notified. Told to obtain a manual pressure. Patient refused BP recheck due to her being upset. MD paged once again to make her aware of refusal of BP recheck and her threatening to leave hospital. Will allow patient time to calm herself and come to further assess.

## 2014-11-03 LAB — BASIC METABOLIC PANEL
Anion gap: 10 (ref 5–15)
BUN: 31 mg/dL — ABNORMAL HIGH (ref 6–23)
CALCIUM: 8.6 mg/dL (ref 8.4–10.5)
CO2: 29 mmol/L (ref 19–32)
CREATININE: 1.92 mg/dL — AB (ref 0.50–1.10)
Chloride: 101 mmol/L (ref 96–112)
GFR calc Af Amer: 30 mL/min — ABNORMAL LOW (ref >90)
GFR calc non Af Amer: 26 mL/min — ABNORMAL LOW (ref >90)
Glucose, Bld: 115 mg/dL — ABNORMAL HIGH (ref 70–99)
Potassium: 3.5 mmol/L (ref 3.5–5.1)
SODIUM: 140 mmol/L (ref 135–145)

## 2014-11-03 LAB — CBC
HCT: 25.9 % — ABNORMAL LOW (ref 36.0–46.0)
Hemoglobin: 8.2 g/dL — ABNORMAL LOW (ref 12.0–15.0)
MCH: 29.3 pg (ref 26.0–34.0)
MCHC: 31.7 g/dL (ref 30.0–36.0)
MCV: 92.5 fL (ref 78.0–100.0)
Platelets: 232 10*3/uL (ref 150–400)
RBC: 2.8 MIL/uL — AB (ref 3.87–5.11)
RDW: 15.9 % — ABNORMAL HIGH (ref 11.5–15.5)
WBC: 6.9 10*3/uL (ref 4.0–10.5)

## 2014-11-03 LAB — GLUCOSE, CAPILLARY
Glucose-Capillary: 117 mg/dL — ABNORMAL HIGH (ref 70–99)
Glucose-Capillary: 256 mg/dL — ABNORMAL HIGH (ref 70–99)

## 2014-11-03 MED ORDER — HYDRALAZINE HCL 100 MG PO TABS: 100.0000 mg | ORAL_TABLET | Freq: Three times a day (TID) | ORAL | Status: AC

## 2014-11-03 MED ORDER — FUROSEMIDE 80 MG PO TABS
80.0000 mg | ORAL_TABLET | Freq: Two times a day (BID) | ORAL | Status: DC
  Filled 2014-11-03 (×2): qty 1

## 2014-11-03 MED ORDER — TRAMADOL HCL 50 MG PO TABS: 50.0000 mg | ORAL_TABLET | Freq: Four times a day (QID) | ORAL | Status: DC | PRN

## 2014-11-03 MED ORDER — CARVEDILOL 12.5 MG PO TABS
12.5000 mg | ORAL_TABLET | Freq: Two times a day (BID) | ORAL | Status: DC
  Filled 2014-11-03 (×3): qty 1

## 2014-11-03 MED ORDER — FUROSEMIDE 80 MG PO TABS: 80.0000 mg | ORAL_TABLET | Freq: Every day | ORAL | Status: DC

## 2014-11-03 MED ORDER — FUROSEMIDE 80 MG PO TABS: 80.0000 mg | ORAL_TABLET | Freq: Two times a day (BID) | ORAL | Status: DC

## 2014-11-03 NOTE — Progress Notes (Signed)
OT Cancellation Note  Patient Details Name: Taylor Padilla MRN: [id number] DOB: 06-16-1946   Cancelled Treatment:    Reason Eval/Treat Not Completed: Patient declined, no reason specified. Pt politely declined OT again today on a second attempt. Will sign off  Galen ManilaSpencer, Zadin Lange Jeanette 11/03/2014, 10:51 AM

## 2014-11-03 NOTE — Discharge Summary (Addendum)
Physician Discharge Summary  Taylor Padilla WGN:[id number] DOB: July 29, 1946 DOA: 10/31/2014  PCP: No PCP Per Patient  Admit date: 10/31/2014 Discharge date: 11/03/2014  Time spent: 35 minutes  Recommendations for Outpatient Follow-up:  1. Weight patient daily 2. Outpatient referral to geripsych for depression  Discharge Diagnoses:  Principal Problem:   CHF exacerbation Active Problems:   Diabetes mellitus without complication   CAD (coronary artery disease)   CKD (chronic kidney disease), stage II   Hypothyroidism   Dyslipidemia   GERD (gastroesophageal reflux disease)   Bradycardia   Essential hypertension   Acute on chronic heart failure   Discharge Condition: improved  Diet recommendation: cardiac/diabetic  Filed Weights   11/02/14 0604 11/03/14 0500 11/03/14 0622  Weight: 68.3 kg (150 lb 9.2 oz) 68.765 kg (151 lb 9.6 oz) 68.675 kg (151 lb 6.4 oz)    History of present illness:  Patient was recently hospitalized from 2/3-2/6 because of diastolic congestive heart failure exacerbation. She was successfully treated in the hospital and discharged on 80 mg Lasix daily. Her weight was 65.4 kg at discharge. Patient is supposed to take 80 mg Lasix daily, but she is actually getting 80 mg every other day and 40 mg on the alternate days. Patient has worsening shortness of breath and leg edema, and her body weight increased from 65.4 kg to 72.3 kg. Sshe also has orthopnea. She has mild dry cough, but no chest pain, fever or chills.  She also notes that she fell backwards yesterday. She states her walker slipped out and "flew backwards" hitting the back of her head and neck on a wall. Denies any pain since the event but notes she began to feel dizzy last night, described as her equilibrium being off. Patient does not have unilateral weakness, numbness or tingling sensations. CT- head and C-spine are negative for acute abnormalities. Patient does not have nausea, vomiting, diarrhea,  abdominal pain. No symptoms of UTI.  In ED, patient was found to have elevated BNP at 1937, negative troponin, stable renal function, potassium 3.8, temperature normal. Chest x-ray showed interstitial edema. Patient is admitted to inpatient for further evaluation and treatment.   Hospital Course:  Acute Diastolic Congestive heart failure exacerbation: 2-D echo on 10/08/14 showed EF 60-65%.  -Continue with  lasix 80 mg qdaily -will continue home ASA, carvedilol - Not on ACEI due to CKD. 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 No chest pain. Troponin negative -Continue aspirin and Plavix as well as statin  Bradycardia: Baseline heart rate 50s based on review of care everywhere records, stable -Continue current carvedilol dose: 12.5 mg bid  Diabetes mellitus without complication: A1c was 6.6 on 10/07/14 -resume home meds  Hypertension: -Continue Norvasc and hydralazine  -on Coreg  Anemia;  Follow Hb. Hb trend 7 to 8.   CKD, stage II: creatinine at baseline (1.88-->1.96), likely due to worsening heart failure BMP 1 week with BP check  Hypothyroidism -Continue Synthroid  Dyslipidemia -Continue statin  GERD  -cont PPI and pepcid  Acute respiratory failure -off O2  Procedures:    Consultations:    Discharge Exam: Filed Vitals:   11/03/14 1300  BP: 160/60  Pulse: 59  Temp: 98 F (36.7 C)  Resp: 18    General: anxious to be d/c'd Cardiovascular: rrr Respiratory: clear  Discharge Instructions   Discharge Instructions    (HEART FAILURE PATIENTS) Call MD:  Anytime you have any  of the following symptoms: 1) 3 pound weight gain in 24 hours or 5 pounds in 1 week 2) shortness of breath, with or without a dry hacking cough 3) swelling in the hands, feet or stomach 4) if you have to sleep on extra pillows at night in order to breathe.    Complete by:  As directed      Diet - low sodium heart  healthy    Complete by:  As directed      Diet Carb Modified    Complete by:  As directed      Increase activity slowly    Complete by:  As directed           Current Discharge Medication List    CONTINUE these medications which have CHANGED   Details  furosemide (LASIX) 80 MG tablet Take 1 tablet (80 mg total) by mouth daily. Qty: 60 tablet, Refills: 0    hydrALAZINE (APRESOLINE) 100 MG tablet Take 1 tablet (100 mg total) by mouth every 8 (eight) hours. Qty: 90 tablet, Refills: 0    traMADol (ULTRAM) 50 MG tablet Take 1 tablet (50 mg total) by mouth every 6 (six) hours as needed for moderate pain. Qty: 30 tablet, Refills: 0      CONTINUE these medications which have NOT CHANGED   Details  amLODipine (NORVASC) 10 MG tablet Take 10 mg by mouth daily.    ARIPiprazole (ABILIFY) 5 MG tablet Take 5 mg by mouth daily.    aspirin 325 MG tablet Take 325 mg by mouth daily.    atorvastatin (LIPITOR) 80 MG tablet Take 80 mg by mouth at bedtime.    carvedilol (COREG) 12.5 MG tablet Take 1 tablet (12.5 mg total) by mouth 2 (two) times daily with a meal. Qty: 60 tablet, Refills: 0    clopidogrel (PLAVIX) 75 MG tablet Take 75 mg by mouth daily.    dorzolamide-timolol (COSOPT) 22.3-6.8 MG/ML ophthalmic solution Place 1 drop into both eyes every 12 (twelve) hours.    insulin aspart (NOVOLOG) 100 UNIT/ML injection Inject 5 Units into the skin 3 (three) times daily with meals.    isosorbide dinitrate (ISORDIL) 10 MG tablet Take 2 tablets (20 mg total) by mouth 3 (three) times daily.    lansoprazole (PREVACID) 30 MG capsule Take 30 mg by mouth daily.    LANTUS SOLOSTAR 100 UNIT/ML Solostar Pen Inject 20 Units into the skin 2 (two) times daily.    levothyroxine (SYNTHROID, LEVOTHROID) 100 MCG tablet Take 1 tablet (100 mcg total) by mouth daily before breakfast. Qty: 30 tablet, Refills: 0    Polyethyl Glycol-Propyl Glycol 0.4-0.3 % SOLN Apply 1 drop to eye 2 (two) times daily.     prednisoLONE acetate (PRED FORTE) 1 % ophthalmic suspension Place 1 drop into the left eye 4 (four) times daily.    sertraline (ZOLOFT) 100 MG tablet Take 200 mg by mouth at bedtime.    ranitidine (ZANTAC) 150 MG tablet Take 150 mg by mouth 2 (two) times daily as needed for heartburn. indigestion       Allergies  Allergen Reactions  . Epinephrine Other (See Comments)    Patient says she is not allergic to epi, but that it makes her jittery.  WILL RECEIVE IF NEEDED IN AN EMERGENCY.   Follow-up Information    Please follow up.   Why:  PCP 1 week for BMP/ BP check-- outpatient referral to geriatric psych for depression       The results of significant  diagnostics from this hospitalization (including imaging, microbiology, ancillary and laboratory) are listed below for reference.    Significant Diagnostic Studies: Dg Chest 2 View  10/31/2014   CLINICAL DATA:  Shortness of breath since yesterday.  EXAM: CHEST  2 VIEW  COMPARISON:  10/08/2014  FINDINGS: Cardiomegaly with vascular congestion. Small bilateral effusions. Continued mild interstitial prominence could reflect slight interstitial edema, not significantly changed since prior study. No acute bony abnormality.  IMPRESSION: Continued slight interstitial prominence could reflect interstitial edema. No real change. Small bilateral effusions.   Electronically Signed   By: Charlett NoseKevin  Dover M.D.   On: 10/31/2014 18:10   Ct Head Wo Contrast  10/31/2014   CLINICAL DATA:  Pt states she fell backwards, states walker flew out from under her.  EXAM: CT HEAD WITHOUT CONTRAST  CT CERVICAL SPINE WITHOUT CONTRAST  TECHNIQUE: Multidetector CT imaging of the head and cervical spine was performed following the standard protocol without intravenous contrast. Multiplanar CT image reconstructions of the cervical spine were also generated.  COMPARISON:  None.  FINDINGS: CT HEAD FINDINGS  No acute intracranial hemorrhage. No focal mass lesion. No CT evidence of  acute infarction. No midline shift or mass effect. No hydrocephalus. Basilar cisterns are patent.  There is periventricular white matter hypodensities. There is generalized cortical atrophy.  Paranasal sinuses and  mastoid air cells are clear.  CT CERVICAL SPINE FINDINGS  No prevertebral soft tissue swelling. Normal alignment of cervical vertebral bodies. No loss of vertebral body height. Normal facet articulation. Normal craniocervical junction.  There is joint space narrowing and endplate osteophytosis at C5-C6 and C6-7.  No evidence epidural or paraspinal hematoma.  IMPRESSION: 1. No intracranial trauma. 2. Atrophy and microvascular disease. 3. No cervical spine fracture. 4. Disc osteophytic disease of the lower cervical spine.  Also the CT head cervical   Electronically Signed   By: Genevive BiStewart  Edmunds M.D.   On: 10/31/2014 19:12   Ct Cervical Spine Wo Contrast  10/31/2014   CLINICAL DATA:  Pt states she fell backwards, states walker flew out from under her.  EXAM: CT HEAD WITHOUT CONTRAST  CT CERVICAL SPINE WITHOUT CONTRAST  TECHNIQUE: Multidetector CT imaging of the head and cervical spine was performed following the standard protocol without intravenous contrast. Multiplanar CT image reconstructions of the cervical spine were also generated.  COMPARISON:  None.  FINDINGS: CT HEAD FINDINGS  No acute intracranial hemorrhage. No focal mass lesion. No CT evidence of acute infarction. No midline shift or mass effect. No hydrocephalus. Basilar cisterns are patent.  There is periventricular white matter hypodensities. There is generalized cortical atrophy.  Paranasal sinuses and  mastoid air cells are clear.  CT CERVICAL SPINE FINDINGS  No prevertebral soft tissue swelling. Normal alignment of cervical vertebral bodies. No loss of vertebral body height. Normal facet articulation. Normal craniocervical junction.  There is joint space narrowing and endplate osteophytosis at C5-C6 and C6-7.  No evidence epidural or  paraspinal hematoma.  IMPRESSION: 1. No intracranial trauma. 2. Atrophy and microvascular disease. 3. No cervical spine fracture. 4. Disc osteophytic disease of the lower cervical spine.  Also the CT head cervical   Electronically Signed   By: Genevive BiStewart  Edmunds M.D.   On: 10/31/2014 19:12   Dg Chest Port 1 View  10/08/2014   CLINICAL DATA:  CHF  EXAM: PORTABLE CHEST - 1 VIEW  COMPARISON:  10/07/2014.  FINDINGS: Cardiomegaly with normal pulmonary vascularity. Prior CABG. Interim partial clearing of bilateral pulmonary infiltrates and pleural effusions consistent  consistent with partial clearing of CHF. No pneumothorax. No acute osseus abnormality.  IMPRESSION: Partial clearing of congestive heart failure with partial clearing of bilateral pulmonary edema and bilateral pleural effusions. Prior CABG.   Electronically Signed   By: Maisie Fus  Register   On: 10/08/2014 08:08   Dg Chest Port 1 View  10/07/2014   CLINICAL DATA:  Shortness of breath  EXAM: PORTABLE CHEST - 1 VIEW  COMPARISON:  None available  FINDINGS: Previous CABG. Interstitial and airspace opacities in both lung bases, right greater than left. Small pleural effusions. Mild cardiomegaly. Atheromatous aorta. No pneumothorax.  IMPRESSION: 1. Mild cardiomegaly with bibasilar infiltrate/edema and small effusions, suspect CHF.   Electronically Signed   By: Oley Balm M.D.   On: 10/07/2014 13:33    Microbiology: Recent Results (from the past 240 hour(s))  MRSA PCR Screening     Status: Abnormal   Collection Time: 11/01/14 12:02 AM  Result Value Ref Range Status   MRSA by PCR POSITIVE (A) NEGATIVE Final    Comment:        The GeneXpert MRSA Assay (FDA approved for NASAL specimens only), is one component of a comprehensive MRSA colonization surveillance program. It is not intended to diagnose MRSA infection nor to guide or monitor treatment for MRSA infections. RESULT CALLED TO, READ BACK BY AND VERIFIED WITH: RASUL,G RN 0301 11/01/14  MITCHELL,L      Labs: Basic Metabolic Panel:  Recent Labs Lab 10/31/14 1645 11/01/14 0048 11/02/14 0350 11/03/14 0620  NA 139 136 138 140  K 3.8 3.6 3.5 3.5  CL 104 105 105 101  CO2 GLUCOSE 90 242* 120* 115*  BUN 33* 35* 33* 31*  CREATININE 1.96* 1.90* 1.97* 1.92*  CALCIUM 8.7 8.1* 8.4 8.6  MG  --  2.3  --   --    Liver Function Tests:  Recent Labs Lab 11/01/14 0048  AST 17  ALT 15  ALKPHOS 67  BILITOT 0.6  PROT 5.9*  ALBUMIN 2.8*   No results for input(s): LIPASE, AMYLASE in the last 168 hours. No results for input(s): AMMONIA in the last 168 hours. CBC:  Recent Labs Lab 10/31/14 1645 11/01/14 1030 11/03/14 0620  WBC 7.3 6.9 6.9  NEUTROABS 5.7  --   --   HGB 7.8* 7.8* 8.2*  HCT 24.9* 25.0* 25.9*  MCV 90.5 90.6 92.5  PLT 243 220 232   Cardiac Enzymes: No results for input(s): CKTOTAL, CKMB, CKMBINDEX, TROPONINI in the last 168 hours. BNP: BNP (last 3 results)  Recent Labs  10/07/14 1327 10/31/14 1645  BNP 2584.7* 1931.3*    ProBNP (last 3 results) No results for input(s): PROBNP in the last 8760 hours.  CBG:  Recent Labs Lab 11/02/14 1146 11/02/14 1621 11/02/14 2138 11/03/14 0616 11/03/14 1152  GLUCAP 176* 135* 129* 117* 256*       Signed:  Benjamine Mola, Illeana Edick  [facility] 11/03/2014, 1:47 PM

## 2014-11-03 NOTE — Progress Notes (Addendum)
DC IV, DC Tele, DC Assisted Living . Discharge instructions and home medications and all paperwork handled by Child psychotherapistsocial worker. Patient leaving unit via wheelchair and appears in no acute distress.

## 2014-11-03 NOTE — Care Management Note (Signed)
CARE MANAGEMENT NOTE 11/03/2014  Patient:  Taylor Padilla,Taylor Padilla   Account Number:  [id number]  Date Initiated:  11/03/2014  Documentation initiated by:  Kyland No  Subjective/Objective Assessment:   CM following for progression and d/c planning.     Action/Plan:   Anticipated DC Date:  11/03/2014   Anticipated DC Plan:  ASSISTED LIVING / REST HOME         Choice offered to / List presented to:             Status of service:  Completed, signed off Medicare Important Message given?  YES (If response is "NO", the following Medicare IM given date fields will be blank) Date Medicare IM given:  11/03/2014 Medicare IM given by:  Parris Signer Date Additional Medicare IM given:   Additional Medicare IM given by:    Discharge Disposition:  ASSISTED LIVING  Per UR Regulation:    If discussed at Long Length of Stay Meetings, dates discussed:    Comments:

## 2014-11-04 ENCOUNTER — Encounter (HOSPITAL_COMMUNITY): Payer: Medicare PPO

## 2014-11-19 ENCOUNTER — Emergency Department (HOSPITAL_COMMUNITY): Payer: Medicare PPO

## 2014-11-19 ENCOUNTER — Inpatient Hospital Stay (HOSPITAL_COMMUNITY)
Admission: EM | Admit: 2014-11-19 | Discharge: 2014-11-26 | DRG: 291 | Payer: Medicare PPO | Attending: Internal Medicine | Admitting: Internal Medicine

## 2014-11-19 ENCOUNTER — Encounter (HOSPITAL_COMMUNITY): Payer: Self-pay | Admitting: General Practice

## 2014-11-19 DIAGNOSIS — E780 Pure hypercholesterolemia: Secondary | ICD-10-CM | POA: Diagnosis present

## 2014-11-19 DIAGNOSIS — E1165 Type 2 diabetes mellitus with hyperglycemia: Secondary | ICD-10-CM | POA: Diagnosis present

## 2014-11-19 DIAGNOSIS — Z9842 Cataract extraction status, left eye: Secondary | ICD-10-CM | POA: Diagnosis not present

## 2014-11-19 DIAGNOSIS — N281 Cyst of kidney, acquired: Secondary | ICD-10-CM | POA: Diagnosis present

## 2014-11-19 DIAGNOSIS — R7881 Bacteremia: Secondary | ICD-10-CM | POA: Diagnosis present

## 2014-11-19 DIAGNOSIS — Z9841 Cataract extraction status, right eye: Secondary | ICD-10-CM

## 2014-11-19 DIAGNOSIS — I2510 Atherosclerotic heart disease of native coronary artery without angina pectoris: Secondary | ICD-10-CM | POA: Diagnosis present

## 2014-11-19 DIAGNOSIS — H548 Legal blindness, as defined in USA: Secondary | ICD-10-CM | POA: Diagnosis present

## 2014-11-19 DIAGNOSIS — I429 Cardiomyopathy, unspecified: Secondary | ICD-10-CM | POA: Diagnosis present

## 2014-11-19 DIAGNOSIS — D509 Iron deficiency anemia, unspecified: Secondary | ICD-10-CM | POA: Diagnosis present

## 2014-11-19 DIAGNOSIS — Z888 Allergy status to other drugs, medicaments and biological substances status: Secondary | ICD-10-CM | POA: Diagnosis not present

## 2014-11-19 DIAGNOSIS — E039 Hypothyroidism, unspecified: Secondary | ICD-10-CM | POA: Diagnosis present

## 2014-11-19 DIAGNOSIS — N183 Chronic kidney disease, stage 3 (moderate): Secondary | ICD-10-CM | POA: Diagnosis present

## 2014-11-19 DIAGNOSIS — N179 Acute kidney failure, unspecified: Secondary | ICD-10-CM | POA: Diagnosis present

## 2014-11-19 DIAGNOSIS — Z8673 Personal history of transient ischemic attack (TIA), and cerebral infarction without residual deficits: Secondary | ICD-10-CM

## 2014-11-19 DIAGNOSIS — I10 Essential (primary) hypertension: Secondary | ICD-10-CM | POA: Diagnosis present

## 2014-11-19 DIAGNOSIS — R001 Bradycardia, unspecified: Secondary | ICD-10-CM | POA: Diagnosis present

## 2014-11-19 DIAGNOSIS — Z7902 Long term (current) use of antithrombotics/antiplatelets: Secondary | ICD-10-CM

## 2014-11-19 DIAGNOSIS — E871 Hypo-osmolality and hyponatremia: Secondary | ICD-10-CM | POA: Diagnosis present

## 2014-11-19 DIAGNOSIS — R509 Fever, unspecified: Secondary | ICD-10-CM

## 2014-11-19 DIAGNOSIS — E662 Morbid (severe) obesity with alveolar hypoventilation: Secondary | ICD-10-CM | POA: Diagnosis present

## 2014-11-19 DIAGNOSIS — Z951 Presence of aortocoronary bypass graft: Secondary | ICD-10-CM | POA: Diagnosis not present

## 2014-11-19 DIAGNOSIS — E785 Hyperlipidemia, unspecified: Secondary | ICD-10-CM | POA: Diagnosis present

## 2014-11-19 DIAGNOSIS — I129 Hypertensive chronic kidney disease with stage 1 through stage 4 chronic kidney disease, or unspecified chronic kidney disease: Secondary | ICD-10-CM | POA: Diagnosis present

## 2014-11-19 DIAGNOSIS — Z7982 Long term (current) use of aspirin: Secondary | ICD-10-CM | POA: Diagnosis not present

## 2014-11-19 DIAGNOSIS — R0602 Shortness of breath: Secondary | ICD-10-CM | POA: Diagnosis present

## 2014-11-19 DIAGNOSIS — IMO0002 Reserved for concepts with insufficient information to code with codable children: Secondary | ICD-10-CM

## 2014-11-19 DIAGNOSIS — B9562 Methicillin resistant Staphylococcus aureus infection as the cause of diseases classified elsewhere: Secondary | ICD-10-CM | POA: Diagnosis present

## 2014-11-19 DIAGNOSIS — Z961 Presence of intraocular lens: Secondary | ICD-10-CM | POA: Diagnosis present

## 2014-11-19 DIAGNOSIS — F329 Major depressive disorder, single episode, unspecified: Secondary | ICD-10-CM | POA: Diagnosis present

## 2014-11-19 DIAGNOSIS — Z6831 Body mass index (BMI) 31.0-31.9, adult: Secondary | ICD-10-CM

## 2014-11-19 DIAGNOSIS — I272 Other secondary pulmonary hypertension: Secondary | ICD-10-CM | POA: Diagnosis present

## 2014-11-19 DIAGNOSIS — E1121 Type 2 diabetes mellitus with diabetic nephropathy: Secondary | ICD-10-CM | POA: Diagnosis present

## 2014-11-19 DIAGNOSIS — B9561 Methicillin susceptible Staphylococcus aureus infection as the cause of diseases classified elsewhere: Secondary | ICD-10-CM | POA: Diagnosis not present

## 2014-11-19 DIAGNOSIS — I5031 Acute diastolic (congestive) heart failure: Secondary | ICD-10-CM | POA: Diagnosis not present

## 2014-11-19 DIAGNOSIS — J9601 Acute respiratory failure with hypoxia: Secondary | ICD-10-CM | POA: Diagnosis present

## 2014-11-19 DIAGNOSIS — Z794 Long term (current) use of insulin: Secondary | ICD-10-CM

## 2014-11-19 DIAGNOSIS — R195 Other fecal abnormalities: Secondary | ICD-10-CM | POA: Diagnosis present

## 2014-11-19 DIAGNOSIS — I5033 Acute on chronic diastolic (congestive) heart failure: Principal | ICD-10-CM

## 2014-11-19 DIAGNOSIS — I878 Other specified disorders of veins: Secondary | ICD-10-CM

## 2014-11-19 DIAGNOSIS — K219 Gastro-esophageal reflux disease without esophagitis: Secondary | ICD-10-CM | POA: Diagnosis present

## 2014-11-19 DIAGNOSIS — N182 Chronic kidney disease, stage 2 (mild): Secondary | ICD-10-CM

## 2014-11-19 LAB — CBG MONITORING, ED: GLUCOSE-CAPILLARY: 99 mg/dL (ref 70–99)

## 2014-11-19 LAB — CBC WITH DIFFERENTIAL/PLATELET
BASOS ABS: 0 10*3/uL (ref 0.0–0.1)
BASOS PCT: 0 % (ref 0–1)
Eosinophils Absolute: 0.1 10*3/uL (ref 0.0–0.7)
Eosinophils Relative: 2 % (ref 0–5)
HCT: 24.7 % — ABNORMAL LOW (ref 36.0–46.0)
Hemoglobin: 7.6 g/dL — ABNORMAL LOW (ref 12.0–15.0)
Lymphocytes Relative: 10 % — ABNORMAL LOW (ref 12–46)
Lymphs Abs: 0.7 10*3/uL (ref 0.7–4.0)
MCH: 28 pg (ref 26.0–34.0)
MCHC: 30.8 g/dL (ref 30.0–36.0)
MCV: 91.1 fL (ref 78.0–100.0)
MONO ABS: 0.7 10*3/uL (ref 0.1–1.0)
Monocytes Relative: 11 % (ref 3–12)
NEUTROS ABS: 5.2 10*3/uL (ref 1.7–7.7)
NEUTROS PCT: 77 % (ref 43–77)
PLATELETS: 304 10*3/uL (ref 150–400)
RBC: 2.71 MIL/uL — AB (ref 3.87–5.11)
RDW: 16.5 % — ABNORMAL HIGH (ref 11.5–15.5)
WBC: 6.8 10*3/uL (ref 4.0–10.5)

## 2014-11-19 LAB — RETICULOCYTES
RBC.: 2.73 MIL/uL — ABNORMAL LOW (ref 3.87–5.11)
RETIC COUNT ABSOLUTE: 92.8 10*3/uL (ref 19.0–186.0)
Retic Ct Pct: 3.4 % — ABNORMAL HIGH (ref 0.4–3.1)

## 2014-11-19 LAB — BASIC METABOLIC PANEL
ANION GAP: 10 (ref 5–15)
BUN: 30 mg/dL — ABNORMAL HIGH (ref 6–23)
CHLORIDE: 104 mmol/L (ref 96–112)
CO2: 25 mmol/L (ref 19–32)
CREATININE: 2.03 mg/dL — AB (ref 0.50–1.10)
Calcium: 8.8 mg/dL (ref 8.4–10.5)
GFR calc Af Amer: 27 mL/min — ABNORMAL LOW (ref >90)
GFR calc non Af Amer: 24 mL/min — ABNORMAL LOW (ref >90)
Glucose, Bld: 113 mg/dL — ABNORMAL HIGH (ref 70–99)
POTASSIUM: 4.1 mmol/L (ref 3.5–5.1)
Sodium: 139 mmol/L (ref 135–145)

## 2014-11-19 LAB — GLUCOSE, CAPILLARY
GLUCOSE-CAPILLARY: 130 mg/dL — AB (ref 70–99)
GLUCOSE-CAPILLARY: 143 mg/dL — AB (ref 70–99)

## 2014-11-19 LAB — PREPARE RBC (CROSSMATCH)

## 2014-11-19 LAB — I-STAT TROPONIN, ED: Troponin i, poc: 0.01 ng/mL (ref 0.00–0.08)

## 2014-11-19 LAB — BRAIN NATRIURETIC PEPTIDE: B NATRIURETIC PEPTIDE 5: 2140.8 pg/mL — AB (ref 0.0–100.0)

## 2014-11-19 MED ORDER — ASPIRIN 325 MG PO TABS
325.0000 mg | ORAL_TABLET | Freq: Every day | ORAL | Status: DC
  Administered 2014-11-19 – 2014-11-24 (×6): 325 mg via ORAL
  Filled 2014-11-19 (×7): qty 1

## 2014-11-19 MED ORDER — DORZOLAMIDE HCL-TIMOLOL MAL 2-0.5 % OP SOLN
1.0000 [drp] | Freq: Two times a day (BID) | OPHTHALMIC | Status: DC
  Administered 2014-11-19 – 2014-11-26 (×14): 1 [drp] via OPHTHALMIC
  Filled 2014-11-19 (×2): qty 10

## 2014-11-19 MED ORDER — INSULIN ASPART 100 UNIT/ML ~~LOC~~ SOLN
0.0000 [IU] | Freq: Three times a day (TID) | SUBCUTANEOUS | Status: DC
  Administered 2014-11-20: 3 [IU] via SUBCUTANEOUS
  Administered 2014-11-20: 2 [IU] via SUBCUTANEOUS
  Administered 2014-11-21: 3 [IU] via SUBCUTANEOUS
  Administered 2014-11-21: 2 [IU] via SUBCUTANEOUS
  Administered 2014-11-24: 3 [IU] via SUBCUTANEOUS
  Administered 2014-11-24 – 2014-11-25 (×2): 2 [IU] via SUBCUTANEOUS
  Administered 2014-11-25: 3 [IU] via SUBCUTANEOUS
  Administered 2014-11-25: 2 [IU] via SUBCUTANEOUS

## 2014-11-19 MED ORDER — SODIUM CHLORIDE 0.9 % IV SOLN: Freq: Once | INTRAVENOUS | Status: DC

## 2014-11-19 MED ORDER — FAMOTIDINE 20 MG PO TABS
20.0000 mg | ORAL_TABLET | Freq: Every day | ORAL | Status: DC
  Administered 2014-11-19 – 2014-11-25 (×7): 20 mg via ORAL
  Filled 2014-11-19 (×8): qty 1

## 2014-11-19 MED ORDER — ARIPIPRAZOLE 5 MG PO TABS
5.0000 mg | ORAL_TABLET | Freq: Every day | ORAL | Status: DC
  Administered 2014-11-20 – 2014-11-26 (×8): 5 mg via ORAL
  Filled 2014-11-19 (×7): qty 1

## 2014-11-19 MED ORDER — MELATONIN 3 MG PO TABS: 3.0000 mg | ORAL_TABLET | Freq: Every day | ORAL | Status: DC

## 2014-11-19 MED ORDER — ONDANSETRON HCL 4 MG/2ML IJ SOLN
4.0000 mg | Freq: Four times a day (QID) | INTRAMUSCULAR | Status: DC | PRN
  Administered 2014-11-22: 4 mg via INTRAVENOUS
  Filled 2014-11-19: qty 2

## 2014-11-19 MED ORDER — FUROSEMIDE 10 MG/ML IJ SOLN
80.0000 mg | Freq: Two times a day (BID) | INTRAMUSCULAR | Status: DC
  Administered 2014-11-19 – 2014-11-20 (×3): 80 mg via INTRAVENOUS
  Filled 2014-11-19 (×4): qty 8

## 2014-11-19 MED ORDER — LEVOTHYROXINE SODIUM 100 MCG PO TABS
100.0000 ug | ORAL_TABLET | Freq: Every day | ORAL | Status: DC
  Administered 2014-11-20 – 2014-11-26 (×6): 100 ug via ORAL
  Filled 2014-11-19 (×9): qty 1

## 2014-11-19 MED ORDER — ACETAMINOPHEN 325 MG PO TABS: 650.0000 mg | ORAL_TABLET | ORAL | Status: DC | PRN

## 2014-11-19 MED ORDER — SODIUM CHLORIDE 0.9 % IJ SOLN
3.0000 mL | INTRAMUSCULAR | Status: DC | PRN
  Administered 2014-11-23: 3 mL via INTRAVENOUS
  Filled 2014-11-19: qty 3

## 2014-11-19 MED ORDER — FAMOTIDINE 10 MG PO TABS
10.0000 mg | ORAL_TABLET | Freq: Every day | ORAL | Status: DC
  Filled 2014-11-19: qty 1

## 2014-11-19 MED ORDER — HYDRALAZINE HCL 50 MG PO TABS
100.0000 mg | ORAL_TABLET | Freq: Three times a day (TID) | ORAL | Status: DC
  Administered 2014-11-20 – 2014-11-26 (×19): 100 mg via ORAL
  Filled 2014-11-19 (×23): qty 2

## 2014-11-19 MED ORDER — POLYETHYL GLYCOL-PROPYL GLYCOL 0.4-0.3 % OP SOLN: 1.0000 [drp] | Freq: Two times a day (BID) | OPHTHALMIC | Status: DC

## 2014-11-19 MED ORDER — INSULIN GLARGINE 100 UNIT/ML ~~LOC~~ SOLN
20.0000 [IU] | Freq: Two times a day (BID) | SUBCUTANEOUS | Status: DC
  Administered 2014-11-19: 20 [IU] via SUBCUTANEOUS
  Filled 2014-11-19 (×3): qty 0.2

## 2014-11-19 MED ORDER — INSULIN ASPART 100 UNIT/ML ~~LOC~~ SOLN: 0.0000 [IU] | Freq: Every day | SUBCUTANEOUS | Status: DC

## 2014-11-19 MED ORDER — ATORVASTATIN CALCIUM 80 MG PO TABS
80.0000 mg | ORAL_TABLET | Freq: Every day | ORAL | Status: DC
  Administered 2014-11-19 – 2014-11-25 (×7): 80 mg via ORAL
  Filled 2014-11-19 (×8): qty 1

## 2014-11-19 MED ORDER — PANTOPRAZOLE SODIUM 40 MG PO TBEC
40.0000 mg | DELAYED_RELEASE_TABLET | Freq: Every day | ORAL | Status: DC
  Administered 2014-11-20 – 2014-11-26 (×7): 40 mg via ORAL
  Filled 2014-11-19 (×10): qty 1

## 2014-11-19 MED ORDER — SODIUM CHLORIDE 0.9 % IJ SOLN
3.0000 mL | Freq: Two times a day (BID) | INTRAMUSCULAR | Status: DC
  Administered 2014-11-19 – 2014-11-26 (×14): 3 mL via INTRAVENOUS

## 2014-11-19 MED ORDER — SERTRALINE HCL 100 MG PO TABS
200.0000 mg | ORAL_TABLET | Freq: Every day | ORAL | Status: DC
  Administered 2014-11-19 – 2014-11-24 (×6): 200 mg via ORAL
  Filled 2014-11-19 (×7): qty 2

## 2014-11-19 MED ORDER — PREDNISOLONE ACETATE 1 % OP SUSP
1.0000 [drp] | Freq: Four times a day (QID) | OPHTHALMIC | Status: DC
  Administered 2014-11-19 – 2014-11-26 (×28): 1 [drp] via OPHTHALMIC
  Filled 2014-11-19: qty 1

## 2014-11-19 MED ORDER — INSULIN GLARGINE 100 UNIT/ML SOLOSTAR PEN: 20.0000 [IU] | PEN_INJECTOR | Freq: Two times a day (BID) | SUBCUTANEOUS | Status: DC

## 2014-11-19 MED ORDER — TRAMADOL HCL 50 MG PO TABS
50.0000 mg | ORAL_TABLET | Freq: Four times a day (QID) | ORAL | Status: DC | PRN
  Administered 2014-11-19 – 2014-11-25 (×9): 50 mg via ORAL
  Filled 2014-11-19 (×9): qty 1

## 2014-11-19 MED ORDER — NITROGLYCERIN 0.4 MG SL SUBL: 0.4000 mg | SUBLINGUAL_TABLET | SUBLINGUAL | Status: DC | PRN

## 2014-11-19 MED ORDER — FUROSEMIDE 10 MG/ML IJ SOLN
80.0000 mg | Freq: Once | INTRAMUSCULAR | Status: AC
Start: 2014-11-19 — End: 2014-11-19
  Administered 2014-11-19: 80 mg via INTRAVENOUS
  Filled 2014-11-19: qty 8

## 2014-11-19 MED ORDER — ENOXAPARIN SODIUM 30 MG/0.3ML ~~LOC~~ SOLN
30.0000 mg | SUBCUTANEOUS | Status: DC
  Administered 2014-11-20 – 2014-11-25 (×6): 30 mg via SUBCUTANEOUS
  Filled 2014-11-19 (×9): qty 0.3

## 2014-11-19 MED ORDER — CLOPIDOGREL BISULFATE 75 MG PO TABS
75.0000 mg | ORAL_TABLET | Freq: Every day | ORAL | Status: DC
  Administered 2014-11-19 – 2014-11-26 (×8): 75 mg via ORAL
  Filled 2014-11-19 (×8): qty 1

## 2014-11-19 MED ORDER — AMLODIPINE BESYLATE 10 MG PO TABS
10.0000 mg | ORAL_TABLET | Freq: Every day | ORAL | Status: DC
  Administered 2014-11-19 – 2014-11-26 (×8): 10 mg via ORAL
  Filled 2014-11-19: qty 1
  Filled 2014-11-19: qty 2
  Filled 2014-11-19 (×6): qty 1

## 2014-11-19 MED ORDER — SODIUM CHLORIDE 0.9 % IV SOLN: 250.0000 mL | INTRAVENOUS | Status: DC | PRN

## 2014-11-19 MED ORDER — POLYVINYL ALCOHOL 1.4 % OP SOLN
1.0000 [drp] | Freq: Two times a day (BID) | OPHTHALMIC | Status: DC
  Administered 2014-11-19 – 2014-11-26 (×13): 1 [drp] via OPHTHALMIC
  Filled 2014-11-19: qty 15

## 2014-11-19 MED ORDER — ISOSORBIDE DINITRATE 20 MG PO TABS
20.0000 mg | ORAL_TABLET | Freq: Three times a day (TID) | ORAL | Status: DC
  Administered 2014-11-19 – 2014-11-26 (×21): 20 mg via ORAL
  Filled 2014-11-19 (×23): qty 1

## 2014-11-19 NOTE — Progress Notes (Signed)
PHARMACIST - PHYSICIAN ORDER COMMUNICATION  CONCERNING: P&T Medication Policy on Herbal Medications  DESCRIPTION:  This patient's order for:  Melatonin  has been noted.  This product(s) is classified as an "herbal" or natural product. Due to a lack of definitive safety studies or FDA approval, nonstandard manufacturing practices, plus the potential risk of unknown drug-drug interactions while on inpatient medications, the Pharmacy and Therapeutics Committee does not permit the use of "herbal" or natural products of this type within [facility].   ACTION TAKEN: The pharmacy department is unable to verify this order at this time. Please reevaluate patient's clinical condition at discharge and address if the herbal or natural product(s) should be resumed at that time.  Hilarie Fredrickson. Taylor Padilla, RPh Pager: [phone] 11/19/2014 5:22 PM

## 2014-11-19 NOTE — ED Notes (Signed)
Pt brought in via [facility]. [facility] because pt has been having increased SOB over the past 2 days and weight gain over the past 10 days. Pt has gained approximately 11 lbs. Pt has a history of CHF. Pt is A/O. EMS V/S 150/90, HR 60, RR 18, SPO2 100% on 2L, CBG 234.

## 2014-11-19 NOTE — H&P (Signed)
[facility] History and Physical                                                                                    Taylor Padilla, is a 69 y.o. female  MRN: [id number]   DOB - 31-Oct-1945  Admit Date - 11/19/2014  Outpatient Primary MD for the patient is No PCP Per Patient  With History of -  Past Medical History  Diagnosis Date  . Hypertension   . CAD (coronary artery disease)   . 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 Macular edema     hx  . Glaucoma, both eyes   . High cholesterol   . CHF (congestive heart failure)   . Chronic kidney disease (CKD), stage II (mild)     [name] 10/07/2014  . Old myocardial infarct     "I was told I've had one; don't know when" (10/08/2014)  . Pneumonia 1990's X 1  . Hypothyroidism   . Type II diabetes mellitus   . Anemia   . History of blood transfusion 2003    "related to OHS"  . Stroke     "I've been told I've had one; I don't remember having any reaction from it at all"; denies residual on 10/08/2014      Past Surgical History  Procedure Laterality Date  . Coronary artery bypass graft  01/2002    "CABG X4" at [facility]  . Cataract extraction w/ intraocular lens  implant, bilateral Bilateral     in for   Chief Complaint  Patient presents with  . Shortness of Breath     HPI 69 year old female with underlying diabetes on insulin, hypertension, CAD, diastolic heart failure with a question of restrictive cardiomyopathy from echocardiogram. 2016, chronic kidney disease stage III, legally blind, anemia not specified and hypothyroidism. Patient has been hospitalized twice in 2016 for exacerbation of diastolic heart failure, history see discharged on 3/1. Patient reports that since discharge she has developed progressive issues with shortness of breath with exertion as well as shortness of breath at rest as well as progressive lower extremity edema. She reports an 11 pound weight gain over 10 days. She does not utilize oxygen at home.  Because of these symptoms she notified one of her physicians who increased her Lasix dosage but unfortunately her symptoms had not improved. In addition she reports that she does get dizzy somewhat when she is up ambulating.  In the ER she was afebrile. Her blood pressure was 155/53 and her heart rate has been between 43 and 52. While I was in the room interviewing the patient her heart rate dropped down to 39 and was averaging around 40-42 bpm with underlying sinus bradycardia. Chest x-ray revealed stable cardio megaly and pulmonary vascular congestion without overt pulmonary edema but noted slight interval enlargement of bilateral layering pleural effusions. Her CBG was elevated at 256, her BUN was 30 and creatinine was 2.3. Her BNP was 2140, troponin 0.01, hemoglobin 7.6; in review of laboratory data that we have dating back to February her hemoglobin has been around 7.8-8.5. The medical records we have offered no explanation for the etiology to her anemia. Platelet  count is normal and MCV is normal. Her EKG revealed sinus bradycardia with a right axis deviation.  Review of Systems   In addition to the HPI above,  No Fever-chills, myalgias or other constitutional symptoms No Headache, changes with Vision or hearing, new weakness, tingling, numbness in any extremity, some mild dizziness with standing and ambulation for several days No problems swallowing food or Liquids, indigestion/reflux No Chest pain, Cough, palpitations No Abdominal pain, N/V; no melena or hematochezia, no dark tarry stools, Bowel movements are regular; patient legally blind but has not noticed any malodorous changes to her bowel movements or urine No dysuria, hematuria or flank pain No new skin rashes, lesions, masses or bruises, No new joints pains-aches No recent weight gain or loss No polyuria, polydypsia or polyphagia,  *A full 10 point Review of Systems was done, except as stated above, all other Review of Systems were  negative.  Social History History  Substance Use Topics  . Smoking status: Never Smoker   . Smokeless tobacco: Never Used  . Alcohol Use: No    Family History Family History  Problem Relation Age of Onset  . CAD Neg Hx     Prior to Admission medications   Medication Sig Start Date End Date Taking? Authorizing Provider  amLODipine (NORVASC) 10 MG tablet Take 10 mg by mouth daily. 09/27/14  Yes Historical Provider, MD  ARIPiprazole (ABILIFY) 5 MG tablet Take 5 mg by mouth daily.   Yes Historical Provider, MD  aspirin 325 MG tablet Take 325 mg by mouth daily.   Yes Historical Provider, MD  atorvastatin (LIPITOR) 80 MG tablet Take 80 mg by mouth at bedtime. 09/21/14  Yes Historical Provider, MD  carvedilol (COREG) 12.5 MG tablet Take 1 tablet (12.5 mg total) by mouth 2 (two) times daily with a meal. 10/10/14  Yes Jerald KiefStephen K Chiu, MD  clopidogrel (PLAVIX) 75 MG tablet Take 75 mg by mouth daily. 09/24/14  Yes Historical Provider, MD  dorzolamide-timolol (COSOPT) 22.3-6.8 MG/ML ophthalmic solution Place 1 drop into both eyes every 12 (twelve) hours. 10/06/14  Yes Historical Provider, MD  furosemide (LASIX) 80 MG tablet Take 1 tablet (80 mg total) by mouth daily. 11/03/14  Yes Joseph ArtJessica U Vann, DO  hydrALAZINE (APRESOLINE) 100 MG tablet Take 1 tablet (100 mg total) by mouth every 8 (eight) hours. 11/03/14  Yes Joseph ArtJessica U Vann, DO  insulin aspart (NOVOLOG) 100 UNIT/ML injection Inject 5 Units into the skin 3 (three) times daily with meals.   Yes Historical Provider, MD  isosorbide dinitrate (ISORDIL) 10 MG tablet Take 2 tablets (20 mg total) by mouth 3 (three) times daily. 10/10/14  Yes Jerald KiefStephen K Chiu, MD  lansoprazole (PREVACID) 30 MG capsule Take 30 mg by mouth daily. 09/27/14  Yes Historical Provider, MD  LANTUS SOLOSTAR 100 UNIT/ML Solostar Pen Inject 20 Units into the skin 2 (two) times daily. 08/03/14  Yes Historical Provider, MD  Melatonin 3 MG TABS Take 3 mg by mouth at bedtime.   Yes Historical  Provider, MD  nitroGLYCERIN (NITROSTAT) 0.4 MG SL tablet Place 0.4 mg under the tongue every 5 (five) minutes as needed for chest pain.   Yes Historical Provider, MD  Polyethyl Glycol-Propyl Glycol 0.4-0.3 % SOLN Apply 1 drop to eye 2 (two) times daily.   Yes Historical Provider, MD  prednisoLONE acetate (PRED FORTE) 1 % ophthalmic suspension Place 1 drop into the left eye 4 (four) times daily. 09/27/14  Yes Historical Provider, MD  ranitidine (ZANTAC) 150 MG tablet  Take 150 mg by mouth 2 (two) times daily as needed for heartburn. indigestion 09/24/14  Yes Historical Provider, MD  sertraline (ZOLOFT) 100 MG tablet Take 200 mg by mouth at bedtime. 09/26/14  Yes Historical Provider, MD  traMADol (ULTRAM) 50 MG tablet Take 1 tablet (50 mg total) by mouth every 6 (six) hours as needed for moderate pain. 11/03/14  Yes Joseph Art, DO  levothyroxine (SYNTHROID, LEVOTHROID) 100 MCG tablet Take 1 tablet (100 mcg total) by mouth daily before breakfast. Patient not taking: Reported on 11/19/2014 10/10/14   Jerald Kief, MD    Allergies  Allergen Reactions  . Epinephrine Other (See Comments)    Patient says she is not allergic to epi, but that it makes her jittery.  WILL RECEIVE IF NEEDED IN AN EMERGENCY.    Physical Exam  Vitals  Blood pressure 125/84, pulse 46, temperature 98.1 F (36.7 C), temperature source Oral, resp. rate 14, height 5\' 1"  (1.549 m), weight 166 lb (75.297 kg), SpO2 96 %.   General:  In no acute distress, appears pale and chronically ill  Psych:  Normal affect, Denies Suicidal or Homicidal ideations, Awake Alert, Oriented X 3. Speech and thought patterns are clear and appropriate, no apparent short term memory deficits  Neuro:   No focal neurological deficits, CN II through XII intact except for vision-patient documented legally blind, Strength 5/5 all 4 extremities, Sensation intact all 4 extremities.  ENT:  Ears and Eyes appear Normal, Conjunctivae clear, PER. Moist oral  mucosa without erythema or exudates.  Neck:  Supple, No lymphadenopathy appreciated  Respiratory:  Symmetrical chest wall movement, lateral crackles mid fields up with diminished breath sounds in the bases, 2 L oxygen  Cardiac:  RRR, No Murmurs, 2+ bilateral LE edema extending to just above the knees noted, no JVD, No carotid bruits, peripheral pulses palpable at 2+  Abdomen:  Positive bowel sounds, Soft, Non tender, Non distended,  No masses appreciated, no obvious hepatosplenomegaly  Skin:  No Cyanosis, Normal Skin Turgor, No Skin Rash or Bruise. Pale in appearance  Extremities: Symmetrical without obvious trauma or injury,  no effusions.  Data Review  CBC  Recent Labs Lab 11/19/14 1215  WBC 6.8  HGB 7.6*  HCT 24.7*  PLT 304  MCV 91.1  MCH 28.0  MCHC 30.8  RDW 16.5*  LYMPHSABS 0.7  MONOABS 0.7  EOSABS 0.1  BASOSABS 0.0    Chemistries   Recent Labs Lab 11/19/14 1215  NA 139  K 4.1  CL 104  CO2 25  GLUCOSE 113*  BUN 30*  CREATININE 2.03*  CALCIUM 8.8    estimated creatinine clearance is 23.9 mL/min (by C-G formula based on Cr of 2.03).  No results for input(s): TSH, T4TOTAL, T3FREE, THYROIDAB in the last 72 hours.  Invalid input(s): FREET3  Coagulation profile No results for input(s): INR, PROTIME in the last 168 hours.  No results for input(s): DDIMER in the last 72 hours.  Cardiac Enzymes No results for input(s): CKMB, TROPONINI, MYOGLOBIN in the last 168 hours.  Invalid input(s): CK  Invalid input(s): POCBNP  Urinalysis    Component Value Date/Time   COLORURINE YELLOW 10/07/2014 1555   APPEARANCEUR CLEAR 10/07/2014 1555   LABSPEC 1.008 10/07/2014 1555   PHURINE 7.0 10/07/2014 1555   GLUCOSEU NEGATIVE 10/07/2014 1555   HGBUR NEGATIVE 10/07/2014 1555   BILIRUBINUR NEGATIVE 10/07/2014 1555   KETONESUR NEGATIVE 10/07/2014 1555   PROTEINUR 30* 10/07/2014 1555   UROBILINOGEN 0.2 10/07/2014 1555  NITRITE NEGATIVE 10/07/2014 1555    LEUKOCYTESUR NEGATIVE 10/07/2014 1555    Imaging results:   Dg Chest 2 View  11/19/2014   CLINICAL DATA:  69 year old female with increasing shortness of breath and weight gain. Clinical history includes CHF.  EXAM: CHEST  2 VIEW  COMPARISON:  Prior chest x-ray 10/31/2014  FINDINGS: Stable cardiomegaly and mediastinal contours. Patient is status post median sternotomy with evidence of prior multivessel CABG. Atherosclerotic calcification present in the transverse aorta. Similar degree of pulmonary vascular congestion without overt edema. Small bilateral layering pleural effusions are present and perhaps slightly enlarged compared to prior. There is associated bibasilar atelectasis. Small volume fluid knee tracks along the minor fissure. No focal airspace consolidation or pneumothorax. No acute osseous abnormality.  IMPRESSION: 1. Stable cardiomegaly and pulmonary vascular congestion without overt pulmonary edema. 2. Slight interval enlargement of bilateral layering pleural effusions. 3. Associated mild bibasilar atelectasis.   Electronically Signed   By: Malachy Moan M.D.   On: 11/19/2014 13:16   Dg Chest 2 View  10/31/2014   CLINICAL DATA:  Shortness of breath since yesterday.  EXAM: CHEST  2 VIEW  COMPARISON:  10/08/2014  FINDINGS: Cardiomegaly with vascular congestion. Small bilateral effusions. Continued mild interstitial prominence could reflect slight interstitial edema, not significantly changed since prior study. No acute bony abnormality.  IMPRESSION: Continued slight interstitial prominence could reflect interstitial edema. No real change. Small bilateral effusions.   Electronically Signed   By: Charlett Nose M.D.   On: 10/31/2014 18:10   Ct Head Wo Contrast  10/31/2014   CLINICAL DATA:  Pt states she fell backwards, states walker flew out from under her.  EXAM: CT HEAD WITHOUT CONTRAST  CT CERVICAL SPINE WITHOUT CONTRAST  TECHNIQUE: Multidetector CT imaging of the head and cervical spine  was performed following the standard protocol without intravenous contrast. Multiplanar CT image reconstructions of the cervical spine were also generated.  COMPARISON:  None.  FINDINGS: CT HEAD FINDINGS  No acute intracranial hemorrhage. No focal mass lesion. No CT evidence of acute infarction. No midline shift or mass effect. No hydrocephalus. Basilar cisterns are patent.  There is periventricular white matter hypodensities. There is generalized cortical atrophy.  Paranasal sinuses and  mastoid air cells are clear.  CT CERVICAL SPINE FINDINGS  No prevertebral soft tissue swelling. Normal alignment of cervical vertebral bodies. No loss of vertebral body height. Normal facet articulation. Normal craniocervical junction.  There is joint space narrowing and endplate osteophytosis at C5-C6 and C6-7.  No evidence epidural or paraspinal hematoma.  IMPRESSION: 1. No intracranial trauma. 2. Atrophy and microvascular disease. 3. No cervical spine fracture. 4. Disc osteophytic disease of the lower cervical spine.  Also the CT head cervical   Electronically Signed   By: Genevive Bi M.D.   On: 10/31/2014 19:12   Ct Cervical Spine Wo Contrast  10/31/2014   CLINICAL DATA:  Pt states she fell backwards, states walker flew out from under her.  EXAM: CT HEAD WITHOUT CONTRAST  CT CERVICAL SPINE WITHOUT CONTRAST  TECHNIQUE: Multidetector CT imaging of the head and cervical spine was performed following the standard protocol without intravenous contrast. Multiplanar CT image reconstructions of the cervical spine were also generated.  COMPARISON:  None.  FINDINGS: CT HEAD FINDINGS  No acute intracranial hemorrhage. No focal mass lesion. No CT evidence of acute infarction. No midline shift or mass effect. No hydrocephalus. Basilar cisterns are patent.  There is periventricular white matter hypodensities. There is generalized cortical atrophy.  Paranasal sinuses and  mastoid air cells are clear.  CT CERVICAL SPINE FINDINGS  No  prevertebral soft tissue swelling. Normal alignment of cervical vertebral bodies. No loss of vertebral body height. Normal facet articulation. Normal craniocervical junction.  There is joint space narrowing and endplate osteophytosis at C5-C6 and C6-7.  No evidence epidural or paraspinal hematoma.  IMPRESSION: 1. No intracranial trauma. 2. Atrophy and microvascular disease. 3. No cervical spine fracture. 4. Disc osteophytic disease of the lower cervical spine.  Also the CT head cervical   Electronically Signed   By: Genevive Bi M.D.   On: 10/31/2014 19:12     EKG: Sinus bradycardia with right axis deviation essentially unchanged from previous EKGs   Assessment & Plan  Active Problems:   Acute respiratory failure with hypoxia/acute on chronic diastolic heart failure grade 2/moderate pulmonary hypertension 48 mmHg -Admit to telemetry -Consult cardiology since this is the third admission since February 2016 -Change Lasix to 80 mg IV every 12 hours and monitor urine output and daily weight -Heart failure pathway initiated -2-D echocardiogram February 2016 with EF 60-65% and restrictive physiology consistent with diastolic dysfunction -Continue afterload reduction with hydralazine and Isordil -Previous echocardiogram question possible strict etiology to her cardiomyopathy and therefore we'll check angiotensin-converting enzyme; review of previous chest x-rays show a lacy type pattern to the lung parenchyma that could be indicative of connective tissue disorder such as sarcoid    Sinus bradycardia -During previous admissions heart rate has averaged around 53 but currently heart rate dipping into the high 30s so concerning symptomatic bradycardia may be contributing to patient's heart failure symptomatology -We'll hold carvedilol until patient evaluated by cardiology -In February TSH was greater than 10 and if TSH has climbed higher this could also be contributing to bradycardia    Diabetes  mellitus type II, uncontrolled -Continue Lantus -Check hemoglobin A1c -Provide sliding scale insulin    Hypertension -Continue medications above including Norvasc    CKD (chronic kidney disease), stage II -Renal function appears stable -Follow labs with diuresis    Anemia -Appears to be chronic but may also be contributed to patient's heart failure exacerbation episodes -Check anemia panel -Transfuse at least 1 unit of packed red blood cells for now -Check fecal occult blood -Platelets are normal so doubt myelodysplastic syndrome    CAD -History of coronary artery bypass grafting [facility] -Appears to be asymptomatic -Defer potential Myoview stress testing to cardiology team    Hypothyroidism -TSH in February greater than 10 with a free T4 of 1.02 -Review of patient's medication reconciliation sheet has documentation that patient not taking her Synthroid -Repeat TSH this admission -Continue previously documented Synthroid 100 g daily    Dyslipidemia -Continue Lipitor    GERD  -Continue PPI and H2 blocker    Poor venous access -Have IR place IJ PICC line    DVT Prophylaxis: Renally adjusted Lovenox  Family Communication: No family at bedside    Code Status:  Full code  Condition: Stable   Time spent in minutes : 60   ELLIS,ALLISON L. ANP on 11/19/2014 at 2:25 PM  Between 7am to 7pm - Pager - [phone]  After 7pm go to [email or web address] - password [email or web address]  And look for the night coverage person covering me after hours  [facility]

## 2014-11-19 NOTE — Consult Note (Signed)
CARDIOLOGY CONSULT NOTE   Patient ID: Taylor Padilla MRN: [id number] DOB/AGE: 70/31/1947 69 y.o.  Admit date: 11/19/2014  Primary Physician   No PCP Per Patient Primary Cardiologist:  Dr. Eden Emms  Reason for Consultation  Diastolic CHF   HPI: Taylor Padilla is a 68 y.o. female with a history of GERD, HLD, HTN, DM, glaucoma/legal blidness, CKD, anemia, hypothyroidism, CAD s/p 4v CABG in 2003 in [place] and chronic diastolic CHF with multiple recent readmissions who presented to [facility] today with recurrent CHF.   Patient was recently hospitalized from 2/3-2/6 because of diastolic congestive heart failure exacerbation. She was new to our cardiology service and seen in consult by Dr. Eden Emms. 2D ECHO on 10/08/14 w/ EF 60-65%, no RWMAs, doppler parameters c/w restrictive physiology. Severely dilated LA, mod MR, mild RV dilation, mild RA dilation, PA pk pressure 48. She was successfully diuresed in the hospital and discharged on 80 mg Lasix daily. Her weight was 144lbs at discharge. Outpatient cardiology follow up was supposed to be arranged with nuclear stress testing in the office. However; she did not take her lasix as prescribed and was re-admitted with another CHF exacerbation from 2/27-3/01. She was discharged on  Lasix qd. Discharge weight 151lbs. However, patient said that she was only taking  alternating with  every other day. She was seen by the in-house PA at her assisted living facility last Wednesday and Lasix was increased to  qd due to increased SOB. However, she has continue to gain weight. She is up 15lbs and weighs 166 lbs today. She reports LE edema and worsening orthopnea. She denies chest pain.   She denies eating extra salt or drinking excess amounts of fluids. SHe has been eating a lot of soups at her assisted living facility lately but doesn't think there is a lot of seasoning in them. She moved to [place] in January and has not been able to set up a PCP or  cardiology appointment. Her meds are managed by the facility.  In the ER she was afebrile. Her blood pressure was 155/53 and her heart rate has been between 43 and 52. While I was in the room interviewing the patient her heart rate dropped down to 39 and was averaging around 40-42 bpm with underlying sinus bradycardia. Chest x-ray revealed stable cardio megaly and pulmonary vascular congestion without overt pulmonary edema but noted slight interval enlargement of bilateral layering pleural effusions. Her CBG was elevated at 256, her BUN was 30 and creatinine was 2.3. Her BNP was 2140, troponin 0.01, hemoglobin 7.6; in review of laboratory data that we have dating back to February her hemoglobin has been around 7.8-8.5. The medical records we have offered no explanation for the etiology to her anemia. Platelet count is normal and MCV is normal. Her EKG revealed sinus bradycardia with a right axis deviation.    Past Medical History  Diagnosis Date  . Hypertension   . CAD (coronary artery disease)   . 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 Macular edema     hx  . Glaucoma, both eyes   . High cholesterol   . CHF (congestive heart failure)   . Chronic kidney disease (CKD), stage II (mild)     [name] 10/07/2014  . Old myocardial infarct     "I was told I've had one; don't know when" (10/08/2014)  . Pneumonia 1990's X 1  . Hypothyroidism   . Type II diabetes mellitus   . Anemia   .  History of blood transfusion 2003    "related to OHS"  . Stroke     "I've been told I've had one; I don't remember having any reaction from it at all"; denies residual on 10/08/2014     Past Surgical History  Procedure Laterality Date  . Coronary artery bypass graft  01/2002    "CABG X4" at [place]  . Cataract extraction w/ intraocular lens  implant, bilateral Bilateral     Allergies  Allergen Reactions  . Epinephrine Other (See Comments)    Patient says she is not allergic to epi, but that it makes her jittery.   WILL RECEIVE IF NEEDED IN AN EMERGENCY.    I have reviewed the patient's current medications . amLODipine  10 mg Oral Daily  . ARIPiprazole  5 mg Oral Daily  . aspirin  325 mg Oral Daily  . atorvastatin  80 mg Oral QHS  . clopidogrel  75 mg Oral Daily  . dorzolamide-timolol  1 drop Both Eyes Q12H  . famotidine  10 mg Oral QHS  . furosemide  80 mg Intravenous Q12H  . hydrALAZINE  100 mg Oral 3 times per day  . insulin aspart  0-15 Units Subcutaneous TID WC  . insulin aspart  0-5 Units Subcutaneous QHS  . Insulin Glargine  20 Units Subcutaneous BID  . isosorbide dinitrate  20 mg Oral TID  . [START ON 11/20/2014] levothyroxine  100 mcg Oral QAC breakfast  . Melatonin  3 mg Oral QHS  . pantoprazole  20 mg Oral Daily  . Polyethyl Glycol-Propyl Glycol  1 drop Ophthalmic BID  . prednisoLONE acetate  1 drop Left Eye QID  . sertraline  200 mg Oral QHS   . sodium chloride     nitroGLYCERIN, traMADol  Prior to Admission medications   Medication Sig Start Date End Date Taking? Authorizing Provider  amLODipine (NORVASC) 10 MG tablet Take 10 mg by mouth daily. 09/27/14  Yes Historical Provider, MD  ARIPiprazole (ABILIFY) 5 MG tablet Take 5 mg by mouth daily.   Yes Historical Provider, MD  aspirin 325 MG tablet Take 325 mg by mouth daily.   Yes Historical Provider, MD  atorvastatin (LIPITOR) 80 MG tablet Take 80 mg by mouth at bedtime. 09/21/14  Yes Historical Provider, MD  carvedilol (COREG) 12.5 MG tablet Take 1 tablet (12.5 mg total) by mouth 2 (two) times daily with a meal. 10/10/14  Yes Jerald Kief, MD  clopidogrel (PLAVIX) 75 MG tablet Take 75 mg by mouth daily. 09/24/14  Yes Historical Provider, MD  dorzolamide-timolol (COSOPT) 22.3-6.8 MG/ML ophthalmic solution Place 1 drop into both eyes every 12 (twelve) hours. 10/06/14  Yes Historical Provider, MD  furosemide (LASIX) 80 MG tablet Take 1 tablet (80 mg total) by mouth daily. 11/03/14  Yes Joseph Art, DO  hydrALAZINE (APRESOLINE) 100 MG  tablet Take 1 tablet (100 mg total) by mouth every 8 (eight) hours. 11/03/14  Yes Joseph Art, DO  insulin aspart (NOVOLOG) 100 UNIT/ML injection Inject 5 Units into the skin 3 (three) times daily with meals.   Yes Historical Provider, MD  isosorbide dinitrate (ISORDIL) 10 MG tablet Take 2 tablets (20 mg total) by mouth 3 (three) times daily. 10/10/14  Yes Jerald Kief, MD  lansoprazole (PREVACID) 30 MG capsule Take 30 mg by mouth daily. 09/27/14  Yes Historical Provider, MD  LANTUS SOLOSTAR 100 UNIT/ML Solostar Pen Inject 20 Units into the skin 2 (two) times daily. 08/03/14  Yes Historical Provider,  MD  Melatonin 3 MG TABS Take 3 mg by mouth at bedtime.   Yes Historical Provider, MD  nitroGLYCERIN (NITROSTAT) 0.4 MG SL tablet Place 0.4 mg under the tongue every 5 (five) minutes as needed for chest pain.   Yes Historical Provider, MD  Polyethyl Glycol-Propyl Glycol 0.4-0.3 % SOLN Apply 1 drop to eye 2 (two) times daily.   Yes Historical Provider, MD  prednisoLONE acetate (PRED FORTE) 1 % ophthalmic suspension Place 1 drop into the left eye 4 (four) times daily. 09/27/14  Yes Historical Provider, MD  ranitidine (ZANTAC) 150 MG tablet Take 150 mg by mouth 2 (two) times daily as needed for heartburn. indigestion 09/24/14  Yes Historical Provider, MD  sertraline (ZOLOFT) 100 MG tablet Take 200 mg by mouth at bedtime. 09/26/14  Yes Historical Provider, MD  traMADol (ULTRAM) 50 MG tablet Take 1 tablet (50 mg total) by mouth every 6 (six) hours as needed for moderate pain. 11/03/14  Yes Joseph Art, DO  levothyroxine (SYNTHROID, LEVOTHROID) 100 MCG tablet Take 1 tablet (100 mcg total) by mouth daily before breakfast. Patient not taking: Reported on 11/19/2014 10/10/14   Jerald Kief, MD     History   Social History  . Marital Status: Widowed    Spouse Name: N/A  . Number of Children: N/A  . Years of Education: N/A   Occupational History  . Not on file.   Social History Main Topics  . Smoking  status: Never Smoker   . Smokeless tobacco: Never Used  . Alcohol Use: No  . Drug Use: No  . Sexual Activity: No   Other Topics Concern  . Not on file   Social History Narrative   Live in [facility], moved from [place] in Dec 2015    Family Status  Relation Status Death Age  . Mother Deceased     October 18, 2014, died of PNA at age 53  . Father Deceased     died of emphesema   Family History  Problem Relation Age of Onset  . CAD Neg Hx      ROS:  Full 14 point review of systems complete and found to be negative unless listed above.  Physical Exam: Blood pressure 125/84, pulse 46, temperature 98.1 F (36.7 C), temperature source Oral, resp. rate 14, height 5\' 1"  (1.549 m), weight 166 lb (75.297 kg), SpO2 96 %.  General: Well developed, well nourished, female in no acute distress. Cannot see well Head: Eyes PERRLA, No xanthomas.   Normocephalic and atraumatic, oropharynx without edema or exudate.   Lungs: crackles at bases  Heart: HRRR S1 S2, no rub/gallop, Heart irregular rate and rhythm with S1, S2  murmur. pulses are 2+ extrem.   Neck: No carotid bruits. No lymphadenopathy. + JVD. Abdomen: Bowel sounds present, abdomen soft and non-tender without masses or hernias noted. Msk:  No spine or cva tenderness. No weakness, no joint deformities or effusions. Extremities: No clubbing or cyanosis. 2+ ptting edema bilaterally Neuro: Alert and oriented X 3. No focal deficits noted. Psych:  Good affect, responds appropriately Skin: No rashes or lesions noted.  Labs:   Lab Results  Component Value Date   WBC 6.8 11/19/2014   HGB 7.6* 11/19/2014   HCT 24.7* 11/19/2014   MCV 91.1 11/19/2014   PLT 304 11/19/2014   No results for input(s): INR in the last 72 hours.  Recent Labs Lab 11/19/14 1215  NA 139  K 4.1  CL 104  CO2  25  BUN 30*  CREATININE 2.03*  CALCIUM 8.8  GLUCOSE 113*   MAGNESIUM  Date Value Ref Range Status  11/01/2014 2.3 1.5 - 2.5  mg/dL Final   No results for input(s): CKTOTAL, CKMB, TROPONINI in the last 72 hours.  Recent Labs  11/19/14 1351  TROPIPOC 0.01    TSH  Date/Time Value Ref Range Status  10/08/2014 01:16 AM 10.329* 0.350 - 4.500 uIU/mL Final     2D Echo: Study Date: 10/08/2014 LV EF: 60% -   65% Study Conclusions - Left ventricle: The cavity size was normal. Wall thickness was   normal. Systolic function was normal. The estimated ejection   fraction was in the range of 60% to 65%. Wall motion was normal;   there were no regional wall motion abnormalities. Doppler   parameters are consistent with restrictive physiology, indicative   of decreased left ventricular diastolic compliance and/or   increased left atrial pressure. - Ventricular septum: Septal motion showed paradox. These changes   are consistent with a post-thoracotomy state. - Mitral valve: Calcified annulus. Mildly thickened leaflets .   There was moderate regurgitation directed centrally. - Left atrium: The atrium was severely dilated. - Right ventricle: The cavity size was mildly dilated. - Right atrium: The atrium was mildly dilated. - Pulmonary arteries: Systolic pressure was mildly to moderately   increased. PA peak pressure: 48 mm Hg (S). 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   ECG:  HR 41 Sinus bradycardia Atrial premature complex Right axis deviation   Radiology:  Dg Chest 2 View  11/19/2014   CLINICAL DATA:  69 year old female with increasing shortness of breath and weight gain. Clinical history includes CHF.  EXAM: CHEST  2 VIEW  COMPARISON:  Prior chest x-ray 10/31/2014  FINDINGS: Stable cardiomegaly and mediastinal contours. Patient is status post median sternotomy with evidence of prior multivessel CABG. Atherosclerotic calcification present in the transverse aorta. Similar degree of pulmonary vascular congestion without overt edema. Small bilateral layering pleural effusions are present and perhaps  slightly enlarged compared to prior. There is associated bibasilar atelectasis. Small volume fluid knee tracks along the minor fissure. No focal airspace consolidation or pneumothorax. No acute osseous abnormality.  IMPRESSION: 1. Stable cardiomegaly and pulmonary vascular congestion without overt pulmonary edema. 2. Slight interval enlargement of bilateral layering pleural effusions. 3. Associated mild bibasilar atelectasis.   Electronically Signed   By: Malachy MoanHeath  McCullough M.D.   On: 11/19/2014 13:16    ASSESSMENT AND PLAN:    Active Problems:   Diabetes mellitus type II, uncontrolled   Hypertension   CAD (coronary artery disease)   CKD (chronic kidney disease), stage II   Hypothyroidism   Dyslipidemia   GERD (gastroesophageal reflux disease)   Acute respiratory failure with hypoxia   Sinus bradycardia   Anemia   Acute on chronic diastolic heart failure   Acute diastolic heart failure, NYHA class 1    Taylor Padilla is a 69 y.o. female with a history of GERD, HLD, HTN, DM, glaucoma/legal blidness, CKD, anemia, hypothyroidism, CAD s/[place] 4v CAB[place] and chronic diastolic CHF with multiple recent readmissions who [facility] today with recurrent CHF.  Acute on chronic diastolic CHF -- Patient with s/s volume overload. BNP 2140, CXR with pulmonary vascular congestion without overt pulmonary edema and slight interval enlargement of bilateral layering pleural effusions. Patient up 15 lbs since discharge on 11/03/14.  -- 2D ECHO with EF 60-65%, no RWMAs, doppler parameters c/w restrictive physiology. Severely dilated LA,  mod MR, mild RV dilation, mild RA dilation, PA pk pressure 48.  -- Previous echocardiogram question possible strict etiology to her cardiomyopathy. IM checking her angiotensin-converting enzyme; review of previous chest x-rays show a lacy type pattern to the lung parenchyma that could be indicative of connective tissue disorder such as sarcoid -- Placed on IV  Lasix 80mg  BID by IM. Strict I/Os -- Continue nitrates/hydralazine for afterload reduction. No ACE/ARB due to CKD. BB discontinued today due to bradycardia  Sinus bradycardia -- During previous admissions heart rate has averaged around 53 but currently heart rate dipping into the high 30s so concerning symptomatic bradycardia may be contributing to patient's heart failure symptomatology -- IM has placed her BB on hold. Agree with this.  -- In February TSH was greater than 10 and if TSH has climbed higher this could also be contributing to bradycardia  Hypertension -- Continue amlodpine 10, hydralazine 100 Q8, isodril 10 tid. Coreg 12.5 mg bid being held due to bradycardia.   CKD- Creat mildly elevated at 2.03. 1.8 on previous admissions. -- Recent renal artery dopplers with no evidence of renal artery stenosis. Intrarenal resistive indices above 0.7 (above normal limits) on the left. Incidental finding bilateral cortical renal cysts.  Anemia: H/H 7.6/24.7 -- Appears to be chronic but may also be contributed to patient's heart failure exacerbation episodes -- IM checking anemia panel, FOBT and transfusing at least 1 U PRBC  CAD  -- Troponin neg x1 -- s/p CABG in 2003. Cardiology recommended proceeding with outpatient nuclear stress test but patient never followed up on this. Consider doing while inpatient. -- Continue ASA, plavix  Hypothyroidism -- TSH in February greater than 10 with a free T4 of 1.02 -- Review of patient's medication reconciliation sheet has documentation that patient not taking her Synthroid -- Repeat TSH this admission.  -- Management per IM  Dyslipidemia -- Continue Lipitor  GERD  -- Continue PPI and H2 blocker  Poor venous access -- IR will place IJ PICC line    Signed: Janetta Hora, PA-C 11/19/2014 2:50 PM  Pager [phone]  Co-Sign MD  Patient seen and examined and history reviewed. Agree with above findings and plan. Very pleasant 69 yo  WF now readmitted for the 3rd time in 6 weeks with CHF exacerbation. She lives in an assisted living facility. She is unsure whether she gets a low sodium diet. She has increased edema and SOB associated with a 15 lbs weight gain. Lasix increased some as outpatient but ineffective. Prior Echo showed normal systolic function but with restrictive diastolic function and moderate pulmonary HTN. Need to consider infiltrative cardiac disease such as amyloid or sarcoid(less likely). Agree with current management with IV lasix. Will have to hold Coreg due to marked brady with HR dropping into 30s. Will need to watch renal function closely. Will ask advanced heart failure team to evaluate in am.  Taylor Padilla, MDFACC 11/19/2014 5:20 PM

## 2014-11-19 NOTE — ED Provider Notes (Signed)
CSN: [id number]     Arrival date & time 11/19/14  1034 History   First MD Initiated Contact with Patient 11/19/14 1101     Chief Complaint  Patient presents with  . Shortness of Breath     (Consider location/radiation/quality/duration/timing/severity/associated sxs/prior Treatment) The history is provided by the patient. No language interpreter was used.   [name] is a 69 y.o white female with a history of CHF, bypass, and DM who presents with gradual onset of 11 pound weight gain and shortness of breath in the past 10 days. Nothing makes the symptoms better. Exertion makes it worse.  She recently had her lasix increased to  everyday.  She denies chest pain, abdominal pain, vomiting, nausea. She was recently discharged on 3/1 for CHF exacerbation.  Past Medical History  Diagnosis Date  . Hypertension   . CAD (coronary artery disease)   . 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 Macular edema     hx  . Glaucoma, both eyes   . High cholesterol   . CHF (congestive heart failure)   . Chronic kidney disease (CKD), stage II (mild)     Taylor Padilla 10/07/2014  . Old myocardial infarct     "I was told I've had one; don't know when" (10/08/2014)  . Pneumonia 1990's X 1  . Hypothyroidism   . Type II diabetes mellitus   . Anemia   . History of blood transfusion 2003    "related to OHS"  . Stroke     "I've been told I've had one; I don't remember having any reaction from it at all"; denies residual on 10/08/2014   Past Surgical History  Procedure Laterality Date  . Coronary artery bypass graft  01/2002    "CABG X4" at [facility]  . Cataract extraction w/ intraocular lens  implant, bilateral Bilateral    Family History  Problem Relation Age of Onset  . CAD Neg Hx    History  Substance Use Topics  . Smoking status: Never Smoker   . Smokeless tobacco: Never Used  . Alcohol Use: No   OB History    No data available     Review of Systems  Constitutional: Negative for fever.   Cardiovascular: Negative for chest pain.  Gastrointestinal: Negative for blood in stool.  Genitourinary: Negative for difficulty urinating.  Neurological: Negative for dizziness and syncope.  All other systems reviewed and are negative.     Allergies  Epinephrine  Home Medications   Prior to Admission medications   Medication Sig Start Date End Date Taking? Authorizing Provider  amLODipine (NORVASC) 10 MG tablet Take 10 mg by mouth daily. 09/27/14  Yes Historical Provider, MD  ARIPiprazole (ABILIFY) 5 MG tablet Take 5 mg by mouth daily.   Yes Historical Provider, MD  aspirin 325 MG tablet Take 325 mg by mouth daily.   Yes Historical Provider, MD  atorvastatin (LIPITOR) 80 MG tablet Take 80 mg by mouth at bedtime. 09/21/14  Yes Historical Provider, MD  carvedilol (COREG) 12.5 MG tablet Take 1 tablet (12.5 mg total) by mouth 2 (two) times daily with a meal. 10/10/14  Yes Jerald Kief, MD  clopidogrel (PLAVIX) 75 MG tablet Take 75 mg by mouth daily. 09/24/14  Yes Historical Provider, MD  dorzolamide-timolol (COSOPT) 22.3-6.8 MG/ML ophthalmic solution Place 1 drop into both eyes every 12 (twelve) hours. 10/06/14  Yes Historical Provider, MD  furosemide (LASIX) 80 MG tablet Take 1 tablet (80 mg total) by mouth daily. 11/03/14  Yes Joseph ArtJessica U Vann, DO  hydrALAZINE (APRESOLINE) 100 MG tablet Take 1 tablet (100 mg total) by mouth every 8 (eight) hours. 11/03/14  Yes Joseph ArtJessica U Vann, DO  insulin aspart (NOVOLOG) 100 UNIT/ML injection Inject 5 Units into the skin 3 (three) times daily with meals.   Yes Historical Provider, MD  isosorbide dinitrate (ISORDIL) 10 MG tablet Take 2 tablets (20 mg total) by mouth 3 (three) times daily. 10/10/14  Yes Jerald KiefStephen K Chiu, MD  lansoprazole (PREVACID) 30 MG capsule Take 30 mg by mouth daily. 09/27/14  Yes Historical Provider, MD  LANTUS SOLOSTAR 100 UNIT/ML Solostar Pen Inject 20 Units into the skin 2 (two) times daily. 08/03/14  Yes Historical Provider, MD  Melatonin 3 MG  TABS Take 3 mg by mouth at bedtime.   Yes Historical Provider, MD  nitroGLYCERIN (NITROSTAT) 0.4 MG SL tablet Place 0.4 mg under the tongue every 5 (five) minutes as needed for chest pain.   Yes Historical Provider, MD  Polyethyl Glycol-Propyl Glycol 0.4-0.3 % SOLN Apply 1 drop to eye 2 (two) times daily.   Yes Historical Provider, MD  prednisoLONE acetate (PRED FORTE) 1 % ophthalmic suspension Place 1 drop into the left eye 4 (four) times daily. 09/27/14  Yes Historical Provider, MD  ranitidine (ZANTAC) 150 MG tablet Take 150 mg by mouth 2 (two) times daily as needed for heartburn. indigestion 09/24/14  Yes Historical Provider, MD  sertraline (ZOLOFT) 100 MG tablet Take 200 mg by mouth at bedtime. 09/26/14  Yes Historical Provider, MD  traMADol (ULTRAM) 50 MG tablet Take 1 tablet (50 mg total) by mouth every 6 (six) hours as needed for moderate pain. 11/03/14  Yes Joseph ArtJessica U Vann, DO  levothyroxine (SYNTHROID, LEVOTHROID) 100 MCG tablet Take 1 tablet (100 mcg total) by mouth daily before breakfast. Patient not taking: Reported on 11/19/2014 10/10/14   Jerald KiefStephen K Chiu, MD   BP 125/84 mmHg  Pulse 46  Temp(Src) 98.1 F (36.7 C) (Oral)  Resp 14  Ht 5\' 1"  (1.549 m)  Wt 166 lb (75.297 kg)  BMI 31.38 kg/m2  SpO2 96% Physical Exam  Constitutional: She is oriented to person, place, and time. She appears well-developed and well-nourished.  HENT:  Head: Normocephalic and atraumatic.  Eyes: Conjunctivae are normal.  Neck: Normal range of motion. Neck supple.  Cardiovascular: Regular rhythm and normal heart sounds.  Bradycardia present.   Pulmonary/Chest: Effort normal and breath sounds normal.  Patient is between 97-99% oxygen on 2L at bedside.   Abdominal: Soft. There is no tenderness.  Musculoskeletal: Normal range of motion.  1+ Pitting edema in the lower extremities bilaterally.  Neurological: She is alert and oriented to person, place, and time.  Skin: Skin is warm and dry.  Nursing note and vitals  reviewed.   ED Course  Procedures (including critical care time) Labs Review Labs Reviewed  BRAIN NATRIURETIC PEPTIDE - Abnormal; Notable for the following:    B Natriuretic Peptide 2140.8 (*)    All other components within normal limits  CBC WITH DIFFERENTIAL/PLATELET - Abnormal; Notable for the following:    RBC 2.71 (*)    Hemoglobin 7.6 (*)    HCT 24.7 (*)    RDW 16.5 (*)    Lymphocytes Relative 10 (*)    All other components within normal limits  BASIC METABOLIC PANEL - Abnormal; Notable for the following:    Glucose, Bld 113 (*)    BUN 30 (*)    Creatinine, Ser 2.03 (*)  GFR calc non Af Amer 24 (*)    GFR calc Af Amer 27 (*)    All other components within normal limits  ANGIOTENSIN CONVERTING ENZYME  VITAMIN B12  FOLATE  IRON AND TIBC  FERRITIN  RETICULOCYTES  HEMOGLOBIN A1C  I-STAT TROPOININ, ED  TYPE AND SCREEN  PREPARE RBC (CROSSMATCH)    Imaging Review Dg Chest 2 View  11/19/2014   CLINICAL DATA:  69 year old female with increasing shortness of breath and weight gain. Clinical history includes CHF.  EXAM: CHEST  2 VIEW  COMPARISON:  Prior chest x-ray 10/31/2014  FINDINGS: Stable cardiomegaly and mediastinal contours. Patient is status post median sternotomy with evidence of prior multivessel CABG. Atherosclerotic calcification present in the transverse aorta. Similar degree of pulmonary vascular congestion without overt edema. Small bilateral layering pleural effusions are present and perhaps slightly enlarged compared to prior. There is associated bibasilar atelectasis. Small volume fluid knee tracks along the minor fissure. No focal airspace consolidation or pneumothorax. No acute osseous abnormality.  IMPRESSION: 1. Stable cardiomegaly and pulmonary vascular congestion without overt pulmonary edema. 2. Slight interval enlargement of bilateral layering pleural effusions. 3. Associated mild bibasilar atelectasis.   Electronically Signed   By: Malachy Moan M.D.    On: 11/19/2014 13:16     EKG Interpretation   Date/Time:  Thursday November 19 2014 10:48:43 EDT Ventricular Rate:  42 PR Interval:  193 QRS Duration: 110 QT Interval:  549 QTC Calculation: 459 R Axis:   -153 Text Interpretation:  Sinus bradycardia Atrial premature complex Right  axis deviation Abnormal T, consider ischemia, lateral leads Confirmed by  ALLEN  MD, ANTHONY ([id number]) on 11/19/2014 11:26:25 AM      MDM   Final diagnoses:  Poor venous access   Patient is here for recent 10 pound weight gain and shortness of breath. She denies any chest pain, abdominal pain, or nausea and vomiting.  She was discharged on 3/1 for CHF exacerbation.   Her chest xray shows bilateral pulmonary effusions.  BNP is 2140. Her Hgb is 7.6. She has a 14 pound weight gain since discharge on 3/1. Troponin is negative.    Dr. Freida Busman spoke to hospitalist, Dr. Thedore Mins, regarding admission to telemetry.     Catha Gosselin, PA-C 11/19/14 1440

## 2014-11-19 NOTE — ED Notes (Addendum)
Pt refused to go to IR for PICC placement. MD  Thedore MinsSingh notified.

## 2014-11-19 NOTE — Progress Notes (Signed)
Pt has unit of blood ordered. Lab called stating that pt had a positive antibody in cross and screen. Will take longer to prepare blood for patient.

## 2014-11-19 NOTE — Progress Notes (Signed)
Pt has a positive history of MRSA from last admission. Placed on contact precautions.

## 2014-11-19 NOTE — ED Provider Notes (Signed)
Medical screening examination/treatment/procedure(s) were conducted as a shared visit with non-physician practitioner(s) and myself.  I personally evaluated the patient during the encounter.   EKG Interpretation   Date/Time:  Thursday November 19 2014 10:48:43 EDT Ventricular Rate:  42 PR Interval:  193 QRS Duration: 110 QT Interval:  549 QTC Calculation: 459 R Axis:   -153 Text Interpretation:  Sinus bradycardia Atrial premature complex Right  axis deviation Abnormal T, consider ischemia, lateral leads Confirmed by  Charmian Forbis  MD, Tobey Lippard ([id number]) on 11/19/2014 11:26:25 AM     Patient here complaining of shortness of breath with orthopnea. History of CHF and symptoms are similar. Chest x-ray does show increased pleural effusions. Had a recent hospitalization 16 days ago for similar symptoms. Will give Lasix here and admitted to the medicine service  Lorre NickAnthony Uriah Philipson, MD 11/19/14 [phone]

## 2014-11-20 ENCOUNTER — Inpatient Hospital Stay (HOSPITAL_COMMUNITY): Payer: Medicare PPO

## 2014-11-20 DIAGNOSIS — I5031 Acute diastolic (congestive) heart failure: Secondary | ICD-10-CM

## 2014-11-20 DIAGNOSIS — R001 Bradycardia, unspecified: Secondary | ICD-10-CM

## 2014-11-20 LAB — HEMOGLOBIN AND HEMATOCRIT, BLOOD
HCT: 30.5 % — ABNORMAL LOW (ref 36.0–46.0)
HEMOGLOBIN: 9.6 g/dL — AB (ref 12.0–15.0)

## 2014-11-20 LAB — IRON AND TIBC
Iron: 23 ug/dL — ABNORMAL LOW (ref 42–145)
SATURATION RATIOS: 7 % — AB (ref 20–55)
TIBC: 324 ug/dL (ref 250–470)
UIBC: 301 ug/dL (ref 125–400)

## 2014-11-20 LAB — BASIC METABOLIC PANEL
ANION GAP: 7 (ref 5–15)
BUN: 30 mg/dL — ABNORMAL HIGH (ref 6–23)
CALCIUM: 9 mg/dL (ref 8.4–10.5)
CO2: 27 mmol/L (ref 19–32)
Chloride: 106 mmol/L (ref 96–112)
Creatinine, Ser: 2.02 mg/dL — ABNORMAL HIGH (ref 0.50–1.10)
GFR calc Af Amer: 28 mL/min — ABNORMAL LOW (ref >90)
GFR, EST NON AFRICAN AMERICAN: 24 mL/min — AB (ref >90)
Glucose, Bld: 58 mg/dL — ABNORMAL LOW (ref 70–99)
POTASSIUM: 3.5 mmol/L (ref 3.5–5.1)
Sodium: 140 mmol/L (ref 135–145)

## 2014-11-20 LAB — GLUCOSE, CAPILLARY
GLUCOSE-CAPILLARY: 52 mg/dL — AB (ref 70–99)
GLUCOSE-CAPILLARY: 68 mg/dL — AB (ref 70–99)
Glucose-Capillary: 50 mg/dL — ABNORMAL LOW (ref 70–99)
Glucose-Capillary: 84 mg/dL (ref 70–99)
Glucose-Capillary: 175 mg/dL — ABNORMAL HIGH (ref 70–99)
Glucose-Capillary: 136 mg/dL — ABNORMAL HIGH (ref 70–99)
Glucose-Capillary: 88 mg/dL (ref 70–99)

## 2014-11-20 LAB — VITAMIN B12: Vitamin B-12: 426 pg/mL (ref 211–911)

## 2014-11-20 LAB — FERRITIN: Ferritin: 26 ng/mL (ref 10–291)

## 2014-11-20 LAB — FOLATE: Folate: >20 ng/mL

## 2014-11-20 MED ORDER — FERROUS SULFATE 325 (65 FE) MG PO TABS
325.0000 mg | ORAL_TABLET | Freq: Two times a day (BID) | ORAL | Status: DC
  Administered 2014-11-20 – 2014-11-25 (×10): 325 mg via ORAL
  Filled 2014-11-20 (×12): qty 1

## 2014-11-20 MED ORDER — DOXAZOSIN MESYLATE 1 MG PO TABS
1.0000 mg | ORAL_TABLET | Freq: Every day | ORAL | Status: DC
  Administered 2014-11-20: 1 mg via ORAL
  Filled 2014-11-20 (×2): qty 1

## 2014-11-20 MED ORDER — FLUTICASONE PROPIONATE 50 MCG/ACT NA SUSP
1.0000 | Freq: Every day | NASAL | Status: DC
  Administered 2014-11-20 – 2014-11-26 (×7): 1 via NASAL
  Filled 2014-11-20: qty 16

## 2014-11-20 MED ORDER — POTASSIUM CHLORIDE CRYS ER 20 MEQ PO TBCR
20.0000 meq | EXTENDED_RELEASE_TABLET | Freq: Once | ORAL | Status: AC
  Administered 2014-11-20: 20 meq via ORAL
  Filled 2014-11-20: qty 1

## 2014-11-20 MED ORDER — FUROSEMIDE 10 MG/ML IJ SOLN
80.0000 mg | Freq: Two times a day (BID) | INTRAMUSCULAR | Status: DC
  Administered 2014-11-20 – 2014-11-22 (×4): 80 mg via INTRAVENOUS
  Filled 2014-11-20 (×4): qty 8

## 2014-11-20 NOTE — Progress Notes (Signed)
Hypoglycemic Event  CBG: 50  Treatment: 4 ounces orange juice  Symptoms: asymptomatic  Follow-up CBG: Time: 0620 CBG Result: 52  Treatment: 4 ounces orange juice and 1 graham cracker  Follow-up CBG: Time: 0645  CBG Result 84      Taylor Padilla  Remember to initiate Hypoglycemia Order Set & complete

## 2014-11-20 NOTE — Progress Notes (Signed)
Inpatient Diabetes Program Recommendations  AACE/ADA: New Consensus Statement on Inpatient Glycemic Control (2013)  Target Ranges:  Prepandial:   less than 140 mg/dL      Peak postprandial:   less than 180 mg/dL (1-2 hours)      Critically ill patients:  140 - 180 mg/dL    Home regimen is Lantus 20 units bid and novolog meal coverage 5 units tidwc  Pt may have taken her am dose of lantus 20 units prior to arrival. Then give 20 units last HS-was hypoglycemic this am. May be a bit safer to decrease the lantus total daily dose of 40 units to either 10 unts bid or at least 10 units daily rather than discontinuation altogether.  Inpatient Diabetes Program Recommendations Insulin - Basal: Noted low fasting this am-rather than discontinuation of entire lantus dose of 20 units, please consider just decreasing by half to 10 units at HS  Thank you Lenor CoffinAnn Tiawanna Luchsinger, RN, MSN, CDE  Diabetes Inpatient Program Office: [phone] Pager: [phone] 8:00 am to 5:00 pm

## 2014-11-20 NOTE — Progress Notes (Signed)
PT Cancellation Note  Patient Details Name: Valentino HueDorcas Gaffey MRN: [id number] DOB: 03-22-1946   Cancelled Treatment:    Reason Eval/Treat Not Completed: Patient declined, no reason specified Pt reports just getting up with OT and refuses PT evaluation at this time. "I have been getting up i just want to get some rest." Will follow up next available time.   Alvie HeidelbergFolan, Jackilyn Umphlett A 11/20/2014, 3:48 PM  Alvie HeidelbergShauna Folan, PT, DPT [phone]

## 2014-11-20 NOTE — Evaluation (Signed)
  Occupational Therapy Evaluation and Discharge Patient Details Name: Taylor HueDorcas Leonhard MRN: [id number] DOB: [birth year] Today's Date: 11/20/2014    History of Present Illness Pt admitted with shortness of breath. PMHx:DM, HTN, legally blind, CKD, CAD with CABG   Clinical Impression   This [age] female admitted with SOB on exertion and at rest; as well as LE edema presents to acute OT at a Mod I level. No further OT needs, we will sign off.    Follow Up Recommendations  No OT follow up    Equipment Recommendations  None recommended by OT       Precautions / Restrictions Precautions Precaution Comments: Pt is legally blind Restrictions Weight Bearing Restrictions: No      Mobility Bed Mobility Overal bed mobility: Modified Independent                Transfers Overall transfer level: Modified independent Equipment used: Rolling walker (2 wheeled)             General transfer comment: only needed verbal directional cues as to which way to turn with RW due to legally blind. Pt ambulated 100 feet with RW.         ADL Overall ADL's : Modified independent                                             Vision Additional Comments: Legally blind          Pertinent Vitals/Pain Pain Assessment: No/denies pain     Hand Dominance Right   Extremity/Trunk Assessment Upper Extremity Assessment Upper Extremity Assessment: Overall WFL for tasks assessed           Communication Communication Communication: No difficulties   Cognition Arousal/Alertness: Awake/alert Behavior During Therapy: WFL for tasks assessed/performed Overall Cognitive Status: Within Functional Limits for tasks assessed                                Home Living Family/patient expects to be discharged to:: Assisted living                             Home Equipment: Walker - 2 wheels;Walker - 4 wheels   Additional Comments: They do her meds and she  goes to the dining room for meals, otherwise she does all her basic self care      Prior Functioning/Environment Level of Independence: Independent with assistive device(s)             OT Diagnosis: Generalized weakness   OT Problem List:     OT Treatment/Interventions:      OT Goals(Current goals can be found in the care plan section) Acute Rehab OT Goals Patient Stated Goal: home soon  OT Frequency:                End of Session Equipment Utilized During Treatment:  (RW)  Activity Tolerance: Patient tolerated treatment well Patient left: in bed;with call bell/phone within reach   Time: 1501-1529 OT Time Calculation (min): 28 min Charges:  OT General Charges $OT Visit: 1 Procedure OT Evaluation $Initial OT Evaluation Tier I: 1 Procedure OT Treatments $Self Care/Home Management : 8-22 mins  Evette GeorgesLeonard, Leilyn Frayre Eva [phone] 11/20/2014, 3:35 PM

## 2014-11-20 NOTE — Progress Notes (Signed)
Nutrition Education Note   RD consulted for nutrition education regarding CHF. RD provided Nutrition Education Handout ([employer]). Reviewed patients dietary recall, patient was able to point out the things in the diet that are high in sodium and she is willing to eliminate or reduce their intake. Pt expressed concerns that this hospitalization was a wake-up call and she is willing to do whatever it takes to avoid further issues with fluid retention. Has never liked much salt in her food and does not use salt on the table, despite food tasting "bland" in the facility she is living in. Encouraged intake of minimally processed foods, especially fruits and vegetables, keeping in mind that patient is also diabetic.  Current dietary intake in the hospital is poor, but pt is frustrated by her recent blindness (about a year) and does not like eating when she is not sure what it is. Pt is overweight, based on current BMI, reports having a ok appetite.  Currently living in assisted nursing facility, which makes it difficult to chose the right foods as she is restricted to what is provided to her. Doctor mentioned reducing fluids. Dietetic intern discussed ways to prevent thirst. Very little family support. RD contact information provided, no further nutrition interventions warranted at this time. Please re-consult dietitian if additional nutrition issues arise.   Labs reviewed.   Aarti Mankowski A. Partridge Housemith Dietetic Intern 11/20/2014 2:56 PM

## 2014-11-20 NOTE — Progress Notes (Signed)
Advanced Heart Failure Rounding Note   Subjective:    Marshal Schrecengost is a 69 y.o. female with a history of GERD, HLD, HTN, DM, glaucoma/legal blidness, CKD, anemia, hypothyroidism, CAD s/p 4v CABG in 2003 in [place] and chronic diastolic CHF with multiple recent readmissions who presented to [facility] today with recurrent CHF.   Patient was recently hospitalized from 2/3-2/6 because of HF exacerbation. She was new to our cardiology service and seen in consult by Dr. Eden Emms. 2D ECHO on 10/08/14 w/ EF 60-65%, no RWMAs, doppler parameters c/w restrictive physiology. PA pk pressure 48. She was successfully diuresed in the hospital and discharged on 80 mg Lasix daily. Her weight was 144lbs at discharge. Outpatient cardiology follow up was supposed to be arranged with nuclear stress testing in the office. However; she did not take her lasix as prescribed and was re-admitted with another CHF exacerbation from 2/27-3/01. She was discharged on  Lasix qd. Discharge weight 151lbs. However, patient said that she was only taking  alternating with  every other day. She was seen by the in-house PA at her assisted living facility last Wednesday and Lasix was increased to  qd due to increased SOB. However, she has continue to gain weight. She was readmitted 3/17 up 15lbs with weight 166 lbs. The advanced HF team was asked to assume care.   Diuresed 4 pounds overnight. Feeling better. Weight now 157. Breathing much better   Objective:   Weight Range:  Vital Signs:   Temp:  [97.8 F (36.6 C)-98.4 F (36.9 C)] 98.2 F (36.8 C) (03/18 1258) Pulse Rate:  [45-88] 51 (03/18 1258) Resp:  [16-18] 16 (03/18 1258) BP: (162-200)/(46-60) 191/53 mmHg (03/18 1258) SpO2:  [91 %-95 %] 94 % (03/18 1258) Weight:  [71.396 kg (157 lb 6.4 oz)-73.256 kg (161 lb 8 oz)] 71.396 kg (157 lb 6.4 oz) (03/18 0525) Last BM Date: 11/19/14  Weight change: Filed Weights   11/19/14 1056 11/19/14 1646 11/20/14 0525  Weight:  75.297 kg (166 lb) 73.256 kg (161 lb 8 oz) 71.396 kg (157 lb 6.4 oz)    Intake/Output:   Intake/Output Summary (Last 24 hours) at 11/20/14 1537 Last data filed at 11/20/14 1417  Gross per 24 hour  Intake   1135 ml  Output   3976 ml  Net  -2841 ml     Physical Exam: General:  Blind woman lying in bed. No resp difficulty HEENT: normal Neck: supple. JVP jaw . Carotids 2+ bilat; no bruits. No lymphadenopathy or thryomegaly appreciated. Cor: PMI nondisplaced. Regular brady  . No rubs, gallops or murmurs. Lungs: clear Abdomen: soft, nontender, nondistended. No hepatosplenomegaly. No bruits or masses. Good bowel sounds. Extremities: no cyanosis, clubbing, rash, 2+ edema Neuro: alert & orientedx3, moves all 4 extremities w/o difficulty. Affect pleasant  Telemetry: SBrady 71s (reviewed personally)  Labs: Basic Metabolic Panel:  Recent Labs Lab 11/19/14 1215 11/20/14 0410  NA 139 140  K 4.1 3.5  CL 104 106  CO2 25 27  GLUCOSE 113* 58*  BUN 30* 30*  CREATININE 2.03* 2.02*  CALCIUM 8.8 9.0    Liver Function Tests: No results for input(s): AST, ALT, ALKPHOS, BILITOT, PROT, ALBUMIN in the last 168 hours. No results for input(s): LIPASE, AMYLASE in the last 168 hours. No results for input(s): AMMONIA in the last 168 hours.  CBC:  Recent Labs Lab 11/19/14 1215 11/20/14 0334  WBC 6.8  --   NEUTROABS 5.2  --   HGB 7.6* 9.6*  HCT 24.7* 30.5*  MCV 91.1  --   PLT 304  --     Cardiac Enzymes: No results for input(s): CKTOTAL, CKMB, CKMBINDEX, TROPONINI in the last 168 hours.  BNP: BNP (last 3 results)  Recent Labs  10/07/14 1327 10/31/14 1645 11/19/14 1215  BNP 2584.7* 1931.3* 2140.8*    ProBNP (last 3 results) No results for input(s): PROBNP in the last 8760 hours.    Other results:    Imaging: Dg Chest 2 View  11/20/2014   CLINICAL DATA:  Shortness of breath, CHF, history of CABG  EXAM: CHEST  2 VIEW  COMPARISON:  AP and lateral chest x-ray of November 19, 2014  FINDINGS: The lungs remain hyperinflated. Bilateral pleural effusions have decreased in volume. The cardiac silhouette remains enlarged. The pulmonary vascularity remains engorged. The pulmonary interstitial markings remain mildly increased. There are 5 intact sternal wires demonstrated. The bony thorax exhibits no acute abnormality.  IMPRESSION: Findings consistent with CHF superimposed upon COPD. Pulmonary edema persists. The bilateral pleural effusions are less prominent.   Electronically Signed   By: David  [place]   On: 11/20/2014 08:06   Dg Chest 2 View  11/19/2014   CLINICAL DATA:  69 year old female with increasing shortness of breath and weight gain. Clinical history includes CHF.  EXAM: CHEST  2 VIEW  COMPARISON:  Prior chest x-ray 10/31/2014  FINDINGS: Stable cardiomegaly and mediastinal contours. Patient is status post median sternotomy with evidence of prior multivessel CABG. Atherosclerotic calcification present in the transverse aorta. Similar degree of pulmonary vascular congestion without overt edema. Small bilateral layering pleural effusions are present and perhaps slightly enlarged compared to prior. There is associated bibasilar atelectasis. Small volume fluid knee tracks along the minor fissure. No focal airspace consolidation or pneumothorax. No acute osseous abnormality.  IMPRESSION: 1. Stable cardiomegaly and pulmonary vascular congestion without overt pulmonary edema. 2. Slight interval enlargement of bilateral layering pleural effusions. 3. Associated mild bibasilar atelectasis.   Electronically Signed   By: Malachy Moan M.D.   On: 11/19/2014 13:16      Medications:     Scheduled Medications: . sodium chloride   Intravenous Once  . amLODipine  10 mg Oral Daily  . ARIPiprazole  5 mg Oral Daily  . aspirin  325 mg Oral Daily  . atorvastatin  80 mg Oral QHS  . clopidogrel  75 mg Oral Daily  . dorzolamide-timolol  1 drop Both Eyes Q12H  . doxazosin  1 mg Oral  q1800  . enoxaparin (LOVENOX) injection  30 mg Subcutaneous Q24H  . famotidine  20 mg Oral QHS  . ferrous sulfate  325 mg Oral BID WC  . furosemide  80 mg Intravenous BID  . hydrALAZINE  100 mg Oral 3 times per day  . insulin aspart  0-15 Units Subcutaneous TID WC  . insulin aspart  0-5 Units Subcutaneous QHS  . isosorbide dinitrate  20 mg Oral TID  . levothyroxine  100 mcg Oral QAC breakfast  . pantoprazole  40 mg Oral Daily  . polyvinyl alcohol  1 drop Both Eyes BID  . prednisoLONE acetate  1 drop Left Eye QID  . sertraline  200 mg Oral QHS  . sodium chloride  3 mL Intravenous Q12H    Infusions:     PRN Medications:  sodium chloride, acetaminophen, nitroGLYCERIN, ondansetron (ZOFRAN) IV, sodium chloride, traMADol   Assessment:   1. A/c diastolic HF 2. CKD, stage 3 3. Legal blindness 4. Bradycardia - coreg stopped this admit 5. Severe HTN,  uncontrolled 6. DM2 7. CAD s/p CABG   Plan/Discussion:     Volume status improving but still volume overloaded. Will continue IV diuresis. Long talk with her about the set-up at her AL facility. Will need SNF orderset prior to d/c to clearly lay out sliding scale diuretic protocol. Counseled on low sodium diet.   BP markedly elevated. Carvedilol stopped due to bradycardia. Can't get ACE/ARB due to CKD. Norvasc and hydralazine maxed. Will start doxazosin 1mg . Can titrate as tolerated. Watch for orthostasis. Probably home on Monday.     Length of Stay: 1   Arvilla Meresaniel Wenonah Milo MD 11/20/2014, 3:37 PM  Advanced Heart Failure Team Pager [phone] (M-F; 7a - 4p)  Please contact [facility] for night-coverage after hours (4p -7a ) and weekends on [email or web address]

## 2014-11-20 NOTE — Progress Notes (Signed)
[facility] PROGRESS NOTE  Cayleigh Paull WUJ:[id number] DOB: 20-Nov-1945 DOA: 11/19/2014 PCP: No PCP Per Patient  Assessment/Plan: Acute respiratory failure with hypoxia/acute on chronic diastolic heart failure grade 2/moderate pulmonary hypertension 48 mmHg -Change Lasix to 80 mg IV every 12 hours and monitor urine output and daily weight -2-D echocardiogram February 2016 with EF 60-65% and restrictive physiology consistent with diastolic dysfunction -Continue afterload reduction with hydralazine and Isordil -ACE level ordered.  -Nutrition consulted for diet education.  -negative 2 L. -Weight: 73---71 -no ace due to renal failure.    Sinus bradycardia -hold Beta blocker.  -Check TSH.    Diabetes mellitus type II, uncontrolled -hold  Lantus due to hypoglycemia.  -hemoglobin A1c pending.  -Provide sliding scale insulin   Hypertension -Continue medications above including Norvasc   CKD (chronic kidney disease), stage II -Cr baseline at 1.9 -Follow labs with diuresis   Anemia, iron deficiency anemia.  -Check anemia panel -S?P one unit PRBC.  -Check fecal occult blood -needs outpatient evaluation.    CAD -History of coronary artery bypass grafting [facility] -Appears to be asymptomatic -Defer potential Myoview stress testing to cardiology team   Hypothyroidism -TSH in February greater than 10 with a free T4 of 1.02 -Review of patient's medication reconciliation sheet has documentation that patient not taking her Synthroid -Repeat TSH this admission -Continue previously documented Synthroid 100 g daily   Dyslipidemia -Continue Lipitor   GERD  -Continue PPI and H2 blocker  Code Status: full code.  Family Communication: Care discussed with patient Disposition Plan: remain inpatient for diuresis   Consultants:  Cardiology  Procedures:  none  Antibiotics:  none  HPI/Subjective: Relates feeling better, breathing better.   She doesn't know how she gain 15 pound in 10 days.    Objective: Filed Vitals:   11/20/14 0525  BP: 178/59  Pulse: 46  Temp: 98.2 F (36.8 C)  Resp: 18    Intake/Output Summary (Last 24 hours) at 11/20/14 0954 Last data filed at 11/20/14 0906  Gross per 24 hour  Intake    915 ml  Output   3476 ml  Net  -2561 ml   Filed Weights   11/19/14 1056 11/19/14 1646 11/20/14 0525  Weight: 75.297 kg (166 lb) 73.256 kg (161 lb 8 oz) 71.396 kg (157 lb 6.4 oz)    Exam:   General:  Alert in no distress, morbid obese.   Cardiovascular: S 1, S 2 RRR  Respiratory: Crackles bases.   Abdomen: BS present, obese NT  Musculoskeletal: plus 2 edema.   Data Reviewed: Basic Metabolic Panel:  Recent Labs Lab 11/19/14 1215 11/20/14 0410  NA 139 140  K 4.1 3.5  CL 104 106  CO2 25 27  GLUCOSE 113* 58*  BUN 30* 30*  CREATININE 2.03* 2.02*  CALCIUM 8.8 9.0   Liver Function Tests: No results for input(s): AST, ALT, ALKPHOS, BILITOT, PROT, ALBUMIN in the last 168 hours. No results for input(s): LIPASE, AMYLASE in the last 168 hours. No results for input(s): AMMONIA in the last 168 hours. CBC:  Recent Labs Lab 11/19/14 1215 11/20/14 0334  WBC 6.8  --   NEUTROABS 5.2  --   HGB 7.6* 9.6*  HCT 24.7* 30.5*  MCV 91.1  --   PLT 304  --    Cardiac Enzymes: No results for input(s): CKTOTAL, CKMB, CKMBINDEX, TROPONINI in the last 168 hours. BNP (last 3 results)  Recent Labs  10/07/14 1327 10/31/14 1645 11/19/14 1215  BNP 2584.7* 1931.3*  2140.8*    ProBNP (last 3 results) No results for input(s): PROBNP in the last 8760 hours.  CBG:  Recent Labs Lab 11/19/14 1652 11/19/14 2057 11/20/14 0600 11/20/14 0626 11/20/14 0643  GLUCAP 130* 143* 50* 52* 84    No results found for this or any previous visit (from the past 240 hour(s)).   Studies: Dg Chest 2 View  11/20/2014   CLINICAL DATA:  Shortness of breath, CHF, history of CABG  EXAM: CHEST  2 VIEW  COMPARISON:  AP  and lateral chest x-ray of November 19, 2014  FINDINGS: The lungs remain hyperinflated. Bilateral pleural effusions have decreased in volume. The cardiac silhouette remains enlarged. The pulmonary vascularity remains engorged. The pulmonary interstitial markings remain mildly increased. There are 5 intact sternal wires demonstrated. The bony thorax exhibits no acute abnormality.  IMPRESSION: Findings consistent with CHF superimposed upon COPD. Pulmonary edema persists. The bilateral pleural effusions are less prominent.   Electronically Signed   By: David  [place]   On: 11/20/2014 08:06   Dg Chest 2 View  11/19/2014   CLINICAL DATA:  69 year old female with increasing shortness of breath and weight gain. Clinical history includes CHF.  EXAM: CHEST  2 VIEW  COMPARISON:  Prior chest x-ray 10/31/2014  FINDINGS: Stable cardiomegaly and mediastinal contours. Patient is status post median sternotomy with evidence of prior multivessel CABG. Atherosclerotic calcification present in the transverse aorta. Similar degree of pulmonary vascular congestion without overt edema. Small bilateral layering pleural effusions are present and perhaps slightly enlarged compared to prior. There is associated bibasilar atelectasis. Small volume fluid knee tracks along the minor fissure. No focal airspace consolidation or pneumothorax. No acute osseous abnormality.  IMPRESSION: 1. Stable cardiomegaly and pulmonary vascular congestion without overt pulmonary edema. 2. Slight interval enlargement of bilateral layering pleural effusions. 3. Associated mild bibasilar atelectasis.   Electronically Signed   By: Malachy Moan M.D.   On: 11/19/2014 13:16    Scheduled Meds: . sodium chloride   Intravenous Once  . amLODipine  10 mg Oral Daily  . ARIPiprazole  5 mg Oral Daily  . aspirin  325 mg Oral Daily  . atorvastatin  80 mg Oral QHS  . clopidogrel  75 mg Oral Daily  . dorzolamide-timolol  1 drop Both Eyes Q12H  . enoxaparin (LOVENOX)  injection  30 mg Subcutaneous Q24H  . famotidine  20 mg Oral QHS  . furosemide  80 mg Intravenous Q12H  . hydrALAZINE  100 mg Oral 3 times per day  . insulin aspart  0-15 Units Subcutaneous TID WC  . insulin aspart  0-5 Units Subcutaneous QHS  . isosorbide dinitrate  20 mg Oral TID  . levothyroxine  100 mcg Oral QAC breakfast  . pantoprazole  40 mg Oral Daily  . polyvinyl alcohol  1 drop Both Eyes BID  . prednisoLONE acetate  1 drop Left Eye QID  . sertraline  200 mg Oral QHS  . sodium chloride  3 mL Intravenous Q12H   Continuous Infusions:   Active Problems:   Diabetes mellitus type II, uncontrolled   Hypertension   CAD (coronary artery disease)   CKD (chronic kidney disease), stage II   Hypothyroidism   Dyslipidemia   GERD (gastroesophageal reflux disease)   Acute respiratory failure with hypoxia   Sinus bradycardia   Anemia   Acute on chronic diastolic heart failure   Acute diastolic heart failure, NYHA class 1    Time spent: 35 minutes.     Regalado,  Belkys A  [facility] Pager [phone]. If 7PM-7AM, please contact night-coverage at [email or web address], password [email or web address] 11/20/2014, 9:54 AM  LOS: 1 day

## 2014-11-21 LAB — HEMOGLOBIN A1C
Hgb A1c MFr Bld: 6.5 % — ABNORMAL HIGH (ref 4.8–5.6)
MEAN PLASMA GLUCOSE: 140 mg/dL

## 2014-11-21 LAB — BASIC METABOLIC PANEL
Anion gap: 9 (ref 5–15)
BUN: 28 mg/dL — ABNORMAL HIGH (ref 6–23)
CHLORIDE: 103 mmol/L (ref 96–112)
CO2: 27 mmol/L (ref 19–32)
CREATININE: 2.03 mg/dL — AB (ref 0.50–1.10)
Calcium: 8.7 mg/dL (ref 8.4–10.5)
GFR calc Af Amer: 27 mL/min — ABNORMAL LOW (ref >90)
GFR, EST NON AFRICAN AMERICAN: 24 mL/min — AB (ref >90)
GLUCOSE: 107 mg/dL — AB (ref 70–99)
Potassium: 4 mmol/L (ref 3.5–5.1)
SODIUM: 139 mmol/L (ref 135–145)

## 2014-11-21 LAB — CBC
HCT: 30.1 % — ABNORMAL LOW (ref 36.0–46.0)
Hemoglobin: 9.3 g/dL — ABNORMAL LOW (ref 12.0–15.0)
MCH: 27.8 pg (ref 26.0–34.0)
MCHC: 30.9 g/dL (ref 30.0–36.0)
MCV: 90.1 fL (ref 78.0–100.0)
PLATELETS: 298 10*3/uL (ref 150–400)
RBC: 3.34 MIL/uL — AB (ref 3.87–5.11)
RDW: 16.1 % — ABNORMAL HIGH (ref 11.5–15.5)
WBC: 6.8 10*3/uL (ref 4.0–10.5)

## 2014-11-21 LAB — GLUCOSE, CAPILLARY
GLUCOSE-CAPILLARY: 116 mg/dL — AB (ref 70–99)
Glucose-Capillary: 146 mg/dL — ABNORMAL HIGH (ref 70–99)
Glucose-Capillary: 159 mg/dL — ABNORMAL HIGH (ref 70–99)
Glucose-Capillary: 72 mg/dL (ref 70–99)

## 2014-11-21 LAB — ANGIOTENSIN CONVERTING ENZYME: Angiotensin-Converting Enzyme: 25 U/L (ref 14–82)

## 2014-11-21 LAB — TSH: TSH: 9.575 u[IU]/mL — ABNORMAL HIGH (ref 0.350–4.500)

## 2014-11-21 MED ORDER — ACETAMINOPHEN 325 MG PO TABS
650.0000 mg | ORAL_TABLET | ORAL | Status: DC | PRN
  Administered 2014-11-21 – 2014-11-22 (×2): 650 mg via ORAL
  Filled 2014-11-21 (×2): qty 2

## 2014-11-21 MED ORDER — LEVOFLOXACIN IN D5W 750 MG/150ML IV SOLN
750.0000 mg | Freq: Once | INTRAVENOUS | Status: AC
  Administered 2014-11-21: 750 mg via INTRAVENOUS
  Filled 2014-11-21: qty 150

## 2014-11-21 MED ORDER — LEVOFLOXACIN IN D5W 500 MG/100ML IV SOLN
500.0000 mg | INTRAVENOUS | Status: DC
  Filled 2014-11-21: qty 100

## 2014-11-21 MED ORDER — DOXAZOSIN MESYLATE 2 MG PO TABS
2.0000 mg | ORAL_TABLET | Freq: Every day | ORAL | Status: DC
  Administered 2014-11-21: 2 mg via ORAL
  Filled 2014-11-21 (×2): qty 1

## 2014-11-21 MED ORDER — MUPIROCIN 2 % EX OINT
1.0000 "application " | TOPICAL_OINTMENT | Freq: Two times a day (BID) | CUTANEOUS | Status: AC
  Administered 2014-11-21 – 2014-11-25 (×10): 1 via NASAL
  Filled 2014-11-21: qty 22

## 2014-11-21 MED ORDER — CHLORHEXIDINE GLUCONATE CLOTH 2 % EX PADS
6.0000 | MEDICATED_PAD | Freq: Every day | CUTANEOUS | Status: AC
  Administered 2014-11-21 – 2014-11-25 (×5): 6 via TOPICAL

## 2014-11-21 NOTE — Progress Notes (Signed)
Subjective:    Valentino HueDorcas Nickey is a 69 y.o. female with a history of GERD, HLD, HTN, DM, glaucoma/legal blidness, CKD, anemia, hypothyroidism, CAD [place] and chronic diastolic CHF with multiple recent readmissions who [facility] with recurrent [facility]F.   Patient was recently hospitalized from 2/3-2/6 because of HF exacerbation. She was new to our cardiology service and seen in consult by Dr. Eden EmmsNishan. 2D ECHO on 10/08/14 w/ EF 60-65%, no RWMAs, doppler parameters c/w restrictive physiology. PA pk pressure 48. She was successfully diuresed in the hospital and discharged on 80 mg Lasix daily. Her weight was 144lbs at discharge. Outpatient cardiology follow up was supposed to be arranged with nuclear stress testing in the office. However; she did not take her lasix as prescribed and was re-admitted with another CHF exacerbation from 2/27-3/01. She was discharged on 80mg  Lasix qd. Discharge weight 151lbs. However, patient said that she was only taking 80mg  alternating with 40mg  every other day. She was seen by the in-house PA at her assisted living facility last Wednesday and Lasix was increased to 80mg  qd due to increased SOB. However, she has continue to gain weight. She was readmitted 3/17 up 15lbs with weight 166 lbs. The advanced HF team was asked to assume care.   Diuresed 5 pounds overnight. Feeling much better. Weight now 152, baseline 144 lbs. Breathing much better. The patient was reassured that everything is heading in the right direction, she was tearful when I walked to the room about the whole situation.    Objective:   Weight Range:  Vital Signs:   Temp:  [98 F (36.7 C)-98.4 F (36.9 C)] 98 F (36.7 C) (03/19 0514) Pulse Rate:  [51-58] 58 (03/19 1016) Resp:  [18] 18 (03/19 1016) BP: (168-185)/(47-57) 183/55 mmHg (03/19 1016) SpO2:  [93 %-98 %] 93 % (03/19 1016) Weight:  [152 lb 3.2 oz (69.037 kg)] 152 lb 3.2 oz (69.037 kg) (03/19 0514) Last BM  Date: 11/20/14  Weight change: Filed Weights   11/19/14 1646 11/20/14 0525 11/21/14 0514  Weight: 161 lb 8 oz (73.256 kg) 157 lb 6.4 oz (71.396 kg) 152 lb 3.2 oz (69.037 kg)    Intake/Output:   Intake/Output Summary (Last 24 hours) at 11/21/14 1302 Last data filed at 11/21/14 1100  Gross per 24 hour  Intake    963 ml  Output   4200 ml  Net  -3237 ml     Physical Exam: General:  Blind woman lying in bed. No resp difficulty HEENT: normal Neck: supple. JVP jaw . Carotids 2+ bilat; no bruits. No lymphadenopathy or thryomegaly appreciated. Cor: PMI nondisplaced. Regular brady  . No rubs, gallops or murmurs. Lungs: clear Abdomen: soft, nontender, nondistended. No hepatosplenomegaly. No bruits or masses. Good bowel sounds. Extremities: no cyanosis, clubbing, rash, 2+ edema Neuro: alert & orientedx3, moves all 4 extremities w/o difficulty. Affect pleasant  Telemetry: SBrady 3150s (reviewed personally)  Labs: Basic Metabolic Panel:  Recent Labs Lab 11/19/14 1215 11/20/14 0410 11/21/14 0358  NA 139 140 139  K 4.1 3.5 4.0  CL 104 106 103  CO2 25 27 27   GLUCOSE 113* 58* 107*  BUN 30* 30* 28*  CREATININE 2.03* 2.02* 2.03*  CALCIUM 8.8 9.0 8.7    Recent Labs Lab 11/19/14 1215 11/20/14 0334 11/21/14 0358  WBC 6.8  --  6.8  NEUTROABS 5.2  --   --   HGB 7.6* 9.6* 9.3*  HCT 24.7* 30.5* 30.1*  MCV  91.1  --  90.1  PLT 304  --  298   BNP (last 3 results)  Recent Labs  10/07/14 1327 10/31/14 1645 11/19/14 1215  BNP 2584.7* 1931.3* 2140.8*    ProBNP (last 3 results) No results for input(s): PROBNP in the last 8760 hours.    Other results:    Imaging: Dg Chest 2 View  11/20/2014   CLINICAL DATA:  Shortness of breath, CHF, history of CABG  EXAM: CHEST  2 VIEW  COMPARISON:  AP and lateral chest x-ray of November 19, 2014  FINDINGS: The lungs remain hyperinflated. Bilateral pleural effusions have decreased in volume. The cardiac silhouette remains enlarged. The  pulmonary vascularity remains engorged. The pulmonary interstitial markings remain mildly increased. There are 5 intact sternal wires demonstrated. The bony thorax exhibits no acute abnormality.  IMPRESSION: Findings consistent with CHF superimposed upon COPD. Pulmonary edema persists. The bilateral pleural effusions are less prominent.   Electronically Signed   By: David  [place]   On: 11/20/2014 08:06     Medications:     Scheduled Medications: . sodium chloride   Intravenous Once  . amLODipine  10 mg Oral Daily  . ARIPiprazole  5 mg Oral Daily  . aspirin  325 mg Oral Daily  . atorvastatin  80 mg Oral QHS  . Chlorhexidine Gluconate Cloth  6 each Topical Q0600  . clopidogrel  75 mg Oral Daily  . dorzolamide-timolol  1 drop Both Eyes Q12H  . doxazosin  1 mg Oral q1800  . enoxaparin (LOVENOX) injection  30 mg Subcutaneous Q24H  . famotidine  20 mg Oral QHS  . ferrous sulfate  325 mg Oral BID WC  . fluticasone  1 spray Each Nare Daily  . furosemide  80 mg Intravenous BID  . hydrALAZINE  100 mg Oral 3 times per day  . insulin aspart  0-15 Units Subcutaneous TID WC  . insulin aspart  0-5 Units Subcutaneous QHS  . isosorbide dinitrate  20 mg Oral TID  . levothyroxine  100 mcg Oral QAC breakfast  . mupirocin ointment  1 application Nasal BID  . pantoprazole  40 mg Oral Daily  . polyvinyl alcohol  1 drop Both Eyes BID  . prednisoLONE acetate  1 drop Left Eye QID  . sertraline  200 mg Oral QHS  . sodium chloride  3 mL Intravenous Q12H    Infusions:    PRN Medications: sodium chloride, acetaminophen, nitroGLYCERIN, ondansetron (ZOFRAN) IV, sodium chloride, traMADol   Assessment:   1. A/c diastolic HF 2. CKD, stage 3 3. Legal blindness 4. Bradycardia - coreg stopped this admit 5. Severe HTN, uncontrolled 6. DM2 7. CAD s/p CABG   Plan/Discussion:     Volume status improving but still volume overloaded. Will continue IV diuresis. Long talk with her about the set-up at her  AL facility. Will need SNF orderset prior to d/c to clearly lay out sliding scale diuretic protocol. Counseled on low sodium diet.   BP markedly elevated. Carvedilol stopped due to bradycardia. Can't get ACE/ARB due to CKD. Norvasc and hydralazine maxed. Doxazosin started yesterday, we will give additional 1 mg po today and start 2 mg po daily tomorrow. Can titrate as tolerated. Watch for orthostasis. Probably home on Monday.  Anemia has improved.  Length of Stay: 2   Lars Masson MD 11/21/2014, 1:02 PM

## 2014-11-21 NOTE — Evaluation (Signed)
Physical Therapy Evaluation Patient Details Name: Taylor Padilla MRN: [id number] DOB: [birth year] Today's Date: 11/21/2014   History of Present Illness  Patient is a 69 yo female admitted 11/19/14 with recurrent CHF, uncontrolled HTN, bradycardia.  PMH:  BLIND, DM, HTN, CDK, CAD, s/p CABG  Clinical Impression  Patient presents with problems listed below.  Will benefit from acute PT to maximize independence prior to discharge. Patient upset today because "I cannot do anything by myself here.  I can't see".  Provided understanding.  Required encouragement to participate with PT.  Feel patient will return to more independent level of function once she returns to her home/familiar surroundings.  Do not anticipate any f/u PT needs at d/c.    Follow Up Recommendations No PT follow up;Supervision - Intermittent;Other (comment) (Patient receives assist for medications)    Equipment Recommendations  None recommended by PT    Recommendations for Other Services       Precautions / Restrictions Precautions Precautions: Fall Precaution Comments: Pt is legally blind Restrictions Weight Bearing Restrictions: No      Mobility  Bed Mobility Overal bed mobility: Needs Assistance Bed Mobility: Supine to Sit     Supine to sit: Min assist     General bed mobility comments: Verbal cues for technique.  Assist for location of rail.  Assist to scoot to EOB in sitting.  Min guard for safety.  Transfers Overall transfer level: Needs assistance Equipment used: 1 person hand held assist Transfers: Sit to/from [employer] Sit to Stand: Min guard Stand pivot transfers: Min assist       General transfer comment: Verbal cues for technique. Auditory cues for location of chair.  Assist to pivot to chair.  Patient able to reach back for chair and sit.  Ambulation/Gait             General Gait Details: Declined gait this pm.  Stairs            Wheelchair Mobility     Modified Rankin (Stroke Patients Only)       Balance                                             Pertinent Vitals/Pain Pain Assessment: No/denies pain    Home Living Family/patient expects to be discharged to:: Assisted living               Home Equipment: Walker - 2 wheels;Walker - 4 wheels Additional Comments: They do her meds and she goes to the dining room for meals, otherwise she does all her basic self care    Prior Function Level of Independence: Independent with assistive device(s)               Hand Dominance   Dominant Hand: Right    Extremity/Trunk Assessment   Upper Extremity Assessment: Overall WFL for tasks assessed           Lower Extremity Assessment: Generalized weakness         Communication   Communication: No difficulties  Cognition Arousal/Alertness: Awake/alert Behavior During Therapy: Anxious (Due to unknown environment) Overall Cognitive Status: Within Functional Limits for tasks assessed                      General Comments      Exercises        Assessment/Plan  PT Assessment Patient needs continued PT services  PT Diagnosis Difficulty walking;Generalized weakness   PT Problem List Decreased strength;Decreased activity tolerance;Decreased balance;Decreased mobility  PT Treatment Interventions DME instruction;Gait training;Functional mobility training;Therapeutic activities;Patient/family education   PT Goals (Current goals can be found in the Care Plan section) Acute Rehab PT Goals Patient Stated Goal: home soon PT Goal Formulation: With patient Time For Goal Achievement: 11/28/14 Potential to Achieve Goals: Good    Frequency Min 3X/week   Barriers to discharge        Co-evaluation               End of Session Equipment Utilized During Treatment: Gait belt Activity Tolerance: Patient limited by fatigue Patient left: in chair;with call bell/phone within reach Nurse  Communication: Mobility status (Request to sit up for only 30 minutes)         Time: [id number] PT Time Calculation (min) (ACUTE ONLY): 17 min   Charges:   PT Evaluation $Initial PT Evaluation Tier I: 1 Procedure     PT G CodesVena Austria:        Garald Rhew H 11/21/2014, 8:17 PM Durenda HurtSusan H. Renaldo Fiddleravis, PT, [facility] Acute Rehab Services Pager [phone]

## 2014-11-21 NOTE — Consult Note (Signed)
ANTIBIOTIC CONSULT NOTE - INITIAL  Pharmacy Consult for Levaquin Indication: r/o pneumonia  Allergies  Allergen Reactions  . Epinephrine Other (See Comments)    Patient says she is not allergic to epi, but that it makes her jittery.  WILL RECEIVE IF NEEDED IN AN EMERGENCY.    Patient Measurements: Height: 5\' 1"  (154.9 cm) Weight: 152 lb 3.2 oz (69.037 kg) (scale b) IBW/kg (Calculated) : 47.8  Vital Signs: Temp: 101.8 F (38.8 C) (03/19 2034) Temp Source: Oral (03/19 2034) BP: 175/62 mmHg (03/19 2034) Pulse Rate: 68 (03/19 2034) Intake/Output from previous day: 03/18 0701 - 03/19 0700 In: 1300 [P.O.:1300] Out: 3401 [Urine:3400; Stool:1] Intake/Output from this shift:    Labs:  Recent Labs  11/19/14 1215 11/20/14 0334 11/20/14 0410 11/21/14 0358  WBC 6.8  --   --  6.8  HGB 7.6* 9.6*  --  9.3*  PLT 304  --   --  298  CREATININE 2.03*  --  2.02* 2.03*   Estimated Creatinine Clearance: 22.9 mL/min (by C-G formula based on Cr of 2.03).  Microbiology: Recent Results (from the past 720 hour(s))  MRSA PCR Screening     Status: Abnormal   Collection Time: 11/01/14 12:02 AM  Result Value Ref Range Status   MRSA by PCR POSITIVE (A) NEGATIVE Final    Comment:        The GeneXpert MRSA Assay (FDA approved for NASAL specimens only), is one component of a comprehensive MRSA colonization surveillance program. It is not intended to diagnose MRSA infection nor to guide or monitor treatment for MRSA infections. RESULT CALLED TO, READ BACK BY AND VERIFIED WITH: RASUL,G RN 0301 11/01/14 MITCHELL,L     Medical History: Past Medical History  Diagnosis Date  . Hypertension   . CAD (coronary artery disease)   . 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 Macular edema     hx  . Glaucoma, both eyes   . High cholesterol   . CHF (congestive heart failure)   . Chronic kidney disease (CKD), stage II (mild)     [name]/notes 10/07/2014  . Old myocardial infarct     "I was told I've had  one; don't know when" (10/08/2014)  . Pneumonia 1990's X 1  . Hypothyroidism   . Type II diabetes mellitus   . Anemia   . History of blood transfusion 2003    "related to [facility]"  . Stroke     "I've been told I've had one; I don't remember having any reaction from it at all"; denies residual on 10/08/2014   Assessment: 69yof with history of COPD/respiratory failure now febrile to 101.8. She will begin levaquin to cover for pneumonia. Renal insufficiency noted with sCr 2.03 and CrCl 3623ml/min.  Goal of Therapy:  Appropriate dosing  Plan:  1) Levaquin 750mg  x 1 then 500mg  q48 2) Follow renal function, cultures, LOT  Fredrik RiggerMarkle, Kayron Kalmar Sue 11/21/2014,8:55 PM

## 2014-11-21 NOTE — Progress Notes (Signed)
[facility] PROGRESS NOTE  Taylor [name] [id number]:[id number] DOB: 03-19-1946 DOA: 11/19/2014 PCP: No PCP Per Patient  Assessment/Plan: Acute respiratory failure with hypoxia/acute on chronic diastolic heart failure grade 2/moderate pulmonary hypertension 48 mmHg -Lasix to 80 mg IV every 12 hours and monitor urine output and daily weight -2-D echocardiogram February 2016 with EF 60-65% and restrictive physiology consistent with diastolic dysfunction -Continue afterload reduction with hydralazine and Isordil -Angiotensin Convertin enzymes.  level ordered.  -Nutrition consulted for diet education.  -negative 2 L. -Weight: 73---71---69 -no ace due to renal failure.    Sinus bradycardia -Hold Beta blocker.  -TSH: 9.5   Diabetes mellitus type II, uncontrolled -hold  Lantus due to hypoglycemia.  -hemoglobin A1c :  6.5 -Provide sliding scale insulin  Depression:  On Abilify, Zoloft  Psych consulted to assist with medications.    Hypertension -Continue medications above including Norvasc -started on Doxazosin 3-18, dose increase 3-19/    CKD (chronic kidney disease), stage II -Cr baseline at 1.9 -Follow labs with diuresis. Cr stable at 2.0    Anemia, iron deficiency anemia.  -Check anemia panel -S?P one unit PRBC.  -Check fecal occult blood -needs outpatient evaluation.    CAD -[facility] [facility] -Appears to be asymptomatic -Defer potential Myoview stress testing to cardiology team   Hypothyroidism -TSH in February greater than 10 with a free T4 of 1.02 -Review of patient's medication reconciliation sheet has documentation that patient not taking her Synthroid -Repeat TSH at 9.5  -Continue previously documented Synthroid 100 g daily   Dyslipidemia -Continue Lipitor   GERD  -Continue PPI and H2 blocker  Code Status: full code.  Family Communication: Care discussed with patient Disposition Plan:  remain inpatient for diuresis   Consultants:  Cardiology  Procedures:  none  Antibiotics:  none  HPI/Subjective: She is crying because she is not able to do anything, with her chronic vision problems. Denies worsening loss of vision.  She is breathing better. She denies refusing medications at the facility. The only medications that she has refused are lantus and SS if she doesn't eat./  She agree to speak with the psychiatrist  Patient is willing to take her medications.    Objective: Filed Vitals:   11/21/14 1016  BP: 183/55  Pulse: 58  Temp:   Resp: 18    Intake/Output Summary (Last 24 hours) at 11/21/14 1325 Last data filed at 11/21/14 1100  Gross per 24 hour  Intake    963 ml  Output   4200 ml  Net  -3237 ml   Filed Weights   11/19/14 1646 11/20/14 0525 11/21/14 0514  Weight: 73.256 kg (161 lb 8 oz) 71.396 kg (157 lb 6.4 oz) 69.037 kg (152 lb 3.2 oz)    Exam:   General:  Alert in no distress, morbid obese.   Cardiovascular: S 1, S 2 RRR  Respiratory: Crackles bases.   Abdomen: BS present, obese NT  Musculoskeletal: plus 2 edema.   Data Reviewed: Basic Metabolic Panel:  Recent Labs Lab 11/19/14 1215 11/20/14 0410 11/21/14 0358  NA 139 140 139  K 4.1 3.5 4.0  CL 104 106 103  CO2 25 27 27   GLUCOSE 113* 58* 107*  BUN 30* 30* 28*  CREATININE 2.03* 2.02* 2.03*  CALCIUM 8.8 9.0 8.7   Liver Function Tests: No results for input(s): AST, ALT, ALKPHOS, BILITOT, PROT, ALBUMIN in the last 168 hours. No results for input(s): LIPASE, AMYLASE in the last 168 hours.  No results for input(s): AMMONIA in the last 168 hours. CBC:  Recent Labs Lab 11/19/14 1215 11/20/14 0334 11/21/14 0358  WBC 6.8  --  6.8  NEUTROABS 5.2  --   --   HGB 7.6* 9.6* 9.3*  HCT 24.7* 30.5* 30.1*  MCV 91.1  --  90.1  PLT 304  --  298   Cardiac Enzymes: No results for input(s): CKTOTAL, CKMB, CKMBINDEX, TROPONINI in the last 168 hours. BNP (last 3 results)  Recent  Labs  10/07/14 1327 10/31/14 1645 11/19/14 1215  BNP 2584.7* 1931.3* 2140.8*    ProBNP (last 3 results) No results for input(s): PROBNP in the last 8760 hours.  CBG:  Recent Labs Lab 11/20/14 1632 11/20/14 2110 11/20/14 2159 11/21/14 0621 11/21/14 1204  GLUCAP 136* 68* 88 116* 146*    No results found for this or any previous visit (from the past 240 hour(s)).   Studies: Dg Chest 2 View  11/20/2014   CLINICAL DATA:  Shortness of breath, CHF, history of CABG  EXAM: CHEST  2 VIEW  COMPARISON:  AP and lateral chest x-ray of November 19, 2014  FINDINGS: The lungs remain hyperinflated. Bilateral pleural effusions have decreased in volume. The cardiac silhouette remains enlarged. The pulmonary vascularity remains engorged. The pulmonary interstitial markings remain mildly increased. There are 5 intact sternal wires demonstrated. The bony thorax exhibits no acute abnormality.  IMPRESSION: Findings consistent with CHF superimposed upon COPD. Pulmonary edema persists. The bilateral pleural effusions are less prominent.   Electronically Signed   By: David  [place]   On: 11/20/2014 08:06    Scheduled Meds: . sodium chloride   Intravenous Once  . amLODipine  10 mg Oral Daily  . ARIPiprazole  5 mg Oral Daily  . aspirin  325 mg Oral Daily  . atorvastatin  80 mg Oral QHS  . Chlorhexidine Gluconate Cloth  6 each Topical Q0600  . clopidogrel  75 mg Oral Daily  . dorzolamide-timolol  1 drop Both Eyes Q12H  . doxazosin  1 mg Oral q1800  . enoxaparin (LOVENOX) injection  30 mg Subcutaneous Q24H  . famotidine  20 mg Oral QHS  . ferrous sulfate  325 mg Oral BID WC  . fluticasone  1 spray Each Nare Daily  . furosemide  80 mg Intravenous BID  . hydrALAZINE  100 mg Oral 3 times per day  . insulin aspart  0-15 Units Subcutaneous TID WC  . insulin aspart  0-5 Units Subcutaneous QHS  . isosorbide dinitrate  20 mg Oral TID  . levothyroxine  100 mcg Oral QAC breakfast  . mupirocin ointment  1  application Nasal BID  . pantoprazole  40 mg Oral Daily  . polyvinyl alcohol  1 drop Both Eyes BID  . prednisoLONE acetate  1 drop Left Eye QID  . sertraline  200 mg Oral QHS  . sodium chloride  3 mL Intravenous Q12H   Continuous Infusions:   Active Problems:   Diabetes mellitus type II, uncontrolled   Hypertension   CAD (coronary artery disease)   CKD (chronic kidney disease), stage II   Hypothyroidism   Dyslipidemia   GERD (gastroesophageal reflux disease)   Acute respiratory failure with hypoxia   Sinus bradycardia   Anemia   Acute on chronic diastolic heart failure   Acute diastolic heart failure, NYHA class 1    Time spent: 35 minutes.     Hartley Barefoot A  [facility] Pager [phone]. If 7PM-7AM, please contact night-coverage at [email or web address],  password [email or web address] 11/21/2014, 1:25 PM  LOS: 2 days

## 2014-11-21 NOTE — Progress Notes (Signed)
CSW (Clinical Child psychotherapistocial Worker) notified pt has been upset and crying all day because of issues with family. CSW asked to speak with pt. CSW visited with pt who was laying in bed with blanket over her head. Pt very polite and did remove blanket to speak with CSW. Pt reported that she has been upset today because she is frustrated with impaired vision and that she is unable to do anything for herself at the hospital. Pt provided example that she cannot even change the tv channel and has to call nursing to assist. CSW provided support to pt. Pt was not tearful during discussion. CSW asked it pt would like to talk about her family. Pt informed CSW that she is thankful CSW present to speak about this but she does not want to talk about her family right now. CSW offered support and understanding. CSW informed pt if she wants to speak further please just asking nursing staff to call CSW. Pt thanked CSW for visit.  Quentyn Kolbeck, LCSWA [phone]

## 2014-11-21 NOTE — Progress Notes (Signed)
The patient requested something for a stuffy nose.  Daphane ShepherdM. Lynch was notified and an order for Flonase was written.

## 2014-11-22 ENCOUNTER — Inpatient Hospital Stay (HOSPITAL_COMMUNITY): Payer: Medicare PPO

## 2014-11-22 DIAGNOSIS — R509 Fever, unspecified: Secondary | ICD-10-CM

## 2014-11-22 LAB — GLUCOSE, CAPILLARY
GLUCOSE-CAPILLARY: 123 mg/dL — AB (ref 70–99)
GLUCOSE-CAPILLARY: 160 mg/dL — AB (ref 70–99)
GLUCOSE-CAPILLARY: 155 mg/dL — AB (ref 70–99)
GLUCOSE-CAPILLARY: 215 mg/dL — AB (ref 70–99)
Glucose-Capillary: 123 mg/dL — ABNORMAL HIGH (ref 70–99)
Glucose-Capillary: 145 mg/dL — ABNORMAL HIGH (ref 70–99)

## 2014-11-22 LAB — URINALYSIS, ROUTINE W REFLEX MICROSCOPIC
Bilirubin Urine: NEGATIVE
GLUCOSE, UA: NEGATIVE mg/dL
Hgb urine dipstick: NEGATIVE
KETONES UR: NEGATIVE mg/dL
Leukocytes, UA: NEGATIVE
Nitrite: NEGATIVE
Protein, ur: 100 mg/dL — AB
SPECIFIC GRAVITY, URINE: 1.01 (ref 1.005–1.030)
Urobilinogen, UA: 0.2 mg/dL (ref 0.0–1.0)
pH: 6 (ref 5.0–8.0)

## 2014-11-22 LAB — URINE MICROSCOPIC-ADD ON

## 2014-11-22 LAB — BASIC METABOLIC PANEL
Anion gap: 9 (ref 5–15)
BUN: 30 mg/dL — ABNORMAL HIGH (ref 6–23)
CO2: 30 mmol/L (ref 19–32)
Calcium: 8.6 mg/dL (ref 8.4–10.5)
Chloride: 95 mmol/L — ABNORMAL LOW (ref 96–112)
Creatinine, Ser: 2.29 mg/dL — ABNORMAL HIGH (ref 0.50–1.10)
GFR calc Af Amer: 24 mL/min — ABNORMAL LOW (ref >90)
GFR calc non Af Amer: 20 mL/min — ABNORMAL LOW (ref >90)
GLUCOSE: 137 mg/dL — AB (ref 70–99)
Potassium: 3.5 mmol/L (ref 3.5–5.1)
Sodium: 134 mmol/L — ABNORMAL LOW (ref 135–145)

## 2014-11-22 MED ORDER — FUROSEMIDE 10 MG/ML IJ SOLN
60.0000 mg | Freq: Two times a day (BID) | INTRAMUSCULAR | Status: DC
  Administered 2014-11-22: 60 mg via INTRAVENOUS
  Filled 2014-11-22 (×4): qty 6

## 2014-11-22 MED ORDER — DOXAZOSIN MESYLATE 4 MG PO TABS
4.0000 mg | ORAL_TABLET | Freq: Every day | ORAL | Status: DC
  Administered 2014-11-22 – 2014-11-26 (×5): 4 mg via ORAL
  Filled 2014-11-22 (×5): qty 1

## 2014-11-22 NOTE — Progress Notes (Signed)
Pt has been crying periodically throughout the day.  Dr. Sunnie Nielsenegalado suggests asking pt if she would like to speak with the chaplain.  Pt refused.  Ativan requested from physician, but denied.

## 2014-11-22 NOTE — Progress Notes (Signed)
[facility] PROGRESS NOTE  Taylor Padilla ZOX:[id number] DOB: [birth year] DOA: 11/19/2014 PCP: No PCP Per Patient  Assessment/Plan: Acute respiratory failure with hypoxia/acute on chronic diastolic heart failure grade 2/moderate pulmonary hypertension 48 mmHg -Lasix to 60 mg IV every 12 hours .  -2-D echocardiogram February 2016 with EF 60-65% and restrictive physiology consistent with diastolic dysfunction -Continue afterload reduction with hydralazine and Isordil -Angiotensin Convertin enzymes at 25 normal.  -Nutrition consulted for diet education.  -negative 5 L. -Weight: 73---71---69---67 -no ace due to renal failure.   Fever:  Blood culture, urine culture ordered.  Chest x ray no evidence of PNA.  Continue with Levaquin.  Patient denies cough, diarrhea.    Sinus bradycardia -Hold Beta blocker.  -TSH: 9.5   Diabetes mellitus type II, uncontrolled -hold  Lantus due to hypoglycemia.  -hemoglobin A1c :  6.5 -Provide sliding scale insulin  Depression:  On Abilify, Zoloft  Psych consulted to assist with medications.    Hypertension -Continue medications above including Norvasc -started on Doxazosin 3-18, dose increase 3-19/    CKD (chronic kidney disease), stage II -Cr baseline at 1.9 -Follow labs with diuresis. Cr stable at 2.0    Anemia, iron deficiency anemia.  -Check anemia panel -S?P one unit PRBC.  -Check fecal occult blood -needs outpatient evaluation.    CAD -[facility] of coronary artery bypass grafting [facility] -Appears to be asymptomatic -Defer potential Myoview stress testing to cardiology team   Hypothyroidism -TSH in February greater than 10 with a free T4 of 1.02 -Review of patient's medication reconciliation sheet has documentation that patient not taking her Synthroid -Repeat TSH at 9.5  -Continue previously documented Synthroid 100 g daily   Dyslipidemia -Continue Lipitor   GERD  -Continue PPI and H2  blocker  Code Status: full code.  Family Communication: Care discussed with patient Disposition Plan: remain inpatient for diuresis   Consultants:  Cardiology  Procedures:  none  Antibiotics:  none  HPI/Subjective: She is crying because she is not able to do anything, with her chronic vision problems. Denies worsening loss of vision.  She is breathing better. She denies refusing medications at the facility. The only medications that she has refused are lantus and SS if she doesn't eat./  She agree to speak with the psychiatrist  Patient is willing to take her medications.    Objective: Filed Vitals:   11/22/14 0459  BP: 180/56  Pulse: 70  Temp: 97.4 F (36.3 C)  Resp: 18    Intake/Output Summary (Last 24 hours) at 11/22/14 1406 Last data filed at 11/22/14 1117  Gross per 24 hour  Intake    303 ml  Output    500 ml  Net   -197 ml   Filed Weights   11/20/14 0525 11/21/14 0514 11/22/14 0459  Weight: 71.396 kg (157 lb 6.4 oz) 69.037 kg (152 lb 3.2 oz) 67.949 kg (149 lb 12.8 oz)    Exam:   General:  Alert in no distress, morbid obese.   Cardiovascular: S 1, S 2 RRR  Respiratory: Crackles bases.   Abdomen: BS present, obese NT  Musculoskeletal: plus 2 edema.   Data Reviewed: Basic Metabolic Panel:  Recent Labs Lab 11/19/14 1215 11/20/14 0410 11/21/14 0358 11/22/14 0634  NA 139 140 139 134*  K 4.1 3.5 4.0 3.5  CL 104 106 103 95*  CO2 GLUCOSE 113* 58* 107* 137*  BUN 30* 30* 28* 30*  CREATININE 2.03* 2.02* 2.03* 2.29*  CALCIUM 8.8 9.0 8.7 8.6   Liver Function Tests: No results for input(s): AST, ALT, ALKPHOS, BILITOT, PROT, ALBUMIN in the last 168 hours. No results for input(s): LIPASE, AMYLASE in the last 168 hours. No results for input(s): AMMONIA in the last 168 hours. CBC:  Recent Labs Lab 11/19/14 1215 11/20/14 0334 11/21/14 0358  WBC 6.8  --  6.8  NEUTROABS 5.2  --   --   HGB 7.6* 9.6* 9.3*  HCT 24.7* 30.5* 30.1*   MCV 91.1  --  90.1  PLT 304  --  298   Cardiac Enzymes: No results for input(s): CKTOTAL, CKMB, CKMBINDEX, TROPONINI in the last 168 hours. BNP (last 3 results)  Recent Labs  10/07/14 1327 10/31/14 1645 11/19/14 1215  BNP 2584.7* 1931.3* 2140.8*    ProBNP (last 3 results) No results for input(s): PROBNP in the last 8760 hours.  CBG:  Recent Labs Lab 11/21/14 1702 11/21/14 2106 11/22/14 0609 11/22/14 0801 11/22/14 1113  GLUCAP 159* 72 123* 145* 123*    No results found for this or any previous visit (from the past 240 hour(s)).   Studies: Dg Chest 2 View  11/22/2014   CLINICAL DATA:  Pt admitted 11/19/14 with recurrent CHF, uncontrolled HTN, bradycardia.  EXAM: CHEST  2 VIEW  COMPARISON:  11/20/2014 and 11/19/2014  FINDINGS: Interstitial thickening has resolved. There are only minimal residual effusions. No areas of lung consolidation.  Changes from CABG surgery are stable.  Mild stable cardiomegaly.  Lungs are mildly hyperexpanded.  IMPRESSION: Congestive heart failure appears resolved. There are minimal residual pleural effusions.   Electronically Signed   By: Amie Portlandavid  Ormond M.D.   On: 11/22/2014 11:49    Scheduled Meds: . sodium chloride   Intravenous Once  . amLODipine  10 mg Oral Daily  . ARIPiprazole  5 mg Oral Daily  . aspirin  325 mg Oral Daily  . atorvastatin  80 mg Oral QHS  . Chlorhexidine Gluconate Cloth  6 each Topical Q0600  . clopidogrel  75 mg Oral Daily  . dorzolamide-timolol  1 drop Both Eyes Q12H  . doxazosin  4 mg Oral Daily  . enoxaparin (LOVENOX) injection  30 mg Subcutaneous Q24H  . famotidine  20 mg Oral QHS  . ferrous sulfate  325 mg Oral BID WC  . fluticasone  1 spray Each Nare Daily  . furosemide  60 mg Intravenous BID  . hydrALAZINE  100 mg Oral 3 times per day  . insulin aspart  0-15 Units Subcutaneous TID WC  . insulin aspart  0-5 Units Subcutaneous QHS  . isosorbide dinitrate  20 mg Oral TID  . [START ON 11/23/2014] levofloxacin  (LEVAQUIN) IV  500 mg Intravenous Q48H  . levothyroxine  100 mcg Oral QAC breakfast  . mupirocin ointment  1 application Nasal BID  . pantoprazole  40 mg Oral Daily  . polyvinyl alcohol  1 drop Both Eyes BID  . prednisoLONE acetate  1 drop Left Eye QID  . sertraline  200 mg Oral QHS  . sodium chloride  3 mL Intravenous Q12H   Continuous Infusions:   Active Problems:   Diabetes mellitus type II, uncontrolled   Hypertension   CAD (coronary artery disease)   CKD (chronic kidney disease), stage II   Hypothyroidism   Dyslipidemia   GERD (gastroesophageal reflux disease)   Acute respiratory failure with hypoxia   Sinus bradycardia   Anemia   Acute on chronic diastolic heart failure   Acute diastolic heart failure,  NYHA class 1    Time spent: 35 minutes.     Hartley Barefoot A  [facility] Pager [phone]. If 7PM-7AM, please contact night-coverage at [email or web address], password [facility] 11/22/2014, 2:06 PM  LOS: 3 days

## 2014-11-22 NOTE — Progress Notes (Signed)
1315 pt whistles out to hallway crying and stating "my watch is gone! I can not find my watch". I enter room and ask the pt to search her purse for her watch. Pt is dramatically crying stating "I can not do it." I asked pt for permission to help her and search purse for missing watch. Pt gave permission to search purse witnessed by [employer] ColaDidier NT. I opened front zipper and found the watch and placed in on pt L wrist. Pt thanked me.

## 2014-11-22 NOTE — Clinical Social Work Psychosocial (Addendum)
Clinical Social Work Department BRIEF PSYCHOSOCIAL ASSESSMENT 11/20/2014  Patient:  Taylor Padilla, Taylor Padilla     Account Number:  [id number]     Admit date:  11/19/2014  Clinical Social Worker:  Elam Dutch  Date/Time:  11/20/2014 03:35 PM  Referred by:  Physician  Date Referred:  11/20/2014 Referred for  Other - See comment   Other Referral:   Return to ALF when stable?   Interview type:  Patient Other interview type:    PSYCHOSOCIAL DATA Living Status:  FACILITY Admitted from facility:  Other Level of care:  Assisted Living Primary support name:  [name] [phone] Primary support relationship to patient:   Degree of support available:   Strong support    CURRENT CONCERNS Current Concerns  Post-Acute Placement   Other Concerns:   ?Return to ALF?    SOCIAL WORK ASSESSMENT / PLAN CSW met with this  69 year old female- current resident of [place].  Patient states that she is "content" there and that the care is good- however, she wished that she did not have to enter assisted care.  She is legally blind but states she can see some shadows and outlines.  CSW spoke to [facility] [employer] [facility] [place] and patient's condition was discussed. She verbalized concerns that patient is not always compliant with taking her medications and wanted to see if this was an issue at the hospital. CSW spoke to patient's nurse who stated that she had not refused any medications thus far.  Patient hopes to return to ALF and will await PT evaluation to insure that she can return there. She states that she mostly uses a wheelchair at the facility and manages well. Fl2 placed on chart for MD" signature.   Assessment/plan status:  Psychosocial Support/Ongoing Assessment of Needs Other assessment/ plan:   Information/referral to community resources:   None at this time.    PATIENT'S/FAMILY'S RESPONSE TO PLAN OF CARE: Patient is alert, oriented and welcoming to CSW during  visit.  She stated she is hopeful that she will be able to return to the ALF at d/c and does not want to consider SNF unless absolutely necessary.Patient states that she has close family support and that she feels they will want her to return there as well.  CSW asked to contact her sister Lattie Haw and she declined at this time stating that they keep in touch via telephone and that she would keep her posted on any changes in her d/c plan. Patient stated that it would be ok to call her once d/c is confirmed as she does not keep secrets from her sister.  CSW will assist patient with d/c options and provide ongoing support.   Lorie Phenix. [place], [place]

## 2014-11-22 NOTE — Progress Notes (Signed)
Subjective:    Taylor Padilla is a 69 y.o. female with a history of GERD, HLD, HTN, DM, glaucoma/legal blidness, CKD, anemia, hypothyroidism, CAD [place] 4v CAB[place] and chronic diastolic CHF with multiple recent readmissions who [facility] with recurrent [facility]F.   Patient was recently hospitalized from 2/3-2/6 because of HF exacerbation. She was new to our cardiology service and seen in consult by Dr. Eden EmmsNishan. 2D ECHO on 10/08/14 w/ EF 60-65%, no RWMAs, doppler parameters c/w restrictive physiology. PA pk pressure 48. She was successfully diuresed in the hospital and discharged on 80 mg Lasix daily. Her weight was 144lbs at discharge. Outpatient cardiology follow up was supposed to be arranged with nuclear stress testing in the office. However; she did not take her lasix as prescribed and was re-admitted with another CHF exacerbation from 2/27-3/01. She was discharged on 80mg  Lasix qd. Discharge weight 151lbs. However, patient said that she was only taking 80mg  alternating with 40mg  every other day. She was seen by the in-house PA at her assisted living facility last Wednesday and Lasix was increased to 80mg  qd due to increased SOB. However, she has continue to gain weight. She was readmitted 3/17 up 15lbs with weight 166 lbs. The advanced HF team was asked to assume care.   Diuresed only 250 cc since yesterday, feeling much better. Weight now 149, baseline 144 lbs. The patient was reassured that everything is heading in the right direction, she was tearful when I walked to the room about the whole situation.    Objective:   Weight Range:  Vital Signs:   Temp:  [97.4 F (36.3 C)-101.8 F (38.8 C)] 97.4 F (36.3 C) (03/20 0459) Pulse Rate:  [65-70] 70 (03/20 0459) Resp:  [18] 18 (03/20 0459) BP: (175-212)/(56-63) 180/56 mmHg (03/20 0459) SpO2:  [92 %-97 %] 92 % (03/20 0459) Weight:  [149 lb 12.8 oz (67.949 kg)] 149 lb 12.8 oz (67.949 kg) (03/20 0459) Last BM Date:  11/21/14  Weight change: Filed Weights   11/20/14 0525 11/21/14 0514 11/22/14 0459  Weight: 157 lb 6.4 oz (71.396 kg) 152 lb 3.2 oz (69.037 kg) 149 lb 12.8 oz (67.949 kg)    Intake/Output:   Intake/Output Summary (Last 24 hours) at 11/22/14 1145 Last data filed at 11/22/14 1117  Gross per 24 hour  Intake    543 ml  Output    800 ml  Net   -257 ml     Physical Exam: General:  Blind woman lying in bed. No resp difficulty HEENT: normal Neck: supple. JVP jaw . Carotids 2+ bilat; no bruits. No lymphadenopathy or thryomegaly appreciated. Cor: PMI nondisplaced. Regular brady  . No rubs, gallops or murmurs. Lungs: clear Abdomen: soft, nontender, nondistended. No hepatosplenomegaly. No bruits or masses. Good bowel sounds. Extremities: no cyanosis, clubbing, rash, 2+ edema Neuro: alert & orientedx3, moves all 4 extremities w/o difficulty. Affect pleasant  Telemetry: SBrady 50s (reviewed personally)  Labs: Basic Metabolic Panel:  Recent Labs Lab 11/19/14 1215 11/20/14 0410 11/21/14 0358 11/22/14 0634  NA 139 140 139 134*  K 4.1 3.5 4.0 3.5  CL 104 106 103 95*  CO2 25 27 27 30   GLUCOSE 113* 58* 107* 137*  BUN 30* 30* 28* 30*  CREATININE 2.03* 2.02* 2.03* 2.29*  CALCIUM 8.8 9.0 8.7 8.6    Recent Labs Lab 11/19/14 1215 11/20/14 0334 11/21/14 0358  WBC 6.8  --  6.8  NEUTROABS 5.2  --   --  HGB 7.6* 9.6* 9.3*  HCT 24.7* 30.5* 30.1*  MCV 91.1  --  90.1  PLT 304  --  298   BNP (last 3 results)  Recent Labs  10/07/14 1327 10/31/14 1645 11/19/14 1215  BNP 2584.7* 1931.3* 2140.8*    ProBNP (last 3 results) No results for input(s): PROBNP in the last 8760 hours.    Other results:    Imaging: No results found.   Medications:     Scheduled Medications: . sodium chloride   Intravenous Once  . amLODipine  10 mg Oral Daily  . ARIPiprazole  5 mg Oral Daily  . aspirin  325 mg Oral Daily  . atorvastatin  80 mg Oral QHS  . Chlorhexidine Gluconate Cloth   6 each Topical Q0600  . clopidogrel  75 mg Oral Daily  . dorzolamide-timolol  1 drop Both Eyes Q12H  . doxazosin  2 mg Oral q1800  . enoxaparin (LOVENOX) injection  30 mg Subcutaneous Q24H  . famotidine  20 mg Oral QHS  . ferrous sulfate  325 mg Oral BID WC  . fluticasone  1 spray Each Nare Daily  . furosemide  80 mg Intravenous BID  . hydrALAZINE  100 mg Oral 3 times per day  . insulin aspart  0-15 Units Subcutaneous TID WC  . insulin aspart  0-5 Units Subcutaneous QHS  . isosorbide dinitrate  20 mg Oral TID  . [START ON 11/23/2014] levofloxacin (LEVAQUIN) IV  500 mg Intravenous Q48H  . levothyroxine  100 mcg Oral QAC breakfast  . mupirocin ointment  1 application Nasal BID  . pantoprazole  40 mg Oral Daily  . polyvinyl alcohol  1 drop Both Eyes BID  . prednisoLONE acetate  1 drop Left Eye QID  . sertraline  200 mg Oral QHS  . sodium chloride  3 mL Intravenous Q12H    Infusions:    PRN Medications: sodium chloride, acetaminophen, nitroGLYCERIN, ondansetron (ZOFRAN) IV, sodium chloride, traMADol   Assessment:   1. A/c diastolic HF 2. CKD, stage 3 3. Legal blindness 4. Bradycardia - coreg stopped this admit 5. Severe HTN, uncontrolled 6. DM2 7. CAD s/p CABG   Plan/Discussion:     Volume status improving, now almost euvolemic, we will decrease lasix to 60 mg iv BID as Crea is trending up from 2.0 to 2.3.  Long talk with her about the set-up at her AL facility. Will need SNF orderset prior to d/c to clearly lay out sliding scale diuretic protocol. Counseled on low sodium diet.   BP markedly elevated. Carvedilol stopped due to bradycardia. Can't get ACE/ARB due to CKD. Norvasc and hydralazine maxed. Doxazosin started on Friday, we will increase to 4 mg po daily tomorrow. Can titrate as tolerated. Watch for orthostasis. Probably home on Monday.  Anemia has improved.  Length of Stay: 3   Lars Masson MD 11/22/2014, 11:45 AM

## 2014-11-22 NOTE — Consult Note (Signed)
[facility] Consult   Reason for Consult:  Medication management for depression Referring Physician:  Dr. Tyrell Antonio Patient Identification: Taylor Padilla MRN:  [id number] Principal Diagnosis: SOB (shortness of breath) Diagnosis:   Patient Active Problem List   Diagnosis Date Noted  . Fever [R50.9]   . Acute diastolic heart failure, NYHA class 1 [I50.31] 11/19/2014  . Acute on chronic diastolic heart failure [Z30.07]   . Coronary artery disease involving native coronary artery of native heart without angina pectoris [I25.10]   . CKD (chronic kidney disease), stage II [N18.2] 10/07/2014  . Hypothyroidism [E03.9] 10/07/2014  . Dyslipidemia [E78.5] 10/07/2014  . GERD (gastroesophageal reflux disease) [K21.9] 10/07/2014  . Acute respiratory failure with hypoxia [J96.01] 10/07/2014  . Sinus bradycardia [R00.1] 10/07/2014  . Anemia [D64.9] 10/07/2014  . Diabetes mellitus type II, uncontrolled [E11.65]   . Hypertension [I10]   . CAD (coronary artery disease) [I25.10]     Total Time spent with patient: 45 minutes  Subjective:   Taylor Padilla is a 69 y.o. female patient admitted with acute respiratory failure. Pt interviewed, chart reviewed. Pt reports her mood has not been good for the last few days, because she wants to get out of the hospital. Pt is visually impaired. Normal energy/concentration. Sleep is fair, but she wakes up a few times at night. Appetite fair. Pt denies SI/HI/AVH. Abilify was started in hospital 4 days ago for depression, and pt is tolerating well. Pt has been taking zoloft for many years, and it had helped for her mood. Pt reports her sister was shot/killed in 1995, so pt became somewhat sad and was started on zoloft by her PCP at that time. Pt denies a h/o severe depression. Pt reports occasional anxiety. No history of trauma/mania/psychosis.  Pt has never seen a psychiatrist or therapist. No history of suicide attempts or psychiatric hospitalizations.  Pt does not want to see a psychiatrist or therapist, because she does not think it will be beneficial.   Pt wants to f/u with a new PCP for her abilify and zoloft, but she needs to find a new PCP in [place] (since she just moved here in Jan 2016). Pt currently lives in an assisted living facility. Her brother and sister-in-law live 3 blocks away from her. She does not want this MD to talk to her brother, because she did not want to involve him in this. No children. Pt's husband died in 2014-08-29.  HPI:  See above HPI Elements:   Location:  depression. Quality:  mild. Severity:  mild. Timing:  chronic. Duration:  years. Context:  husband died in 08/29/2014.  Past Medical History:  Past Medical History  Diagnosis Date  . Hypertension   . CAD (coronary artery disease)   . 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 Macular edema     hx  . Glaucoma, both eyes   . High cholesterol   . CHF (congestive heart failure)   . Chronic kidney disease (CKD), stage II (mild)     Taylor Padilla 10/07/2014  . Old myocardial infarct     "I was told I've had one; don't know when" (10/08/2014)  . Pneumonia 1990's X 1  . Hypothyroidism   . Type II diabetes mellitus   . Anemia   . History of blood transfusion 2003    "related to OHS"  . Stroke     "I've been told I've had one; I don't remember having any reaction from it at all"; denies residual on  10/08/2014    Past Surgical History  Procedure Laterality Date  . 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 Cataract extraction w/ intraocular lens  implant, bilateral Bilateral    Family History:  Family History  Problem Relation Age of Onset  . CAD Neg Hx    Social History:  History  Alcohol Use No     History  Drug Use No    History   Social History  . Marital Status: Widowed    Spouse Name: N/A  . Number of Children: N/A  . Years of Education: N/A   Social History Main Topics  . Smoking status: Never Smoker   . Smokeless  tobacco: Never Used  . Alcohol Use: No  . Drug Use: No  . Sexual Activity: No   Other Topics Concern  . None   Social History Narrative   Live in [facility] [place] living facility, moved from [place] in Dec 2015   Additional Social History:                          Allergies:   Allergies  Allergen Reactions  . Epinephrine Other (See Comments)    Patient says she is not allergic to epi, but that it makes her jittery.  WILL RECEIVE IF NEEDED IN AN EMERGENCY.    Labs:  Results for orders placed or performed during the hospital encounter of 11/19/14 (from the past 48 hour(s))  Glucose, capillary     Status: Abnormal   Collection Time: 11/20/14  9:10 PM  Result Value Ref Range   Glucose-Capillary 68 (L) 70 - 99 mg/dL  Glucose, capillary     Status: None   Collection Time: 11/20/14  9:59 PM  Result Value Ref Range   Glucose-Capillary 88 70 - 99 mg/dL  TSH     Status: Abnormal   Collection Time: 11/21/14  3:50 AM  Result Value Ref Range   TSH 9.575 (H) 0.350 - 4.500 uIU/mL  Basic metabolic panel     Status: Abnormal   Collection Time: 11/21/14  3:58 AM  Result Value Ref Range   Sodium 139 135 - 145 mmol/L   Potassium 4.0 3.5 - 5.1 mmol/L   Chloride 103 96 - 112 mmol/L   CO2 27 19 - 32 mmol/L   Glucose, Bld 107 (H) 70 - 99 mg/dL   BUN 28 (H) 6 - 23 mg/dL   Creatinine, Ser 2.03 (H) 0.50 - 1.10 mg/dL   Calcium 8.7 8.4 - 10.5 mg/dL   GFR calc non Af Amer 24 (L) >90 mL/min   GFR calc Af Amer 27 (L) >90 mL/min    Comment: (NOTE) The eGFR has been calculated using the CKD EPI equation. This calculation has not been validated in all clinical situations. eGFR's persistently <90 mL/min signify possible Chronic Kidney Disease.    Anion gap 9 5 - 15  CBC     Status: Abnormal   Collection Time: 11/21/14  3:58 AM  Result Value Ref Range   WBC 6.8 4.0 - 10.5 K/uL   RBC 3.34 (L) 3.87 - 5.11 MIL/uL   Hemoglobin 9.3 (L) 12.0 - 15.0 g/dL   HCT 30.1 (L) 36.0 - 46.0 %    MCV 90.1 78.0 - 100.0 fL   MCH 27.8 26.0 - 34.0 pg   MCHC 30.9 30.0 - 36.0 g/dL   RDW 16.1 (H) 11.5 - 15.5 %   Platelets  298 150 - 400 K/uL  Glucose, capillary     Status: Abnormal   Collection Time: 11/21/14  6:21 AM  Result Value Ref Range   Glucose-Capillary 116 (H) 70 - 99 mg/dL  Glucose, capillary     Status: Abnormal   Collection Time: 11/21/14 12:04 PM  Result Value Ref Range   Glucose-Capillary 146 (H) 70 - 99 mg/dL   Comment 1 Notify RN    Comment 2 Document in Chart   Glucose, capillary     Status: Abnormal   Collection Time: 11/21/14  5:02 PM  Result Value Ref Range   Glucose-Capillary 159 (H) 70 - 99 mg/dL   Comment 1 Notify RN    Comment 2 Document in Chart   Glucose, capillary     Status: None   Collection Time: 11/21/14  9:06 PM  Result Value Ref Range   Glucose-Capillary 72 70 - 99 mg/dL  Urinalysis, Routine w reflex microscopic     Status: Abnormal   Collection Time: 11/22/14  1:37 AM  Result Value Ref Range   Color, Urine YELLOW YELLOW   APPearance CLEAR CLEAR   Specific Gravity, Urine 1.010 1.005 - 1.030   pH 6.0 5.0 - 8.0   Glucose, UA NEGATIVE NEGATIVE mg/dL   Hgb urine dipstick NEGATIVE NEGATIVE   Bilirubin Urine NEGATIVE NEGATIVE   Ketones, ur NEGATIVE NEGATIVE mg/dL   Protein, ur 100 (A) NEGATIVE mg/dL   Urobilinogen, UA 0.2 0.0 - 1.0 mg/dL   Nitrite NEGATIVE NEGATIVE   Leukocytes, UA NEGATIVE NEGATIVE  Urine microscopic-add on     Status: None   Collection Time: 11/22/14  1:37 AM  Result Value Ref Range   Squamous Epithelial / LPF RARE RARE   WBC, UA 0-2 <3 WBC/hpf   RBC / HPF 0-2 <3 RBC/hpf   Bacteria, UA RARE RARE  Glucose, capillary     Status: Abnormal   Collection Time: 11/22/14  6:09 AM  Result Value Ref Range   Glucose-Capillary 123 (H) 70 - 99 mg/dL  Basic metabolic panel     Status: Abnormal   Collection Time: 11/22/14  6:34 AM  Result Value Ref Range   Sodium 134 (L) 135 - 145 mmol/L   Potassium 3.5 3.5 - 5.1 mmol/L    Chloride 95 (L) 96 - 112 mmol/L   CO2 30 19 - 32 mmol/L   Glucose, Bld 137 (H) 70 - 99 mg/dL   BUN 30 (H) 6 - 23 mg/dL   Creatinine, Ser 2.29 (H) 0.50 - 1.10 mg/dL   Calcium 8.6 8.4 - 10.5 mg/dL   GFR calc non Af Amer 20 (L) >90 mL/min   GFR calc Af Amer 24 (L) >90 mL/min    Comment: (NOTE) The eGFR has been calculated using the CKD EPI equation. This calculation has not been validated in all clinical situations. eGFR's persistently <90 mL/min signify possible Chronic Kidney Disease.    Anion gap 9 5 - 15  Glucose, capillary     Status: Abnormal   Collection Time: 11/22/14  8:01 AM  Result Value Ref Range   Glucose-Capillary 145 (H) 70 - 99 mg/dL  Glucose, capillary     Status: Abnormal   Collection Time: 11/22/14 11:13 AM  Result Value Ref Range   Glucose-Capillary 123 (H) 70 - 99 mg/dL  Glucose, capillary     Status: Abnormal   Collection Time: 11/22/14  2:28 PM  Result Value Ref Range   Glucose-Capillary 160 (H) 70 - 99 mg/dL  Glucose,  capillary     Status: Abnormal   Collection Time: 11/22/14  4:33 PM  Result Value Ref Range   Glucose-Capillary 155 (H) 70 - 99 mg/dL    Vitals: Blood pressure 160/46, pulse 53, temperature 98.8 F (37.1 C), temperature source Oral, resp. rate 18, height _0  (1.549 m), weight 67.949 kg (149 lb 12.8 oz), SpO2 95 %.  Risk to Self: Is patient at risk for suicide?: No Risk to Others:   Prior Inpatient Therapy:   Prior Outpatient Therapy:    Current Facility-Administered Medications  Medication Dose Route Frequency Provider Last Rate Last Dose  . 0.9 %  sodium chloride infusion   Intravenous Once Thurnell Lose, MD      . 0.9 %  sodium chloride infusion  250 mL Intravenous PRN Samella Parr, NP      . acetaminophen (TYLENOL) tablet 650 mg  650 mg Oral Q4H PRN Ritta Slot, NP   650 mg at 11/22/14 0143  . amLODipine (NORVASC) tablet 10 mg  10 mg Oral Daily Samella Parr, NP   10 mg at 11/22/14 0951  . ARIPiprazole (ABILIFY) tablet 5  mg  5 mg Oral Daily Samella Parr, NP   5 mg at 11/22/14 0951  . aspirin tablet 325 mg  325 mg Oral Daily Samella Parr, NP   325 mg at 11/22/14 0951  . atorvastatin (LIPITOR) tablet 80 mg  80 mg Oral QHS Samella Parr, NP   80 mg at 11/21/14 2047  . Chlorhexidine Gluconate Cloth 2 % PADS 6 each  6 each Topical Q0600 Elmarie Shiley, MD   6 each at 11/22/14 0610  . clopidogrel (PLAVIX) tablet 75 mg  75 mg Oral Daily Samella Parr, NP   75 mg at 11/22/14 0950  . dorzolamide-timolol (COSOPT) 22.3-6.8 MG/ML ophthalmic solution 1 drop  1 drop Both Eyes Q12H Samella Parr, NP   1 drop at 11/22/14 [phone]  . doxazosin (CARDURA) tablet 4 mg  4 mg Oral Daily Dorothy Spark, MD   4 mg at 11/22/14 1435  . enoxaparin (LOVENOX) injection 30 mg  30 mg Subcutaneous Q24H Samella Parr, NP   30 mg at 11/21/14 1855  . famotidine (PEPCID) tablet 20 mg  20 mg Oral QHS Skeet Simmer, RPH   20 mg at 11/21/14 2047  . ferrous sulfate tablet 325 mg  325 mg Oral BID WC Belkys A Regalado, MD   325 mg at 11/22/14 0608  . fluticasone (FLONASE) 50 MCG/ACT nasal spray 1 spray  1 spray Each Nare Daily Ritta Slot, NP   1 spray at 11/22/14 [phone]  . furosemide (LASIX) injection 60 mg  60 mg Intravenous BID Dorothy Spark, MD      . hydrALAZINE (APRESOLINE) tablet 100 mg  100 mg Oral 3 times per day Samella Parr, NP   100 mg at 11/22/14 1435  . insulin aspart (novoLOG) injection 0-15 Units  0-15 Units Subcutaneous TID WC Samella Parr, NP   3 Units at 11/21/14 1854  . insulin aspart (novoLOG) injection 0-5 Units  0-5 Units Subcutaneous QHS Samella Parr, NP   0 Units at 11/19/14 2200  . isosorbide dinitrate (ISORDIL) tablet 20 mg  20 mg Oral TID Samella Parr, NP   20 mg at 11/22/14 0951  . [START ON 11/23/2014] levofloxacin (LEVAQUIN) IVPB 500 mg  500 mg Intravenous Q48H Otilio Miu, RPH      .  levothyroxine (SYNTHROID, LEVOTHROID) tablet 100 mcg  100 mcg Oral QAC breakfast Samella Parr, NP   100  mcg at 11/22/14 0950  . mupirocin ointment (BACTROBAN) 2 % 1 application  1 application Nasal BID Elmarie Shiley, MD   1 application at 36/64/40 [phone]  . nitroGLYCERIN (NITROSTAT) SL tablet 0.4 mg  0.4 mg Sublingual Q5 min PRN Samella Parr, NP      . ondansetron [facility]) injection 4 mg  4 mg Intravenous Q6H PRN Samella Parr, NP      . pantoprazole (PROTONIX) EC tablet 40 mg  40 mg Oral Daily Samella Parr, NP   40 mg at 11/22/14 0950  . polyvinyl alcohol (LIQUIFILM TEARS) 1.4 % ophthalmic solution 1 drop  1 drop Both Eyes BID Skeet Simmer, RPH   1 drop at 11/22/14 2595  . prednisoLONE acetate (PRED FORTE) 1 % ophthalmic suspension 1 drop  1 drop Left Eye QID Samella Parr, NP   1 drop at 11/22/14 1436  . sertraline (ZOLOFT) tablet 200 mg  200 mg Oral QHS Samella Parr, NP   200 mg at 11/21/14 2047  . sodium chloride 0.9 % injection 3 mL  3 mL Intravenous Q12H Samella Parr, NP   3 mL at 11/22/14 0957  . sodium chloride 0.9 % injection 3 mL  3 mL Intravenous PRN Samella Parr, NP      . traMADol Veatrice Bourbon) tablet 50 mg  50 mg Oral Q6H PRN Samella Parr, NP   50 mg at 11/22/14 1117    Musculoskeletal: Strength & Muscle Tone: decreased Gait & Station: unable to assess Patient leans: N/A  Psychiatric Specialty Exam: Physical Exam  ROS  Blood pressure 160/46, pulse 53, temperature 98.8 F (37.1 C), temperature source Oral, resp. rate 18, height _0  (1.549 m), weight 67.949 kg (149 lb 12.8 oz), SpO2 95 %.Body mass index is 28.32 kg/(m^2).  General Appearance: Casual and Fairly Groomed  Eye Contact::  Absent  Speech:  Normal Rate  Volume:  Normal  Mood:  Irritable  Affect:  Appropriate and Constricted  Thought Process:  Coherent and Goal Directed  Orientation:  Full (Time, Place, and Person)  Thought Content:  Negative  Suicidal Thoughts:  No  Homicidal Thoughts:  No  Memory:  Immediate;   Fair Recent;   Fair Remote;   Fair  Judgement:  Fair  Insight:  Fair   Psychomotor Activity:  Normal  Concentration:  Fair  Recall:  [employer] of Knowledge:Fair  Language: Fair  Akathisia:  Negative  Handed:  Right  AIMS (if indicated):     Assets:  Communication Skills Desire for Improvement Financial Resources/Insurance Housing Social Support  ADL's:  Impaired  Cognition: WNL  Sleep:      Medical Decision Making: Established Problem, Stable/Improving (1), Review of Psycho-Social Stressors (1) and Review of Medication Regimen & Side Effects (2)  Treatment Plan Summary: Plan : pt may continue current medication regimen of zoloft 200 mg QHS and abilify 5 mg daily, since pt is tolerating meds. Abilify was started 4 days ago, so will need to await response in 2-3 weeks. However, pt does not plan to continue the abilify after leaving the hospital. Pt wants to only f/u with a new PCP in [place] to continue her abilify and zoloft, because she does not feel she needs to see a psychiatrist or therapist.  Plan:  No evidence of imminent risk to self or others at present.  Patient does not meet criteria for psychiatric inpatient admission. Supportive therapy provided about ongoing stressors. Discussed crisis plan, support from social network, calling 911, coming to the Emergency Department, and calling Suicide Hotline. Disposition: Pt may be discharged to ALF, once medically cleared. Pt wants to f/u with a new PCP in [place] to continue her psych meds.  Dereck Leep 11/22/2014 5:17 PM

## 2014-11-23 DIAGNOSIS — B9561 Methicillin susceptible Staphylococcus aureus infection as the cause of diseases classified elsewhere: Secondary | ICD-10-CM

## 2014-11-23 DIAGNOSIS — R7881 Bacteremia: Secondary | ICD-10-CM

## 2014-11-23 LAB — TYPE AND SCREEN
ABO/RH(D): O POS
ANTIBODY SCREEN: POSITIVE
DAT, IGG: NEGATIVE
DONOR AG TYPE: NEGATIVE
Donor AG Type: NEGATIVE
Donor AG Type: NEGATIVE
UNIT DIVISION: 0
Unit division: 0
Unit division: 0

## 2014-11-23 LAB — URINE CULTURE
Colony Count: NO GROWTH
Culture: NO GROWTH

## 2014-11-23 LAB — CBC
HEMATOCRIT: 28.9 % — AB (ref 36.0–46.0)
HEMOGLOBIN: 9.2 g/dL — AB (ref 12.0–15.0)
MCH: 28.1 pg (ref 26.0–34.0)
MCHC: 31.8 g/dL (ref 30.0–36.0)
MCV: 88.4 fL (ref 78.0–100.0)
Platelets: 211 10*3/uL (ref 150–400)
RBC: 3.27 MIL/uL — ABNORMAL LOW (ref 3.87–5.11)
RDW: 16.5 % — ABNORMAL HIGH (ref 11.5–15.5)
WBC: 8.6 10*3/uL (ref 4.0–10.5)

## 2014-11-23 LAB — BASIC METABOLIC PANEL
ANION GAP: 11 (ref 5–15)
BUN: 38 mg/dL — ABNORMAL HIGH (ref 6–23)
CHLORIDE: 93 mmol/L — AB (ref 96–112)
CO2: 27 mmol/L (ref 19–32)
Calcium: 8.3 mg/dL — ABNORMAL LOW (ref 8.4–10.5)
Creatinine, Ser: 2.8 mg/dL — ABNORMAL HIGH (ref 0.50–1.10)
GFR calc Af Amer: 19 mL/min — ABNORMAL LOW (ref >90)
GFR calc non Af Amer: 16 mL/min — ABNORMAL LOW (ref >90)
GLUCOSE: 126 mg/dL — AB (ref 70–99)
Potassium: 3.7 mmol/L (ref 3.5–5.1)
SODIUM: 131 mmol/L — AB (ref 135–145)

## 2014-11-23 LAB — GLUCOSE, CAPILLARY
GLUCOSE-CAPILLARY: 128 mg/dL — AB (ref 70–99)
Glucose-Capillary: 165 mg/dL — ABNORMAL HIGH (ref 70–99)
Glucose-Capillary: 113 mg/dL — ABNORMAL HIGH (ref 70–99)
Glucose-Capillary: 152 mg/dL — ABNORMAL HIGH (ref 70–99)

## 2014-11-23 MED ORDER — LORAZEPAM 1 MG PO TABS
1.0000 mg | ORAL_TABLET | Freq: Once | ORAL | Status: DC
Start: 2014-11-23 — End: 2014-11-26
  Filled 2014-11-23: qty 1

## 2014-11-23 MED ORDER — CEFAZOLIN SODIUM-DEXTROSE 2-3 GM-% IV SOLR
2.0000 g | Freq: Two times a day (BID) | INTRAVENOUS | Status: DC
  Administered 2014-11-23: 2 g via INTRAVENOUS
  Filled 2014-11-23 (×5): qty 50

## 2014-11-23 MED ORDER — VANCOMYCIN HCL IN DEXTROSE 750-5 MG/150ML-% IV SOLN
750.0000 mg | INTRAVENOUS | Status: DC
  Administered 2014-11-23 – 2014-11-24 (×2): 750 mg via INTRAVENOUS
  Filled 2014-11-23 (×2): qty 150

## 2014-11-23 NOTE — Plan of Care (Signed)
Problem: Phase I Progression Outcomes Goal: EF % per last Echo/documented,Core Reminder form on chart Outcome: Completed/Met Date Met:  11/23/14 EF 60-65%(10-08-14)

## 2014-11-23 NOTE — Care Management Note (Signed)
    Page 1 of 2   11/26/2014     12:01:29 PM CARE MANAGEMENT NOTE 11/26/2014  Patient:  Taylor Padilla,Taylor Padilla   Account Number:  [id number]  Date Initiated:  11/23/2014  Documentation initiated by:  Harnoor Reta  Subjective/Objective Assessment:   Pt adm on 11/19/14 with CHF, strep bacteremia.  PTA, [facility].     Action/Plan:   CSW consulted to facilitate return to ALF when medically stable   Anticipated DC Date:  11/26/2014   Anticipated DC Plan:  ASSISTED LIVING / REST HOME  In-house referral  Clinical Social Worker      DC Associate Professorlanning Services  CM consult  Medication Assistance      [facility]   Choice offered to / List presented to:  C-1 Patient        HH arranged  HH-1 RN  HH-10 DISEASE MANAGEMENT      HH agency  [facility]   Status of service:  Completed, signed off Medicare Important Message given?  YES (If response is "NO", the following Medicare IM given date fields will be blank) Date Medicare IM given:  11/23/2014 Medicare IM given by:  Roel Douthat Date Additional Medicare IM given:  11/26/2014 Additional Medicare IM given by:  Min Tunnell  Discharge Disposition:  ASSISTED LIVING  Per UR Regulation:  Reviewed for med. necessity/level of care/duration of stay  If discussed at Long Length of Stay Meetings, dates discussed:   11/26/2014    Comments:  11/26/14 Sidney AceJulie Delitha Elms, RN, BSN [phone] Checked with pt on cost of Linezolid...pt states she does not pay for meds herself; cost comes out of her payment for living at the facility, and they provide meds for her. Advised CSW to ensure that med is on FL2; it will be on dc summary as well.  To assist with prevention of readmission, [facility]Marland Kitchen.[facility] will follow up with pt at the facility for close management of CHF.  11/25/14 Sidney AceJulie Aireanna Luellen, RN, BSN [phone]  CO PAY FOR LINEZOLID 600MG  PO Q12H X 9 DAYS  IS: TIER 4 DRUG 25% OF  COST.$100 MAX.   11/24/14 Sidney AceJulie Ludene Stokke, RN, BSN [phone] Pt may need IV abx at dc for extended amt of time due to strep bacteremia.  Need clarification from pharmacy or ID, if possible.  Pt resides at ALF presently, and will need to go to SNF, should IV antibiotics be needed.

## 2014-11-23 NOTE — Progress Notes (Signed)
[facility] PROGRESS NOTE  Taylor [name] ZOX:[id number] DOB: 10-03-1945 DOA: 11/19/2014 PCP: No PCP Per Patient  Assessment/Plan: Acute respiratory failure with hypoxia/acute on chronic diastolic heart failure grade 2/moderate pulmonary hypertension 48 mmHg -Received Lasix to 60 mg IV every 12 hours .  -2-D echocardiogram February 2016 with EF 60-65% and restrictive physiology consistent with diastolic dysfunction -Continue afterload reduction with hydralazine and Isordil -Angiotensin Convertin enzymes at 25 normal.  -Nutrition consulted for diet education.  -negative 5 L. -Weight: 73---71---69---67 -no ace due to renal failure.  -lasix on hold due to worsening renal function.   Fever:  Blood culture 2/2 growing gram positive cocci in cluster. , urine culture no growth to date.  Chest x ray no evidence of PNA.  Continue with Levaquin vancomycin   Sinus bradycardia -Hold Beta blocker.  -TSH: 9.5   Diabetes mellitus type II, uncontrolled -hold  Lantus due to hypoglycemia.  -hemoglobin A1c :  6.5 -Provide sliding scale insulin  Depression:  On Abilify, Zoloft  Psych consulted to assist with medications. No medications changes. Patients wants to follow up with PCP for this.    Hypertension -Continue medications above including Norvasc -started on Doxazosin 3-18, dose increase 3-19/    CKD (chronic kidney disease), stage II -Cr baseline at 1.9 -Follow labs with diuresis. -Cr increase to 2.8. Lasix on hold.    Anemia, iron deficiency anemia.  -Check anemia panel -S?P one unit PRBC.  -Check fecal occult blood -needs outpatient evaluation.    CAD -[facility] [place] [facility] -Appears to be asymptomatic -Defer potential Myoview stress testing to cardiology team   Hypothyroidism -TSH in February greater than 10 with a free T4 of 1.02 -Review of patient's medication reconciliation sheet has documentation that  patient not taking her Synthroid -Repeat TSH at 9.5  -Continue previously documented Synthroid 100 g daily   Dyslipidemia -Continue Lipitor   GERD  -Continue PPI and H2 blocker  Code Status: full code.  Family Communication: Care discussed with patient Disposition Plan: remain inpatient awaiting blood culture results. And improvement of renal function.    Consultants:  Cardiology  Procedures:  none  Antibiotics:  none  HPI/Subjective: She is feeling better, she was not crying when I saw her. Denies dyspnea.    Objective: Filed Vitals:   11/23/14 1033  BP: 143/46  Pulse:   Temp:   Resp:     Intake/Output Summary (Last 24 hours) at 11/23/14 1425 Last data filed at 11/23/14 0958  Gross per 24 hour  Intake    930 ml  Output      0 ml  Net    930 ml   Filed Weights   11/21/14 0514 11/22/14 0459 11/23/14 0605  Weight: 69.037 kg (152 lb 3.2 oz) 67.949 kg (149 lb 12.8 oz) 67.438 kg (148 lb 10.8 oz)    Exam:   General:  Alert in no distress, morbid obese.   Cardiovascular: S 1, S 2 RRR  Respiratory: Crackles bases.   Abdomen: BS present, obese NT  Musculoskeletal: no edema  Data Reviewed: Basic Metabolic Panel:  Recent Labs Lab 11/19/14 1215 11/20/14 0410 11/21/14 0358 11/22/14 0634 11/23/14 0619  NA 139 140 139 134* 131*  K 4.1 3.5 4.0 3.5 3.7  CL 104 106 103 95* 93*  CO2 25 27 27 30 27   GLUCOSE 113* 58* 107* 137* 126*  BUN 30* 30* 28* 30* 38*  CREATININE 2.03* 2.02* 2.03* 2.29* 2.80*  CALCIUM 8.8 9.0 8.7  8.6 8.3*   Liver Function Tests: No results for input(s): AST, ALT, ALKPHOS, BILITOT, PROT, ALBUMIN in the last 168 hours. No results for input(s): LIPASE, AMYLASE in the last 168 hours. No results for input(s): AMMONIA in the last 168 hours. CBC:  Recent Labs Lab 11/19/14 1215 11/20/14 0334 11/21/14 0358 11/23/14 0619  WBC 6.8  --  6.8 8.6  NEUTROABS 5.2  --   --   --   HGB 7.6* 9.6* 9.3* 9.2*  HCT 24.7* 30.5* 30.1* 28.9*   MCV 91.1  --  90.1 88.4  PLT 304  --  298 211   Cardiac Enzymes: No results for input(s): CKTOTAL, CKMB, CKMBINDEX, TROPONINI in the last 168 hours. BNP (last 3 results)  Recent Labs  10/07/14 1327 10/31/14 1645 11/19/14 1215  BNP 2584.7* 1931.3* 2140.8*    ProBNP (last 3 results) No results for input(s): PROBNP in the last 8760 hours.  CBG:  Recent Labs Lab 11/22/14 1428 11/22/14 1633 11/22/14 2121 11/23/14 0604 11/23/14 1100  GLUCAP 160* 155* 215* 128* 165*    Recent Results (from the past 240 hour(s))  Culture, blood (routine x 2)     Status: None (Preliminary result)   Collection Time: 11/21/14  9:30 PM  Result Value Ref Range Status   Specimen Description BLOOD RIGHT ARM  Final   Special Requests BOTTLES DRAWN AEROBIC AND ANAEROBIC  Final   Culture   Final    STAPHYLOCOCCUS AUREUS Note: RIFAMPIN AND GENTAMICIN SHOULD NOT BE USED AS SINGLE DRUGS FOR TREATMENT OF STAPH INFECTIONS. Note: Gram Stain Report Called to,Read Back By and Verified With: MIKE S RN  [phone] [facility] Performed at [facility]    Report Status PENDING  Incomplete  Culture, blood (routine x 2)     Status: None (Preliminary result)   Collection Time: 11/21/14  9:40 PM  Result Value Ref Range Status   Specimen Description BLOOD RIGHT HAND  Final   Special Requests BOTTLES DRAWN AEROBIC AND ANAEROBIC  Final   Culture   Final    GRAM POSITIVE COCCI IN CLUSTERS Note: Gram Stain Report Called to,Read Back By and Verified With: Lona Kettle RN on 11/23/14 at 02:30 by Christie Nottingham Performed at [facility]    Report Status PENDING  Incomplete  Culture, Urine     Status: None   Collection Time: 11/22/14  1:37 AM  Result Value Ref Range Status   Specimen Description URINE, RANDOM  Final   Special Requests NONE  Final   Colony Count NO GROWTH Performed at [facility]   Final   Culture NO GROWTH Performed at [employer] [facility]   Final   Report Status  11/23/2014 FINAL  Final     Studies: Dg Chest 2 View  11/22/2014   CLINICAL DATA:  Pt admitted 11/19/14 with recurrent CHF, uncontrolled HTN, bradycardia.  EXAM: CHEST  2 VIEW  COMPARISON:  11/20/2014 and 11/19/2014  FINDINGS: Interstitial thickening has resolved. There are only minimal residual effusions. No areas of lung consolidation.  Changes from CABG surgery are stable.  Mild stable cardiomegaly.  Lungs are mildly hyperexpanded.  IMPRESSION: Congestive heart failure appears resolved. There are minimal residual pleural effusions.   Electronically Signed   By: Amie Portland M.D.   On: 11/22/2014 11:49    Scheduled Meds: . sodium chloride   Intravenous Once  . amLODipine  10 mg Oral Daily  . ARIPiprazole  5 mg Oral Daily  . aspirin  325  mg Oral Daily  . atorvastatin  80 mg Oral QHS  . Chlorhexidine Gluconate Cloth  6 each Topical Q0600  . clopidogrel  75 mg Oral Daily  . dorzolamide-timolol  1 drop Both Eyes Q12H  . doxazosin  4 mg Oral Daily  . enoxaparin (LOVENOX) injection  30 mg Subcutaneous Q24H  . famotidine  20 mg Oral QHS  . ferrous sulfate  325 mg Oral BID WC  . fluticasone  1 spray Each Nare Daily  . furosemide  60 mg Intravenous BID  . hydrALAZINE  100 mg Oral 3 times per day  . insulin aspart  0-15 Units Subcutaneous TID WC  . insulin aspart  0-5 Units Subcutaneous QHS  . isosorbide dinitrate  20 mg Oral TID  . levofloxacin (LEVAQUIN) IV  500 mg Intravenous Q48H  . levothyroxine  100 mcg Oral QAC breakfast  . LORazepam  1 mg Oral Once  . mupirocin ointment  1 application Nasal BID  . pantoprazole  40 mg Oral Daily  . polyvinyl alcohol  1 drop Both Eyes BID  . prednisoLONE acetate  1 drop Left Eye QID  . sertraline  200 mg Oral QHS  . sodium chloride  3 mL Intravenous Q12H  . vancomycin  750 mg Intravenous Q24H   Continuous Infusions:   Active Problems:   Diabetes mellitus type II, uncontrolled   Hypertension   CAD (coronary artery disease)   CKD (chronic  kidney disease), stage II   Hypothyroidism   Dyslipidemia   GERD (gastroesophageal reflux disease)   Acute respiratory failure with hypoxia   Sinus bradycardia   Anemia   Acute on chronic diastolic heart failure   Acute diastolic heart failure, NYHA class 1   Fever    Time spent: 35 minutes.     Hartley Barefoot A  [facility] Pager [phone]. If 7PM-7AM, please contact night-coverage at [email or web address], password [facility] 11/23/2014, 2:25 PM  LOS: 4 days

## 2014-11-23 NOTE — Progress Notes (Signed)
Subjective:    Taylor Padilla is a 69 y.o. female with a history of GERD, HLD, HTN, DM, glaucoma/legal blidness, CKD, anemia, hypothyroidism, CAD [place] and chronic diastolic CHF with multiple recent readmissions who [facility] with [facility].   Patient was recently hospitalized from 2/3-2/6 because of HF exacerbation. She was new to our cardiology service and seen in consult by Dr. Eden EmmsNishan. 2D ECHO on 10/08/14 w/ EF 60-65%, no RWMAs, doppler parameters c/w restrictive physiology. PA pk pressure 48. She was successfully diuresed in the hospital and discharged on 80 mg Lasix daily. Her weight was 144lbs at discharge. Outpatient cardiology follow up was supposed to be arranged with nuclear stress testing in the office. However; she did not take her lasix as prescribed and was re-admitted with another CHF exacerbation from 2/27-3/01. She was discharged on 80mg  Lasix qd. Discharge weight 151lbs. However, patient said that she was only taking 80mg  alternating with 40mg  every other day. She was seen by the in-house PA at her assisted living facility last Wednesday and Lasix was increased to 80mg  qd due to increased SOB. However, she has continue to gain weight. She was readmitted 3/17 up 15lbs with weight 166 lbs. The advanced HF team was asked to assume care.   Over the weekend she continued to diurese with IV lasix. Overall her weight is down 18 pounds. Denies SOB. Bcx + for staph aureus  Creatinine 2.2>2.8    Objective:   Weight Range:  Vital Signs:   Temp:  [98.6 F (37 C)-98.8 F (37.1 C)] 98.6 F (37 C) (03/21 0605) Pulse Rate:  [53-62] 58 (03/21 0605) Resp:  [16-18] 18 (03/21 0605) BP: (160-175)/(46-59) 167/59 mmHg (03/21 0605) SpO2:  [95 %-98 %] 96 % (03/21 0605) Weight:  [148 lb 10.8 oz (67.438 kg)] 148 lb 10.8 oz (67.438 kg) (03/21 0605) Last BM Date: 11/21/14  Weight change: Filed Weights   11/21/14 0514 11/22/14 0459 11/23/14 0605    Weight: 152 lb 3.2 oz (69.037 kg) 149 lb 12.8 oz (67.949 kg) 148 lb 10.8 oz (67.438 kg)    Intake/Output:   Intake/Output Summary (Last 24 hours) at 11/23/14 0816 Last data filed at 11/23/14 0600  Gross per 24 hour  Intake    870 ml  Output    300 ml  Net    570 ml     Physical Exam: General:  Blind woman lying in bed. No resp difficulty HEENT: normal Neck: supple. JVP 5-6 . Carotids 2+ bilat; no bruits. No lymphadenopathy or thryomegaly appreciated. Cor: PMI nondisplaced. Regular brady  . No rubs, gallops or murmurs. Lungs: clear Abdomen: soft, nontender, nondistended. No hepatosplenomegaly. No bruits or masses. Good bowel sounds. Extremities: no cyanosis, clubbing, rash, no edema.  Neuro: alert & orientedx3, moves all 4 extremities w/o difficulty. Affect pleasant  Telemetry: SBrady 50s (reviewed personally)  Labs: Basic Metabolic Panel:  Recent Labs Lab 11/19/14 1215 11/20/14 0410 11/21/14 0358 11/22/14 0634 11/23/14 0619  NA 139 140 139 134* 131*  K 4.1 3.5 4.0 3.5 3.7  CL 104 106 103 95* 93*  CO2 25 27 27 30 27   GLUCOSE 113* 58* 107* 137* 126*  BUN 30* 30* 28* 30* 38*  CREATININE 2.03* 2.02* 2.03* 2.29* 2.80*  CALCIUM 8.8 9.0 8.7 8.6 8.3*    Recent Labs Lab 11/19/14 1215 11/20/14 0334 11/21/14 0358 11/23/14 0619  WBC 6.8  --  6.8 8.6  NEUTROABS 5.2  --   --   --  HGB 7.6* 9.6* 9.3* 9.2*  HCT 24.7* 30.5* 30.1* 28.9*  MCV 91.1  --  90.1 88.4  PLT 304  --  298 211   BNP (last 3 results)  Recent Labs  10/07/14 1327 10/31/14 1645 11/19/14 1215  BNP 2584.7* 1931.3* 2140.8*    ProBNP (last 3 results) No results for input(s): PROBNP in the last 8760 hours.    Other results:    Imaging: Dg Chest 2 View  11/22/2014   CLINICAL DATA:  Pt admitted 11/19/14 with recurrent CHF, uncontrolled HTN, bradycardia.  EXAM: CHEST  2 VIEW  COMPARISON:  11/20/2014 and 11/19/2014  FINDINGS: Interstitial thickening has resolved. There are only minimal residual  effusions. No areas of lung consolidation.  Changes from CABG surgery are stable.  Mild stable cardiomegaly.  Lungs are mildly hyperexpanded.  IMPRESSION: Congestive heart failure appears resolved. There are minimal residual pleural effusions.   Electronically Signed   By: Amie Portland M.D.   On: 11/22/2014 11:49     Medications:     Scheduled Medications: . sodium chloride   Intravenous Once  . amLODipine  10 mg Oral Daily  . ARIPiprazole  5 mg Oral Daily  . aspirin  325 mg Oral Daily  . atorvastatin  80 mg Oral QHS  . Chlorhexidine Gluconate Cloth  6 each Topical Q0600  . clopidogrel  75 mg Oral Daily  . dorzolamide-timolol  1 drop Both Eyes Q12H  . doxazosin  4 mg Oral Daily  . enoxaparin (LOVENOX) injection  30 mg Subcutaneous Q24H  . famotidine  20 mg Oral QHS  . ferrous sulfate  325 mg Oral BID WC  . fluticasone  1 spray Each Nare Daily  . furosemide  60 mg Intravenous BID  . hydrALAZINE  100 mg Oral 3 times per day  . insulin aspart  0-15 Units Subcutaneous TID WC  . insulin aspart  0-5 Units Subcutaneous QHS  . isosorbide dinitrate  20 mg Oral TID  . levofloxacin (LEVAQUIN) IV  500 mg Intravenous Q48H  . levothyroxine  100 mcg Oral QAC breakfast  . LORazepam  1 mg Oral Once  . mupirocin ointment  1 application Nasal BID  . pantoprazole  40 mg Oral Daily  . polyvinyl alcohol  1 drop Both Eyes BID  . prednisoLONE acetate  1 drop Left Eye QID  . sertraline  200 mg Oral QHS  . sodium chloride  3 mL Intravenous Q12H  . vancomycin  750 mg Intravenous Q24H    Infusions:    PRN Medications: sodium chloride, acetaminophen, nitroGLYCERIN, ondansetron (ZOFRAN) IV, sodium chloride, traMADol   Assessment:   1. A/c diastolic HF 2. CKD, stage 3 3. Legal blindness 4. Bradycardia - coreg stopped this admit 5. Severe HTN, uncontrolled 6. DM2 7. CAD s/p CABG 8. Staph aureus bacteremia   Plan/Discussion:   Volume status improved. Weight down 1 pound. Stop IV lasix with  creatinine bump noted 2.3>2.8. Marland Kitchen Weight down 18 pounds.    BP markedly elevated. Carvedilol stopped due to bradycardia. On Norvasc and hydralazine maxed. Doxazosin started on Friday,  will increase to 4 mg po daily. No Ace with CKD.   Anemia has improved. Length of Stay: 4 CLEGG,AMY NP-C 11/23/2014, 8:16 AM  Patient seen and examined with Tonye Becket, NP. We discussed all aspects of the encounter. I agree with the assessment and plan as stated above.   Volume status looks much better. She is now dry. Agree with holding diuretics given bump in creatinine.  Can restart in a day or two. ID has seen for staph bacteremia. Will repeat TTE. Agree with increasing doxazosin - need to go slowly especially with bacteremia and risk of sepsis.  Truman Hayward 6:39 PM

## 2014-11-23 NOTE — Consult Note (Signed)
[facility]    Date of Admission:  11/19/2014           Day 2 vancomycin        Day 2 levofloxacin       Reason for Consult: Automatic consultation for staph aureus bacteremia     Principal Problem:   Staphylococcus aureus bacteremia Active Problems:   Diabetes mellitus type II, uncontrolled   Hypertension   CAD (coronary artery disease)   CKD (chronic kidney disease), stage II   Hypothyroidism   Dyslipidemia   GERD (gastroesophageal reflux disease)   Acute respiratory failure with hypoxia   Sinus bradycardia   Anemia   Acute on chronic diastolic heart failure   Acute diastolic heart failure, NYHA class 1   Fever   . sodium chloride   Intravenous Once  . amLODipine  10 mg Oral Daily  . ARIPiprazole  5 mg Oral Daily  . aspirin  325 mg Oral Daily  . atorvastatin  80 mg Oral QHS  . Chlorhexidine Gluconate Cloth  6 each Topical Q0600  . clopidogrel  75 mg Oral Daily  . dorzolamide-timolol  1 drop Both Eyes Q12H  . doxazosin  4 mg Oral Daily  . enoxaparin (LOVENOX) injection  30 mg Subcutaneous Q24H  . famotidine  20 mg Oral QHS  . ferrous sulfate  325 mg Oral BID WC  . fluticasone  1 spray Each Nare Daily  . furosemide  60 mg Intravenous BID  . hydrALAZINE  100 mg Oral 3 times per day  . insulin aspart  0-15 Units Subcutaneous TID WC  . insulin aspart  0-5 Units Subcutaneous QHS  . isosorbide dinitrate  20 mg Oral TID  . levofloxacin (LEVAQUIN) IV  500 mg Intravenous Q48H  . levothyroxine  100 mcg Oral QAC breakfast  . LORazepam  1 mg Oral Once  . mupirocin ointment  1 application Nasal BID  . pantoprazole  40 mg Oral Daily  . polyvinyl alcohol  1 drop Both Eyes BID  . prednisoLONE acetate  1 drop Left Eye QID  . sertraline  200 mg Oral QHS  . sodium chloride  3 mL Intravenous Q12H  . vancomycin  750 mg Intravenous Q24H    Recommendations: 1. Continue vancomycin 2. Change levofloxacin to cefazolin pending final  susceptibilities 3. Repeat blood cultures  4. Recommend transthoracic echocardiogram 5. Encouraged her to allow IV change  Assessment:  Ms. Ashmore developed high fever 48 hours after admission and has staph aureus bacteremia. The most obvious source for her infection is her left arm IV. She is very adamant about not having it changed. I will continue vancomycin for optimal coverage of MRSA and change levofloxacin to cefazolin for optimal coverage of MSSA. I will obtain a repeat blood cultures. I would recommend TTE.    HPI: Taylor Padilla is a 69 y.o. female  who was admitted on 11/19/2014 with recurrent acute on chronic diastolic heart failure. She had been having weight gain, increasing lower extremity edema and shortness of breath. She is not aware of any fever at home and did not have any chills or sweats. 2 days after admission she had a fever spike and blood cultures done at that time have grown staph aureus. She has not had any further fever.   Review of Systems: Constitutional: positive for fevers, negative for chills and sweats Eyes: legal blindness Ears, nose, mouth, throat, and face: negative Respiratory: positive  for dyspnea on exertion, negative for cough and sputum Cardiovascular: positive for dyspnea, negative for exertional chest pressure/discomfort Gastrointestinal: negative Genitourinary:negative  Past Medical History  Diagnosis Date  . Hypertension   . CAD (coronary artery disease)   . 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 Macular edema     hx  . Glaucoma, both eyes   . High cholesterol   . CHF (congestive heart failure)   . Chronic kidney disease (CKD), stage II (mild)     [name]/notes 10/07/2014  . Old myocardial infarct     "I was told I've had one; don't know when" (10/08/2014)  . Pneumonia 1990's X 1  . Hypothyroidism   . Type II diabetes mellitus   . Anemia   . History of blood transfusion 2003    "related to [facility]HS"  . Stroke     "I've been told I've had one; I  don't remember having any reaction from it at all"; denies residual on 10/08/2014    History  Substance Use Topics  . Smoking status: Never Smoker   . Smokeless tobacco: Never Used  . Alcohol Use: No    Family History  Problem Relation Age of Onset  . CAD Neg Hx    Allergies  Allergen Reactions  . Epinephrine Other (See Comments)    Patient says she is not allergic to epi, but that it makes her jittery.  WILL RECEIVE IF NEEDED IN AN EMERGENCY.    OBJECTIVE: Blood pressure 126/47, pulse 58, temperature 98.2 F (36.8 C), temperature source Oral, resp. rate 18, height 5\' 1"  (1.549 m), weight 148 lb 10.8 oz (67.438 kg), SpO2 98 %. General:  She is comfortable laying in bed Skin:  No rash, splinter or conjunctival hemorrhages. No skin breakdown. IV and left antecubital fossa placed on admission. Lungs:  Clear Cor:  Regular S1 and S2 with no murmur heard but heart sounds very distant Abdomen:  Soft and nontender joints and extremities: No acute abnormalities  Lab Results Lab Results  Component Value Date   WBC 8.6 11/23/2014   HGB 9.2* 11/23/2014   HCT 28.9* 11/23/2014   MCV 88.4 11/23/2014   PLT 211 11/23/2014    Lab Results  Component Value Date   CREATININE 2.80* 11/23/2014   BUN 38* 11/23/2014   NA 131* 11/23/2014   K 3.7 11/23/2014   CL 93* 11/23/2014   CO2 27 11/23/2014    Lab Results  Component Value Date   ALT 15 11/01/2014   AST 17 11/01/2014   ALKPHOS 67 11/01/2014   BILITOT 0.6 11/01/2014     Microbiology: Recent Results (from the past 240 hour(s))  Culture, blood (routine x 2)     Status: None (Preliminary result)   Collection Time: 11/21/14  9:30 PM  Result Value Ref Range Status   Specimen Description BLOOD RIGHT ARM  Final   Special Requests BOTTLES DRAWN AEROBIC AND ANAEROBIC 5ML  Final   Culture   Final    STAPHYLOCOCCUS AUREUS Note: RIFAMPIN AND GENTAMICIN SHOULD NOT BE USED AS SINGLE DRUGS FOR TREATMENT OF STAPH INFECTIONS. Note: Gram Stain  Report Called to,Read Back By and Verified With: MIKE S RN @955PM  [phone] [facility] Performed at [facility]    Report Status PENDING  Incomplete  Culture, blood (routine x 2)     Status: None (Preliminary result)   Collection Time: 11/21/14  9:40 PM  Result Value Ref Range Status   Specimen Description BLOOD RIGHT HAND  Final  Special Requests BOTTLES DRAWN AEROBIC AND ANAEROBIC  Final   Culture   Final    GRAM POSITIVE COCCI IN CLUSTERS Note: Gram Stain Report Called to,Read Back By and Verified With: Lona Kettle RN on 11/23/14 at 02:30 by Christie Nottingham Performed at [facility]    Report Status PENDING  Incomplete  Culture, Urine     Status: None   Collection Time: 11/22/14  1:37 AM  Result Value Ref Range Status   Specimen Description URINE, RANDOM  Final   Special Requests NONE  Final   Colony Count NO GROWTH Performed at [facility]   Final   Culture NO GROWTH Performed at [employer] [facility]   Final   Report Status 11/23/2014 FINAL  Final    Cliffton Asters, MD [facility] [phone] pager   [phone] cell 11/23/2014, 2:28 PM

## 2014-11-23 NOTE — Progress Notes (Signed)
ANTIBIOTIC CONSULT NOTE - INITIAL  Pharmacy Consult for Vancomycin  Indication: Positive blood culture  Allergies  Allergen Reactions  . Epinephrine Other (See Comments)    Patient says she is not allergic to epi, but that it makes her jittery.  WILL RECEIVE IF NEEDED IN AN EMERGENCY.    Patient Measurements: Height: 5\' 1"  (154.9 cm) Weight: 149 lb 12.8 oz (67.949 kg) IBW/kg (Calculated) : 47.8  Vital Signs: Temp: 98.6 F (37 C) (03/20 2122) Temp Source: Oral (03/20 2122) BP: 174/50 mmHg (03/20 2122) Pulse Rate: 62 (03/20 2122)  Labs:  Recent Labs  11/20/14 0334 11/20/14 0410 11/21/14 0358 11/22/14 0634  WBC  --   --  6.8  --   HGB 9.6*  --  9.3*  --   PLT  --   --  298  --   CREATININE  --  2.02* 2.03* 2.29*   Estimated Creatinine Clearance: 20.1 mL/min (by C-G formula based on Cr of 2.29).   Microbiology: Recent Results (from the past 720 hour(s))  MRSA PCR Screening     Status: Abnormal   Collection Time: 11/01/14 12:02 AM  Result Value Ref Range Status   MRSA by PCR POSITIVE (A) NEGATIVE Final    Comment:        The GeneXpert MRSA Assay (FDA approved for NASAL specimens only), is one component of a comprehensive MRSA colonization surveillance program. It is not intended to diagnose MRSA infection nor to guide or monitor treatment for MRSA infections. RESULT CALLED TO, READ BACK BY AND VERIFIED WITH: RASUL,G RN 0301 11/01/14 MITCHELL,L   Culture, blood (routine x 2)     Status: None (Preliminary result)   Collection Time: 11/21/14  9:30 PM  Result Value Ref Range Status   Specimen Description BLOOD RIGHT ARM  Final   Special Requests BOTTLES DRAWN AEROBIC AND ANAEROBIC 5ML  Final   Culture   Final    GRAM POSITIVE COCCI IN CLUSTERS Note: Gram Stain Report Called to,Read Back By and Verified With: MIKE S RN @955PM  [id number] VINCJ Performed at [facility]    Report Status PENDING  Incomplete    Medical History: Past Medical History   Diagnosis Date  . Hypertension   . CAD (coronary artery disease)   . 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 Macular edema     hx  . Glaucoma, both eyes   . High cholesterol   . CHF (congestive heart failure)   . Chronic kidney disease (CKD), stage II (mild)     [name]/notes 10/07/2014  . Old myocardial infarct     "I was told I've had one; don't know when" (10/08/2014)  . Pneumonia 1990's X 1  . Hypothyroidism   . Type II diabetes mellitus   . Anemia   . History of blood transfusion 2003    "related to OHS"  . Stroke     "I've been told I've had one; I don't remember having any reaction from it at all"; denies residual on 10/08/2014   Assessment: Starting vancomycin for 1/2 positive blood cultures. Noted renal dysfunction.   Goal of Therapy:  Vancomycin trough level 15-20 mcg/ml  Plan:  -Vancomycin 750 mg IV q24h, may need dose decrease if renal function worsens (mild rise in Scr on 3/20) -Trend WBC, temp, renal function  -Drug levels as indicated   Abran DukeLedford, Aydan Phoenix 11/23/2014,12:50 AM

## 2014-11-23 NOTE — Progress Notes (Signed)
PT Cancellation Note  Patient Details Name: [name] MRN: [id number] DOB: September 04, 1946   Cancelled Treatment:    Reason Eval/Treat Not Completed: Patient declined, no reason specified;Other (comment) (Reports she has been up through the day but nursing didn't)confirm this.  Will retry tomorrow.   Ivar DrapeStout, Naysha Sholl E 11/23/2014, 5:20 PM   Samul Dadauth Rosina Cressler, PT MS Acute Rehab Dept. Number: [phone]

## 2014-11-23 NOTE — Clinical Social Work Note (Signed)
CSW has updated facility on patient to make them aware that she will not be discharging today. CSW will continue to follow for DC needs.   Roddie McBryant Aksh Swart MSW, [place] North[place]CSWA, [place], [phone]

## 2014-11-24 DIAGNOSIS — D649 Anemia, unspecified: Secondary | ICD-10-CM

## 2014-11-24 DIAGNOSIS — E039 Hypothyroidism, unspecified: Secondary | ICD-10-CM

## 2014-11-24 DIAGNOSIS — K219 Gastro-esophageal reflux disease without esophagitis: Secondary | ICD-10-CM

## 2014-11-24 DIAGNOSIS — E1165 Type 2 diabetes mellitus with hyperglycemia: Secondary | ICD-10-CM

## 2014-11-24 DIAGNOSIS — I129 Hypertensive chronic kidney disease with stage 1 through stage 4 chronic kidney disease, or unspecified chronic kidney disease: Secondary | ICD-10-CM

## 2014-11-24 LAB — GLUCOSE, CAPILLARY
Glucose-Capillary: 126 mg/dL — ABNORMAL HIGH (ref 70–99)
Glucose-Capillary: 158 mg/dL — ABNORMAL HIGH (ref 70–99)
Glucose-Capillary: 151 mg/dL — ABNORMAL HIGH (ref 70–99)
Glucose-Capillary: 162 mg/dL — ABNORMAL HIGH (ref 70–99)

## 2014-11-24 LAB — CULTURE, BLOOD (ROUTINE X 2)

## 2014-11-24 LAB — BASIC METABOLIC PANEL
Anion gap: 10 (ref 5–15)
BUN: 46 mg/dL — ABNORMAL HIGH (ref 6–23)
CALCIUM: 8.2 mg/dL — AB (ref 8.4–10.5)
CO2: 28 mmol/L (ref 19–32)
Chloride: 93 mmol/L — ABNORMAL LOW (ref 96–112)
Creatinine, Ser: 2.94 mg/dL — ABNORMAL HIGH (ref 0.50–1.10)
GFR, EST AFRICAN AMERICAN: 18 mL/min — AB (ref >90)
GFR, EST NON AFRICAN AMERICAN: 15 mL/min — AB (ref >90)
Glucose, Bld: 129 mg/dL — ABNORMAL HIGH (ref 70–99)
Potassium: 3.4 mmol/L — ABNORMAL LOW (ref 3.5–5.1)
Sodium: 131 mmol/L — ABNORMAL LOW (ref 135–145)

## 2014-11-24 LAB — RESPIRATORY VIRUS PANEL
Adenovirus: NEGATIVE
Influenza A: NEGATIVE
Influenza B: NEGATIVE
Metapneumovirus: NEGATIVE
Parainfluenza 1: NEGATIVE
Parainfluenza 2: NEGATIVE
Parainfluenza 3: NEGATIVE
Respiratory Syncytial Virus A: NEGATIVE
Respiratory Syncytial Virus B: NEGATIVE
Rhinovirus: NEGATIVE

## 2014-11-24 MED ORDER — VANCOMYCIN HCL IN DEXTROSE 1-5 GM/200ML-% IV SOLN
1000.0000 mg | INTRAVENOUS | Status: DC
  Filled 2014-11-24: qty 200

## 2014-11-24 MED ORDER — POTASSIUM CHLORIDE CRYS ER 20 MEQ PO TBCR
20.0000 meq | EXTENDED_RELEASE_TABLET | Freq: Once | ORAL | Status: AC
  Administered 2014-11-24: 20 meq via ORAL
  Filled 2014-11-24: qty 1

## 2014-11-24 NOTE — Progress Notes (Signed)
Patient ID: Taylor Padilla, female   DOB: 1945/09/11, 69 y.o.   MRN: [id number]         [facility]    Date of Admission:  11/19/2014           Day 3 vancomycin  Principal Problem:   Staphylococcus aureus bacteremia Active Problems:   Diabetes mellitus type II, uncontrolled   Hypertension   CAD (coronary artery disease)   CKD (chronic kidney disease), stage II   Hypothyroidism   Dyslipidemia   GERD (gastroesophageal reflux disease)   Acute respiratory failure with hypoxia   Sinus bradycardia   Anemia   Acute on chronic diastolic heart failure   Acute diastolic heart failure, NYHA class 1   Fever   . sodium chloride   Intravenous Once  . amLODipine  10 mg Oral Daily  . ARIPiprazole  5 mg Oral Daily  . aspirin  325 mg Oral Daily  . atorvastatin  80 mg Oral QHS  . Chlorhexidine Gluconate Cloth  6 each Topical Q0600  . clopidogrel  75 mg Oral Daily  . dorzolamide-timolol  1 drop Both Eyes Q12H  . doxazosin  4 mg Oral Daily  . enoxaparin (LOVENOX) injection  30 mg Subcutaneous Q24H  . famotidine  20 mg Oral QHS  . ferrous sulfate  325 mg Oral BID WC  . fluticasone  1 spray Each Nare Daily  . hydrALAZINE  100 mg Oral 3 times per day  . insulin aspart  0-15 Units Subcutaneous TID WC  . insulin aspart  0-5 Units Subcutaneous QHS  . isosorbide dinitrate  20 mg Oral TID  . levothyroxine  100 mcg Oral QAC breakfast  . LORazepam  1 mg Oral Once  . mupirocin ointment  1 application Nasal BID  . pantoprazole  40 mg Oral Daily  . polyvinyl alcohol  1 drop Both Eyes BID  . prednisoLONE acetate  1 drop Left Eye QID  . sertraline  200 mg Oral QHS  . sodium chloride  3 mL Intravenous Q12H  . [START ON 11/25/2014] vancomycin  1,000 mg Intravenous Q48H    Subjective: She is very upset and tearful. She states she was told that she would have to go to a different skilled nursing facility than the one she has been living in recently because she will need to  be on intravenous antibiotics.  Review of Systems: Pertinent items are noted in HPI.  Past Medical History  Diagnosis Date  . Hypertension   . CAD (coronary artery disease)   . 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 Macular edema     hx  . Glaucoma, both eyes   . High cholesterol   . CHF (congestive heart failure)   . Chronic kidney disease (CKD), stage II (mild)     [name] 10/07/2014  . Old myocardial infarct     "I was told I've had one; don't know when" (10/08/2014)  . Pneumonia 1990's X 1  . Hypothyroidism   . Type II diabetes mellitus   . Anemia   . History of blood transfusion 2003    "related to OHS"  . Stroke     "I've been told I've had one; I don't remember having any reaction from it at all"; denies residual on 10/08/2014    History  Substance Use Topics  . Smoking status: Never Smoker   . Smokeless tobacco: Never Used  . Alcohol Use: No    Family History  Problem  Relation Age of Onset  . CAD Neg Hx    Allergies  Allergen Reactions  . Epinephrine Other (See Comments)    Patient says she is not allergic to epi, but that it makes her jittery.  WILL RECEIVE IF NEEDED IN AN EMERGENCY.    OBJECTIVE: Blood pressure 156/60, pulse 58, temperature 98 F (36.7 C), temperature source Oral, resp. rate 18, height 5\' 1"  (1.549 m), weight 146 lb 6.2 oz (66.4 kg), SpO2 98 %. General: She is sitting up in a chair eating ice cream. She is tearful Skin: She has a new right arm saline lock IV Lungs: Clear Cor: Slow but regular S1 and S2 with a 2/6 systolic murmur   Lab Results Lab Results  Component Value Date   WBC 8.6 11/23/2014   HGB 9.2* 11/23/2014   HCT 28.9* 11/23/2014   MCV 88.4 11/23/2014   PLT 211 11/23/2014    Lab Results  Component Value Date   CREATININE 2.94* 11/24/2014   BUN 46* 11/24/2014   NA 131* 11/24/2014   K 3.4* 11/24/2014   CL 93* 11/24/2014   CO2 28 11/24/2014    Lab Results  Component Value Date   ALT 15 11/01/2014   AST 17 11/01/2014    ALKPHOS 67 11/01/2014   BILITOT 0.6 11/01/2014     Microbiology: Recent Results (from the past 240 hour(s))  Culture, blood (routine x 2)     Status: None   Collection Time: 11/21/14  9:30 PM  Result Value Ref Range Status   Specimen Description BLOOD RIGHT ARM  Final   Special Requests BOTTLES DRAWN AEROBIC AND ANAEROBIC 5ML  Final   Culture   Final    METHICILLIN RESISTANT STAPHYLOCOCCUS AUREUS Note: RIFAMPIN AND GENTAMICIN SHOULD NOT BE USED AS SINGLE DRUGS FOR TREATMENT OF STAPH INFECTIONS. CRITICAL RESULT CALLED TO, READ BACK BY AND VERIFIED WITH: CHRIS LUCK 11/23/14 1515 BY SMITHERSJ This organism DOES NOT demonstrate inducible Clindamycin  resistance in vitro. Note: Gram Stain Report Called to,Read Back By and Verified With: MIKE S RN @955PM  [id number] [facility] Performed at [facility]    Report Status 11/24/2014 FINAL  Final   Organism ID, Bacteria METHICILLIN RESISTANT STAPHYLOCOCCUS AUREUS  Final      Susceptibility   Methicillin resistant staphylococcus aureus - MIC*    CLINDAMYCIN <=0.25 SENSITIVE Sensitive     ERYTHROMYCIN >=8 RESISTANT Resistant     GENTAMICIN <=0.5 SENSITIVE Sensitive     LEVOFLOXACIN 4 INTERMEDIATE Intermediate     OXACILLIN >=4 RESISTANT Resistant     PENICILLIN >=0.5 RESISTANT Resistant     RIFAMPIN <=0.5 SENSITIVE Sensitive     TRIMETH/SULFA <=10 SENSITIVE Sensitive     VANCOMYCIN <=0.5 SENSITIVE Sensitive     TETRACYCLINE <=1 SENSITIVE Sensitive     * METHICILLIN RESISTANT STAPHYLOCOCCUS AUREUS  Culture, blood (routine x 2)     Status: None   Collection Time: 11/21/14  9:40 PM  Result Value Ref Range Status   Specimen Description BLOOD RIGHT HAND  Final   Special Requests BOTTLES DRAWN AEROBIC AND ANAEROBIC 5ML  Final   Culture   Final    STAPHYLOCOCCUS AUREUS Note: SUSCEPTIBILITIES PERFORMED ON PREVIOUS CULTURE WITHIN THE LAST 5 DAYS. Note: Gram Stain Report Called to,Read Back By and Verified With: Lona KettleMike S. RN on 11/23/14 at 02:30 by  Christie NottinghamAnne Skeen Performed at [facility]    Report Status 11/24/2014 FINAL  Final  Respiratory virus panel     Status: None  Collection Time: 11/21/14 10:00 PM  Result Value Ref Range Status   Respiratory Syncytial Virus A Negative Negative Final   Respiratory Syncytial Virus B Negative Negative Final   Influenza A Negative Negative Final   Influenza B Negative Negative Final   Parainfluenza 1 Negative Negative Final   Parainfluenza 2 Negative Negative Final   Parainfluenza 3 Negative Negative Final   Metapneumovirus Negative Negative Final   Rhinovirus Negative Negative Final   Adenovirus Negative Negative Final    Comment: (NOTE) Performed At: [facility] [address] [place], [place] [id number] Mila Homer MD WU:[id number]   Culture, Urine     Status: None   Collection Time: 11/22/14  1:37 AM  Result Value Ref Range Status   Specimen Description URINE, RANDOM  Final   Special Requests NONE  Final   Colony Count NO GROWTH Performed at [facility]   Final   Culture NO GROWTH Performed at [facility]   Final   Report Status 11/23/2014 FINAL  Final  Culture, blood (routine x 2)     Status: None (Preliminary result)   Collection Time: 11/23/14  3:20 PM  Result Value Ref Range Status   Specimen Description BLOOD RIGHT ARM  Final   Special Requests BOTTLES DRAWN AEROBIC AND ANAEROBIC 10CC  Final   Culture   Final           BLOOD CULTURE RECEIVED NO GROWTH TO DATE CULTURE WILL BE HELD FOR 5 DAYS BEFORE ISSUING A FINAL NEGATIVE REPORT Performed at [facility]    Report Status PENDING  Incomplete  Culture, blood (routine x 2)     Status: None (Preliminary result)   Collection Time: 11/23/14  3:30 PM  Result Value Ref Range Status   Specimen Description BLOOD RIGHT HAND  Final   Special Requests BOTTLES DRAWN AEROBIC ONLY 7CC  Final   Culture   Final           BLOOD CULTURE RECEIVED NO GROWTH TO DATE CULTURE WILL BE HELD FOR 5  DAYS BEFORE ISSUING A FINAL NEGATIVE REPORT Performed at [facility]    Report Status PENDING  Incomplete    Assessment: It appears that she developed MRSA bacteremia after hospitalization, most likely from her initial IV site. This means that her infection was caught early and is much less likely to be complicated. If her repeat blood cultures remain negative and there is no evidence of endocarditis on transthoracic echocardiogram it may be reasonable to complete her therapy with 14 days of oral linezolid. This will allow her to avoid a PICC and the necessity of transferring to a new skilled nursing facility while on IV antibiotic therapy.  Plan: 1. Continue vancomycin for now 2. Await results of repeat blood cultures and TTE  Cliffton Asters, MD [facility] for Infectious Disease [facility] [phone] pager   [phone] cell 11/24/2014, 4:34 PM

## 2014-11-24 NOTE — Progress Notes (Signed)
ANTIBIOTIC CONSULT NOTE - FOLLOW UP  Pharmacy Consult for Vancomycin Indication: bacteremia  Allergies  Allergen Reactions  . Epinephrine Other (See Comments)    Patient says she is not allergic to epi, but that it makes her jittery.  WILL RECEIVE IF NEEDED IN AN EMERGENCY.    Patient Measurements: Height:  (154.9 cm) Weight: 146 lb 6.2 oz (66.4 kg) IBW/kg (Calculated) : 47.8  Vital Signs: Temp: 98.2 F (36.8 C) (03/22 0510) Temp Source: Oral (03/22 0510) BP: 158/50 mmHg (03/22 0510) Pulse Rate: 54 (03/22 0510) Intake/Output from previous day: 03/21 0701 - 03/22 0700 In: 980 [P.O.:780; IV Piggyback:200] Out: 700 [Urine:700] Intake/Output from this shift:    Labs:  Recent Labs  11/22/14 0634 11/23/14 0619 11/24/14 0332  WBC  --  8.6  --   HGB  --  9.2*  --   PLT  --  211  --   CREATININE 2.29* 2.80* 2.94*   Estimated Creatinine Clearance: 15.5 mL/min (by C-G formula based on Cr of 2.94). No results for input(s): VANCOTROUGH, VANCOPEAK, VANCORANDOM, GENTTROUGH, GENTPEAK, GENTRANDOM, TOBRATROUGH, TOBRAPEAK, TOBRARND, AMIKACINPEAK, AMIKACINTROU, AMIKACIN in the last 72 hours.   Microbiology: Recent Results (from the past 720 hour(s))  MRSA PCR Screening     Status: Abnormal   Collection Time: 11/01/14 12:02 AM  Result Value Ref Range Status   MRSA by PCR POSITIVE (A) NEGATIVE Final    Comment:        The GeneXpert MRSA Assay (FDA approved for NASAL specimens only), is one component of a comprehensive MRSA colonization surveillance program. It is not intended to diagnose MRSA infection nor to guide or monitor treatment for MRSA infections. RESULT CALLED TO, READ BACK BY AND VERIFIED WITH: RASUL,G RN 0301 11/01/14 MITCHELL,L   Culture, blood (routine x 2)     Status: None (Preliminary result)   Collection Time: 11/21/14  9:30 PM  Result Value Ref Range Status   Specimen Description BLOOD RIGHT ARM  Final   Special Requests BOTTLES DRAWN AEROBIC AND  ANAEROBIC  Final   Culture   Final    METHICILLIN RESISTANT STAPHYLOCOCCUS AUREUS Note: RIFAMPIN AND GENTAMICIN SHOULD NOT BE USED AS SINGLE DRUGS FOR TREATMENT OF STAPH INFECTIONS. CRITICAL RESULT CALLED TO, READ BACK BY AND VERIFIED WITH: CHRIS LUCK 11/23/14 1515 BY SMITHERSJ Note: Gram Stain Report Called to,Read Back By and Verified With: MIKE S RN  [id number] VINCJ Performed at [facility]    Report Status PENDING  Incomplete  Culture, blood (routine x 2)     Status: None (Preliminary result)   Collection Time: 11/21/14  9:40 PM  Result Value Ref Range Status   Specimen Description BLOOD RIGHT HAND  Final   Special Requests BOTTLES DRAWN AEROBIC AND ANAEROBIC  Final   Culture   Final    GRAM POSITIVE COCCI IN CLUSTERS Note: Gram Stain Report Called to,Read Back By and Verified With: Lona Kettle RN on 11/23/14 at 02:30 by Christie Nottingham Performed at [facility]    Report Status PENDING  Incomplete  Respiratory virus panel     Status: None   Collection Time: 11/21/14 10:00 PM  Result Value Ref Range Status   Respiratory Syncytial Virus A Negative Negative Final   Respiratory Syncytial Virus B Negative Negative Final   Influenza A Negative Negative Final   Influenza B Negative Negative Final   Parainfluenza 1 Negative Negative Final   Parainfluenza 2 Negative Negative Final   Parainfluenza 3 Negative Negative  Final   Metapneumovirus Negative Negative Final   Rhinovirus Negative Negative Final   Adenovirus Negative Negative Final    Comment: (NOTE) Performed At: [facility] [address] [place], [place] [id number] Mila HomerHancock William F MD WU:[id number]:[phone]   Culture, Urine     Status: None   Collection Time: 11/22/14  1:37 AM  Result Value Ref Range Status   Specimen Description URINE, RANDOM  Final   Special Requests NONE  Final   Colony Count NO GROWTH Performed at [facility]   Final   Culture NO GROWTH Performed at [facility]    Final   Report Status 11/23/2014 FINAL  Final    Anti-infectives    Start     Dose/Rate Route Frequency Ordered Stop   11/23/14 2100  levofloxacin (LEVAQUIN) IVPB 500 mg  Status:  Discontinued     500 mg 100 mL/hr over 60 Minutes Intravenous Every 48 hours 11/21/14 2055 11/23/14 1519   11/23/14 1530  ceFAZolin (ANCEF) IVPB 2 g/50 mL premix  Status:  Discontinued     2 g 100 mL/hr over 30 Minutes Intravenous Every 12 hours 11/23/14 1437 11/24/14 0823   11/23/14 0100  vancomycin (VANCOCIN) IVPB 750 mg/150 ml premix     750 mg 150 mL/hr over 60 Minutes Intravenous Every 24 hours 11/23/14 0054     11/21/14 2100  levofloxacin (LEVAQUIN) IVPB 750 mg     750 mg 100 mL/hr over 90 Minutes Intravenous  Once 11/21/14 2055 11/21/14 2359      Assessment: 69yo female admitted 11/19/2014 d/t progressive SOB, lower extremity edema, and 11 pound wt gain over 10 days.  Acute respiratory failure vs ADHF.  Spiked a fever to 101.8 on 3/19, now with MRSA positive blood culture.  Pharmacy to dose vancomycin for MRSA bacteremia.  Pt is now afebrile and WBC WNL.  Her SCr continues to increase 2.02 >> 2.94, so dose of Vanc needs to be adjusted accordingly.  3/19 BCx2 >> MRSA (likely source IV line, pt refuses to remove/replace) 3/19 Resp virus panel >> neg (final 3/20 UCx >> neg (final) 3/21 BCx2 >>  Levaquin 3/19 x1 Ancef 3/21 x1 Vanc 3/21 >>   Goal of Therapy:  Vancomycin trough level 15-20 mcg/ml  Plan:  - Change to Vanc 1000mg  Q48h - Discontinue Ancef - F/U C&S, clinical improvement, renal function  Waynette Butteryegan K. Averey Trompeter, PharmD Clinical Pharmacy Resident Pager: [phone] 11/24/2014 8:38 AM

## 2014-11-24 NOTE — Progress Notes (Signed)
BSW intern talked to pt regarding d/c options. Pt refuses SNF at this time. She states that she can take her medication via po and will not go to a SNF. SW will talk with doctor if po medication option is available instead of IV. Pt was tearful and crying. LCSW Dahlia ClientHannah tried to talk to pt regarding going to SNF short term and then returning to ALF. Pt was very resistant and angry with plan.  Marcy SirenSamantha Kafi Dotter, BSW intern [phone]

## 2014-11-24 NOTE — Progress Notes (Signed)
Physical Therapy Treatment Patient Details Name: Valentino HueDorcas Sligar MRN: [id number] DOB: [birth year] Today's Date: 11/24/2014    History of Present Illness Patient is a 69 yo female admitted 11/19/14 with recurrent CHF, uncontrolled HTN, bradycardia.  PMH:  BLIND, DM, HTN, CDK, CAD, s/p CABG. Pt is blind.    PT Comments    Pt continues to refuse amb saying she can walk fine in her own environment. Explained she will get weak from inactivity and pt continues to refuse amb. Will sit in recliner. Pt crying throughout with multiple verbalizations about her situation. Difficult to get pt to focus on PT task.  Follow Up Recommendations  Supervision for mobility/OOB (return to ALF)     Equipment Recommendations       Recommendations for Other Services       Precautions / Restrictions Precautions Precautions: Fall Precaution Comments: Pt is legally blind    Mobility  Bed Mobility Overal bed mobility: Needs Assistance Bed Mobility: Supine to Sit     Supine to sit: Min assist     General bed mobility comments: Assist due to unfamiliar environment.  Transfers Overall transfer level: Needs assistance Equipment used: 1 person hand held assist Transfers: Sit to/from Stand Sit to Stand: Min guard Stand pivot transfers: Min guard       General transfer comment: verbal/tactile cues for environment.  Ambulation/Gait             General Gait Details: Pt refused.   Stairs            Wheelchair Mobility    Modified Rankin (Stroke Patients Only)       Balance                                    Cognition Arousal/Alertness: Awake/alert Behavior During Therapy: Anxious (Pt crying throughout most of treatment about her situation.)                   General Comments: Difficult to get pt to stay on task due to crying.    Exercises      General Comments        Pertinent Vitals/Pain Pain Assessment: No/denies pain    Home Living                       Prior Function            PT Goals (current goals can now be found in the care plan section) Progress towards PT goals: Not progressing toward goals - comment (pt refusing to amb)    Frequency  Min 3X/week    PT Plan Current plan remains appropriate    Co-evaluation             End of Session   Activity Tolerance: Other (comment) (Pt self limiting) Patient left: in chair;with call bell/phone within reach;with chair alarm set     Time: 1520-1553 PT Time Calculation (min) (ACUTE ONLY): 33 min  Charges:  $Gait Training: 8-22 mins                    G Codes:      Twilia Yaklin 11/24/2014, 4:33 PM  [facility] Keanen Dohse PT [phone]

## 2014-11-24 NOTE — Progress Notes (Signed)
[facility] PROGRESS NOTE  Jada Fass UEA:[id number] DOB: 1945-12-26 DOA: 11/19/2014 PCP: No PCP Per Patient  Assessment/Plan: 69 year old with CHF, CAD, CKD, DM, legal blindness, who presents with SOB, found to have acute on chronic diastolic HF exacerbation. She was treated with IV lasix, she has lost 20 pounds during this admission. Lasix has been on hold due to worsening renal function. Patient spike fever on 3-19, blood culture are growing MRSA, she was started on IV vancomycin. ID has been helping with the care. ECHO was ordered.   Acute respiratory failure with hypoxia/acute on chronic diastolic heart failure grade 2/moderate pulmonary hypertension 48 mmHg -Received Lasix to 60 mg IV every 12 hours .  -2-D echocardiogram February 2016 with EF 60-65% and restrictive physiology consistent with diastolic dysfunction -Continue afterload reduction with hydralazine and Isordil -Angiotensin Convertin enzymes at 25 normal.  -Nutrition consulted for diet education.  -negative 5 L. -Weight: 73---71---69---67 -no ace due to renal failure.  -lasix on hold due to worsening renal function.   Staph Aureus Bacteremia:  Continue vancomycin, ECHO ordered.  Follow ID recommendation.  Repeated Blood culture 3-21 no growth to date.    Sinus bradycardia -Hold Beta blocker.  -TSH: 9.5   Diabetes mellitus type II, uncontrolled -hold  Lantus due to hypoglycemia.  -hemoglobin A1c :  6.5 -Provide sliding scale insulin  Depression:  On Abilify, Zoloft  Psych consulted to assist with medications. No medications changes. Patients wants to follow up with PCP for this.    Hypertension -Continue medications: Norvasc, hydralazine, Isosorbide. No betablocker due to bradycardia, no ACE due to renal failure.  -started on Doxazosin 3-18, dose increase 3-20.    CKD (chronic kidney disease), stage II -Cr baseline at 1.9 -Follow labs with diuresis. -Cr continue to  increase today at  2.9.  Continue to hold Lasix.    Anemia, iron deficiency anemia.  -Check anemia panel -S/P one unit PRBC.  -Check fecal occult blood -needs outpatient evaluation.    CAD -History of coronary artery bypass grafting [facility] -Appears to be asymptomatic   Hypothyroidism -TSH in February greater than 10 with a free T4 of 1.02 -Review of patient's medication reconciliation sheet has documentation that patient not taking her Synthroid -Repeated TSH at 9.5  -Continue previously documented Synthroid 100 g daily   Dyslipidemia -Continue Lipitor   GERD  -Continue PPI and H2 blocker  Code Status: full code.  Family Communication: Care discussed with patient Disposition Plan: remain inpatient awaiting blood culture results. And improvement of renal function.    Consultants:  Cardiology  ID  Procedures:  ECHO ordered.   Antibiotics:  Vancomycin 3-21  HPI/Subjective: She is upset because she  lost her phone. Denies dyspnea.    Objective: Filed Vitals:   11/24/14 1238  BP: 156/60  Pulse: 58  Temp: 98 F (36.7 C)  Resp: 18    Intake/Output Summary (Last 24 hours) at 11/24/14 1412 Last data filed at 11/24/14 1233  Gross per 24 hour  Intake   1520 ml  Output   1025 ml  Net    495 ml   Filed Weights   11/22/14 0459 11/23/14 0605 11/24/14 0510  Weight: 67.949 kg (149 lb 12.8 oz) 67.438 kg (148 lb 10.8 oz) 66.4 kg (146 lb 6.2 oz)    Exam:   General:  Alert, morbid obese.   Cardiovascular: S 1, S 2 RRR  Respiratory: CTA  Abdomen: BS present, obese NT  Musculoskeletal: no edema  Data  Reviewed: Basic Metabolic Panel:  Recent Labs Lab 11/20/14 0410 11/21/14 0358 11/22/14 0634 11/23/14 0619 11/24/14 0332  NA 140 139 134* 131* 131*  K 3.5 4.0 3.5 3.7 3.4*  CL 106 103 95* 93* 93*  CO2 GLUCOSE 58* 107* 137* 126* 129*  BUN 30* 28* 30* 38* 46*  CREATININE 2.02* 2.03* 2.29* 2.80* 2.94*  CALCIUM 9.0 8.7 8.6 8.3*  8.2*   Liver Function Tests: No results for input(s): AST, ALT, ALKPHOS, BILITOT, PROT, ALBUMIN in the last 168 hours. No results for input(s): LIPASE, AMYLASE in the last 168 hours. No results for input(s): AMMONIA in the last 168 hours. CBC:  Recent Labs Lab 11/19/14 1215 11/20/14 0334 11/21/14 0358 11/23/14 0619  WBC 6.8  --  6.8 8.6  NEUTROABS 5.2  --   --   --   HGB 7.6* 9.6* 9.3* 9.2*  HCT 24.7* 30.5* 30.1* 28.9*  MCV 91.1  --  90.1 88.4  PLT 304  --  298 211   Cardiac Enzymes: No results for input(s): CKTOTAL, CKMB, CKMBINDEX, TROPONINI in the last 168 hours. BNP (last 3 results)  Recent Labs  10/07/14 1327 10/31/14 1645 11/19/14 1215  BNP 2584.7* 1931.3* 2140.8*    ProBNP (last 3 results) No results for input(s): PROBNP in the last 8760 hours.  CBG:  Recent Labs Lab 11/23/14 0604 11/23/14 1100 11/23/14 1648 11/23/14 2102 11/24/14 0621  GLUCAP 128* 165* 113* 152* 126*    Recent Results (from the past 240 hour(s))  Culture, blood (routine x 2)     Status: None   Collection Time: 11/21/14  9:30 PM  Result Value Ref Range Status   Specimen Description BLOOD RIGHT ARM  Final   Special Requests BOTTLES DRAWN AEROBIC AND ANAEROBIC  Final   Culture   Final    METHICILLIN RESISTANT STAPHYLOCOCCUS AUREUS Note: RIFAMPIN AND GENTAMICIN SHOULD NOT BE USED AS SINGLE DRUGS FOR TREATMENT OF STAPH INFECTIONS. CRITICAL RESULT CALLED TO, READ BACK BY AND VERIFIED WITH: CHRIS LUCK 11/23/14 1515 BY SMITHERSJ This organism DOES NOT demonstrate inducible Clindamycin  resistance in vitro. Note: Gram Stain Report Called to,Read Back By and Verified With: MIKE S RN  [id number] VINCJ Performed at [facility]    Report Status 11/24/2014 FINAL  Final   Organism ID, Bacteria METHICILLIN RESISTANT STAPHYLOCOCCUS AUREUS  Final      Susceptibility   Methicillin resistant staphylococcus aureus - MIC*    CLINDAMYCIN <=0.25 SENSITIVE Sensitive     ERYTHROMYCIN >=8  RESISTANT Resistant     GENTAMICIN <=0.5 SENSITIVE Sensitive     LEVOFLOXACIN 4 INTERMEDIATE Intermediate     OXACILLIN >=4 RESISTANT Resistant     PENICILLIN >=0.5 RESISTANT Resistant     RIFAMPIN <=0.5 SENSITIVE Sensitive     TRIMETH/SULFA <=10 SENSITIVE Sensitive     VANCOMYCIN <=0.5 SENSITIVE Sensitive     TETRACYCLINE <=1 SENSITIVE Sensitive     * METHICILLIN RESISTANT STAPHYLOCOCCUS AUREUS  Culture, blood (routine x 2)     Status: None   Collection Time: 11/21/14  9:40 PM  Result Value Ref Range Status   Specimen Description BLOOD RIGHT HAND  Final   Special Requests BOTTLES DRAWN AEROBIC AND ANAEROBIC  Final   Culture   Final    STAPHYLOCOCCUS AUREUS Note: SUSCEPTIBILITIES PERFORMED ON PREVIOUS CULTURE WITHIN THE LAST 5 DAYS. Note: Gram Stain Report Called to,Read Back By and Verified With: Lona Kettle RN on 11/23/14 at 02:30 by Thurston Hole  Skeen Performed at [facility]    Report Status 11/24/2014 FINAL  Final  Respiratory virus panel     Status: None   Collection Time: 11/21/14 10:00 PM  Result Value Ref Range Status   Respiratory Syncytial Virus A Negative Negative Final   Respiratory Syncytial Virus B Negative Negative Final   Influenza A Negative Negative Final   Influenza B Negative Negative Final   Parainfluenza 1 Negative Negative Final   Parainfluenza 2 Negative Negative Final   Parainfluenza 3 Negative Negative Final   Metapneumovirus Negative Negative Final   Rhinovirus Negative Negative Final   Adenovirus Negative Negative Final    Comment: (NOTE) Performed At: [facility] [address] [place], [place] [id number] Mila HomerHancock William F MD AO:[phone]   Culture, Urine     Status: None   Collection Time: 11/22/14  1:37 AM  Result Value Ref Range Status   Specimen Description URINE, RANDOM  Final   Special Requests NONE  Final   Colony Count NO GROWTH Performed at [facility]   Final   Culture NO GROWTH Performed at [facility]   Final   Report Status 11/23/2014 FINAL  Final  Culture, blood (routine x 2)     Status: None (Preliminary result)   Collection Time: 11/23/14  3:20 PM  Result Value Ref Range Status   Specimen Description BLOOD RIGHT ARM  Final   Special Requests BOTTLES DRAWN AEROBIC AND ANAEROBIC 10CC  Final   Culture   Final           BLOOD CULTURE RECEIVED NO GROWTH TO DATE CULTURE WILL BE HELD FOR 5 DAYS BEFORE ISSUING A FINAL NEGATIVE REPORT Performed at [facility]    Report Status PENDING  Incomplete  Culture, blood (routine x 2)     Status: None (Preliminary result)   Collection Time: 11/23/14  3:30 PM  Result Value Ref Range Status   Specimen Description BLOOD RIGHT HAND  Final   Special Requests BOTTLES DRAWN AEROBIC ONLY 7CC  Final   Culture   Final           BLOOD CULTURE RECEIVED NO GROWTH TO DATE CULTURE WILL BE HELD FOR 5 DAYS BEFORE ISSUING A FINAL NEGATIVE REPORT Performed at [facility]    Report Status PENDING  Incomplete     Studies: No results found.  Scheduled Meds: . sodium chloride   Intravenous Once  . amLODipine  10 mg Oral Daily  . ARIPiprazole  5 mg Oral Daily  . aspirin  325 mg Oral Daily  . atorvastatin  80 mg Oral QHS  . Chlorhexidine Gluconate Cloth  6 each Topical Q0600  . clopidogrel  75 mg Oral Daily  . dorzolamide-timolol  1 drop Both Eyes Q12H  . doxazosin  4 mg Oral Daily  . enoxaparin (LOVENOX) injection  30 mg Subcutaneous Q24H  . famotidine  20 mg Oral QHS  . ferrous sulfate  325 mg Oral BID WC  . fluticasone  1 spray Each Nare Daily  . hydrALAZINE  100 mg Oral 3 times per day  . insulin aspart  0-15 Units Subcutaneous TID WC  . insulin aspart  0-5 Units Subcutaneous QHS  . isosorbide dinitrate  20 mg Oral TID  . levothyroxine  100 mcg Oral QAC breakfast  . LORazepam  1 mg Oral Once  . mupirocin ointment  1 application Nasal BID  . pantoprazole  40 mg Oral Daily  . polyvinyl alcohol  1 drop Both Eyes BID  .  prednisoLONE acetate  1 drop Left Eye QID  . sertraline  200 mg Oral QHS  . sodium chloride  3 mL Intravenous Q12H  . [START ON 11/25/2014] vancomycin  1,000 mg Intravenous Q48H   Continuous Infusions:   Principal Problem:   Staphylococcus aureus bacteremia Active Problems:   Diabetes mellitus type II, uncontrolled   Hypertension   CAD (coronary artery disease)   CKD (chronic kidney disease), stage II   Hypothyroidism   Dyslipidemia   GERD (gastroesophageal reflux disease)   Acute respiratory failure with hypoxia   Sinus bradycardia   Anemia   Acute on chronic diastolic heart failure   Acute diastolic heart failure, NYHA class 1   Fever    Time spent: 35 minutes.     Hartley Barefoot A  [facility] Pager [phone]. If 7PM-7AM, please contact night-coverage at [email or web address], password [facility] 11/24/2014, 2:12 PM  LOS: 5 days

## 2014-11-24 NOTE — Progress Notes (Signed)
Security called concerning pts personal cell phone.  Pt saying she has lost it.  Staff RNs and NTs have looked all over the room for phone and it is not there.  Laundry called and notified us that if phone comes up they will call us.  

## 2014-11-24 NOTE — Progress Notes (Addendum)
Subjective:    Taylor Padilla is a 69 y.o. female with a history of GERD, HLD, HTN, DM, glaucoma/legal blidness, CKD, anemia, hypothyroidism, CAD s/p 4v CABG in 2003 in [place] and chronic diastolic CHF with multiple recent readmissions who presented to [facility] today with recurrent CHF.   Patient was recently hospitalized from 2/3-2/6 because of HF exacerbation. She was new to our cardiology service and seen in consult by Dr. Eden Emms. 2D ECHO on 10/08/14 w/ EF 60-65%, no RWMAs, doppler parameters c/w restrictive physiology. PA pk pressure 48. She was successfully diuresed in the hospital and discharged on 80 mg Lasix daily. Her weight was 144lbs at discharge. Outpatient cardiology follow up was supposed to be arranged with nuclear stress testing in the office. However; she did not take her lasix as prescribed and was re-admitted with another CHF exacerbation from 2/27-3/01. She was discharged on  Lasix qd. Discharge weight 151lbs. However, patient said that she was only taking  alternating with  every other day. She was seen by the in-house PA at her assisted living facility last Wednesday and Lasix was increased to  qd due to increased SOB. However, she has continue to gain weight. She was readmitted 3/17 up 15lbs with weight 166 lbs. The advanced HF team was asked to assume care.   Off IV Lasix. Weight down another 2 lbs (20 lbs total). Cr continues to rise, up to 2.94 today. Denies SOB.   Initial blood cultures + for staph aureus but repeat blood cultures negative to date. ID evaluated patient and changed to cefazolin, continuing vanc. Has remained afebrile.    Objective:   Weight Range:  Vital Signs:   Temp:  [97.8 F (36.6 C)-98.2 F (36.8 C)] 98.2 F (36.8 C) (03/22 0510) Pulse Rate:  [50-58] 54 (03/22 0510) Resp:  [18] 18 (03/22 0510) BP: (126-158)/(47-51) 158/50 mmHg (03/22 0510) SpO2:  [77 %-98 %] 98 % (03/22 0510) Weight:  [146 lb 6.2 oz (66.4 kg)] 146 lb 6.2 oz  (66.4 kg) (03/22 0510) Last BM Date: 11/23/14  Weight change: Filed Weights   11/22/14 0459 11/23/14 0605 11/24/14 0510  Weight: 149 lb 12.8 oz (67.949 kg) 148 lb 10.8 oz (67.438 kg) 146 lb 6.2 oz (66.4 kg)    Intake/Output:   Intake/Output Summary (Last 24 hours) at 11/24/14 1111 Last data filed at 11/24/14 0900  Gross per 24 hour  Intake   1040 ml  Output    700 ml  Net    340 ml     Physical Exam: General:  Blind woman lying in bed, NAD HEENT: normal Neck: supple. JVP 5-6 . Carotids 2+ bilat; no bruits.  Cor: PMI nondisplaced. Regular brady. Systolic murmur heard best at 2nd left intercostal space.  Lungs: clear to auscultation bilaterally, breaths non-labored  Abdomen: BS+, soft, nontender, nondistended. No hepatosplenomegaly. Extremities: no cyanosis, clubbing, rash, no edema.  Neuro: alert & oriented x 3, moves all 4 extremities w/o difficulty. Affect pleasant  Telemetry: SBrady 50s   Labs: Basic Metabolic Panel:  Recent Labs Lab 11/20/14 0410 11/21/14 0358 11/22/14 0634 11/23/14 0619 11/24/14 0332  NA 140 139 134* 131* 131*  K 3.5 4.0 3.5 3.7 3.4*  CL 106 103 95* 93* 93*  CO2 GLUCOSE 58* 107* 137* 126* 129*  BUN 30* 28* 30* 38* 46*  CREATININE 2.02* 2.03* 2.29* 2.80* 2.94*  CALCIUM 9.0 8.7 8.6 8.3* 8.2*    Recent Labs Lab 11/19/14 1215 11/20/14 0334 11/21/14  [id number] 11/23/14 0619  WBC 6.8  --  6.8 8.6  NEUTROABS 5.2  --   --   --   HGB 7.6* 9.6* 9.3* 9.2*  HCT 24.7* 30.5* 30.1* 28.9*  MCV 91.1  --  90.1 88.4  PLT 304  --  298 211   BNP (last 3 results)  Recent Labs  10/07/14 1327 10/31/14 1645 11/19/14 1215  BNP 2584.7* 1931.3* 2140.8*    Imaging: Dg Chest 2 View  11/22/2014   CLINICAL DATA:  Pt admitted 11/19/14 with recurrent CHF, uncontrolled HTN, bradycardia.  EXAM: CHEST  2 VIEW  COMPARISON:  11/20/2014 and 11/19/2014  FINDINGS: Interstitial thickening has resolved. There are only minimal residual effusions. No areas  of lung consolidation.  Changes from CABG surgery are stable.  Mild stable cardiomegaly.  Lungs are mildly hyperexpanded.  IMPRESSION: Congestive heart failure appears resolved. There are minimal residual pleural effusions.   Electronically Signed   By: Amie Portlandavid  Ormond M.D.   On: 11/22/2014 11:49     Medications:     Scheduled Medications: . sodium chloride   Intravenous Once  . amLODipine  10 mg Oral Daily  . ARIPiprazole  5 mg Oral Daily  . aspirin  325 mg Oral Daily  . atorvastatin  80 mg Oral QHS  . Chlorhexidine Gluconate Cloth  6 each Topical Q0600  . clopidogrel  75 mg Oral Daily  . dorzolamide-timolol  1 drop Both Eyes Q12H  . doxazosin  4 mg Oral Daily  . enoxaparin (LOVENOX) injection  30 mg Subcutaneous Q24H  . famotidine  20 mg Oral QHS  . ferrous sulfate  325 mg Oral BID WC  . fluticasone  1 spray Each Nare Daily  . hydrALAZINE  100 mg Oral 3 times per day  . insulin aspart  0-15 Units Subcutaneous TID WC  . insulin aspart  0-5 Units Subcutaneous QHS  . isosorbide dinitrate  20 mg Oral TID  . levothyroxine  100 mcg Oral QAC breakfast  . LORazepam  1 mg Oral Once  . mupirocin ointment  1 application Nasal BID  . pantoprazole  40 mg Oral Daily  . polyvinyl alcohol  1 drop Both Eyes BID  . prednisoLONE acetate  1 drop Left Eye QID  . sertraline  200 mg Oral QHS  . sodium chloride  3 mL Intravenous Q12H  . [START ON 11/25/2014] vancomycin  1,000 mg Intravenous Q48H    Infusions:    PRN Medications: sodium chloride, acetaminophen, nitroGLYCERIN, ondansetron (ZOFRAN) IV, sodium chloride, traMADol   Assessment:   1. A/c diastolic HF 2. CKD, stage 3 3. Legal blindness 4. Bradycardia - coreg stopped this admit 5. Severe HTN, uncontrolled 6. DM2 7. CAD s/p CABG 8. Staph aureus bacteremia   Plan/Discussion:   Lasix off 3/21. Creatinine still increasing (up to 2.94 today) despite stopping Lasix. Weight down another 2 lbs (20 lbs total).    BP better controlled  with systolic BPs in 120-150s. On Norvasc and hydralazine maxed. Doxazosin increased to 4 mg daily yesterday. No Ace with CKD.   Repeat blood cultures negative to date. Seen by ID and antibiotics switched to cefazolin, vanc continued. Will need a repeat TTE to assess for vegetations.   Length of Stay: 5 Rivet, [name] MD 11/24/2014, 11:11 AM  Patient seen and examined with Dr. Beckie Saltsivet. We discussed all aspects of the encounter. I agree with the assessment and plan as stated above.   She is understandably very upset today about losing her cell  phone. We searched her room for her and could not find it. I will ask social work if we can help her with this.   Volume status looks ok after 20 pound diuresis. Renal function worse again. Continue to hold diuretics. She does have a TR murmur on exam which was previously undocumented. This is in setting of staph bacteremia from likely infected IV - so possibility of right-sided endocarditis is real but low. Will repeat TTE. Can get TEE as needed.   SBP much improved on doxazosin.   Daniel Bensimhon,MD 12:24 PM

## 2014-11-24 NOTE — Psychosocial Assessment (Cosign Needed)
Clinical Social Work Department BRIEF PSYCHOSOCIAL ASSESSMENT 11/24/2014  Patient:  Taylor Padilla, Taylor Padilla     Account Number:  [id number]     Admit date:  11/19/2014  Clinical Social Worker:  Elam Dutch  Date/Time:  11/20/2014 03:35 PM  Referred by:  Physician  Date Referred:  11/20/2014 Referred for  Other - See comment   Other Referral:   Return to ALF when stable?   Interview type:  Patient Other interview type:    PSYCHOSOCIAL DATA Living Status:  FACILITY Admitted from facility:  Other Level of care:  Assisted Living Primary support name:  Taylor Padilla [phone] Primary support relationship to patient:   Degree of support available:   Strong support    CURRENT CONCERNS Current Concerns  Post-Acute Placement   Other Concerns:   ?Return to ALF?    SOCIAL WORK ASSESSMENT / PLAN CSW met with this  69 year old female- current resident of [facility].  Patient states that she is "content" there and that the care is good- however, she wished that she did not have to enter assisted care.  She is legally blind but states she can see some shadows and outlines.  CSW spoke to [facility] [place] and patient's condition was discussed. She verbalized concerns that patient is not always compliant with taking her medications and wanted to see if this was an issue at the hospital. CSW spoke to patient's nurse who stated that she had not refused any medications thus far.  Patient hopes to return to ALF and will await PT evaluation to insure that she can return there. She states that she mostly uses a wheelchair at the facility and manages well. Fl2 placed on chart for MD" signature.   Assessment/plan status:  Psychosocial Support/Ongoing Assessment of Needs Other assessment/ plan:   Information/referral to community resources:   None at this time.    PATIENT'S/FAMILY'S RESPONSE TO PLAN OF CARE: Patient is alert, oriented and welcoming to CSW during visit.  She  stated she is hopeful that she will be able to return to the ALF at d/c and does not want to consider SNF unless absolutely necessary.Patient states that she has close family support and that she feels they will want her to return there as well.  CSW asked to contact her sister Taylor Padilla and she declined at this time stating that they keep in touch via telephone and that she would keep her posted on any changes in her d/c plan. Patient stated that it would be ok to call her once d/c is confirmed as she does not keep secrets from her sister.  CSW will assist patient with d/c options and provide ongoing support.   Taylor Padilla. [place], [place]

## 2014-11-25 ENCOUNTER — Inpatient Hospital Stay (HOSPITAL_COMMUNITY): Payer: Medicare PPO

## 2014-11-25 DIAGNOSIS — N179 Acute kidney failure, unspecified: Secondary | ICD-10-CM

## 2014-11-25 DIAGNOSIS — R509 Fever, unspecified: Secondary | ICD-10-CM

## 2014-11-25 LAB — CBC
HCT: 25.8 % — ABNORMAL LOW (ref 36.0–46.0)
Hemoglobin: 8.2 g/dL — ABNORMAL LOW (ref 12.0–15.0)
MCH: 27.9 pg (ref 26.0–34.0)
MCHC: 31.8 g/dL (ref 30.0–36.0)
MCV: 87.8 fL (ref 78.0–100.0)
PLATELETS: 186 10*3/uL (ref 150–400)
RBC: 2.94 MIL/uL — ABNORMAL LOW (ref 3.87–5.11)
RDW: 16.4 % — ABNORMAL HIGH (ref 11.5–15.5)
WBC: 6.5 10*3/uL (ref 4.0–10.5)

## 2014-11-25 LAB — GLUCOSE, CAPILLARY
GLUCOSE-CAPILLARY: 125 mg/dL — AB (ref 70–99)
GLUCOSE-CAPILLARY: 154 mg/dL — AB (ref 70–99)
GLUCOSE-CAPILLARY: 134 mg/dL — AB (ref 70–99)
Glucose-Capillary: 154 mg/dL — ABNORMAL HIGH (ref 70–99)

## 2014-11-25 LAB — URINALYSIS, ROUTINE W REFLEX MICROSCOPIC
BILIRUBIN URINE: NEGATIVE
Glucose, UA: NEGATIVE mg/dL
HGB URINE DIPSTICK: NEGATIVE
Ketones, ur: NEGATIVE mg/dL
Leukocytes, UA: NEGATIVE
Nitrite: NEGATIVE
PROTEIN: 100 mg/dL — AB
Specific Gravity, Urine: 1.012 (ref 1.005–1.030)
Urobilinogen, UA: 0.2 mg/dL (ref 0.0–1.0)
pH: 5.5 (ref 5.0–8.0)

## 2014-11-25 LAB — BASIC METABOLIC PANEL
ANION GAP: 9 (ref 5–15)
BUN: 51 mg/dL — ABNORMAL HIGH (ref 6–23)
CALCIUM: 8 mg/dL — AB (ref 8.4–10.5)
CO2: 26 mmol/L (ref 19–32)
CREATININE: 3.06 mg/dL — AB (ref 0.50–1.10)
Chloride: 95 mmol/L — ABNORMAL LOW (ref 96–112)
GFR calc Af Amer: 17 mL/min — ABNORMAL LOW (ref >90)
GFR, EST NON AFRICAN AMERICAN: 14 mL/min — AB (ref >90)
Glucose, Bld: 120 mg/dL — ABNORMAL HIGH (ref 70–99)
Potassium: 4.5 mmol/L (ref 3.5–5.1)
SODIUM: 130 mmol/L — AB (ref 135–145)

## 2014-11-25 LAB — URINE MICROSCOPIC-ADD ON

## 2014-11-25 LAB — SODIUM, URINE, RANDOM: Sodium, Ur: <10 mmol/L

## 2014-11-25 LAB — CREATININE, URINE, RANDOM: Creatinine, Urine: 73.75 mg/dL

## 2014-11-25 MED ORDER — FERROUS SULFATE 325 (65 FE) MG PO TABS
325.0000 mg | ORAL_TABLET | Freq: Three times a day (TID) | ORAL | Status: DC
  Administered 2014-11-25: 325 mg via ORAL
  Filled 2014-11-25 (×2): qty 1

## 2014-11-25 MED ORDER — FERROUS SULFATE 325 (65 FE) MG PO TABS
325.0000 mg | ORAL_TABLET | Freq: Three times a day (TID) | ORAL | Status: DC
  Administered 2014-11-26 (×2): 325 mg via ORAL
  Filled 2014-11-25 (×4): qty 1

## 2014-11-25 MED ORDER — LINEZOLID 600 MG PO TABS
600.0000 mg | ORAL_TABLET | Freq: Two times a day (BID) | ORAL | Status: DC
  Administered 2014-11-25 – 2014-11-26 (×3): 600 mg via ORAL
  Filled 2014-11-25 (×4): qty 1

## 2014-11-25 MED ORDER — ASPIRIN EC 81 MG PO TBEC
81.0000 mg | DELAYED_RELEASE_TABLET | Freq: Every day | ORAL | Status: DC
  Administered 2014-11-25 – 2014-11-26 (×2): 81 mg via ORAL
  Filled 2014-11-25 (×2): qty 1

## 2014-11-25 MED ORDER — INSULIN ASPART 100 UNIT/ML ~~LOC~~ SOLN
0.0000 [IU] | Freq: Three times a day (TID) | SUBCUTANEOUS | Status: DC
  Administered 2014-11-26: 2 [IU] via SUBCUTANEOUS

## 2014-11-25 NOTE — Progress Notes (Signed)
Patient ID: Taylor Padilla, female   DOB: Aug 23, 1946, 69 y.o.   MRN: [id number]         [facility]    Date of Admission:  11/19/2014           Day 4 vancomycin  Principal Problem:   Staphylococcus aureus bacteremia Active Problems:   Diabetes mellitus type II, uncontrolled   Hypertension   CAD (coronary artery disease)   CKD (chronic kidney disease), stage II   Hypothyroidism   Dyslipidemia   GERD (gastroesophageal reflux disease)   Acute respiratory failure with hypoxia   Sinus bradycardia   Anemia   Acute on chronic diastolic heart failure   Acute diastolic heart failure, NYHA class 1   Fever   . sodium chloride   Intravenous Once  . amLODipine  10 mg Oral Daily  . ARIPiprazole  5 mg Oral Daily  . aspirin EC  81 mg Oral Daily  . atorvastatin  80 mg Oral QHS  . clopidogrel  75 mg Oral Daily  . dorzolamide-timolol  1 drop Both Eyes Q12H  . doxazosin  4 mg Oral Daily  . enoxaparin (LOVENOX) injection  30 mg Subcutaneous Q24H  . famotidine  20 mg Oral QHS  . ferrous sulfate  325 mg Oral BID WC  . fluticasone  1 spray Each Nare Daily  . hydrALAZINE  100 mg Oral 3 times per day  . insulin aspart  0-15 Units Subcutaneous TID WC  . insulin aspart  0-5 Units Subcutaneous QHS  . isosorbide dinitrate  20 mg Oral TID  . levothyroxine  100 mcg Oral QAC breakfast  . LORazepam  1 mg Oral Once  . mupirocin ointment  1 application Nasal BID  . pantoprazole  40 mg Oral Daily  . polyvinyl alcohol  1 drop Both Eyes BID  . prednisoLONE acetate  1 drop Left Eye QID  . sertraline  200 mg Oral QHS  . sodium chloride  3 mL Intravenous Q12H  . vancomycin  1,000 mg Intravenous Q48H    Subjective: She is feeling much better today. Her breathing has improved.  Review of Systems: Pertinent items are noted in HPI.  Past Medical History  Diagnosis Date  . Hypertension   . CAD (coronary artery disease)   . 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 Macular  edema     hx  . Glaucoma, both eyes   . High cholesterol   . CHF (congestive heart failure)   . Chronic kidney disease (CKD), stage II (mild)     [name] 10/07/2014  . Old myocardial infarct     "I was told I've had one; don't know when" (10/08/2014)  . Pneumonia 1990's X 1  . Hypothyroidism   . Type II diabetes mellitus   . Anemia   . History of blood transfusion 2003    "related to OHS"  . Stroke     "I've been told I've had one; I don't remember having any reaction from it at all"; denies residual on 10/08/2014    History  Substance Use Topics  . Smoking status: Never Smoker   . Smokeless tobacco: Never Used  . Alcohol Use: No    Family History  Problem Relation Age of Onset  . CAD Neg Hx    Allergies  Allergen Reactions  . Epinephrine Other (See Comments)    Patient says she is not allergic to epi, but that it makes her jittery.  WILL RECEIVE IF  NEEDED IN AN EMERGENCY.    OBJECTIVE: Blood pressure 169/44, pulse 56, temperature 98 F (36.7 C), temperature source Oral, resp. rate 18, height 5\' 1"  (1.549 m), weight 145 lb 4.2 oz (65.89 kg), SpO2 97 %. General: She is sitting up in a chair. She is in much better spirits today Lungs: Clear Cor: Regular S1 and S2 with a 2/6 systolic murmur   Lab Results Lab Results  Component Value Date   WBC 6.5 11/25/2014   HGB 8.2* 11/25/2014   HCT 25.8* 11/25/2014   MCV 87.8 11/25/2014   PLT 186 11/25/2014    Lab Results  Component Value Date   CREATININE 3.06* 11/25/2014   BUN 51* 11/25/2014   NA 130* 11/25/2014   K 4.5 11/25/2014   CL 95* 11/25/2014   CO2 26 11/25/2014    Lab Results  Component Value Date   ALT 15 11/01/2014   AST 17 11/01/2014   ALKPHOS 67 11/01/2014   BILITOT 0.6 11/01/2014     Microbiology: Recent Results (from the past 240 hour(s))  Culture, blood (routine x 2)     Status: None   Collection Time: 11/21/14  9:30 PM  Result Value Ref Range Status   Specimen Description BLOOD RIGHT ARM  Final     Special Requests BOTTLES DRAWN AEROBIC AND ANAEROBIC 5ML  Final   Culture   Final    METHICILLIN RESISTANT STAPHYLOCOCCUS AUREUS Note: RIFAMPIN AND GENTAMICIN SHOULD NOT BE USED AS SINGLE DRUGS FOR TREATMENT OF STAPH INFECTIONS. CRITICAL RESULT CALLED TO, READ BACK BY AND VERIFIED WITH: CHRIS LUCK 11/23/14 1515 BY SMITHERSJ This organism DOES NOT demonstrate inducible Clindamycin  resistance in vitro. Note: Gram Stain Report Called to,Read Back By and Verified With: MIKE S RN @955PM  [phone] [facility] Performed at [facility]    Report Status 11/24/2014 FINAL  Final   Organism ID, Bacteria METHICILLIN RESISTANT STAPHYLOCOCCUS AUREUS  Final      Susceptibility   Methicillin resistant staphylococcus aureus - MIC*    CLINDAMYCIN <=0.25 SENSITIVE Sensitive     ERYTHROMYCIN >=8 RESISTANT Resistant     GENTAMICIN <=0.5 SENSITIVE Sensitive     LEVOFLOXACIN 4 INTERMEDIATE Intermediate     OXACILLIN >=4 RESISTANT Resistant     PENICILLIN >=0.5 RESISTANT Resistant     RIFAMPIN <=0.5 SENSITIVE Sensitive     TRIMETH/SULFA <=10 SENSITIVE Sensitive     VANCOMYCIN <=0.5 SENSITIVE Sensitive     TETRACYCLINE <=1 SENSITIVE Sensitive     * METHICILLIN RESISTANT STAPHYLOCOCCUS AUREUS  Culture, blood (routine x 2)     Status: None   Collection Time: 11/21/14  9:40 PM  Result Value Ref Range Status   Specimen Description BLOOD RIGHT HAND  Final   Special Requests BOTTLES DRAWN AEROBIC AND ANAEROBIC 5ML  Final   Culture   Final    STAPHYLOCOCCUS AUREUS Note: SUSCEPTIBILITIES PERFORMED ON PREVIOUS CULTURE WITHIN THE LAST 5 DAYS. Note: Gram Stain Report Called to,Read Back By and Verified With: Lona KettleMike S. RN on 11/23/14 at 02:30 by Christie NottinghamAnne Skeen Performed at [facility]    Report Status 11/24/2014 FINAL  Final  Respiratory virus panel     Status: None   Collection Time: 11/21/14 10:00 PM  Result Value Ref Range Status   Respiratory Syncytial Virus A Negative Negative Final   Respiratory  Syncytial Virus B Negative Negative Final   Influenza A Negative Negative Final   Influenza B Negative Negative Final   Parainfluenza 1 Negative Negative Final   Parainfluenza  2 Negative Negative Final   Parainfluenza 3 Negative Negative Final   Metapneumovirus Negative Negative Final   Rhinovirus Negative Negative Final   Adenovirus Negative Negative Final    Comment: (NOTE) Performed At: [facility] [address] [place], [place] [id number] Mila Homer MD WU:[id number]   Culture, Urine     Status: None   Collection Time: 11/22/14  1:37 AM  Result Value Ref Range Status   Specimen Description URINE, RANDOM  Final   Special Requests NONE  Final   Colony Count NO GROWTH Performed at [facility]   Final   Culture NO GROWTH Performed at [facility]   Final   Report Status 11/23/2014 FINAL  Final  Culture, blood (routine x 2)     Status: None (Preliminary result)   Collection Time: 11/23/14  3:20 PM  Result Value Ref Range Status   Specimen Description BLOOD RIGHT ARM  Final   Special Requests BOTTLES DRAWN AEROBIC AND ANAEROBIC 10CC  Final   Culture   Final           BLOOD CULTURE RECEIVED NO GROWTH TO DATE CULTURE WILL BE HELD FOR 5 DAYS BEFORE ISSUING A FINAL NEGATIVE REPORT Performed at [facility]    Report Status PENDING  Incomplete  Culture, blood (routine x 2)     Status: None (Preliminary result)   Collection Time: 11/23/14  3:30 PM  Result Value Ref Range Status   Specimen Description BLOOD RIGHT HAND  Final   Special Requests BOTTLES DRAWN AEROBIC ONLY 7CC  Final   Culture   Final           BLOOD CULTURE RECEIVED NO GROWTH TO DATE CULTURE WILL BE HELD FOR 5 DAYS BEFORE ISSUING A FINAL NEGATIVE REPORT Performed at [facility]    Report Status PENDING  Incomplete   Transthoracic echocardiogram 11/25/2014  - Left ventricle: The cavity size was normal. Wall thickness was increased in a pattern of mild LVH.  Systolic function was normal. The estimated ejection fraction was in the range of 55% to 60%. Wall motion was normal; there were no regional wall motion abnormalities. Features are consistent with a pseudonormal left ventricular filling pattern, with concomitant abnormal relaxation and increased filling pressure (grade 2 diastolic dysfunction). - Mitral valve: Calcified annulus. Mildly thickened leaflets . There was moderate regurgitation. - Left atrium: The atrium was moderately dilated. - Right ventricle: The cavity size was moderately dilated. Wall thickness was mildly increased. - Right atrium: The atrium was moderately dilated. - Pulmonary arteries: Systolic pressure was severely increased. PA peak pressure: 96 mm Hg (S).  Impressions:  - When compared to prior, pulmonary pressures have increased. There was no evidence of a vegetation.  Donato Schultz, M.D. 2016-03-23T09:47:02  Assessment: Her MRSA bacteremia was caught very early and treated appropriately. She has no evidence of endocarditis or other complications by exam and TTE. Repeat blood cultures are negative. I will complete therapy with 10 more days of oral linezolid.  Plan: 1. Change vancomycin to oral linezolid and treat for 10 more days 2. I will sign off now  Cliffton Asters, MD [facility] [phone] pager   [phone] cell 11/25/2014, 1:23 PM

## 2014-11-25 NOTE — Progress Notes (Signed)
[facility] PROGRESS NOTE  Ebony Yorio ZOX:[id number] DOB: 07-18-1946 DOA: 11/19/2014 PCP: No PCP Per Patient  Brief Summary  69 year old with CHF, CAD, CKD, DM, legal blindness, who presented with SOB, found to have acute on chronic diastolic HF exacerbation. She was treated with IV lasix, she has lost 20 pounds during this admission. Lasix was held due to worsening renal function. Patient spike fever on 3-19, blood culture are growing MRSA, she was started on IV vancomycin. ID has been helping with the care. ECHO negative for vegetation but demonstrates elevated PA pressure and evidence of elevated CVP.    Assessment/Plan  Acute respiratory failure with hypoxia/acute on chronic diastolic heart failure grade 2/moderate pulmonary hypertension 48 mmHg - Initially received Lasix to 60 mg IV every 12 hours .  -2-D echocardiogram February 2016 with EF 60-65% and restrictive physiology consistent with diastolic dysfunction -  ECHO now demonstrates preserved EF with grade 2 DD, moderate MR, moderately dilated RV and RA with peak PA pressure of 96 mmHg -Continue afterload reduction with hydralazine and Isordil -Angiotensin Convertin enzymes at 25 normal.  -Nutrition consulted for diet education.  - + 1L -Weight: 73---71---69---67-- 66kg -no ace due to renal failure.  -lasix currently being held due to AKI, but blunted IVC suggests volume overload - defer further diuresis for now  Staph Aureus Bacteremia:  -  D/c vancomycin -  No vegetation on ECHO -  linezolid ordered by ID, through April 1st, then stop -  BCx 3/21 NGTD -  Appreciate ID recommendations  Sinus bradycardia -Hold Beta blocker.  -TSH: 9.5  Diabetes mellitus type II, controlled - Lantus held due to hypoglycemia.  -hemoglobin A1c : 6.5 -continue sliding scale insulin  Depression:  On Abilify -  D/c Zoloft for now due to drug-drug interaction with linezolid, but may be restarted after linezolid  completed -  Psych consulted to assist with medications. No medications changes. Patients wants to follow up with PCP for this.   Hypertension -Continue medications: Norvasc, hydralazine, Isosorbide. No betablocker due to bradycardia, no ACE due to renal failure.  -started on Doxazosin 3-18, dose increase 3-20.  -  Allow mild permissive hypertension due to AKI  Acute on CKD (chronic kidney disease), stage II -Cr baseline at 1.9 -Cr continuing to increase -  Diuretics still on hold  -  No recent contrasted studies or NSAIDS -  D/c vancomycin -  Renal US -  FENa -  Consider nephrology consult if not improving tomorrow  Anemia, iron deficiency anemia and probably a component of renal failure -  iron deficiency -  B12 and folate wnl -  S/P one unit PRBC.  -  Check fecal occult blood -  Needs outpatient evaluation.  -  Increase to TID iron -  Consider IV iron infusion   CAD -  History of coronary artery bypass grafting [facility] -  Appears to be asymptomatic -  Continue ASA, plavix, high dose statin -  No BB due to bradycardia -  No ACEI due to AKI   Hypothyroidism -TSH in February greater than 10 with a free T4 of 1.02 -Review of patient's medication reconciliation sheet has documentation that patient not taking her Synthroid -Repeated TSH at 9.5  -Continue previously documented Synthroid 100 g daily   Dyslipidemia -Continue Lipitor   GERD  -Continue PPI and H2 blocker  Hyponatremia may be related to AKI and hypervolemia  Diet:  Diabetic, healhty heart Access:  PIV IVF:  off Proph:  lovenox  Code Status: full Family Communication: pateint alone Disposition Plan: pending improvement in kidney function and possibly more diuresis.  Will need nephrology consultation if creatinine not improving tomorrow   Consultants:  Cardiology, Dr. Gala Romney  ID, Dr. Orvan Falconer  Procedures:  ECHO ordered.  Antibiotics:  Vancomycin 3-21 >  3/23  Linezolid 3/23 >> 4/1  HPI/Subjective:  Feels fine, denies pain, SOB, nausea, vomiting.  Wants to go home.    Objective: Filed Vitals:   11/24/14 2040 11/25/14 0600 11/25/14 1055 11/25/14 1515  BP: 155/67 153/46 169/44 152/72  Pulse: 57 55 56 52  Temp: 98.4 F (36.9 C) 98.7 F (37.1 C) 98 F (36.7 C) 98.4 F (36.9 C)  TempSrc: Oral Oral Oral Oral  Resp: Height:      Weight:  65.89 kg (145 lb 4.2 oz)    SpO2: 96% 96% 97% 99%    Intake/Output Summary (Last 24 hours) at 11/25/14 1600 Last data filed at 11/25/14 1431  Gross per 24 hour  Intake   1320 ml  Output    850 ml  Net    470 ml   Filed Weights   11/23/14 0605 11/24/14 0510 11/25/14 0600  Weight: 67.438 kg (148 lb 10.8 oz) 66.4 kg (146 lb 6.2 oz) 65.89 kg (145 lb 4.2 oz)    Exam:   General:  Tearful adult female, No acute distress  HEENT:  NCAT, MMM  Cardiovascular:  RRR, nl S1, S2 no mrg, 2+ pulses, warm extremities  Respiratory:  Rales at bilateral bases, no rhonchi or wheeze, no increased WOB  Abdomen:   NABS, soft, NT/ND  MSK:   Normal tone and bulk, no LEE   Neuro:  Grossly intact  Data Reviewed: Basic Metabolic Panel:  Recent Labs Lab 11/21/14 0358 11/22/14 0634 11/23/14 0619 11/24/14 0332 11/25/14 0425  NA 139 134* 131* 131* 130*  K 4.0 3.5 3.7 3.4* 4.5  CL 103 95* 93* 93* 95*  CO2 GLUCOSE 107* 137* 126* 129* 120*  BUN 28* 30* 38* 46* 51*  CREATININE 2.03* 2.29* 2.80* 2.94* 3.06*  CALCIUM 8.7 8.6 8.3* 8.2* 8.0*   Liver Function Tests: No results for input(s): AST, ALT, ALKPHOS, BILITOT, PROT, ALBUMIN in the last 168 hours. No results for input(s): LIPASE, AMYLASE in the last 168 hours. No results for input(s): AMMONIA in the last 168 hours. CBC:  Recent Labs Lab 11/19/14 1215 11/20/14 0334 11/21/14 0358 11/23/14 0619 11/25/14 0425  WBC 6.8  --  6.8 8.6 6.5  NEUTROABS 5.2  --   --   --   --   HGB 7.6* 9.6* 9.3* 9.2* 8.2*  HCT 24.7*  30.5* 30.1* 28.9* 25.8*  MCV 91.1  --  90.1 88.4 87.8  PLT 304  --  298 211 186   Cardiac Enzymes: No results for input(s): CKTOTAL, CKMB, CKMBINDEX, TROPONINI in the last 168 hours. BNP (last 3 results)  Recent Labs  10/07/14 1327 10/31/14 1645 11/19/14 1215  BNP 2584.7* 1931.3* 2140.8*    ProBNP (last 3 results) No results for input(s): PROBNP in the last 8760 hours.  CBG:  Recent Labs Lab 11/24/14 1156 11/24/14 1714 11/24/14 2042 11/25/14 0608 11/25/14 1130  GLUCAP 158* 151* 162* 125* 154*    Recent Results (from the past 240 hour(s))  Culture, blood (routine x 2)     Status: None   Collection Time: 11/21/14  9:30 PM  Result Value Ref Range Status  Specimen Description BLOOD RIGHT ARM  Final   Special Requests BOTTLES DRAWN AEROBIC AND ANAEROBIC 5ML  Final   Culture   Final    METHICILLIN RESISTANT STAPHYLOCOCCUS AUREUS Note: RIFAMPIN AND GENTAMICIN SHOULD NOT BE USED AS SINGLE DRUGS FOR TREATMENT OF STAPH INFECTIONS. CRITICAL RESULT CALLED TO, READ BACK BY AND VERIFIED WITH: CHRIS LUCK 11/23/14 1515 BY SMITHERSJ This organism DOES NOT demonstrate inducible Clindamycin  resistance in vitro. Note: Gram Stain Report Called to,Read Back By and Verified With: MIKE S RN @955PM  [phone] [facility] Performed at [facility]    Report Status 11/24/2014 FINAL  Final   Organism ID, Bacteria METHICILLIN RESISTANT STAPHYLOCOCCUS AUREUS  Final      Susceptibility   Methicillin resistant staphylococcus aureus - MIC*    CLINDAMYCIN <=0.25 SENSITIVE Sensitive     ERYTHROMYCIN >=8 RESISTANT Resistant     GENTAMICIN <=0.5 SENSITIVE Sensitive     LEVOFLOXACIN 4 INTERMEDIATE Intermediate     OXACILLIN >=4 RESISTANT Resistant     PENICILLIN >=0.5 RESISTANT Resistant     RIFAMPIN <=0.5 SENSITIVE Sensitive     TRIMETH/SULFA <=10 SENSITIVE Sensitive     VANCOMYCIN <=0.5 SENSITIVE Sensitive     TETRACYCLINE <=1 SENSITIVE Sensitive     * METHICILLIN RESISTANT STAPHYLOCOCCUS AUREUS   Culture, blood (routine x 2)     Status: None   Collection Time: 11/21/14  9:40 PM  Result Value Ref Range Status   Specimen Description BLOOD RIGHT HAND  Final   Special Requests BOTTLES DRAWN AEROBIC AND ANAEROBIC 5ML  Final   Culture   Final    STAPHYLOCOCCUS AUREUS Note: SUSCEPTIBILITIES PERFORMED ON PREVIOUS CULTURE WITHIN THE LAST 5 DAYS. Note: Gram Stain Report Called to,Read Back By and Verified With: Lona KettleMike S. RN on 11/23/14 at 02:30 by Christie NottinghamAnne Skeen Performed at [facility]    Report Status 11/24/2014 FINAL  Final  Respiratory virus panel     Status: None   Collection Time: 11/21/14 10:00 PM  Result Value Ref Range Status   Respiratory Syncytial Virus A Negative Negative Final   Respiratory Syncytial Virus B Negative Negative Final   Influenza A Negative Negative Final   Influenza B Negative Negative Final   Parainfluenza 1 Negative Negative Final   Parainfluenza 2 Negative Negative Final   Parainfluenza 3 Negative Negative Final   Metapneumovirus Negative Negative Final   Rhinovirus Negative Negative Final   Adenovirus Negative Negative Final    Comment: (NOTE) Performed At: [facility] [address] [place], [place] [id number] Mila HomerHancock William F MD VH:[phone]   Culture, Urine     Status: None   Collection Time: 11/22/14  1:37 AM  Result Value Ref Range Status   Specimen Description URINE, RANDOM  Final   Special Requests NONE  Final   Colony Count NO GROWTH Performed at [facility]   Final   Culture NO GROWTH Performed at [facility]   Final   Report Status 11/23/2014 FINAL  Final  Culture, blood (routine x 2)     Status: None (Preliminary result)   Collection Time: 11/23/14  3:20 PM  Result Value Ref Range Status   Specimen Description BLOOD RIGHT ARM  Final   Special Requests BOTTLES DRAWN AEROBIC AND ANAEROBIC 10CC  Final   Culture   Final           BLOOD CULTURE RECEIVED NO GROWTH TO DATE CULTURE WILL BE HELD FOR 5 DAYS  BEFORE ISSUING A FINAL NEGATIVE REPORT Performed  at [facility]    Report Status PENDING  Incomplete  Culture, blood (routine x 2)     Status: None (Preliminary result)   Collection Time: 11/23/14  3:30 PM  Result Value Ref Range Status   Specimen Description BLOOD RIGHT HAND  Final   Special Requests BOTTLES DRAWN AEROBIC ONLY 7CC  Final   Culture   Final           BLOOD CULTURE RECEIVED NO GROWTH TO DATE CULTURE WILL BE HELD FOR 5 DAYS BEFORE ISSUING A FINAL NEGATIVE REPORT Performed at [facility]    Report Status PENDING  Incomplete     Studies: No results found.  Scheduled Meds: . sodium chloride   Intravenous Once  . amLODipine  10 mg Oral Daily  . ARIPiprazole  5 mg Oral Daily  . aspirin EC  81 mg Oral Daily  . atorvastatin  80 mg Oral QHS  . clopidogrel  75 mg Oral Daily  . dorzolamide-timolol  1 drop Both Eyes Q12H  . doxazosin  4 mg Oral Daily  . enoxaparin (LOVENOX) injection  30 mg Subcutaneous Q24H  . famotidine  20 mg Oral QHS  . ferrous sulfate  325 mg Oral BID WC  . fluticasone  1 spray Each Nare Daily  . hydrALAZINE  100 mg Oral 3 times per day  . insulin aspart  0-15 Units Subcutaneous TID WC  . insulin aspart  0-5 Units Subcutaneous QHS  . isosorbide dinitrate  20 mg Oral TID  . levothyroxine  100 mcg Oral QAC breakfast  . linezolid  600 mg Oral Q12H  . LORazepam  1 mg Oral Once  . mupirocin ointment  1 application Nasal BID  . pantoprazole  40 mg Oral Daily  . polyvinyl alcohol  1 drop Both Eyes BID  . prednisoLONE acetate  1 drop Left Eye QID  . sodium chloride  3 mL Intravenous Q12H   Continuous Infusions:   Principal Problem:   Staphylococcus aureus bacteremia Active Problems:   Diabetes mellitus type II, uncontrolled   Hypertension   CAD (coronary artery disease)   CKD (chronic kidney disease), stage II   Hypothyroidism   Dyslipidemia   GERD (gastroesophageal reflux disease)   Acute respiratory failure with hypoxia    Sinus bradycardia   Anemia   Acute on chronic diastolic heart failure   Acute diastolic heart failure, NYHA class 1   Fever    Time spent: 30 min    Gulianna Hornsby  [facility] Pager [phone]. If 7PM-7AM, please contact night-coverage at [email or web address], password [facility] 11/25/2014, 4:00 PM  LOS: 6 days

## 2014-11-25 NOTE — Progress Notes (Signed)
Subjective:    Taylor Padilla is a 69 y.o. female with a history of GERD, HLD, HTN, DM, glaucoma/legal blidness, CKD, anemia, hypothyroidism, CAD s/p 4v CABG in 2003 in [place] and chronic diastolic CHF with multiple recent readmissions who presented to [facility] with recurrent CHF.   Has diuresed 20 pounds. Lasix on hold due to worsening renal function. Bcx + for MRSA.   Off IV Lasix. Weight down another 1 lbs (21 lbs total). Cr continues to rise slightly, up to 3.0 today. Denies SOB.   Objective:   Weight Range:  Vital Signs:   Temp:  [98 F (36.7 C)-98.7 F (37.1 C)] 98.4 F (36.9 C) (03/23 1515) Pulse Rate:  [52-57] 52 (03/23 1515) Resp:  [16-18] 16 (03/23 1515) BP: (152-169)/(44-72) 152/72 mmHg (03/23 1515) SpO2:  [96 %-99 %] 99 % (03/23 1515) Weight:  [145 lb 4.2 oz (65.89 kg)] 145 lb 4.2 oz (65.89 kg) (03/23 0600) Last BM Date: 11/24/14  Weight change: Filed Weights   11/23/14 0605 11/24/14 0510 11/25/14 0600  Weight: 148 lb 10.8 oz (67.438 kg) 146 lb 6.2 oz (66.4 kg) 145 lb 4.2 oz (65.89 kg)    Intake/Output:   Intake/Output Summary (Last 24 hours) at 11/25/14 1641 Last data filed at 11/25/14 1600  Gross per 24 hour  Intake   1320 ml  Output    900 ml  Net    420 ml     Physical Exam: General:  Blind woman lying in bed, NAD HEENT: normal Neck: supple. JVP 5-6 . Carotids 2+ bilat; no bruits.  Cor: PMI nondisplaced. Regular brady. Systolic murmur heard best at 2nd left intercostal space.  Lungs: clear to auscultation bilaterally, breaths non-labored  Abdomen: BS+, soft, nontender, nondistended. No hepatosplenomegaly. Extremities: no cyanosis, clubbing, rash, no edema.  Neuro: alert & oriented x 3, moves all 4 extremities w/o difficulty. Affect pleasant  Telemetry: SBrady 50s   Labs: Basic Metabolic Panel:  Recent Labs Lab 11/21/14 0358 11/22/14 0634 11/23/14 0619 11/24/14 0332 11/25/14 0425  NA 139 134* 131* 131* 130*  K 4.0 3.5 3.7 3.4* 4.5  CL  103 95* 93* 93* 95*  CO2 GLUCOSE 107* 137* 126* 129* 120*  BUN 28* 30* 38* 46* 51*  CREATININE 2.03* 2.29* 2.80* 2.94* 3.06*  CALCIUM 8.7 8.6 8.3* 8.2* 8.0*    Recent Labs Lab 11/19/14 1215 11/20/14 0334 11/21/14 0358 11/23/14 0619 11/25/14 0425  WBC 6.8  --  6.8 8.6 6.5  NEUTROABS 5.2  --   --   --   --   HGB 7.6* 9.6* 9.3* 9.2* 8.2*  HCT 24.7* 30.5* 30.1* 28.9* 25.8*  MCV 91.1  --  90.1 88.4 87.8  PLT 304  --  298 211 186   BNP (last 3 results)  Recent Labs  10/07/14 1327 10/31/14 1645 11/19/14 1215  BNP 2584.7* 1931.3* 2140.8*    Imaging: No results found.   Medications:     Scheduled Medications: . sodium chloride   Intravenous Once  . amLODipine  10 mg Oral Daily  . ARIPiprazole  5 mg Oral Daily  . aspirin EC  81 mg Oral Daily  . atorvastatin  80 mg Oral QHS  . clopidogrel  75 mg Oral Daily  . dorzolamide-timolol  1 drop Both Eyes Q12H  . doxazosin  4 mg Oral Daily  . enoxaparin (LOVENOX) injection  30 mg Subcutaneous Q24H  . famotidine  20 mg Oral QHS  . ferrous sulfate  325 mg Oral TID WC  . fluticasone  1 spray Each Nare Daily  . hydrALAZINE  100 mg Oral 3 times per day  . insulin aspart  0-15 Units Subcutaneous TID WC  . insulin aspart  0-5 Units Subcutaneous QHS  . isosorbide dinitrate  20 mg Oral TID  . levothyroxine  100 mcg Oral QAC breakfast  . linezolid  600 mg Oral Q12H  . LORazepam  1 mg Oral Once  . mupirocin ointment  1 application Nasal BID  . pantoprazole  40 mg Oral Daily  . polyvinyl alcohol  1 drop Both Eyes BID  . prednisoLONE acetate  1 drop Left Eye QID  . sodium chloride  3 mL Intravenous Q12H    Infusions:    PRN Medications: sodium chloride, acetaminophen, nitroGLYCERIN, ondansetron (ZOFRAN) IV, sodium chloride, traMADol   Assessment:   1. A/c diastolic HF 2. CKD, stage 3 3. Legal blindness 4. Bradycardia - coreg stopped this admit 5. Severe HTN, uncontrolled 6. DM2 7. CAD s/p CABG 8. Staph  aureus bacteremia   Plan/Discussion:    Volume status looks ok after 20 pound diuresis. Renal function worse again. Continue to hold diuretics.   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 There is no evidence of any vegetations. Would not do TEE now. If bcx become become positive again after treatment would then recommend TEE.  SBP  improved on doxazosin. Would not push BP down further as kidneys need perfusion to recover from ATN. No RAS on u/s 2/16.  Beecher Furio,MD 4:41 PM

## 2014-11-25 NOTE — Progress Notes (Signed)
  Echocardiogram 2D Echocardiogram has been performed.  [name], Arielis Leonhart M 11/25/2014, 9:20 AM

## 2014-11-26 LAB — BASIC METABOLIC PANEL
Anion gap: 10 (ref 5–15)
BUN: 51 mg/dL — AB (ref 6–23)
CO2: 27 mmol/L (ref 19–32)
CREATININE: 2.69 mg/dL — AB (ref 0.50–1.10)
Calcium: 8.5 mg/dL (ref 8.4–10.5)
Chloride: 98 mmol/L (ref 96–112)
GFR calc Af Amer: 20 mL/min — ABNORMAL LOW (ref >90)
GFR, EST NON AFRICAN AMERICAN: 17 mL/min — AB (ref >90)
Glucose, Bld: 133 mg/dL — ABNORMAL HIGH (ref 70–99)
Potassium: 4 mmol/L (ref 3.5–5.1)
Sodium: 135 mmol/L (ref 135–145)

## 2014-11-26 LAB — GLUCOSE, CAPILLARY
GLUCOSE-CAPILLARY: 148 mg/dL — AB (ref 70–99)
Glucose-Capillary: 121 mg/dL — ABNORMAL HIGH (ref 70–99)

## 2014-11-26 MED ORDER — FERROUS SULFATE 325 (65 FE) MG PO TABS: 325.0000 mg | ORAL_TABLET | Freq: Three times a day (TID) | ORAL | Status: DC

## 2014-11-26 MED ORDER — FUROSEMIDE 80 MG PO TABS: ORAL_TABLET | ORAL | Status: DC

## 2014-11-26 MED ORDER — LINEZOLID 600 MG PO TABS
600.0000 mg | ORAL_TABLET | Freq: Two times a day (BID) | ORAL | Status: DC
Start: 2014-11-26 — End: 2014-12-31

## 2014-11-26 MED ORDER — ASPIRIN 81 MG PO TBEC: 81.0000 mg | DELAYED_RELEASE_TABLET | Freq: Every day | ORAL | Status: DC

## 2014-11-26 MED ORDER — LEVOTHYROXINE SODIUM 100 MCG PO TABS: 100.0000 ug | ORAL_TABLET | Freq: Every day | ORAL | Status: AC

## 2014-11-26 MED ORDER — DOXAZOSIN MESYLATE 4 MG PO TABS: 4.0000 mg | ORAL_TABLET | Freq: Every day | ORAL | Status: DC

## 2014-11-26 NOTE — Progress Notes (Signed)
Subjective:    Taylor Padilla is a 69 y.o. female with a history of GERD, HLD, HTN, DM, glaucoma/legal blidness, CKD, anemia, hypothyroidism, CAD s/p 4v CABG in 2003 in [place] and chronic diastolic CHF with multiple recent readmissions who presented to [facility] with recurrent CHF.   She has diuresed 20 lbs, weight stable at 145 lbs. Lasix on hold due to worsening renal function. Cr has improved this morning to 2.7. Bcx + for MRSA, repeat cultures negative. Repeat TTE did not show evidence of vegetations.  On Linezolid to be completed through April 1st.   She denies dyspnea. Has remained afebrile.   Objective:   Weight Range:  Vital Signs:   Temp:  [97.1 F (36.2 C)-98.4 F (36.9 C)] 97.1 F (36.2 C) (03/24 0434) Pulse Rate:  [52-61] 60 (03/24 0434) Resp:  [16-18] 18 (03/24 0434) BP: (152-172)/(44-72) 167/47 mmHg (03/24 0434) SpO2:  [97 %-99 %] 99 % (03/24 0434) Weight:  [145 lb 1 oz (65.8 kg)] 145 lb 1 oz (65.8 kg) (03/24 0434) Last BM Date: 11/24/14  Weight change: Filed Weights   11/24/14 0510 11/25/14 0600 11/26/14 0434  Weight: 146 lb 6.2 oz (66.4 kg) 145 lb 4.2 oz (65.89 kg) 145 lb 1 oz (65.8 kg)    Intake/Output:   Intake/Output Summary (Last 24 hours) at 11/26/14 0856 Last data filed at 11/26/14 0557  Gross per 24 hour  Intake   1420 ml  Output   1350 ml  Net     70 ml     Physical Exam: General:  Blind woman sitting up in bed eating breakfast, NAD HEENT: Taylor Padilla/AT, EOMI, mucus membranes moist Neck: supple. JVP to jaw. Carotids 2+ bilat; no bruits.  Cor: PMI nondisplaced. Regular brady. Systolic murmur heard best at 2nd left intercostal space.  Lungs: clear to auscultation bilaterally, breaths non-labored  Abdomen: BS+, soft, nontender, nondistended. No hepatosplenomegaly. Extremities: no cyanosis, clubbing, rash, no edema.  Neuro: alert & oriented x 3, moves all 4 extremities w/o difficulty. Affect pleasant  Telemetry: SBrady 50s   Labs: Basic Metabolic  Panel:  Recent Labs Lab 11/22/14 0634 11/23/14 0619 11/24/14 0332 11/25/14 0425 11/26/14 0515  NA 134* 131* 131* 130* 135  K 3.5 3.7 3.4* 4.5 4.0  CL 95* 93* 93* 95* 98  CO2 GLUCOSE 137* 126* 129* 120* 133*  BUN 30* 38* 46* 51* 51*  CREATININE 2.29* 2.80* 2.94* 3.06* 2.69*  CALCIUM 8.6 8.3* 8.2* 8.0* 8.5    Recent Labs Lab 11/19/14 1215 11/20/14 0334 11/21/14 0358 11/23/14 0619 11/25/14 0425  WBC 6.8  --  6.8 8.6 6.5  NEUTROABS 5.2  --   --   --   --   HGB 7.6* 9.6* 9.3* 9.2* 8.2*  HCT 24.7* 30.5* 30.1* 28.9* 25.8*  MCV 91.1  --  90.1 88.4 87.8  PLT 304  --  298 211 186   BNP (last 3 results)  Recent Labs  10/07/14 1327 10/31/14 1645 11/19/14 1215  BNP 2584.7* 1931.3* 2140.8*    Imaging: US Renal  11/25/2014   CLINICAL DATA:  Acute kidney injury.  EXAM: RENAL/URINARY TRACT ULTRASOUND COMPLETE  COMPARISON:  None.  FINDINGS: Right Kidney:  Length: 10.2 cm. Normal parenchymal echogenicity. Mild renal cortical thinning. Exophytic cyst projects 2 midpole noted 2.4 cm. No other renal masses, no stones and no hydronephrosis.  Left Kidney:  Length: 11.0 cm. Normal parenchymal echogenicity. Mild renal cortical thinning. 19 mm cyst arises from the lower  pole. Some questionable nodularity along its medial margin. No other complicating features. No other masses, no stones and no hydronephrosis.  Bladder:  Appears normal for degree of bladder distention.  IMPRESSION: 1. No hydronephrosis.  Normal parenchymal echogenicity. 2. Mild bilateral renal cortical thinning. 3. Bilateral renal cysts. Left renal cyst, arising from the lower pole, shows mildly complicating features. Recommend followup ultrasound in 4-6 months to document stability. If earlier characterization is desired clinically, renal MRI with and without contrast would be recommended.   Electronically Signed   By: Amie Portlandavid  Ormond M.D.   On: 11/25/2014 17:36     Medications:     Scheduled Medications: .  sodium chloride   Intravenous Once  . amLODipine  10 mg Oral Daily  . ARIPiprazole  5 mg Oral Daily  . aspirin EC  81 mg Oral Daily  . atorvastatin  80 mg Oral QHS  . clopidogrel  75 mg Oral Daily  . dorzolamide-timolol  1 drop Both Eyes Q12H  . doxazosin  4 mg Oral Daily  . enoxaparin (LOVENOX) injection  30 mg Subcutaneous Q24H  . famotidine  20 mg Oral QHS  . ferrous sulfate  325 mg Oral TID WC  . fluticasone  1 spray Each Nare Daily  . hydrALAZINE  100 mg Oral 3 times per day  . insulin aspart  0-15 Units Subcutaneous TID WC  . insulin aspart  0-5 Units Subcutaneous QHS  . isosorbide dinitrate  20 mg Oral TID  . levothyroxine  100 mcg Oral QAC breakfast  . linezolid  600 mg Oral Q12H  . LORazepam  1 mg Oral Once  . pantoprazole  40 mg Oral Daily  . polyvinyl alcohol  1 drop Both Eyes BID  . prednisoLONE acetate  1 drop Left Eye QID  . sodium chloride  3 mL Intravenous Q12H    Infusions:    PRN Medications: sodium chloride, acetaminophen, nitroGLYCERIN, ondansetron (ZOFRAN) IV, sodium chloride, traMADol   Assessment:   1. A/c diastolic HF 2. CKD, stage 3 3. Legal blindness 4. Bradycardia - coreg stopped this admit 5. Severe HTN, uncontrolled 6. DM2 7. CAD s/p CABG 8. Staph aureus bacteremia   Plan/Discussion:    Volume status better after 21 lb diuresis. Renal function mildly improved this morning. Will still hold diuretics.   Her repeat echo showed moderate MR/TR which is unchanged from previous echo. No vegetations present. TEE not necessary at this time. If blood cultures become positive again after completing treatment with Linezolid then would do TEE.   SBP elevated in 160-170s. Improved to 150s on doxazosin yesterday.Marland Kitchen. No RAS or hydronephrosis on u/s 3/23. Creatinine trending in right direction this morning.   Taylor Padilla, [name],MD 8:56 AM   Patient seen and examined with Taylor Padilla. We discussed all aspects of the encounter. I agree with the assessment and  plan as stated above. Volume status creeping back. Creatinine improved.  I think she can go back to Taylor Padilla today. The trick will be to find the right dose of oral lasix. Previously was on lasix 80 daily and gained 20 pounds. Will try lasix 80 in am and 40 pm. Would have assisted living weigh her every day. If weight 150 or greater increase lasix to 80 bid. Continue hyrdalazine/isosorbide. Not on ACE due to renal failure. Carvedilol on hold due to severe bradycardia. Would continue to hod for now.   Will see back in 1 week in [facility] with [facility].   Taylor Sova,MD 11:24 AM

## 2014-11-26 NOTE — Discharge Summary (Signed)
Physician Discharge Summary  Taylor [name] WUJ:[id number] DOB: 1946-03-17 DOA: 11/19/2014  PCP: No PCP Per Patient  Admit date: 11/19/2014 Discharge date: 11/26/2014  Recommendations for Outpatient Follow-up:  1. Transfer back to ALF 2. Continue linezolid through 4/1, then stop 3. Please restart zoloft 200mg  po QHS on 4/2 (held for now due to drug-drug interaction with linezolid) 4. Cardiology appointment and BMP in 1 week 5. Daily weights and if her weight increases to 150lbs, she needs to increase her lasix to 80mg  po BID and call the heart failure clinic, Dr. Gala RomneyBensimhon 6. If she develops chest pain, worsening shortness of breath, or fever, she needs to seek immediate medical attention 7. Repeat renal US in 4-6 months to evaluate left renal cyst 8. PCP to please refer to GI for possible endoscopy for iron deficiency anemia   9. Started iron supplementation so will need repeat hemoglobin +/- iron studies in 1 month 10. Repeat TSH in 3-4 weeks 11. Geripsych consultation   Discharge Diagnoses:  Principal Problem:   Staphylococcus aureus bacteremia Active Problems:   Diabetes mellitus type II, uncontrolled   Hypertension   CAD (coronary artery disease)   CKD (chronic kidney disease), stage II   Hypothyroidism   Dyslipidemia   GERD (gastroesophageal reflux disease)   Acute respiratory failure with hypoxia   Sinus bradycardia   Anemia   Acute on chronic diastolic heart failure   Acute diastolic heart failure, NYHA class 1   Fever   AKI (acute kidney injury)   Discharge Condition: stable, improved  Diet recommendation: diabetic, healthy heart  Wt Readings from Last 3 Encounters:  11/26/14 65.8 kg (145 lb 1 oz)  11/03/14 68.675 kg (151 lb 6.4 oz)  10/10/14 65.499 kg (144 lb 6.4 oz)    History of present illness:  69 year old with CHF, CAD, CKD, DM, legal blindness, who presented with SOB and was found to have acute on chronic diastolic HF exacerbation.  She was treated with  IV lasix and lost 20 pounds during this admission.  She developed some acute kidney injury so her lasix was held. On 3-19, she spiked a fever and blood cultures grew MRSA, she was started on IV vancomycin.  ID was consulted. ECHO was negative for vegetation but demonstrated elevated PA pressure and evidence of elevated CVP. It was felt her bacteremia was caused by an IV site and that her infection was caught early.  She was transitioned to linezolid to complete a 14-day course, last day on 4/1.  Her zoloft was held for now because of a drug-drug interaction, but should be restarted once her antibiotics are completed.  After holding her diuretics for a few days, her creatinine started to improve and she will be discharged on lasix 80mg  in the morning and 40mg  in the evening.  Her lasix dose is difficult and she will need daily weights.  If she gains weight and her weight exceeds 150-lbs, her lasix dose needs to be increased to 80mg  po BID and she will need a follow up appointment within 1 week with the heart failure clinic.    Hospital Course:   Acute respiratory failure with hypoxia/acute on chronic diastolic heart failure grade 2/moderate pulmonary hypertension 48 mmHg - Initially received Lasix to 60 mg IV every 12 hours .  - ECHO in February 2016 demonstrated EF 60-65% and restrictive physiology consistent with diastolic dysfunction - TTE during this admission demonstrated preserved EF with grade 2 DD, moderate MR, moderately dilated RV and RA with  peak PA pressure of 96 mmHg - Continued afterload reduction with hydralazine and Isordil - She developed AKI so her diuretics and ACE were discontinued -  Her kidney function improved -  Recommend lasix 80mg  in the morning and 40mg  in the evening -  Daily weights and increase lasix to 80mg  BID if her weight reaches 150-lbs and call the cardiology office to schedule an appointment for within one week -  Weight at discharge 65.8kg  MRSA Bacteremia:  felt to be secondary to a PIV - blood culture 3/19:  MRSA  -  Infectious disease was consulted -  Started on vancomycin 3/19 >> 3/23 - No vegetation on TTE - transitioned to linezolid by Infectious disease expert -  Continue linezolid through April 1st, then stop -  zoloft held while on linezolid  - BCx 3/21 NGTD  Sinus bradycardia - Beta blocker discontinued -TSH: 9.5  Diabetes mellitus type II, controlled - Lantus held due to hypoglycemia.  -hemoglobin A1c : 6.5 -continued sliding scale insulin  Depression:  -  On Abilify - Held Zoloft for now due to drug-drug interaction with linezolid, but will need to be restarted after linezolid completed - Psych consulted to assist with medications. No medications changes. Patients wants to follow up with PCP for this.   Hypertension -  Continue medications: Norvasc, hydralazine, Isosorbide. No betablocker due to bradycardia, no ACE due to renal failure.  -  started on Doxazosin 3-18, dose increase 3-20.  - Allow mild permissive hypertension due to AKI  Acute on CKD (chronic kidney disease), stage II, Cr baseline of 1.9 -  Cr peaked at 3.06 on 3/23 and started trending down on 3/24 - Diuretics to be resumed  - No recent contrasted studies or NSAIDS - Renal US:  Bilateral renal cortical thinning.  Bilateral renal cysts - FENa - Consider nephrology consult if not improving tomorrow  Left renal cyst, lower pole with some complicating features 1.9cm -  Needs repeat renal US in 4-6 months  Anemia, iron deficiency anemia and probably a component of renal failure - iron deficiency - B12 and folate wnl - S/P one unit PRBC.  - Fecal occult blood not obtained - Needs outpatient GI evaluation.  - Increased to TID iron  CAD - History of coronary artery bypass grafting [facility] - Appears to be asymptomatic - Continue ASA, plavix, high dose statin - No BB due to bradycardia - No ACEI  due to AKI  Hypothyroidism -TSH in February greater than 10 with a free T4 of 1.02 -Review of patient's medication reconciliation sheet has documentation that patient not taking her Synthroid -Repeated TSH at 9.5  -Continue previously documented Synthroid 100 g daily -  Repeat TSH in 3-4 weeks   Dyslipidemia -Continue Lipitor   GERD  -Continue PPI and H2 blocker  Hyponatremia may be related to AKI and hypervolemia  Consultants:  Cardiology, Dr. Gala Romney  ID, Dr. Orvan Falconer  Procedures:  ECHO ordered.  Antibiotics:  Vancomycin 3-21 > 3/23  Linezolid 3/23 >> 4/1  Discharge Exam: Filed Vitals:   11/26/14 1105  BP: 181/52  Pulse: 61  Temp:   Resp: 18   Filed Vitals:   11/25/14 1515 11/25/14 2047 11/26/14 0434 11/26/14 1105  BP: 152/72 172/51 167/47 181/52  Pulse: 52 61 60 61  Temp: 98.4 F (36.9 C) 98 F (36.7 C) 97.1 F (36.2 C)   TempSrc: Oral Oral Axillary   Resp: 16 18 18 18   Height:  Weight:   65.8 kg (145 lb 1 oz)   SpO2: 99% 99% 99% 95%     General: Tearful adult female, No acute distress, stable from yesterday, lying in bed  HEENT: NCAT, MMM  Cardiovascular: RRR, nl S1, S2 no mrg, 2+ pulses, warm extremities  Respiratory: Rales at bilateral bases, no rhonchi or wheeze, no increased WOB  Abdomen: NABS, soft, NT/ND  MSK: Normal tone and bulk, no LEE  Neuro: Grossly intact  Discharge Instructions      Discharge Instructions    (HEART FAILURE PATIENTS) Call MD:  Anytime you have any of the following symptoms: 1) 3 pound weight gain in 24 hours or 5 pounds in 1 week 2) shortness of breath, with or without a dry hacking cough 3) swelling in the hands, feet or stomach 4) if you have to sleep on extra pillows at night in order to breathe.    Complete by:  As directed      Call MD for:  difficulty breathing, headache or visual disturbances    Complete by:  As directed      Call MD for:  extreme fatigue    Complete by:  As  directed      Call MD for:  hives    Complete by:  As directed      Call MD for:  persistant dizziness or light-headedness    Complete by:  As directed      Call MD for:  persistant nausea and vomiting    Complete by:  As directed      Call MD for:  severe uncontrolled pain    Complete by:  As directed      Call MD for:  temperature >100.4    Complete by:  As directed      Diet - low sodium heart healthy    Complete by:  As directed      Diet Carb Modified    Complete by:  As directed      Discharge instructions    Complete by:  As directed   You were hospitalized with heart failure.  You were given extra lasix and have a weight of 145-lbs currently.  Please take lasix 80mg  in the morning and 40mg  in the evening for now and have someone weigh you every day.  If you ever weigh more than 150-lbs, please increase lasix to 80mg  twice a day and call the cardiology office.  You will need to take antibiotic through April 1st.  Because of a drug-drug interaction with your zoloft, we have stopped your zoloft, but you can restart this medication on April 2nd.  We stopped your carvedilol because of slow heart rate and your insulin because of low blood sugars.  Once your blood sugars are consistently greater than 180, you may need to resume your insulin.  You were iron deficient and were started on iron supplementation.  Please talk to your doctor about investigating why you may be iron deficient.  If you develop fevers, chills, shorntess of breath, chest pains, increased weight or swelling, please seek immediate medical attention.     Increase activity slowly    Complete by:  As directed             Medication List    STOP taking these medications        aspirin 325 MG tablet  Replaced by:  aspirin 81 MG EC tablet     carvedilol 12.5 MG tablet  Commonly known as:  COREG     insulin aspart 100 UNIT/ML injection  Commonly known as:  novoLOG     LANTUS SOLOSTAR 100 UNIT/ML Solostar Pen   Generic drug:  Insulin Glargine     sertraline 100 MG tablet  Commonly known as:  ZOLOFT      TAKE these medications        amLODipine 10 MG tablet  Commonly known as:  NORVASC  Take 10 mg by mouth daily.     ARIPiprazole 5 MG tablet  Commonly known as:  ABILIFY  Take 5 mg by mouth daily.     aspirin 81 MG EC tablet  Take 1 tablet (81 mg total) by mouth daily.     atorvastatin 80 MG tablet  Commonly known as:  LIPITOR  Take 80 mg by mouth at bedtime.     clopidogrel 75 MG tablet  Commonly known as:  PLAVIX  Take 75 mg by mouth daily.     dorzolamide-timolol 22.3-6.8 MG/ML ophthalmic solution  Commonly known as:  COSOPT  Place 1 drop into both eyes every 12 (twelve) hours.     doxazosin 4 MG tablet  Commonly known as:  CARDURA  Take 1 tablet (4 mg total) by mouth daily.     ferrous sulfate 325 (65 FE) MG tablet  Take 1 tablet (325 mg total) by mouth 3 (three) times daily with meals.     furosemide 80 MG tablet  Commonly known as:  LASIX  Take 1 tab in the morning and half a tab in the evening     hydrALAZINE 100 MG tablet  Commonly known as:  APRESOLINE  Take 1 tablet (100 mg total) by mouth every 8 (eight) hours.     isosorbide dinitrate 10 MG tablet  Commonly known as:  ISORDIL  Take 2 tablets (20 mg total) by mouth 3 (three) times daily.     lansoprazole 30 MG capsule  Commonly known as:  PREVACID  Take 30 mg by mouth daily.     levothyroxine 100 MCG tablet  Commonly known as:  SYNTHROID, LEVOTHROID  Take 1 tablet (100 mcg total) by mouth daily before breakfast.     linezolid 600 MG tablet  Commonly known as:  ZYVOX  Take 1 tablet (600 mg total) by mouth every 12 (twelve) hours.     Melatonin 3 MG Tabs  Take 3 mg by mouth at bedtime.     nitroGLYCERIN 0.4 MG SL tablet  Commonly known as:  NITROSTAT  Place 0.4 mg under the tongue every 5 (five) minutes as needed for chest pain.     Polyethyl Glycol-Propyl Glycol 0.4-0.3 % Soln  Apply 1 drop to  eye 2 (two) times daily.     prednisoLONE acetate 1 % ophthalmic suspension  Commonly known as:  PRED FORTE  Place 1 drop into the left eye 4 (four) times daily.     ranitidine 150 MG tablet  Commonly known as:  ZANTAC  Take 150 mg by mouth 2 (two) times daily as needed for heartburn. indigestion     traMADol 50 MG tablet  Commonly known as:  ULTRAM  Take 1 tablet (50 mg total) by mouth every 6 (six) hours as needed for moderate pain.       Follow-up Information    Follow up with Arvilla Meres, MD On 12/07/2014.   Specialty:  Cardiology   Why:  at 2:40pm in the [facility]--Gate code [id number]--please bring all medications   Contact information:  [address] [place] [address] [phone]       Follow up with Primary care doctor In 3 weeks.       The results of significant diagnostics from this hospitalization (including imaging, microbiology, ancillary and laboratory) are listed below for reference.    Significant Diagnostic Studies: Dg Chest 2 View  11/22/2014   CLINICAL DATA:  Pt admitted 11/19/14 with recurrent CHF, uncontrolled HTN, bradycardia.  EXAM: CHEST  2 VIEW  COMPARISON:  11/20/2014 and 11/19/2014  FINDINGS: Interstitial thickening has resolved. There are only minimal residual effusions. No areas of lung consolidation.  Changes from CABG surgery are stable.  Mild stable cardiomegaly.  Lungs are mildly hyperexpanded.  IMPRESSION: Congestive heart failure appears resolved. There are minimal residual pleural effusions.   Electronically Signed   By: Amie Portland M.D.   On: 11/22/2014 11:49   Dg Chest 2 View  11/20/2014   CLINICAL DATA:  Shortness of breath, CHF, history of CABG  EXAM: CHEST  2 VIEW  COMPARISON:  AP and lateral chest x-ray of November 19, 2014  FINDINGS: The lungs remain hyperinflated. Bilateral pleural effusions have decreased in volume. The cardiac silhouette remains enlarged. The pulmonary vascularity remains  engorged. The pulmonary interstitial markings remain mildly increased. There are 5 intact sternal wires demonstrated. The bony thorax exhibits no acute abnormality.  IMPRESSION: Findings consistent with CHF superimposed upon COPD. Pulmonary edema persists. The bilateral pleural effusions are less prominent.   Electronically Signed   By: David  [place]   On: 11/20/2014 08:06   Dg Chest 2 View  11/19/2014   CLINICAL DATA:  69 year old female with increasing shortness of breath and weight gain. Clinical history includes CHF.  EXAM: CHEST  2 VIEW  COMPARISON:  Prior chest x-ray 10/31/2014  FINDINGS: Stable cardiomegaly and mediastinal contours. Patient is status post median sternotomy with evidence of prior multivessel CABG. Atherosclerotic calcification present in the transverse aorta. Similar degree of pulmonary vascular congestion without overt edema. Small bilateral layering pleural effusions are present and perhaps slightly enlarged compared to prior. There is associated bibasilar atelectasis. Small volume fluid knee tracks along the minor fissure. No focal airspace consolidation or pneumothorax. No acute osseous abnormality.  IMPRESSION: 1. Stable cardiomegaly and pulmonary vascular congestion without overt pulmonary edema. 2. Slight interval enlargement of bilateral layering pleural effusions. 3. Associated mild bibasilar atelectasis.   Electronically Signed   By: Malachy Moan M.D.   On: 11/19/2014 13:16   Dg Chest 2 View  10/31/2014   CLINICAL DATA:  Shortness of breath since yesterday.  EXAM: CHEST  2 VIEW  COMPARISON:  10/08/2014  FINDINGS: Cardiomegaly with vascular congestion. Small bilateral effusions. Continued mild interstitial prominence could reflect slight interstitial edema, not significantly changed since prior study. No acute bony abnormality.  IMPRESSION: Continued slight interstitial prominence could reflect interstitial edema. No real change. Small bilateral effusions.   Electronically  Signed   By: Charlett Nose M.D.   On: 10/31/2014 18:10   Ct Head Wo Contrast  10/31/2014   CLINICAL DATA:  Pt states she fell backwards, states walker flew out from under her.  EXAM: CT HEAD WITHOUT CONTRAST  CT CERVICAL SPINE WITHOUT CONTRAST  TECHNIQUE: Multidetector CT imaging of the head and cervical spine was performed following the standard protocol without intravenous contrast. Multiplanar CT image reconstructions of the cervical spine were also generated.  COMPARISON:  None.  FINDINGS: CT HEAD FINDINGS  No acute intracranial hemorrhage. No focal mass lesion. No CT  evidence of acute infarction. No midline shift or mass effect. No hydrocephalus. Basilar cisterns are patent.  There is periventricular white matter hypodensities. There is generalized cortical atrophy.  Paranasal sinuses and  mastoid air cells are clear.  CT CERVICAL SPINE FINDINGS  No prevertebral soft tissue swelling. Normal alignment of cervical vertebral bodies. No loss of vertebral body height. Normal facet articulation. Normal craniocervical junction.  There is joint space narrowing and endplate osteophytosis at C5-C6 and C6-7.  No evidence epidural or paraspinal hematoma.  IMPRESSION: 1. No intracranial trauma. 2. Atrophy and microvascular disease. 3. No cervical spine fracture. 4. Disc osteophytic disease of the lower cervical spine.  Also the CT head cervical   Electronically Signed   By: Genevive Bi M.D.   On: 10/31/2014 19:12   Ct Cervical Spine Wo Contrast  10/31/2014   CLINICAL DATA:  Pt states she fell backwards, states walker flew out from under her.  EXAM: CT HEAD WITHOUT CONTRAST  CT CERVICAL SPINE WITHOUT CONTRAST  TECHNIQUE: Multidetector CT imaging of the head and cervical spine was performed following the standard protocol without intravenous contrast. Multiplanar CT image reconstructions of the cervical spine were also generated.  COMPARISON:  None.  FINDINGS: CT HEAD FINDINGS  No acute intracranial hemorrhage. No  focal mass lesion. No CT evidence of acute infarction. No midline shift or mass effect. No hydrocephalus. Basilar cisterns are patent.  There is periventricular white matter hypodensities. There is generalized cortical atrophy.  Paranasal sinuses and  mastoid air cells are clear.  CT CERVICAL SPINE FINDINGS  No prevertebral soft tissue swelling. Normal alignment of cervical vertebral bodies. No loss of vertebral body height. Normal facet articulation. Normal craniocervical junction.  There is joint space narrowing and endplate osteophytosis at C5-C6 and C6-7.  No evidence epidural or paraspinal hematoma.  IMPRESSION: 1. No intracranial trauma. 2. Atrophy and microvascular disease. 3. No cervical spine fracture. 4. Disc osteophytic disease of the lower cervical spine.  Also the CT head cervical   Electronically Signed   By: Genevive Bi M.D.   On: 10/31/2014 19:12   US Renal  11/25/2014   CLINICAL DATA:  Acute kidney injury.  EXAM: RENAL/URINARY TRACT ULTRASOUND COMPLETE  COMPARISON:  None.  FINDINGS: Right Kidney:  Length: 10.2 cm. Normal parenchymal echogenicity. Mild renal cortical thinning. Exophytic cyst projects 2 midpole noted 2.4 cm. No other renal masses, no stones and no hydronephrosis.  Left Kidney:  Length: 11.0 cm. Normal parenchymal echogenicity. Mild renal cortical thinning. 19 mm cyst arises from the lower pole. Some questionable nodularity along its medial margin. No other complicating features. No other masses, no stones and no hydronephrosis.  Bladder:  Appears normal for degree of bladder distention.  IMPRESSION: 1. No hydronephrosis.  Normal parenchymal echogenicity. 2. Mild bilateral renal cortical thinning. 3. Bilateral renal cysts. Left renal cyst, arising from the lower pole, shows mildly complicating features. Recommend followup ultrasound in 4-6 months to document stability. If earlier characterization is desired clinically, renal MRI with and without contrast would be recommended.    Electronically Signed   By: Amie Portland M.D.   On: 11/25/2014 17:36    Microbiology: Recent Results (from the past 240 hour(s))  Culture, blood (routine x 2)     Status: None   Collection Time: 11/21/14  9:30 PM  Result Value Ref Range Status   Specimen Description BLOOD RIGHT ARM  Final   Special Requests BOTTLES DRAWN AEROBIC AND ANAEROBIC  Final   Culture  Final    METHICILLIN RESISTANT STAPHYLOCOCCUS AUREUS Note: RIFAMPIN AND GENTAMICIN SHOULD NOT BE USED AS SINGLE DRUGS FOR TREATMENT OF STAPH INFECTIONS. CRITICAL RESULT CALLED TO, READ BACK BY AND VERIFIED WITH: CHRIS LUCK 11/23/14 1515 BY SMITHERSJ This organism DOES NOT demonstrate inducible Clindamycin  resistance in vitro. Note: Gram Stain Report Called to,Read Back By and Verified With: MIKE S RN @955PM  [phone] [facility] Performed at [facility]    Report Status 11/24/2014 FINAL  Final   Organism ID, Bacteria METHICILLIN RESISTANT STAPHYLOCOCCUS AUREUS  Final      Susceptibility   Methicillin resistant staphylococcus aureus - MIC*    CLINDAMYCIN <=0.25 SENSITIVE Sensitive     ERYTHROMYCIN >=8 RESISTANT Resistant     GENTAMICIN <=0.5 SENSITIVE Sensitive     LEVOFLOXACIN 4 INTERMEDIATE Intermediate     OXACILLIN >=4 RESISTANT Resistant     PENICILLIN >=0.5 RESISTANT Resistant     RIFAMPIN <=0.5 SENSITIVE Sensitive     TRIMETH/SULFA <=10 SENSITIVE Sensitive     VANCOMYCIN <=0.5 SENSITIVE Sensitive     TETRACYCLINE <=1 SENSITIVE Sensitive     * METHICILLIN RESISTANT STAPHYLOCOCCUS AUREUS  Culture, blood (routine x 2)     Status: None   Collection Time: 11/21/14  9:40 PM  Result Value Ref Range Status   Specimen Description BLOOD RIGHT HAND  Final   Special Requests BOTTLES DRAWN AEROBIC AND ANAEROBIC  Final   Culture   Final    STAPHYLOCOCCUS AUREUS Note: SUSCEPTIBILITIES PERFORMED ON PREVIOUS CULTURE WITHIN THE LAST 5 DAYS. Note: Gram Stain Report Called to,Read Back By and Verified With: Lona Kettle RN on  11/23/14 at 02:30 by Christie Nottingham Performed at [facility]    Report Status 11/24/2014 FINAL  Final  Respiratory virus panel     Status: None   Collection Time: 11/21/14 10:00 PM  Result Value Ref Range Status   Respiratory Syncytial Virus A Negative Negative Final   Respiratory Syncytial Virus B Negative Negative Final   Influenza A Negative Negative Final   Influenza B Negative Negative Final   Parainfluenza 1 Negative Negative Final   Parainfluenza 2 Negative Negative Final   Parainfluenza 3 Negative Negative Final   Metapneumovirus Negative Negative Final   Rhinovirus Negative Negative Final   Adenovirus Negative Negative Final    Comment: (NOTE) Performed At: [facility] [address] [place], [place] [id number] Mila Homer MD BJ:[phone]   Culture, Urine     Status: None   Collection Time: 11/22/14  1:37 AM  Result Value Ref Range Status   Specimen Description URINE, RANDOM  Final   Special Requests NONE  Final   Colony Count NO GROWTH Performed at [facility]   Final   Culture NO GROWTH Performed at [facility]   Final   Report Status 11/23/2014 FINAL  Final  Culture, blood (routine x 2)     Status: None (Preliminary result)   Collection Time: 11/23/14  3:20 PM  Result Value Ref Range Status   Specimen Description BLOOD RIGHT ARM  Final   Special Requests BOTTLES DRAWN AEROBIC AND ANAEROBIC 10CC  Final   Culture   Final           BLOOD CULTURE RECEIVED NO GROWTH TO DATE CULTURE WILL BE HELD FOR 5 DAYS BEFORE ISSUING A FINAL NEGATIVE REPORT Performed at [facility]    Report Status PENDING  Incomplete  Culture, blood (routine x 2)     Status: None (Preliminary  result)   Collection Time: 11/23/14  3:30 PM  Result Value Ref Range Status   Specimen Description BLOOD RIGHT HAND  Final   Special Requests BOTTLES DRAWN AEROBIC ONLY 7CC  Final   Culture   Final           BLOOD CULTURE RECEIVED NO GROWTH TO DATE CULTURE  WILL BE HELD FOR 5 DAYS BEFORE ISSUING A FINAL NEGATIVE REPORT Performed at [facility]    Report Status PENDING  Incomplete     Labs: Basic Metabolic Panel:  Recent Labs Lab 11/22/14 0634 11/23/14 0619 11/24/14 0332 11/25/14 0425 11/26/14 0515  NA 134* 131* 131* 130* 135  K 3.5 3.7 3.4* 4.5 4.0  CL 95* 93* 93* 95* 98  CO2 30 27 28 26 27   GLUCOSE 137* 126* 129* 120* 133*  BUN 30* 38* 46* 51* 51*  CREATININE 2.29* 2.80* 2.94* 3.06* 2.69*  CALCIUM 8.6 8.3* 8.2* 8.0* 8.5   Liver Function Tests: No results for input(s): AST, ALT, ALKPHOS, BILITOT, PROT, ALBUMIN in the last 168 hours. No results for input(s): LIPASE, AMYLASE in the last 168 hours. No results for input(s): AMMONIA in the last 168 hours. CBC:  Recent Labs Lab 11/20/14 0334 11/21/14 0358 11/23/14 0619 11/25/14 0425  WBC  --  6.8 8.6 6.5  HGB 9.6* 9.3* 9.2* 8.2*  HCT 30.5* 30.1* 28.9* 25.8*  MCV  --  90.1 88.4 87.8  PLT  --  298 211 186   Cardiac Enzymes: No results for input(s): CKTOTAL, CKMB, CKMBINDEX, TROPONINI in the last 168 hours. BNP: BNP (last 3 results)  Recent Labs  10/07/14 1327 10/31/14 1645 11/19/14 1215  BNP 2584.7* 1931.3* 2140.8*    ProBNP (last 3 results) No results for input(s): PROBNP in the last 8760 hours.  CBG:  Recent Labs Lab 11/25/14 1130 11/25/14 1629 11/25/14 2107 11/26/14 0557 11/26/14 1220  GLUCAP 154* 134* 154* 121* 148*    Time coordinating discharge: 35 minutes  Signed:  Treyvonne Tata  [facility] 11/26/2014, 12:29 PM

## 2014-11-26 NOTE — Progress Notes (Addendum)
CSW (Clinical Child psychotherapistocial Worker) sent completed FL2 with dc meds to facility. Awaiting return phone call to confirm discharge with facility. Also awaiting phone call to confirm if facility can provide transportation.   ADDENDUM 3:50pm: CSW spoke with Shawna at facility to confirm discharge she informed CSW she is reviewing information . CSW also spoke with pt and pt sister-in-law Victorino DikeJennifer. Victorino DikeJennifer to provide transportation for pt at 4:30pm. CSW called facility back to notify of time of transport requesting confirmation call that FL2 is okay for discharge.  ADDENDUM 5:02pm: Pt family arrived to pick pt up and transport back to facility. No one answering facility phone at this time. CSW left voicemail on after hours line to notify of pt discharge.   Kofi Murrell, LCSWA [phone]

## 2014-11-27 ENCOUNTER — Telehealth (HOSPITAL_COMMUNITY): Payer: Self-pay | Admitting: Adult Health

## 2014-11-27 NOTE — Telephone Encounter (Signed)
Clarification was provided to [facility] regarding diuretic regimen.   Instructed to take lasix 80 mg in am and 40 mg in pm but if weight 150 pounds or greater to take an additional 40 mg of lasix in the evening.    I have asked that the HF clinic get a phone call if she requires additional lasix for 3 days in a row.   Joyce GrossShawn Smith RN verbalized understanding and appreciated order clarification.   CLEGG,AMY NP-C  2:37 PM

## 2014-11-29 LAB — CULTURE, BLOOD (ROUTINE X 2)
Culture: NO GROWTH
Culture: NO GROWTH

## 2014-12-07 ENCOUNTER — Encounter (HOSPITAL_COMMUNITY): Payer: Self-pay

## 2014-12-07 ENCOUNTER — Ambulatory Visit (HOSPITAL_COMMUNITY)
Admission: RE | Admit: 2014-12-07 | Discharge: 2014-12-07 | Disposition: A | Discharge status: Home / Self Care | Payer: Medicare PPO | Source: Ambulatory Visit | Attending: Cardiology | Admitting: Cardiology

## 2014-12-07 VITALS — BP 128/62 | HR 80 | Wt 149.5 lb

## 2014-12-07 DIAGNOSIS — Z951 Presence of aortocoronary bypass graft: Secondary | ICD-10-CM | POA: Insufficient documentation

## 2014-12-07 DIAGNOSIS — I5033 Acute on chronic diastolic (congestive) heart failure: Secondary | ICD-10-CM

## 2014-12-07 DIAGNOSIS — N189 Chronic kidney disease, unspecified: Secondary | ICD-10-CM | POA: Insufficient documentation

## 2014-12-07 DIAGNOSIS — I120 Hypertensive chronic kidney disease with stage 5 chronic kidney disease or end stage renal disease: Secondary | ICD-10-CM | POA: Diagnosis not present

## 2014-12-07 DIAGNOSIS — I5032 Chronic diastolic (congestive) heart failure: Secondary | ICD-10-CM | POA: Diagnosis present

## 2014-12-07 DIAGNOSIS — E039 Hypothyroidism, unspecified: Secondary | ICD-10-CM | POA: Insufficient documentation

## 2014-12-07 DIAGNOSIS — E785 Hyperlipidemia, unspecified: Secondary | ICD-10-CM | POA: Insufficient documentation

## 2014-12-07 DIAGNOSIS — Z7982 Long term (current) use of aspirin: Secondary | ICD-10-CM | POA: Insufficient documentation

## 2014-12-07 DIAGNOSIS — H548 Legal blindness, as defined in USA: Secondary | ICD-10-CM | POA: Diagnosis not present

## 2014-12-07 DIAGNOSIS — N182 Chronic kidney disease, stage 2 (mild): Secondary | ICD-10-CM

## 2014-12-07 DIAGNOSIS — K219 Gastro-esophageal reflux disease without esophagitis: Secondary | ICD-10-CM | POA: Insufficient documentation

## 2014-12-07 DIAGNOSIS — N179 Acute kidney failure, unspecified: Secondary | ICD-10-CM | POA: Diagnosis not present

## 2014-12-07 DIAGNOSIS — E119 Type 2 diabetes mellitus without complications: Secondary | ICD-10-CM | POA: Insufficient documentation

## 2014-12-07 DIAGNOSIS — I2510 Atherosclerotic heart disease of native coronary artery without angina pectoris: Secondary | ICD-10-CM

## 2014-12-07 LAB — BASIC METABOLIC PANEL WITH GFR
Anion gap: 12 (ref 5–15)
BUN: 28 mg/dL — ABNORMAL HIGH (ref 6–23)
CO2: 24 mmol/L (ref 19–32)
Calcium: 8.8 mg/dL (ref 8.4–10.5)
Chloride: 95 mmol/L — ABNORMAL LOW (ref 96–112)
Creatinine, Ser: 1.99 mg/dL — ABNORMAL HIGH (ref 0.50–1.10)
GFR calc Af Amer: 28 mL/min — ABNORMAL LOW
GFR calc non Af Amer: 24 mL/min — ABNORMAL LOW
Glucose, Bld: 147 mg/dL — ABNORMAL HIGH (ref 70–99)
Potassium: 3.8 mmol/L (ref 3.5–5.1)
Sodium: 131 mmol/L — ABNORMAL LOW (ref 135–145)

## 2014-12-07 LAB — BRAIN NATRIURETIC PEPTIDE: B NATRIURETIC PEPTIDE 5: 162.9 pg/mL — AB (ref 0.0–100.0)

## 2014-12-07 NOTE — Patient Instructions (Signed)
PLEASE check WEIGHT daily.  LABS TODAY.  FOLLOW UP in 3 weeks.

## 2014-12-08 LAB — PROTEIN ELECTROPHORESIS, SERUM
A/G Ratio: 0.9 (ref 0.7–2.0)
ALBUMIN ELP: 3.3 g/dL (ref 3.2–5.6)
ALPHA-1-GLOBULIN: 0.3 g/dL (ref 0.1–0.4)
Alpha-2-Globulin: 0.9 g/dL (ref 0.4–1.2)
Beta Globulin: 0.8 g/dL (ref 0.6–1.3)
GLOBULIN, TOTAL: 3.6 g/dL (ref 2.0–4.5)
Gamma Globulin: 1.7 g/dL — ABNORMAL HIGH (ref 0.5–1.6)
M-Spike, %: 0.6 g/dL — ABNORMAL HIGH
Total Protein ELP: 6.9 g/dL (ref 6.0–8.5)

## 2014-12-08 NOTE — Progress Notes (Signed)
Patient ID: Taylor Padilla, female   DOB: March 22, 1946, 69 y.o.   MRN: [id number] 69 yo with history of CAD s/p CABG, chronic diastolic CHF, CKD, blindness presents for cardiology followup after recent hospitalization.  She has had 3 recent admissions with acute on chronic diastolic CHF, most recently 11/19/14.  This last time, she was admitted and diuresed 20 lbs.  She had a fever and blood cultures showed MRSA.  No source was found and she was treated with a course of linezolid.    She is now back at her assisted living.  She is legally blind so not walking much.  She is scared of falling.  She uses a walker to get to meals.  She currently denies dyspnea walking to the dining room with her walker.  No orthopnea or PND.  No chest pain.  No lightheadedness.  Weight stable since discharge.  She has a history of sinus bradycardia, now off all nodal blockers with HR in 80s today.   Labs (3/16): K 4, creatinine 2.69  PMH: 1. GERD 2. Hyperlipidemia 3. HTN: Renal artery dopplers 2/16 did not show significant renal artery stenosis.  4. Type II diabetes 5. Glaucoma: Legally blind. 6. CKD 7. Anemia 8. Hypothyroidism 9. CAD: CABG x 4 2003 in [place].  10. Chronic diastolic CHF: Echo (2/16) with EF 60-65%, restrictive diastolic function, moderate MR, severe LAE, mildly dilated RV with normal systolic function, PA systolic pressure 48 mmHg.  11. Sinus bradycardia 12. CVA  SH: Nonsmoker, widow, lives in assisted living in [place].   FH: No premature CAD.   ROS: All systems reviewed and negative except as per HPI.   Current Outpatient Prescriptions  Medication Sig Dispense Refill  . amLODipine (NORVASC) 10 MG tablet Take 10 mg by mouth daily.    . ARIPiprazole (ABILIFY) 5 MG tablet Take 5 mg by mouth daily.    Marland Kitchen aspirin EC 81 MG EC tablet Take 1 tablet (81 mg total) by mouth daily. 30 tablet 0  . atorvastatin (LIPITOR) 80 MG tablet Take 80 mg by mouth at bedtime.    . clopidogrel (PLAVIX) 75 MG  tablet Take 75 mg by mouth daily.    . dorzolamide-timolol (COSOPT) 22.3-6.8 MG/ML ophthalmic solution Place 1 drop into both eyes every 12 (twelve) hours.    Marland Kitchen doxazosin (CARDURA) 4 MG tablet Take 1 tablet (4 mg total) by mouth daily. 30 tablet 0  . ferrous sulfate 325 (65 FE) MG tablet Take 1 tablet (325 mg total) by mouth 3 (three) times daily with meals. 90 tablet 0  . furosemide (LASIX) 80 MG tablet Take 1 tab in the morning and half a tab in the evening 60 tablet 0  . hydrALAZINE (APRESOLINE) 100 MG tablet Take 1 tablet (100 mg total) by mouth every 8 (eight) hours. 90 tablet 0  . isosorbide dinitrate (ISORDIL) 10 MG tablet Take 2 tablets (20 mg total) by mouth 3 (three) times daily.    . lansoprazole (PREVACID) 30 MG capsule Take 30 mg by mouth daily.    Marland Kitchen levothyroxine (SYNTHROID, LEVOTHROID) 100 MCG tablet Take 1 tablet (100 mcg total) by mouth daily before breakfast. 30 tablet 0  . linezolid (ZYVOX) 600 MG tablet Take 1 tablet (600 mg total) by mouth every 12 (twelve) hours. 18 tablet 0  . Melatonin 3 MG TABS Take 3 mg by mouth at bedtime.    . nitroGLYCERIN (NITROSTAT) 0.4 MG SL tablet Place 0.4 mg under the tongue every 5 (five) minutes as needed  for chest pain.    Bertram Gala. Polyethyl Glycol-Propyl Glycol 0.4-0.3 % SOLN Apply 1 drop to eye 2 (two) times daily.    . prednisoLONE acetate (PRED FORTE) 1 % ophthalmic suspension Place 1 drop into the left eye 4 (four) times daily.    . ranitidine (ZANTAC) 150 MG tablet Take 150 mg by mouth 2 (two) times daily as needed for heartburn. indigestion    . traMADol (ULTRAM) 50 MG tablet Take 1 tablet (50 mg total) by mouth every 6 (six) hours as needed for moderate pain. 30 tablet 0   No current facility-administered medications for this encounter.   BP 128/62 mmHg  Pulse 80  Wt 149 lb 8 oz (67.813 kg)  SpO2 99% General: NAD Neck: No JVD, no thyromegaly or thyroid nodule.  Lungs: Slight crackles at bases bilaterally.  CV: Nondisplaced PMI.  Heart  regular S1/S2, no S3/S4, 2/6 HSM at apex.  1+ ankle edema.  No carotid bruit.  Normal pedal pulses.  Abdomen: Soft, nontender, no hepatosplenomegaly, no distention.  Skin: Intact without lesions or rashes.  Neurologic: Alert and oriented x 3.  Psych: Normal affect. Extremities: No clubbing or cyanosis.  HEENT: Normal.   Assessment/Plan: 1. Chronic diastolic CHF: She is currently on Lasix 80 qam/40 qpm and does not appear significantly volume overloaded.  Not very active but seems NYHA class II-III.   - Continue current diuretics.   - Check BMET/BNP today.  - Some question of possible restrictive cardiomyopathy, will check SPEP/UPEP.  - Check weights daily at assisted living.  2. Sinus bradycardia: HR in 80s off nodal blockers.  3. CKD: BMET today. Should have nephrology followup.  4. CAD: S/p CABG.  No chest pain episodes.  Overall doing better with diuresis, think we can hold off on stress test for now.  Continue ASA 81, Plavix 75, atorvastatin 80.   5. HTN: BP is well-controlled on current regimen, continue.  6. Hyperlipidemia: Continue statin, will need to check fasting lipids next appt.   Followup 3 wks.  Needs PCP.   Marca AnconaDalton Alin Chavira 12/08/2014

## 2014-12-31 ENCOUNTER — Ambulatory Visit (HOSPITAL_COMMUNITY)
Admission: RE | Admit: 2014-12-31 | Discharge: 2014-12-31 | Disposition: A | Discharge status: Home / Self Care | Payer: Medicare PPO | Source: Ambulatory Visit | Attending: Internal Medicine | Admitting: Internal Medicine

## 2014-12-31 VITALS — BP 158/62 | HR 63 | Wt 146.0 lb

## 2014-12-31 DIAGNOSIS — I129 Hypertensive chronic kidney disease with stage 1 through stage 4 chronic kidney disease, or unspecified chronic kidney disease: Secondary | ICD-10-CM | POA: Insufficient documentation

## 2014-12-31 DIAGNOSIS — Z8673 Personal history of transient ischemic attack (TIA), and cerebral infarction without residual deficits: Secondary | ICD-10-CM | POA: Insufficient documentation

## 2014-12-31 DIAGNOSIS — N189 Chronic kidney disease, unspecified: Secondary | ICD-10-CM | POA: Insufficient documentation

## 2014-12-31 DIAGNOSIS — Z7982 Long term (current) use of aspirin: Secondary | ICD-10-CM | POA: Diagnosis not present

## 2014-12-31 DIAGNOSIS — E039 Hypothyroidism, unspecified: Secondary | ICD-10-CM | POA: Insufficient documentation

## 2014-12-31 DIAGNOSIS — Z951 Presence of aortocoronary bypass graft: Secondary | ICD-10-CM | POA: Insufficient documentation

## 2014-12-31 DIAGNOSIS — H548 Legal blindness, as defined in USA: Secondary | ICD-10-CM | POA: Insufficient documentation

## 2014-12-31 DIAGNOSIS — Z79899 Other long term (current) drug therapy: Secondary | ICD-10-CM | POA: Insufficient documentation

## 2014-12-31 DIAGNOSIS — E785 Hyperlipidemia, unspecified: Secondary | ICD-10-CM | POA: Insufficient documentation

## 2014-12-31 DIAGNOSIS — K219 Gastro-esophageal reflux disease without esophagitis: Secondary | ICD-10-CM | POA: Insufficient documentation

## 2014-12-31 DIAGNOSIS — I5033 Acute on chronic diastolic (congestive) heart failure: Secondary | ICD-10-CM

## 2014-12-31 DIAGNOSIS — Z7902 Long term (current) use of antithrombotics/antiplatelets: Secondary | ICD-10-CM | POA: Insufficient documentation

## 2014-12-31 DIAGNOSIS — I2510 Atherosclerotic heart disease of native coronary artery without angina pectoris: Secondary | ICD-10-CM | POA: Insufficient documentation

## 2014-12-31 DIAGNOSIS — R001 Bradycardia, unspecified: Secondary | ICD-10-CM | POA: Insufficient documentation

## 2014-12-31 DIAGNOSIS — E119 Type 2 diabetes mellitus without complications: Secondary | ICD-10-CM | POA: Diagnosis not present

## 2014-12-31 DIAGNOSIS — I159 Secondary hypertension, unspecified: Secondary | ICD-10-CM

## 2014-12-31 DIAGNOSIS — I5032 Chronic diastolic (congestive) heart failure: Secondary | ICD-10-CM | POA: Diagnosis not present

## 2014-12-31 NOTE — Patient Instructions (Signed)
Follow up 6 weeks.  Do the following things EVERYDAY: 1) Weigh yourself in the morning before breakfast. Write it down and keep it in a log. 2) Take your medicines as prescribed 3) Eat low salt foods-Limit salt (sodium) to 2000 mg per day.  4) Stay as active as you can everyday 5) Limit all fluids for the day to less than 2 liters  

## 2014-12-31 NOTE — Progress Notes (Signed)
Patient ID: Taylor [name], female   DOB: [birth year], 69 y.o.   MRN: [id number] 69 yo with history of CAD s/p CABG, chronic diastolic CHF, CKD, and cardiology followup after recent hospitalization.  She has had 3 recent admissions with acute on chronic diastolic CHF, most recently 11/19/14.  This last time, she was admitted and diuresed 20 lbs.  She had a fever and blood cultures showed MRSA.  No source was found and she was treated with a course of linezolid.    She is now back at her assisted living.  She is legally blind. Weight at home 146-150 pounds. She uses a walker to get to meals. Says she walks a lot during the day.   Denies SOB/PND/Orthopnea. No chest pain.  No lightheadedness.  She has a history of sinus bradycardia, now off all nodal blockers with HR in 80s today.   Labs (3/16): K 4, creatinine 2.69 Labs (12/07/2014) SPEP M Spike noted   PMH: 1. GERD 2. Hyperlipidemia 3. HTN: Renal artery dopplers 2/16 did not show significant renal artery stenosis.  4. Type II diabetes 5. Glaucoma: Legally blind. 6. CKD 7. Anemia 8. Hypothyroidism 9. CAD: CABG x 4 2003 in [place].  10. Chronic diastolic CHF: Echo (2/16) with EF 60-65%, restrictive diastolic function, moderate MR, severe LAE, mildly dilated RV with normal systolic function, PA systolic pressure 48 mmHg.  11. Sinus bradycardia 12. CVA  SH: Nonsmoker, widow, [place] in assisted living [place].   FH: No premature CAD.   ROS: All systems reviewed and negative except as per HPI.   Current Outpatient Prescriptions  Medication Sig Dispense Refill  . amLODipine (NORVASC) 10 MG tablet Take 10 mg by mouth daily.    . ARIPiprazole (ABILIFY) 5 MG tablet Take 5 mg by mouth daily.    Marland Kitchen. aspirin EC 81 MG EC tablet Take 1 tablet (81 mg total) by mouth daily. 30 tablet 0  . atorvastatin (LIPITOR) 80 MG tablet Take 80 mg by mouth at bedtime.    . clopidogrel (PLAVIX) 75 MG tablet Take 75 mg by mouth daily.    . dorzolamide-timolol  (COSOPT) 22.3-6.8 MG/ML ophthalmic solution Place 1 drop into both eyes every 12 (twelve) hours.    Marland Kitchen. doxazosin (CARDURA) 4 MG tablet Take 1 tablet (4 mg total) by mouth daily. 30 tablet 0  . ferrous sulfate 325 (65 FE) MG tablet Take 1 tablet (325 mg total) by mouth 3 (three) times daily with meals. 90 tablet 0  . furosemide (LASIX) 80 MG tablet Take 1 tab in the morning and half a tab in the evening 60 tablet 0  . hydrALAZINE (APRESOLINE) 100 MG tablet Take 1 tablet (100 mg total) by mouth every 8 (eight) hours. 90 tablet 0  . isosorbide dinitrate (ISORDIL) 10 MG tablet Take 2 tablets (20 mg total) by mouth 3 (three) times daily.    . lansoprazole (PREVACID) 30 MG capsule Take 30 mg by mouth daily.    Marland Kitchen. levothyroxine (SYNTHROID, LEVOTHROID) 100 MCG tablet Take 1 tablet (100 mcg total) by mouth daily before breakfast. 30 tablet 0  . Melatonin 3 MG TABS Take 3 mg by mouth at bedtime.    . nitroGLYCERIN (NITROSTAT) 0.4 MG SL tablet Place 0.4 mg under the tongue every 5 (five) minutes as needed for chest pain.    Bertram Gala. Polyethyl Glycol-Propyl Glycol 0.4-0.3 % SOLN Apply 1 drop to eye 2 (two) times daily.    . prednisoLONE acetate (PRED FORTE) 1 % ophthalmic suspension Place  1 drop into the left eye 4 (four) times daily.    . ranitidine (ZANTAC) 150 MG tablet Take 150 mg by mouth 2 (two) times daily as needed for heartburn. indigestion    . traMADol (ULTRAM) 50 MG tablet Take 1 tablet (50 mg total) by mouth every 6 (six) hours as needed for moderate pain. 30 tablet 0   No current facility-administered medications for this encounter.   BP 158/62 mmHg  Pulse 63  Wt 146 lb (66.225 kg)  SpO2 97% General: NAD Ambulated to clinic with a roling walker.  Neck: No JVD, no thyromegaly or thyroid nodule.  Lungs: Clear.  CV: Nondisplaced PMI.  Heart regular S1/S2, no S3/S4, 2/6 HSM at apex.  No edema.  No carotid bruit.  Normal pedal pulses.  Abdomen: Soft, nontender, no hepatosplenomegaly, no distention.   Skin: Intact without lesions or rashes.  Neurologic: Alert and oriented x 3.  Psych: Normal affect. Extremities: No clubbing or cyanosis.  HEENT: Normal.   Assessment/Plan: 1. Chronic diastolic CHF: She is currently on Lasix 80 qam/40 qpm and does not appear significantly volume overloaded.  NYHA class II-III.  Continue current diuretics.   - Some question of possible restrictive cardiomyopathy. M spike noted on SPEP Check serum immunofixation today.   - Check weights daily at assisted living.  2. Sinus bradycardia: HR in 80s off nodal blockers.  3. CKD:  Should have nephrology followup.  4. CAD: S/p CABG.  No chest pain episodes.  Continue ASA 81, Plavix 75, atorvastatin 80.   5. HTN: BP elevated but says it happens a MD appointments.   6. Hyperlipidemia: Continue statin, will need to check fasting lipids today.    Follow up 6 wks.     CLEGG,AMY  NP-C   12/31/2014

## 2015-02-11 ENCOUNTER — Encounter (HOSPITAL_COMMUNITY): Payer: Self-pay

## 2015-02-17 ENCOUNTER — Telehealth (HOSPITAL_COMMUNITY): Payer: Self-pay | Admitting: Cardiology

## 2015-02-17 NOTE — Telephone Encounter (Signed)
Called to inform CHF clinic Pt is currently in facility and pt is refusing to weigh daily Staff has orders to weigh patient daily and she refuses  Last weight at facility 02/12/15- 149.6 Weight today 02/17/15 -147.5\   Kerri, Rn re-educated patient the importance of daily weight checks and heart failure management

## 2015-03-02 ENCOUNTER — Emergency Department (HOSPITAL_COMMUNITY): Payer: Medicare PPO

## 2015-03-02 ENCOUNTER — Observation Stay (HOSPITAL_COMMUNITY)
Admission: EM | Admit: 2015-03-02 | Discharge: 2015-03-04 | Disposition: A | Discharge status: Skilled Nursing Facility | Payer: Medicare PPO | Attending: Internal Medicine | Admitting: Internal Medicine

## 2015-03-02 ENCOUNTER — Encounter (HOSPITAL_COMMUNITY): Payer: Self-pay

## 2015-03-02 DIAGNOSIS — E039 Hypothyroidism, unspecified: Secondary | ICD-10-CM | POA: Diagnosis present

## 2015-03-02 DIAGNOSIS — I5032 Chronic diastolic (congestive) heart failure: Secondary | ICD-10-CM | POA: Diagnosis not present

## 2015-03-02 DIAGNOSIS — E876 Hypokalemia: Secondary | ICD-10-CM | POA: Diagnosis not present

## 2015-03-02 DIAGNOSIS — E119 Type 2 diabetes mellitus without complications: Secondary | ICD-10-CM | POA: Insufficient documentation

## 2015-03-02 DIAGNOSIS — M79606 Pain in leg, unspecified: Secondary | ICD-10-CM | POA: Diagnosis present

## 2015-03-02 DIAGNOSIS — Z79899 Other long term (current) drug therapy: Secondary | ICD-10-CM | POA: Insufficient documentation

## 2015-03-02 DIAGNOSIS — E1121 Type 2 diabetes mellitus with diabetic nephropathy: Secondary | ICD-10-CM | POA: Diagnosis present

## 2015-03-02 DIAGNOSIS — D649 Anemia, unspecified: Secondary | ICD-10-CM | POA: Diagnosis not present

## 2015-03-02 DIAGNOSIS — Z7902 Long term (current) use of antithrombotics/antiplatelets: Secondary | ICD-10-CM | POA: Insufficient documentation

## 2015-03-02 DIAGNOSIS — W19XXXA Unspecified fall, initial encounter: Secondary | ICD-10-CM | POA: Insufficient documentation

## 2015-03-02 DIAGNOSIS — S32401A Unspecified fracture of right acetabulum, initial encounter for closed fracture: Secondary | ICD-10-CM | POA: Diagnosis not present

## 2015-03-02 DIAGNOSIS — Z886 Allergy status to analgesic agent status: Secondary | ICD-10-CM | POA: Diagnosis not present

## 2015-03-02 DIAGNOSIS — R001 Bradycardia, unspecified: Secondary | ICD-10-CM | POA: Diagnosis not present

## 2015-03-02 DIAGNOSIS — E871 Hypo-osmolality and hyponatremia: Secondary | ICD-10-CM | POA: Diagnosis not present

## 2015-03-02 DIAGNOSIS — I129 Hypertensive chronic kidney disease with stage 1 through stage 4 chronic kidney disease, or unspecified chronic kidney disease: Secondary | ICD-10-CM | POA: Insufficient documentation

## 2015-03-02 DIAGNOSIS — H540 Blindness, both eyes: Secondary | ICD-10-CM | POA: Diagnosis not present

## 2015-03-02 DIAGNOSIS — H409 Unspecified glaucoma: Secondary | ICD-10-CM | POA: Diagnosis not present

## 2015-03-02 DIAGNOSIS — N390 Urinary tract infection, site not specified: Secondary | ICD-10-CM | POA: Diagnosis not present

## 2015-03-02 DIAGNOSIS — Y929 Unspecified place or not applicable: Secondary | ICD-10-CM | POA: Diagnosis not present

## 2015-03-02 DIAGNOSIS — S72001A Fracture of unspecified part of neck of right femur, initial encounter for closed fracture: Secondary | ICD-10-CM | POA: Diagnosis not present

## 2015-03-02 DIAGNOSIS — N183 Chronic kidney disease, stage 3 (moderate): Secondary | ICD-10-CM | POA: Diagnosis not present

## 2015-03-02 DIAGNOSIS — Z8673 Personal history of transient ischemic attack (TIA), and cerebral infarction without residual deficits: Secondary | ICD-10-CM | POA: Diagnosis not present

## 2015-03-02 DIAGNOSIS — I2510 Atherosclerotic heart disease of native coronary artery without angina pectoris: Secondary | ICD-10-CM | POA: Diagnosis present

## 2015-03-02 DIAGNOSIS — Z7982 Long term (current) use of aspirin: Secondary | ICD-10-CM | POA: Diagnosis not present

## 2015-03-02 DIAGNOSIS — Z794 Long term (current) use of insulin: Secondary | ICD-10-CM | POA: Diagnosis present

## 2015-03-02 DIAGNOSIS — I252 Old myocardial infarction: Secondary | ICD-10-CM | POA: Diagnosis not present

## 2015-03-02 LAB — CBG MONITORING, ED: Glucose-Capillary: 272 mg/dL — ABNORMAL HIGH (ref 65–99)

## 2015-03-02 LAB — BASIC METABOLIC PANEL
Anion gap: 14 (ref 5–15)
BUN: 38 mg/dL — ABNORMAL HIGH (ref 6–20)
CHLORIDE: 93 mmol/L — AB (ref 101–111)
CO2: 26 mmol/L (ref 22–32)
Calcium: 8.9 mg/dL (ref 8.9–10.3)
Creatinine, Ser: 2.1 mg/dL — ABNORMAL HIGH (ref 0.44–1.00)
GFR calc non Af Amer: 23 mL/min — ABNORMAL LOW (ref >60)
GFR, EST AFRICAN AMERICAN: 26 mL/min — AB (ref >60)
Glucose, Bld: 247 mg/dL — ABNORMAL HIGH (ref 65–99)
Potassium: 2.8 mmol/L — ABNORMAL LOW (ref 3.5–5.1)
SODIUM: 133 mmol/L — AB (ref 135–145)

## 2015-03-02 LAB — URINE MICROSCOPIC-ADD ON

## 2015-03-02 LAB — CBC WITH DIFFERENTIAL/PLATELET
BASOS ABS: 0 10*3/uL (ref 0.0–0.1)
Basophils Relative: 0 % (ref 0–1)
EOS ABS: 0.2 10*3/uL (ref 0.0–0.7)
EOS PCT: 2 % (ref 0–5)
HCT: 34.6 % — ABNORMAL LOW (ref 36.0–46.0)
Hemoglobin: 11.2 g/dL — ABNORMAL LOW (ref 12.0–15.0)
Lymphocytes Relative: 9 % — ABNORMAL LOW (ref 12–46)
Lymphs Abs: 0.7 10*3/uL (ref 0.7–4.0)
MCH: 29.4 pg (ref 26.0–34.0)
MCHC: 32.4 g/dL (ref 30.0–36.0)
MCV: 90.8 fL (ref 78.0–100.0)
Monocytes Absolute: 0.4 10*3/uL (ref 0.1–1.0)
Monocytes Relative: 5 % (ref 3–12)
Neutro Abs: 7 10*3/uL (ref 1.7–7.7)
Neutrophils Relative %: 84 % — ABNORMAL HIGH (ref 43–77)
PLATELETS: 302 10*3/uL (ref 150–400)
RBC: 3.81 MIL/uL — ABNORMAL LOW (ref 3.87–5.11)
RDW: 14.3 % (ref 11.5–15.5)
WBC: 8.3 10*3/uL (ref 4.0–10.5)

## 2015-03-02 LAB — URINALYSIS, ROUTINE W REFLEX MICROSCOPIC
Bilirubin Urine: NEGATIVE
Glucose, UA: NEGATIVE mg/dL
HGB URINE DIPSTICK: NEGATIVE
KETONES UR: NEGATIVE mg/dL
Nitrite: NEGATIVE
PROTEIN: 100 mg/dL — AB
Specific Gravity, Urine: 1.01 (ref 1.005–1.030)
UROBILINOGEN UA: 0.2 mg/dL (ref 0.0–1.0)
pH: 5.5 (ref 5.0–8.0)

## 2015-03-02 LAB — MAGNESIUM: Magnesium: 2 mg/dL (ref 1.7–2.4)

## 2015-03-02 LAB — GLUCOSE, CAPILLARY: GLUCOSE-CAPILLARY: 207 mg/dL — AB (ref 65–99)

## 2015-03-02 MED ORDER — CEFTRIAXONE SODIUM IN DEXTROSE 20 MG/ML IV SOLN
1.0000 g | INTRAVENOUS | Status: DC
  Administered 2015-03-02 – 2015-03-03 (×2): 1 g via INTRAVENOUS
  Filled 2015-03-02 (×2): qty 50

## 2015-03-02 MED ORDER — POTASSIUM CHLORIDE CRYS ER 20 MEQ PO TBCR
40.0000 meq | EXTENDED_RELEASE_TABLET | Freq: Two times a day (BID) | ORAL | Status: AC
  Administered 2015-03-02 – 2015-03-03 (×2): 40 meq via ORAL
  Filled 2015-03-02 (×2): qty 2

## 2015-03-02 MED ORDER — MORPHINE SULFATE 2 MG/ML IJ SOLN
1.0000 mg | INTRAMUSCULAR | Status: DC | PRN
  Administered 2015-03-02: 1 mg via INTRAVENOUS
  Administered 2015-03-02: 2 mg via INTRAVENOUS
  Administered 2015-03-03: 1 mg via INTRAVENOUS
  Filled 2015-03-02 (×4): qty 1

## 2015-03-02 MED ORDER — DOXAZOSIN MESYLATE 4 MG PO TABS
4.0000 mg | ORAL_TABLET | Freq: Every day | ORAL | Status: DC
  Administered 2015-03-03 – 2015-03-04 (×2): 4 mg via ORAL
  Filled 2015-03-02 (×2): qty 1

## 2015-03-02 MED ORDER — SERTRALINE HCL 100 MG PO TABS
200.0000 mg | ORAL_TABLET | Freq: Every day | ORAL | Status: DC
  Administered 2015-03-02 – 2015-03-03 (×2): 200 mg via ORAL
  Filled 2015-03-02 (×3): qty 2

## 2015-03-02 MED ORDER — CLOPIDOGREL BISULFATE 75 MG PO TABS
75.0000 mg | ORAL_TABLET | Freq: Every day | ORAL | Status: DC
  Administered 2015-03-03 – 2015-03-04 (×2): 75 mg via ORAL
  Filled 2015-03-02 (×2): qty 1

## 2015-03-02 MED ORDER — POTASSIUM CHLORIDE 10 MEQ/100ML IV SOLN
10.0000 meq | INTRAVENOUS | Status: AC
  Administered 2015-03-02 (×2): 10 meq via INTRAVENOUS
  Filled 2015-03-02 (×2): qty 100

## 2015-03-02 MED ORDER — ASPIRIN EC 81 MG PO TBEC
81.0000 mg | DELAYED_RELEASE_TABLET | Freq: Every day | ORAL | Status: DC
  Administered 2015-03-03 – 2015-03-04 (×2): 81 mg via ORAL
  Filled 2015-03-02 (×2): qty 1

## 2015-03-02 MED ORDER — HYDRALAZINE HCL 20 MG/ML IJ SOLN: 5.0000 mg | Freq: Four times a day (QID) | INTRAMUSCULAR | Status: DC | PRN

## 2015-03-02 MED ORDER — ARIPIPRAZOLE 5 MG PO TABS
5.0000 mg | ORAL_TABLET | Freq: Every day | ORAL | Status: DC
  Administered 2015-03-03 – 2015-03-04 (×2): 5 mg via ORAL
  Filled 2015-03-02 (×2): qty 1

## 2015-03-02 MED ORDER — PANTOPRAZOLE SODIUM 40 MG PO TBEC
40.0000 mg | DELAYED_RELEASE_TABLET | Freq: Every day | ORAL | Status: DC
Start: 2015-03-03 — End: 2015-03-04
  Administered 2015-03-03 – 2015-03-04 (×2): 40 mg via ORAL
  Filled 2015-03-02 (×2): qty 1

## 2015-03-02 MED ORDER — ENOXAPARIN SODIUM 30 MG/0.3ML ~~LOC~~ SOLN
30.0000 mg | Freq: Every day | SUBCUTANEOUS | Status: DC
  Administered 2015-03-02 – 2015-03-03 (×2): 30 mg via SUBCUTANEOUS
  Filled 2015-03-02 (×3): qty 0.3

## 2015-03-02 MED ORDER — ISOSORBIDE DINITRATE 20 MG PO TABS
20.0000 mg | ORAL_TABLET | Freq: Three times a day (TID) | ORAL | Status: DC
  Administered 2015-03-03 – 2015-03-04 (×4): 20 mg via ORAL
  Filled 2015-03-02 (×6): qty 1

## 2015-03-02 MED ORDER — LEVOTHYROXINE SODIUM 100 MCG PO TABS
100.0000 ug | ORAL_TABLET | Freq: Every day | ORAL | Status: DC
  Administered 2015-03-02 – 2015-03-04 (×3): 100 ug via ORAL
  Filled 2015-03-02 (×4): qty 1

## 2015-03-02 MED ORDER — INSULIN ASPART 100 UNIT/ML ~~LOC~~ SOLN
0.0000 [IU] | Freq: Three times a day (TID) | SUBCUTANEOUS | Status: DC
  Administered 2015-03-03: 2 [IU] via SUBCUTANEOUS
  Administered 2015-03-03: 3 [IU] via SUBCUTANEOUS
  Administered 2015-03-03: 2 [IU] via SUBCUTANEOUS
  Administered 2015-03-04: 5 [IU] via SUBCUTANEOUS

## 2015-03-02 NOTE — Care Management Note (Addendum)
Case Management Note  Patient Details  Name: Taylor Padilla MRN: [id number] Date of Birth: [birth year]  Subjective/Objective:      69 [facility] Pt informed CM she was being "admitted" Imaging-=Right acetabular anterior column fracture, with involvement of the right inferior pubic ramus. The fracture is relatively nondisplaced. Pt without family at her bedside   Action/Plan:  CM noted pt without pcp Reviewed pt sheets at bedside stating pt with Taylor Padilla as pcp Pt informed CM she has never seen him Pt is blind CM discussed with her that this is the provider listed to care for her at the facility   CM spoke with EDP, Taylor Padilla about pt Offered home health services for pt at the facility His preference is admission Refer to EDP and admitting MD notes Pt updated   Expected Discharge Date:   03/02/15                Expected Discharge Plan:        Post Acute Care Choice:    Choice offered to:     DME Arranged:    DME Agency:     HH Arranged:    HH Agency:     Status of Service:       Additional Comments: 1543 03/02/15 spoke with Dr Blake DivineAkula after speaking with ED SW to inquire if pt facility allows a higher level of care 1550 CM spoke with Dr Blake DivineAkula and ED SW --Pt facility does not have a high level of care SW referral entered to assist with getting pt high level of care per Blake DivineAkula plus pending results of further labs    Ophelia ShoulderGibbs, Kimberly Louise, RN 03/02/2015, 3:22 PM

## 2015-03-02 NOTE — Clinical Social Work Note (Signed)
Clinical Social Work Assessment  Patient Details  Name: Taylor Padilla MRN: [id number] Date of Birth: Apr 08, 1946  Date of referral:  03/02/15               Reason for consult:  Facility Placement (Patient is from [place], but currently needs a higher level of care.)                Permission sought to share information with:   (None.) Permission granted to share information::  No  Name::        Agency::     Relationship::     Contact Information:     Housing/Transportation Living arrangements for the past 2 months:  [place] of Information:  Patient Patient Interpreter Needed:  None Criminal Activity/Legal Involvement Pertinent to Current Situation/Hospitalization:  No - Comment as needed Significant Relationships:  Siblings (Patient informed CSW that she has a good relationship with her siblings, but they seldom visit. She states that her brother lives a few blocks away from the facility and her sister lives in [place].) Lives with:  Facility Resident Do you feel safe going back to the place where you live?  Yes Need for family participation in patient care:   (Patient stated that her family seldomly visits.)  Care giving concerns:  The patient is currently staying in the Assisted Living Unit at [facility]. However, she may require a higher level of care at another facility who has a skilled nursing unit.   Social Worker assessment / plan:  CSW met with patient at bedside. There was no family present. Patient confirms that she presents to [facility] due to R leg pain. Also, she confirms that she is from [place]. She states that she has been living at the facility for a little over a year and stays in the Assisted Living Unit.  Patient informed CSW that her R leg pain is due to her falling 5 days ago. Patient states that she does not fall often. She says this is the first incident. When asked about completing her ADL's patient stated " If I can do it myself... I do  it myself."  Patient informed CSW that she is blind.  Patient states that she does not have any questions at this time.  Employment status:  Retired Forensic scientist:   [employer].) PT Recommendations:    Information / Referral to community resources:   (CSW will provide patient with a list of SNF.)  Patient/Family's Response to care:  Patient's response is appropriate at this time. She is aware that she is being admitted and is accepting.  Patient/Family's Understanding of and Emotional Response to Diagnosis, Current Treatment, and Prognosis:  Patient is aware that she is being admitted and stated " I have a bone broken in my pelvis."  Emotional Assessment Appearance:  Appears stated age Attitude/Demeanor/Rapport:   (Well Mannered.) Affect (typically observed):  Accepting, Appropriate Orientation:  Oriented to Self, Oriented to Place, Oriented to  Time, Oriented to Situation Alcohol / Substance use:  Not Applicable Psych involvement (Current and /or in the community):  No (Comment)  Discharge Needs  Concerns to be addressed:  Adjustment to Illness Readmission within the last 30 days:  No Current discharge risk:  None Barriers to Discharge:  No Barriers Identified   Bernita Buffy, LCSW 03/02/2015, 9:39 PM

## 2015-03-02 NOTE — ED Notes (Signed)
Pt c/o right leg pain from groin to knee since falling 1wk ago.  [facility] is [facility] and had mobile XR done on site, which was negative.  Pt states "I don't trust those x-rays".  Pt has no other complaints at this time.

## 2015-03-02 NOTE — ED Provider Notes (Signed)
CSN: [id number]     Arrival date & time 03/02/15  1125 History   First MD Initiated Contact with Patient 03/02/15 1142     Chief Complaint  Patient presents with  . Leg Pain    Leg pain since fall 1wk ago      HPI  Vision presents for evaluation of right leg pain. Had a fall a week ago. Lives at an assisted living facility [facility]. Had a mobile x-ray done was told it was negative for stroke cannot move the leg without a great of pain. Cannot stand her transfer.  Past Medical History  Diagnosis Date  . Hypertension   . CAD (coronary artery disease)   . 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 Macular edema     hx  . Glaucoma, both eyes   . High cholesterol   . CHF (congestive heart failure)   . Chronic kidney disease (CKD), stage II (mild)     [name]/notes 10/07/2014  . Old myocardial infarct     "I was told I've had one; don't know when" (10/08/2014)  . Pneumonia 1990's X 1  . Hypothyroidism   . Type II diabetes mellitus   . Anemia   . History of blood transfusion 2003    "related to OHS"  . Stroke     "I've been told I've had one; I don't remember having any reaction from it at all"; denies residual on 10/08/2014   Past Surgical History  Procedure Laterality Date  . Coronary artery bypass graft  01/2002    "CABG X4" at [place]  . Cataract extraction w/ intraocular lens  implant, bilateral Bilateral    Family History  Problem Relation Age of Onset  . CAD Neg Hx    History  Substance Use Topics  . Smoking status: Never Smoker   . Smokeless tobacco: Never Used  . Alcohol Use: No   OB History    No data available     Review of Systems  Constitutional: Negative for fever, chills, diaphoresis, appetite change and fatigue.  HENT: Negative for mouth sores, sore throat and trouble swallowing.   Eyes: Negative for visual disturbance.  Respiratory: Negative for cough, chest tightness, shortness of breath and wheezing.   Cardiovascular: Negative for chest pain.    Gastrointestinal: Negative for nausea, vomiting, abdominal pain, diarrhea and abdominal distention.  Endocrine: Negative for polydipsia, polyphagia and polyuria.  Genitourinary: Negative for dysuria, frequency and hematuria.  Musculoskeletal: Positive for arthralgias. Negative for gait problem.  Skin: Negative for color change, pallor and rash.  Neurological: Negative for dizziness, syncope, light-headedness and headaches.  Hematological: Does not bruise/bleed easily.  Psychiatric/Behavioral: Negative for behavioral problems and confusion.      Allergies  Epinephrine  Home Medications   Prior to Admission medications   Medication Sig Start Date End Date Taking? Authorizing Provider  amLODipine (NORVASC) 10 MG tablet Take 10 mg by mouth daily. 09/27/14  Yes Historical Provider, MD  ARIPiprazole (ABILIFY) 5 MG tablet Take 5 mg by mouth daily.   Yes Historical Provider, MD  ARTIFICIAL TEAR SOLUTION OP Place 1 drop into the left eye 4 (four) times daily.   Yes Historical Provider, MD  aspirin EC 81 MG EC tablet Take 1 tablet (81 mg total) by mouth daily. 11/26/14  Yes Renae FickleMackenzie Short, MD  atorvastatin (LIPITOR) 80 MG tablet Take 80 mg by mouth at bedtime. 09/21/14  Yes Historical Provider, MD  carvedilol (COREG) 12.5 MG tablet Take 12.5 mg by  mouth 2 (two) times daily with a meal.   Yes Historical Provider, MD  clopidogrel (PLAVIX) 75 MG tablet Take 75 mg by mouth daily. 09/24/14  Yes Historical Provider, MD  dorzolamide-timolol (COSOPT) 22.3-6.8 MG/ML ophthalmic solution Place 1 drop into both eyes every 12 (twelve) hours. 10/06/14  Yes Historical Provider, MD  doxazosin (CARDURA) 4 MG tablet Take 1 tablet (4 mg total) by mouth daily. 11/26/14  Yes Renae Fickle, MD  ferrous sulfate 325 (65 FE) MG tablet Take 1 tablet (325 mg total) by mouth 3 (three) times daily with meals. 11/26/14  Yes Renae Fickle, MD  furosemide (LASIX) 40 MG tablet Take 40-80 mg by mouth 2 (two) times daily. 2 tab in  the morning and 1 in the evening   Yes Historical Provider, MD  hydrALAZINE (APRESOLINE) 100 MG tablet Take 1 tablet (100 mg total) by mouth every 8 (eight) hours. 11/03/14  Yes Joseph Art, DO  isosorbide dinitrate (ISORDIL) 10 MG tablet Take 2 tablets (20 mg total) by mouth 3 (three) times daily. 10/10/14  Yes Jerald Kief, MD  lansoprazole (PREVACID) 30 MG capsule Take 30 mg by mouth daily. 09/27/14  Yes Historical Provider, MD  levothyroxine (SYNTHROID, LEVOTHROID) 100 MCG tablet Take 1 tablet (100 mcg total) by mouth daily before breakfast. 11/26/14  Yes Renae Fickle, MD  Melatonin 3 MG TABS Take 3 mg by mouth at bedtime.   Yes Historical Provider, MD  prednisoLONE acetate (PRED FORTE) 1 % ophthalmic suspension Place 1 drop into the left eye 4 (four) times daily. 09/27/14  Yes Historical Provider, MD  ranitidine (ZANTAC) 150 MG tablet Take 150 mg by mouth 2 (two) times daily as needed for heartburn. indigestion 09/24/14  Yes Historical Provider, MD  sertraline (ZOLOFT) 100 MG tablet Take 200 mg by mouth at bedtime.   Yes Historical Provider, MD  traMADol (ULTRAM) 50 MG tablet Take 1 tablet (50 mg total) by mouth every 6 (six) hours as needed for moderate pain. 11/03/14  Yes Joseph Art, DO  furosemide (LASIX) 80 MG tablet Take 1 tab in the morning and half a tab in the evening Patient not taking: Reported on 03/02/2015 11/26/14   Renae Fickle, MD   BP 175/60 mmHg  Pulse 54  Temp(Src) 98.2 F (36.8 C) (Oral)  Resp 14  SpO2 97% Physical Exam  Constitutional: She is oriented to person, place, and time. She appears well-developed and well-nourished. No distress.  HENT:  Head: Normocephalic.  Eyes: Conjunctivae are normal. Pupils are equal, round, and reactive to light. No scleral icterus.  Neck: Normal range of motion. Neck supple. No thyromegaly present.  Cardiovascular: Normal rate and regular rhythm.  Exam reveals no gallop and no friction rub.   No murmur heard. Pulmonary/Chest:  Effort normal and breath sounds normal. No respiratory distress. She has no wheezes. She has no rales.  Abdominal: Soft. Bowel sounds are normal. She exhibits no distension. There is no tenderness. There is no rebound.  Musculoskeletal: Normal range of motion.       Legs: Neurological: She is alert and oriented to person, place, and time.  Skin: Skin is warm and dry. No rash noted.  Psychiatric: She has a normal mood and affect. Her behavior is normal.    ED Course  Procedures (including critical care time) Labs Review Labs Reviewed  CBG MONITORING, ED - Abnormal; Notable for the following:    Glucose-Capillary 272 (*)    All other components within normal limits  CBC WITH  DIFFERENTIAL/PLATELET  BASIC METABOLIC PANEL  URINALYSIS, ROUTINE W REFLEX MICROSCOPIC (NOT AT [facility])    Imaging Review Ct Pelvis Wo Contrast  03/02/2015   CLINICAL DATA:  Right leg pain extending from the groin to the knee. Fall 1 week ago.  EXAM: CT PELVIS WITHOUT CONTRAST  TECHNIQUE: Multidetector CT imaging of the pelvis was performed following the standard protocol without intravenous contrast.  COMPARISON:  None.  FINDINGS: Visit acute nondisplaced fracture the right inferior pubic ramus. Suspected linear nondisplaced fracture of the right acetabulum involving the quadrilateral plate and extending cephalad, compatible with an anterior column fracture.  Degenerative SI joint arthropathy noted, right greater than left.  Grade 1 degenerative anterolisthesis at L5-S1.  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  Retroverted uterus.  Scattered sigmoid colon diverticula.  IMPRESSION: 1. Right acetabular anterior column fracture, with involvement of the right inferior pubic ramus. The fracture is relatively nondisplaced. 2. Degenerative SI joint arthropathy noted, right greater than left. 3. Grade 1 degenerative anterolisthesis at L5-S1.   Electronically Signed   By: Gaylyn Rong M.D.   On: 03/02/2015 14:27    Dg Knee Complete 4 Views Right  03/02/2015   CLINICAL DATA:  Pain following fall 1 week prior  EXAM: RIGHT KNEE - COMPLETE 4+ VIEW  COMPARISON:  None.  FINDINGS: Frontal, lateral, and bilateral oblique views were obtained. There is no demonstrable fracture or dislocation. No joint effusion. There is moderate narrowing medially. There is mild spurring along the posterior aspect of the patellofemoral joint as well as spurring medially and laterally. There are scattered foci of arterial vascular calcification. No erosive change.  IMPRESSION: Osteoarthritic change, most notably along the medial aspect. No fracture or effusion. Areas of arterial vascular calcification present.   Electronically Signed   By: Bretta Bang III M.D.   On: 03/02/2015 14:28     EKG Interpretation None      MDM   Final diagnoses:  Fall    CT scan shows anterior column acetabular fracture. I discussed the finding with Dr. Wynn Banker. He recommended nonweightbearing and crutches or walker. Patient is unable to tolerate symptoms or even transfer. I discussed the case with hospitalist. She'll be admitted. Physical therapy evaluation. We have recommendations. She resides at a assisted living facility currently.    Rolland Porter, MD 03/02/15 [phone]

## 2015-03-02 NOTE — Progress Notes (Signed)
CSW reached out to Taylor Padilla to inquire if the facility has a skilled nursing unit.  CSW spoke with Talbert ForestShirley who states that the facility does not have a skilled nursing unit.  Trish MageBrittney Siddhartha Hoback, LCSWA [phone] ED CSW 03/02/2015 3:40 PM

## 2015-03-02 NOTE — Progress Notes (Signed)
CSW completed FL2 for patient.  Trish MageBrittney Earnest Thalman, LCSWA [phone] ED CSW 03/02/2015 11:34 PM

## 2015-03-02 NOTE — H&P (Signed)
[facility] History and Physical  Taylor Padilla ZOX:[id number] DOB: 1945/11/16 DOA: 03/02/2015  Referring physician: EDP PCP: Florentina Jenny, MD   Chief Complaint: camein for right hip pain.   HPI: Taylor Padilla is a 69 y.o. female with prior h/o hypertension, DM, CAD, CHF, CKD Stage 2, hypothyroidism, Blind in both eyes reported had a mechanical fall few days ago, but had persistent right hip pain, initial X RAYS shows negative for fracture, but as she had persistent pain, she was brought to ED, . CT of the hip and pelvis showed right acetabular anterior column fracture with involvement of the right inferior pubic ramus. He was referred to medical service for pain control.  Her labs revealed low potassium and baseline creatinine of 2. Her UA was significant for infection.    Review of Systems:  Constitutional:  Reports fatigue.  HEENT:  No headaches, Difficulty swallowing,Tooth/dental problems,Sore throat,  No sneezing, itching, ear ache, nasal congestion, post nasal drip,  Cardio-vascular:  No chest pain, Orthopnea, PND, swelling in lower extremities, anasarca, dizziness, palpitations  GI:  No heartburn, indigestion, abdominal pain, nausea, vomiting, diarrhea, change in bowel habits, loss of appetite  Resp:  No shortness of breath with exertion or at rest. No excess mucus, no productive cough, No non-productive cough, No coughing up of blood.No change in color of mucus.No wheezing.No chest wall deformity  Skin:  no rash or lesions.  GU:  no dysuria, change in color of urine, no urgency or frequency. No flank pain.  Musculoskeletal:  Right hip pain. Psych:  No change in mood or affect. No depression or anxiety. No memory loss.   Past Medical History  Diagnosis Date  . Hypertension   . CAD (coronary artery disease)   . 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 Macular edema     hx  . Glaucoma, both eyes   . High cholesterol   . CHF (congestive heart failure)   . Chronic  kidney disease (CKD), stage II (mild)     [name] 10/07/2014  . Old myocardial infarct     "I was told I've had one; don't know when" (10/08/2014)  . Pneumonia 1990's X 1  . Hypothyroidism   . Type II diabetes mellitus   . Anemia   . History of blood transfusion 2003    "related to OHS"  . Stroke     "I've been told I've had one; I don't remember having any reaction from it at all"; denies residual on 10/08/2014   Past Surgical History  Procedure Laterality Date  . Coronary artery bypass graft  01/2002    "CABG X4" at [place]  . Cataract extraction w/ intraocular lens  implant, bilateral Bilateral    Social History:  reports that she has never smoked. She has never used smokeless tobacco. She reports that she does not drink alcohol or use illicit drugs.  Allergies  Allergen Reactions  . Epinephrine Other (See Comments)    Patient says she is not allergic to epi, but that it makes her jittery.  WILL RECEIVE IF NEEDED IN AN EMERGENCY.    Family History  Problem Relation Age of Onset  . CAD Neg Hx     do not leave blank  Prior to Admission medications   Medication Sig Start Date End Date Taking? Authorizing Provider  amLODipine (NORVASC) 10 MG tablet Take 10 mg by mouth daily. 09/27/14  Yes Historical Provider, MD  ARIPiprazole (ABILIFY) 5 MG tablet Take 5 mg by mouth daily.  Yes Historical Provider, MD  ARTIFICIAL TEAR SOLUTION OP Place 1 drop into the left eye 4 (four) times daily.   Yes Historical Provider, MD  aspirin EC 81 MG EC tablet Take 1 tablet (81 mg total) by mouth daily. 11/26/14  Yes Renae FickleMackenzie Short, MD  atorvastatin (LIPITOR) 80 MG tablet Take 80 mg by mouth at bedtime. 09/21/14  Yes Historical Provider, MD  carvedilol (COREG) 12.5 MG tablet Take 12.5 mg by mouth 2 (two) times daily with a meal.   Yes Historical Provider, MD  clopidogrel (PLAVIX) 75 MG tablet Take 75 mg by mouth daily. 09/24/14  Yes Historical Provider, MD  dorzolamide-timolol (COSOPT) 22.3-6.8 MG/ML  ophthalmic solution Place 1 drop into both eyes every 12 (twelve) hours. 10/06/14  Yes Historical Provider, MD  doxazosin (CARDURA) 4 MG tablet Take 1 tablet (4 mg total) by mouth daily. 11/26/14  Yes Renae FickleMackenzie Short, MD  ferrous sulfate 325 (65 FE) MG tablet Take 1 tablet (325 mg total) by mouth 3 (three) times daily with meals. 11/26/14  Yes Renae FickleMackenzie Short, MD  furosemide (LASIX) 40 MG tablet Take 40-80 mg by mouth 2 (two) times daily. 2 tab in the morning and 1 in the evening   Yes Historical Provider, MD  hydrALAZINE (APRESOLINE) 100 MG tablet Take 1 tablet (100 mg total) by mouth every 8 (eight) hours. 11/03/14  Yes Joseph ArtJessica U Vann, DO  isosorbide dinitrate (ISORDIL) 10 MG tablet Take 2 tablets (20 mg total) by mouth 3 (three) times daily. 10/10/14  Yes Jerald KiefStephen K Chiu, MD  lansoprazole (PREVACID) 30 MG capsule Take 30 mg by mouth daily. 09/27/14  Yes Historical Provider, MD  levothyroxine (SYNTHROID, LEVOTHROID) 100 MCG tablet Take 1 tablet (100 mcg total) by mouth daily before breakfast. 11/26/14  Yes Renae FickleMackenzie Short, MD  Melatonin 3 MG TABS Take 3 mg by mouth at bedtime.   Yes Historical Provider, MD  prednisoLONE acetate (PRED FORTE) 1 % ophthalmic suspension Place 1 drop into the left eye 4 (four) times daily. 09/27/14  Yes Historical Provider, MD  ranitidine (ZANTAC) 150 MG tablet Take 150 mg by mouth 2 (two) times daily as needed for heartburn. indigestion 09/24/14  Yes Historical Provider, MD  sertraline (ZOLOFT) 100 MG tablet Take 200 mg by mouth at bedtime.   Yes Historical Provider, MD  traMADol (ULTRAM) 50 MG tablet Take 1 tablet (50 mg total) by mouth every 6 (six) hours as needed for moderate pain. 11/03/14  Yes Joseph ArtJessica U Vann, DO   Physical Exam: Filed Vitals:   03/02/15 1434 03/02/15 1708 03/02/15 1839 03/02/15 1934  BP: 175/60 141/68 171/50 195/56  Pulse: 54 50 50 51  Temp:    98.4 F (36.9 C)  TempSrc:    Oral  Resp: 14 14 16 20   SpO2: 97% 95% 93% 95%    Wt Readings from Last 3  Encounters:  12/31/14 66.225 kg (146 lb)  12/07/14 67.813 kg (149 lb 8 oz)  11/26/14 65.8 kg (145 lb 1 oz)    General:  Appears calm and comfortable Eyes: PERRL, normal lids, irises & conjunctiva Neck: no LAD, masses or thyromegaly Cardiovascular: RRR, no m/r/g. No LE edema Respiratory: CTA bilaterally, no w/r/r. Normal respiratory effort. Abdomen: soft, ntnd Skin: no rash or induration seen on limited exam Musculoskeletal: grossly normal tone BUE/BLE Neurologic: grossly non-focal.          Labs on Admission:  Basic Metabolic Panel:  Recent Labs Lab 03/02/15 1536  NA 133*  K 2.8*  CL 93*  CO2 26  GLUCOSE 247*  BUN 38*  CREATININE 2.10*  CALCIUM 8.9  MG 2.0   Liver Function Tests: No results for input(s): AST, ALT, ALKPHOS, BILITOT, PROT, ALBUMIN in the last 168 hours. No results for input(s): LIPASE, AMYLASE in the last 168 hours. No results for input(s): AMMONIA in the last 168 hours. CBC:  Recent Labs Lab 03/02/15 1536  WBC 8.3  NEUTROABS 7.0  HGB 11.2*  HCT 34.6*  MCV 90.8  PLT 302   Cardiac Enzymes: No results for input(s): CKTOTAL, CKMB, CKMBINDEX, TROPONINI in the last 168 hours.  BNP (last 3 results)  Recent Labs  10/31/14 1645 11/19/14 1215 12/07/14 1530  BNP 1931.3* 2140.8* 162.9*    ProBNP (last 3 results) No results for input(s): PROBNP in the last 8760 hours.  CBG:  Recent Labs Lab 03/02/15 1243  GLUCAP 272*    Radiological Exams on Admission: Ct Pelvis Wo Contrast  03/02/2015   CLINICAL DATA:  Right leg pain extending from the groin to the knee. Fall 1 week ago.  EXAM: CT PELVIS WITHOUT CONTRAST  TECHNIQUE: Multidetector CT imaging of the pelvis was performed following the standard protocol without intravenous contrast.  COMPARISON:  None.  FINDINGS: Visit acute nondisplaced fracture the right inferior pubic ramus. Suspected linear nondisplaced fracture of the right acetabulum involving the quadrilateral plate and extending  cephalad, compatible with an anterior column fracture.  Degenerative SI joint arthropathy noted, right greater than left.  Grade 1 degenerative anterolisthesis at L5-S1.  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  Retroverted uterus.  Scattered sigmoid colon diverticula.  IMPRESSION: 1. Right acetabular anterior column fracture, with involvement of the right inferior pubic ramus. The fracture is relatively nondisplaced. 2. Degenerative SI joint arthropathy noted, right greater than left. 3. Grade 1 degenerative anterolisthesis at L5-S1.   Electronically Signed   By: Gaylyn Rong M.D.   On: 03/02/2015 14:27   Dg Knee Complete 4 Views Right  03/02/2015   CLINICAL DATA:  Pain following fall 1 week prior  EXAM: RIGHT KNEE - COMPLETE 4+ VIEW  COMPARISON:  None.  FINDINGS: Frontal, lateral, and bilateral oblique views were obtained. There is no demonstrable fracture or dislocation. No joint effusion. There is moderate narrowing medially. There is mild spurring along the posterior aspect of the patellofemoral joint as well as spurring medially and laterally. There are scattered foci of arterial vascular calcification. No erosive change.  IMPRESSION: Osteoarthritic change, most notably along the medial aspect. No fracture or effusion. Areas of arterial vascular calcification present.   Electronically Signed   By: Bretta Bang III M.D.   On: 03/02/2015 14:28    EKG: reviewed, showed sinus rhythm and prolonged QT interval.   Assessment/Plan Active Problems:   Hip fracture   Right acetabular fracture: - from mechanical fall. EDP consulted orthopedics and recommended non surgical treatment with pain control and physical therapy.  SW consulted and looking for SNF.  - pain control and PT evaluation.   Hypokalemia: Replete as needed.  Mag level normal.  Repeat in am.    Stage 2 CKD: Stable.   Hypertension: Well controlled.    CAD: - no chest pain, resume aspirin and plavix.    CHF: Chronic and she appears to be slightly dehydrated.  Holding lasix tonight.    UTI: Not much symptomatic except for generalized weakness, getting urine cultures and started on rocephin.   Diabetes Mellitus: CBG (last 3)   Recent Labs  03/02/15 1243  GLUCAP 272*  SSI, hgba1c ordered.   Hyponatremia: probably from dehydration. hlding lasix.  Repeat labs in am.     Code Status: presumed full code.  DVT Prophylaxis: Family Communication: none at bedside.  Disposition Plan: pending PT EVAL.   Time spent:  [facility] Pager [phone]

## 2015-03-02 NOTE — ED Notes (Signed)
Bed: WA02 Expected date:  Expected time:  Means of arrival:  Comments: EMS-right leg pain

## 2015-03-03 DIAGNOSIS — E1165 Type 2 diabetes mellitus with hyperglycemia: Secondary | ICD-10-CM | POA: Diagnosis not present

## 2015-03-03 DIAGNOSIS — E038 Other specified hypothyroidism: Secondary | ICD-10-CM

## 2015-03-03 DIAGNOSIS — E034 Atrophy of thyroid (acquired): Secondary | ICD-10-CM | POA: Diagnosis not present

## 2015-03-03 DIAGNOSIS — N184 Chronic kidney disease, stage 4 (severe): Secondary | ICD-10-CM | POA: Diagnosis not present

## 2015-03-03 DIAGNOSIS — N182 Chronic kidney disease, stage 2 (mild): Secondary | ICD-10-CM

## 2015-03-03 DIAGNOSIS — S32401A Unspecified fracture of right acetabulum, initial encounter for closed fracture: Secondary | ICD-10-CM | POA: Diagnosis not present

## 2015-03-03 DIAGNOSIS — N3000 Acute cystitis without hematuria: Secondary | ICD-10-CM | POA: Diagnosis not present

## 2015-03-03 LAB — CBC
HCT: 31.5 % — ABNORMAL LOW (ref 36.0–46.0)
HEMOGLOBIN: 10.3 g/dL — AB (ref 12.0–15.0)
MCH: 29.7 pg (ref 26.0–34.0)
MCHC: 32.7 g/dL (ref 30.0–36.0)
MCV: 90.8 fL (ref 78.0–100.0)
Platelets: 283 10*3/uL (ref 150–400)
RBC: 3.47 MIL/uL — AB (ref 3.87–5.11)
RDW: 14.4 % (ref 11.5–15.5)
WBC: 9.2 10*3/uL (ref 4.0–10.5)

## 2015-03-03 LAB — GLUCOSE, CAPILLARY
GLUCOSE-CAPILLARY: 182 mg/dL — AB (ref 65–99)
GLUCOSE-CAPILLARY: 228 mg/dL — AB (ref 65–99)
Glucose-Capillary: 181 mg/dL — ABNORMAL HIGH (ref 65–99)
Glucose-Capillary: 138 mg/dL — ABNORMAL HIGH (ref 65–99)

## 2015-03-03 LAB — OCCULT BLOOD X 1 CARD TO LAB, STOOL: Fecal Occult Bld: POSITIVE — AB

## 2015-03-03 LAB — BASIC METABOLIC PANEL
Anion gap: 9 (ref 5–15)
BUN: 35 mg/dL — AB (ref 6–20)
CHLORIDE: 100 mmol/L — AB (ref 101–111)
CO2: 28 mmol/L (ref 22–32)
CREATININE: 2.01 mg/dL — AB (ref 0.44–1.00)
Calcium: 8.5 mg/dL — ABNORMAL LOW (ref 8.9–10.3)
GFR calc non Af Amer: 24 mL/min — ABNORMAL LOW (ref >60)
GFR, EST AFRICAN AMERICAN: 28 mL/min — AB (ref >60)
GLUCOSE: 200 mg/dL — AB (ref 65–99)
Potassium: 2.9 mmol/L — ABNORMAL LOW (ref 3.5–5.1)
Sodium: 137 mmol/L (ref 135–145)

## 2015-03-03 LAB — MRSA PCR SCREENING: MRSA by PCR: NEGATIVE

## 2015-03-03 MED ORDER — DORZOLAMIDE HCL-TIMOLOL MAL 2-0.5 % OP SOLN
1.0000 [drp] | Freq: Two times a day (BID) | OPHTHALMIC | Status: DC
Start: 2015-03-03 — End: 2015-03-04
  Administered 2015-03-03 – 2015-03-04 (×3): 1 [drp] via OPHTHALMIC
  Filled 2015-03-03: qty 10

## 2015-03-03 MED ORDER — POTASSIUM CHLORIDE CRYS ER 20 MEQ PO TBCR
40.0000 meq | EXTENDED_RELEASE_TABLET | Freq: Once | ORAL | Status: AC
  Administered 2015-03-03: 40 meq via ORAL
  Filled 2015-03-03: qty 2

## 2015-03-03 MED ORDER — OXYCODONE-ACETAMINOPHEN 5-325 MG PO TABS
1.0000 | ORAL_TABLET | ORAL | Status: DC | PRN
  Administered 2015-03-04: 1 via ORAL
  Filled 2015-03-03: qty 1

## 2015-03-03 MED ORDER — PREDNISOLONE ACETATE 1 % OP SUSP
1.0000 [drp] | Freq: Four times a day (QID) | OPHTHALMIC | Status: DC
  Administered 2015-03-03 – 2015-03-04 (×3): 1 [drp] via OPHTHALMIC
  Filled 2015-03-03: qty 1

## 2015-03-03 NOTE — Evaluation (Signed)
Physical Therapy Evaluation Patient Details Name: Taylor Padilla MRN: [id number] DOB: 10/29/1945 Today's Date: 03/03/2015   History of Present Illness  Taylor Padilla is a 69 y.o. female with prior h/o hypertension, DM, CAD, CHF, CKD Stage 2, hypothyroidism, Blind in both eyes reported had a mechanical fall few days PTA. initial X RAYS shows negative for fracture, but as she had persistent pain, she was brought to ED 03/02/15. , . CT of the hip and pelvis showed right acetabular anterior column fracture with involvement of the right inferior pubic ramus. He was referred to medical service for pain control.   Clinical Impression  Patient  Was able to mobilize to sitting at the edge of  The bed with 2 person assist. Patient is managing her R leg and does not  Want any assist moving it due to pain. Patient  Will benefit from PT to address problems listed in note below.    Follow Up Recommendations SNF;Supervision/Assistance - 24 hour    Equipment Recommendations   (wheelchair)    Recommendations for Other Services       Precautions / Restrictions Precautions Precautions: Fall Precaution Comments: legally blind, PATIENT DOES NOT WANT ANYONE TO MOVE HER right LEG. SHE WANTS TO DO IT  HERSSELF. Restrictions Weight Bearing Restrictions: Yes RLE Weight Bearing: Non weight bearing      Mobility  Bed Mobility Overal bed mobility: Needs Assistance;+ 2 for safety/equipment Bed Mobility: Rolling;Sidelying to Sit;Sit to Sidelying Rolling: Mod assist;+2 for safety/equipment Sidelying to sit: Mod assist;+2 for safety/equipment     Sit to sidelying: Mod assist;+2 for safety/equipment General bed mobility comments: extra time required for patient to roll to each side, flexes hips and rolls legs together. able to move both legs over the L edge, assist at trunk to upright. Patient able to go back to side and place legs onto bed. Patient requests that PT not touch her R leg.  Transfers                  General transfer comment: not tested   Ambulation/Gait                Stairs            Wheelchair Mobility    Modified Rankin (Stroke Patients Only)       Balance Overall balance assessment: Needs assistance;History of Falls Sitting-balance support: Feet unsupported;Bilateral upper extremity supported Sitting balance-Leahy Scale: Fair Sitting balance - Comments: able to sit at midline, able to lean to the L with control                                     Pertinent Vitals/Pain Pain Assessment: 0-10 Pain Score: 9  Pain Descriptors / Indicators: Contraction;Cramping;Grimacing;Crying;Spasm;Heaviness Pain Intervention(s): Limited activity within patient's tolerance;Monitored during session;Premedicated before session;Repositioned    Home Living Family/patient expects to be discharged to:: Assisted living               Home Equipment: Walker - 2 wheels;Walker - 4 wheels Additional Comments: They do her meds and she goes to the dining room for meals, otherwise she does all her basic self care    Prior Function Level of Independence: Independent with assistive device(s)               Hand Dominance        Extremity/Trunk Assessment   Upper Extremity Assessment: Overall WFL for tasks  assessed           Lower Extremity Assessment: RLE deficits/detail RLE Deficits / Details: patient is able  slowly flex hip in supine to 45. uses her hands to self assist. also able to move the leg over edge of bed and back onto bed.       Communication   Communication: No difficulties  Cognition Arousal/Alertness: Awake/alert Behavior During Therapy: WFL for tasks assessed/performed Overall Cognitive Status: Within Functional Limits for tasks assessed                      General Comments      Exercises        Assessment/Plan    PT Assessment Patient needs continued PT services  PT Diagnosis Difficulty  walking;Acute pain   PT Problem List Decreased strength;Decreased range of motion;Decreased activity tolerance;Decreased mobility;Decreased knowledge of precautions;Decreased safety awareness;Decreased knowledge of use of DME;Pain  PT Treatment Interventions Functional mobility training;Therapeutic activities;Therapeutic exercise;Patient/family education   PT Goals (Current goals can be found in the Care Plan section) Acute Rehab PT Goals Patient Stated Goal: i want to be able to get up PT Goal Formulation: With patient Time For Goal Achievement: 03/17/15 Potential to Achieve Goals: Good    Frequency Min 3X/week   Barriers to discharge        Co-evaluation               End of Session   Activity Tolerance: Patient tolerated treatment well;Patient limited by pain Patient left: in bed;with call bell/phone within reach Nurse Communication: Mobility status    Functional Assessment Tool Used: clinical judgement Functional Limitation: Mobility: Walking and moving around Mobility: Walking and Moving Around Current Status (N8295): 100 percent impaired, limited or restricted Mobility: Walking and Moving Around Goal Status (A2130): At least 20 percent but less than 40 percent impaired, limited or restricted    Time: 1347-1416 PT Time Calculation (min) (ACUTE ONLY): 29 min   Charges:   PT Evaluation $Initial PT Evaluation Tier I: 1 Procedure PT Treatments $Therapeutic Activity: 8-22 mins   PT G Codes:   PT G-Codes **NOT FOR INPATIENT CLASS** Functional Assessment Tool Used: clinical judgement Functional Limitation: Mobility: Walking and moving around Mobility: Walking and Moving Around Current Status (Q6578): 100 percent impaired, limited or restricted Mobility: Walking and Moving Around Goal Status (I6962): At least 20 percent but less than 40 percent impaired, limited or restricted    Rada Hay 03/03/2015, 2:32 PM Blanchard Kelch PT [phone]

## 2015-03-03 NOTE — Progress Notes (Signed)
[facility] PROGRESS NOTE  Taylor [name] [id number] DOB: November 03, 1945 DOA: 03/02/2015 PCP: Florentina JennyRIPP, HENRY, MD  Brief summary  The patient is a 69-year-old female with history of hypertension, coronary artery disease, congestive heart failure, diabetes mellitus type 2, CKD stage IV, hypothyroidism, blindness who presented with a mechanical fall which resulted in persistent right hip pain. Initial x-rays were negative for fracture however follow-up CT of the hip and pelvis demonstrated a right acetabular anterior column fracture with involvement of the right inferior pubic ramus. Her labs were notable for low potassium and a stable creatinine. Urinalysis was consistent with urinary tract infection.  She was started on antibiotics and admitted for pain control.  Assessment/Plan  Right acetabular fracture: - from mechanical fall. EDP consulted orthopedics and recommended non surgical treatment with pain control and physical therapy.  -  NWB RLE -  PT/OT evaluations ordered -  SW consulted and looking for SNF -  pain control and PT evaluation.   UTI: -  Add on urine culture -  Continue ceftriaxone  Hypokalemia, likely secondary to lasix use -  Continue oral repletion -  Mag level normal.   Stage 4 CKD, creatinine at baseline -  Minimize nephrotoxins and renally dose medications  Hypertension, blood pressure stable -  Continue doxazosin  CAD, chest pain free - Resume aspirin and plavix.  -  Not on BB secondary to hx of sinus bradycardia -  Resume high dose statin (atorva 80mg )  Chronic diastolic CHF, followed by heart failure clinic -  Appears euvolemic -  Resume lasix   Diabetes mellitus type 2 with renal manifestations -  A1c  - diet controlled -  Change to low dose SSI with meals  Hyponatremia, probably from dehydration, resolved.  Mild iron deficiency and hypothyroid-related anemia -  Continue iron supplementation -  GI f/u as outpatient to investigate source  of iron deficiency -  Occult stool  Hypothyroidism -  Repeat TSH -  Continue synthroid  Diet:  diabetic Access:  PIV IVF:  off Proph:  lovenox  Code Status: full Family Communication: patient alone Disposition Plan:  [facility] [place] and anticipate d/c to SNF, likely tomorrow or Friday.  Awaiting improvement in potassium, PT/OT evaluations.     Consultants:  Dr. Wynn BankerLachman, orthopedics  Procedures:  CT pelvis:  Right acetabular anterior column fracture with involvement of the right inferior pubic ramus, relatively nondisplaced, degenerative SI arthropathy, and grade 1 anterlisthesis L5-S1  Antibiotics:  Ceftriaxone 6/28 >  HPI/Subjective:  Still having pain in the right leg with movement but was able to work with PT.  Denies SOB or swelling of her legs.  Objective: Filed Vitals:   03/02/15 2030 03/03/15 0100 03/03/15 0300 03/03/15 0525  BP: 168/65 142/62  138/68  Pulse: 52 55  58  Temp: 98.2 F (36.8 C) 98.4 F (36.9 C)  98.6 F (37 C)  TempSrc: Oral Oral  Oral  Resp: 18 16  16   Height:   5' (1.524 m)   Weight:   68.543 kg (151 lb 1.8 oz)   SpO2: 97% 98%  97%    Intake/Output Summary (Last 24 hours) at 03/03/15 1422 Last data filed at 03/03/15 0900  Gross per 24 hour  Intake   1320 ml  Output      0 ml  Net   1320 ml   Filed Weights   03/03/15 0300  Weight: 68.543 kg (151 lb 1.8 oz)    Exam:   General:  Adult female,  blunted affect, No acute distress  HEENT:  NCAT, MMM  Cardiovascular:  RRR, nl S1, S2, 3/6 systolic murmur, 2+ pulses, warm extremities  Respiratory:  Rales at bilateral bases, no increased WOB  Abdomen:   NABS, soft, NT/ND  MSK:   Normal tone and bulk, no LEE  Neuro:  Grossly intact  Data Reviewed: Basic Metabolic Panel:  Recent Labs Lab 03/02/15 1536 03/03/15 0505  NA 133* 137  K 2.8* 2.9*  CL 93* 100*  CO2 26 28  GLUCOSE 247* 200*  BUN 38* 35*  CREATININE 2.10* 2.01*  CALCIUM 8.9 8.5*  MG 2.0  --     Liver Function Tests: No results for input(s): AST, ALT, ALKPHOS, BILITOT, PROT, ALBUMIN in the last 168 hours. No results for input(s): LIPASE, AMYLASE in the last 168 hours. No results for input(s): AMMONIA in the last 168 hours. CBC:  Recent Labs Lab 03/02/15 1536 03/03/15 0505  WBC 8.3 9.2  NEUTROABS 7.0  --   HGB 11.2* 10.3*  HCT 34.6* 31.5*  MCV 90.8 90.8  PLT 302 283   Cardiac Enzymes: No results for input(s): CKTOTAL, CKMB, CKMBINDEX, TROPONINI in the last 168 hours. BNP (last 3 results)  Recent Labs  10/31/14 1645 11/19/14 1215 12/07/14 1530  BNP 1931.3* 2140.8* 162.9*    ProBNP (last 3 results) No results for input(s): PROBNP in the last 8760 hours.  CBG:  Recent Labs Lab 03/02/15 1243 03/02/15 2201 03/03/15 0752 03/03/15 1222  GLUCAP 272* 207* 182* 228*    Recent Results (from the past 240 hour(s))  MRSA PCR Screening     Status: None   Collection Time: 03/03/15 10:36 AM  Result Value Ref Range Status   MRSA by PCR NEGATIVE NEGATIVE Final    Comment:        The GeneXpert MRSA Assay (FDA approved for NASAL specimens only), is one component of a comprehensive MRSA colonization surveillance program. It is not intended to diagnose MRSA infection nor to guide or monitor treatment for MRSA infections.      Studies: Ct Pelvis Wo Contrast  03/02/2015   CLINICAL DATA:  Right leg pain extending from the groin to the knee. Fall 1 week ago.  EXAM: CT PELVIS WITHOUT CONTRAST  TECHNIQUE: Multidetector CT imaging of the pelvis was performed following the standard protocol without intravenous contrast.  COMPARISON:  None.  FINDINGS: Visit acute nondisplaced fracture the right inferior pubic ramus. Suspected linear nondisplaced fracture of the right acetabulum involving the quadrilateral plate and extending cephalad, compatible with an anterior column fracture.  Degenerative SI joint arthropathy noted, right greater than left.  Grade 1 degenerative  anterolisthesis at L5-S1.  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  Retroverted uterus.  Scattered sigmoid colon diverticula.  IMPRESSION: 1. Right acetabular anterior column fracture, with involvement of the right inferior pubic ramus. The fracture is relatively nondisplaced. 2. Degenerative SI joint arthropathy noted, right greater than left. 3. Grade 1 degenerative anterolisthesis at L5-S1.   Electronically Signed   By: Gaylyn Rong M.D.   On: 03/02/2015 14:27   Dg Knee Complete 4 Views Right  03/02/2015   CLINICAL DATA:  Pain following fall 1 week prior  EXAM: RIGHT KNEE - COMPLETE 4+ VIEW  COMPARISON:  None.  FINDINGS: Frontal, lateral, and bilateral oblique views were obtained. There is no demonstrable fracture or dislocation. No joint effusion. There is moderate narrowing medially. There is mild spurring along the posterior aspect of the patellofemoral joint as well as spurring  medially and laterally. There are scattered foci of arterial vascular calcification. No erosive change.  IMPRESSION: Osteoarthritic change, most notably along the medial aspect. No fracture or effusion. Areas of arterial vascular calcification present.   Electronically Signed   By: Bretta Bang III M.D.   On: 03/02/2015 14:28    Scheduled Meds: . ARIPiprazole  5 mg Oral Daily  . aspirin EC  81 mg Oral Daily  . cefTRIAXone (ROCEPHIN)  IV  1 g Intravenous Q24H  . clopidogrel  75 mg Oral Daily  . dorzolamide-timolol  1 drop Both Eyes BID  . doxazosin  4 mg Oral Daily  . enoxaparin (LOVENOX) injection  30 mg Subcutaneous QHS  . insulin aspart  0-9 Units Subcutaneous TID WC  . isosorbide dinitrate  20 mg Oral TID  . levothyroxine  100 mcg Oral QAC breakfast  . pantoprazole  40 mg Oral Daily  . prednisoLONE acetate  1 drop Both Eyes QID  . sertraline  200 mg Oral QHS   Continuous Infusions:   Active Problems:   Diabetes mellitus type II, uncontrolled   Hypertension   CKD (chronic kidney  disease), stage II   Hypothyroidism   Sinus bradycardia   Hip fracture    Time spent: 30 min    Latitia Housewright, [facility]  [facility] Pager [phone]. If 7PM-7AM, please contact night-coverage at [email or web address], password [facility] 03/03/2015, 2:22 PM

## 2015-03-03 NOTE — Progress Notes (Signed)
Pt has accepted bed offer [facility]. SNF is [facility] for [facility] aythorization.   Cori RazorJamie Raeana Blinn LCSW [phone]

## 2015-03-03 NOTE — Clinical Social Work Placement (Signed)
   CLINICAL SOCIAL WORK PLACEMENT  NOTE  Date:  03/03/2015  Patient Details  Name: Taylor Padilla MRN: [id number] Date of Birth: 11-17-1945  Clinical Social Work is seeking post-discharge placement for this patient at the Skilled  Nursing Facility level of care (*CSW will initial, date and re-position this form in  chart as items are completed):  Yes   Patient/family provided with [facility] Clinical Social Work Department's list of facilities offering this level of care within the geographic area requested by the patient (or if unable, by the patient's family).  Yes   Patient/family informed of their freedom to choose among providers that offer the needed level of care, that participate in Medicare, Medicaid or managed care program needed by the patient, have an available bed and are willing to accept the patient.  Yes   Patient/family informed of [facility]'s ownership interest in [facility] and [facility], as well as of the fact that they are under no obligation to receive care at these facilities.  PASRR submitted to EDS on       PASRR number received on       Existing PASRR number confirmed on 03/03/15     FL2 transmitted to all facilities in geographic area requested by pt/family on 03/03/15     FL2 transmitted to all facilities within larger geographic area on       Patient informed that his/her managed care company has contracts with or will negotiate with certain facilities, including the following:        Yes   Patient/family informed of bed offers received.  [facility]     Physician recommends and patient chooses bed at      Patient to be transferred to   on  .  Patient to be transferred to facility by       Patient family notified on   of transfer.  Name of family member notified:        PHYSICIAN       Additional Comment:    _______________________________________________ Royetta AsalHaidinger, Cesario Weidinger Lee, LCSW  [phone] 03/03/2015, 1:58 PM

## 2015-03-03 NOTE — Care Management Note (Signed)
Case Management Note  Patient Details  Name: Taylor Padilla MRN: [id number] Date of Birth: [birth year]  Subjective/Objective:                   right acetabular anterior column fracture with involvement of the right inferior pubic ramus; UTI; Hypokalemia Action/Plan:  Discharge planning Expected Discharge Date:   (unknown)               Expected Discharge Plan:  Skilled Nursing Facility  In-House Referral:     Discharge planning Services  CM Consult  Post Acute Care Choice:    Choice offered to:     DME Arranged:    DME Agency:     HH Arranged:    HH Agency:     Status of Service:     Medicare Important Message Given:    Date Medicare IM Given:    Medicare IM give by:    Date Additional Medicare IM Given:    Additional Medicare Important Message give by:     If discussed at Long Length of Stay Meetings, dates discussed:    Additional Comments: CM notes pt to go to SNF; CSW arranging.  No other CM needs were communicated. Taylor Padilla, Taylor Speegle Christine, RN 03/03/2015, 11:22 AM

## 2015-03-04 DIAGNOSIS — N390 Urinary tract infection, site not specified: Secondary | ICD-10-CM

## 2015-03-04 DIAGNOSIS — N3000 Acute cystitis without hematuria: Secondary | ICD-10-CM

## 2015-03-04 DIAGNOSIS — N184 Chronic kidney disease, stage 4 (severe): Secondary | ICD-10-CM | POA: Diagnosis not present

## 2015-03-04 DIAGNOSIS — E1165 Type 2 diabetes mellitus with hyperglycemia: Secondary | ICD-10-CM | POA: Diagnosis not present

## 2015-03-04 DIAGNOSIS — S32401A Unspecified fracture of right acetabulum, initial encounter for closed fracture: Secondary | ICD-10-CM

## 2015-03-04 LAB — CBC
HCT: 31.5 % — ABNORMAL LOW (ref 36.0–46.0)
HEMOGLOBIN: 10.3 g/dL — AB (ref 12.0–15.0)
MCH: 29.7 pg (ref 26.0–34.0)
MCHC: 32.7 g/dL (ref 30.0–36.0)
MCV: 90.8 fL (ref 78.0–100.0)
PLATELETS: 285 10*3/uL (ref 150–400)
RBC: 3.47 MIL/uL — ABNORMAL LOW (ref 3.87–5.11)
RDW: 14.5 % (ref 11.5–15.5)
WBC: 7.7 10*3/uL (ref 4.0–10.5)

## 2015-03-04 LAB — BASIC METABOLIC PANEL
ANION GAP: 11 (ref 5–15)
BUN: 30 mg/dL — ABNORMAL HIGH (ref 6–20)
CO2: 26 mmol/L (ref 22–32)
CREATININE: 1.83 mg/dL — AB (ref 0.44–1.00)
Calcium: 9.2 mg/dL (ref 8.9–10.3)
Chloride: 101 mmol/L (ref 101–111)
GFR, EST AFRICAN AMERICAN: 31 mL/min — AB (ref >60)
GFR, EST NON AFRICAN AMERICAN: 27 mL/min — AB (ref >60)
GLUCOSE: 170 mg/dL — AB (ref 65–99)
POTASSIUM: 3.7 mmol/L (ref 3.5–5.1)
SODIUM: 138 mmol/L (ref 135–145)

## 2015-03-04 LAB — TSH: TSH: 2.746 u[IU]/mL (ref 0.350–4.500)

## 2015-03-04 LAB — HEMOGLOBIN A1C
HEMOGLOBIN A1C: 8.9 % — AB (ref 4.8–5.6)
Mean Plasma Glucose: 209 mg/dL

## 2015-03-04 LAB — GLUCOSE, CAPILLARY
Glucose-Capillary: 267 mg/dL — ABNORMAL HIGH (ref 65–99)
Glucose-Capillary: 137 mg/dL — ABNORMAL HIGH (ref 65–99)

## 2015-03-04 MED ORDER — TRAMADOL HCL 50 MG PO TABS: 50.0000 mg | ORAL_TABLET | Freq: Two times a day (BID) | ORAL | Status: DC | PRN

## 2015-03-04 MED ORDER — CIPROFLOXACIN HCL 250 MG PO TABS: 250.0000 mg | ORAL_TABLET | Freq: Two times a day (BID) | ORAL | Status: DC

## 2015-03-04 MED ORDER — OXYCODONE-ACETAMINOPHEN 5-325 MG PO TABS: 1.0000 | ORAL_TABLET | ORAL | Status: DC | PRN

## 2015-03-04 MED ORDER — METHOCARBAMOL 500 MG PO TABS: 500.0000 mg | ORAL_TABLET | Freq: Four times a day (QID) | ORAL | Status: DC | PRN

## 2015-03-04 NOTE — Progress Notes (Addendum)
Physical Therapy Treatment Patient Details Name: Taylor Padilla MRN: [id number] DOB: [birth year] Today's Date: 03/04/2015    History of Present Illness Taylor Padilla is a 69 y.o. female with prior h/o hypertension, DM, CAD, CHF, CKD Stage 2, hypothyroidism, Blind in both eyes reported had a mechanical fall ew days PTA. initial X RAYS shows negative for fracture, but as she had persistent pain, she was brought to ED 03/02/15. , . CT of the hip and pelvis showed right acetabular anterior column fracture with involvement of the right inferior pubic ramus. He was referred to medical service for pain control.     PT Comments    Patient requires extra time and frequent tactile guidance for mobility and ensuring that pt does not place weight through the R leg. Patient very participatory in  Therapy and appreciative for efforts to assist her.   Follow Up Recommendations  SNF;Supervision/Assistance - 24 hour     Equipment Recommendations  None recommended by PT    Recommendations for Other Services       Precautions / Restrictions Precautions Precautions: Fall Precaution Comments: legally blind, PATIENT DOES NOT WANT ANYONE TO MOVE HER right LEG. SHE WANTS TO DO IT  HERSSELF. Restrictions RLE Weight Bearing: Non weight bearing    Mobility  Bed Mobility     Rolling: Min assist Sidelying to sit: Min assist (with HOB raised to assist)     Sit to sidelying: Mod assist General bed mobility comments: using rail to roll; HOB raised to assist her with sitting up  Transfers Overall transfer level: Needs assistance Equipment used: Rolling walker (2 wheeled) Transfers: Sit to/from [employer] Sit to Stand: +2 safety/equipment;+2 physical assistance;Max assist Stand pivot transfers: +2 safety/equipment;Max assist;+2 physical assistance       General transfer comment: assistance to bring hands to walker and BSC rail.  3:1 brought up to her; had difficulty with NWB once and  assistance given to position RLE forward when sitting, assist to poser up from [facility], handover hand guidance for pushing from [facility] and bed and reaching for RW handles due to visual deficits  Ambulation/Gait                 Stairs            Wheelchair Mobility    Modified Rankin (Stroke Patients Only)       Balance     Sitting balance-Leahy Scale: Fair Sitting balance - Comments: able to sit at midline, able to lean to the L with control                            Cognition Arousal/Alertness: Awake/alert Behavior During Therapy: WFL for tasks assessed/performed Overall Cognitive Status: Within Functional Limits for tasks assessed                      Exercises      General Comments        Pertinent Vitals/Pain Pain Assessment: 0-10 Pain Score: 5  Pain Location: R hip and  pubis Pain Descriptors / Indicators: Tightness Pain Intervention(s): Limited activity within patient's tolerance;Monitored during session;Premedicated before session    Home Living Family/patient expects to be discharged to:: Skilled nursing facility               Additional Comments: from ALF    Prior Function Level of Independence: Independent with assistive device(s)  PT Goals (current goals can now be found in the care plan section) Acute Rehab PT Goals Patient Stated Goal: get back to being independent Progress towards PT goals: Progressing toward goals    Frequency  Min 3X/week    PT Plan Current plan remains appropriate    Co-evaluation PT/OT/SLP Co-Evaluation/Treatment: Yes Reason for Co-Treatment: Complexity of the patient's impairments (multi-system involvement);For patient/therapist safety PT goals addressed during session: Mobility/safety with mobility;Proper use of DME OT goals addressed during session: ADL's and self-care     End of Session   Activity Tolerance: Patient tolerated treatment well Patient left: in bed;with  call bell/phone within reach     Time:852-928-     Charges:  $Therapeutic Activity: 8-22 mins                    G Codes:      Rada HayHill, Ashlen Kiger Elizabeth 03/04/2015, 10:12 AM Blanchard KelchKaren Kermitt Harjo PT [phone]

## 2015-03-04 NOTE — Clinical Social Work Placement (Signed)
   CLINICAL SOCIAL WORK PLACEMENT  NOTE  Date:  03/04/2015  Patient Details  Name: Taylor Padilla MRN: [id number] Date of Birth: 01-06-1946  Clinical Social Work is seeking post-discharge placement for this patient at the Skilled  Nursing Facility level of care (*CSW will initial, date and re-position this form in  chart as items are completed):  Yes   Patient/family provided with [place] Clinical Social Work Department's list of facilities offering this level of care within the geographic area requested by the patient (or if unable, by the patient's family).  Yes   Patient/family informed of their freedom to choose among providers that offer the needed level of care, that participate in Medicare, Medicaid or managed care program needed by the patient, have an available bed and are willing to accept the patient.  Yes   Patient/family informed of [facility]'s ownership interest in [facility] and [facility], as well as of the fact that they are under no obligation to receive care at these facilities.  PASRR submitted to EDS on       PASRR number received on       Existing PASRR number confirmed on 03/03/15     FL2 transmitted to all facilities in geographic area requested by pt/family on 03/03/15     FL2 transmitted to all facilities within larger geographic area on       Patient informed that his/her managed care company has contracts with or will negotiate with certain facilities, including the following:        Yes   Patient/family informed of bed offers received.  [facility]     Physician recommends and patient chooses bed at      [facility] 03[facility].  Patient to be transferred to facility by PTAR     Patient family notified on 03/04/15 of transfer.  Name of family member notified:   (sister)     PHYSICIAN       Additional Comment: Pt is in agreement with d/c to [facility] today. [facility] transport  is required. Pt is aware out of pocket cots may be associated with PTAR transport.NSG reviewed d/c summary, scripts, avs. Scripts included in d/c packet.   _______________________________________________ Royetta AsalHaidinger, [name], LCSW 03/04/2015, 2:55 PM

## 2015-03-04 NOTE — Discharge Summary (Signed)
Physician Discharge Summary  Maekayla Giorgio ZOX:[id number] DOB: 1946/05/18 DOA: 03/02/2015  PCP: Florentina Jenny, MD  Admit date: 03/02/2015 Discharge date: 03/04/2015  Recommendations for Outpatient Follow-up:  1. Transfer to SNF for ongoing PT/OT.  Non weight bearing RLE until follow up with orthopedics.  F/u with Dr. Magnus Ivan in 10 days.  Please call to schedule appointment.   2. F/u urine culture and adjust antibiotics if needed.  F/u A1c and daily CBG to determine if she needs further treatment for diabetes 3. Continue daily weights and notify Dr. Gala Romney if she gains more than 3-lbs in one day or 5-lbs at all from time of discharge.   4. Please schedule her an appointment with a local gastroenterologist for her iron deficiency anemia with occult positive stool.    Discharge Diagnoses:  Principal Problem:   Right acetabular fracture Active Problems:   Diabetes mellitus type II, uncontrolled   Hypertension   CKD (chronic kidney disease) stage 4, GFR 15-29 ml/min   Hypothyroidism   Sinus bradycardia   Coronary artery disease involving native coronary artery of native heart without angina pectoris   UTI (urinary tract infection)   Discharge Condition: stable, improved  Diet recommendation: diabetic, low sodium  Wt Readings from Last 3 Encounters:  03/03/15 68.543 kg (151 lb 1.8 oz)  12/31/14 66.225 kg (146 lb)  12/07/14 67.813 kg (149 lb 8 oz)    History of present illness:  The patient is a 69-year-old female with history of hypertension, coronary artery disease, congestive heart failure, diabetes mellitus type 2, CKD stage IV, hypothyroidism, blindness who presented with a mechanical fall which resulted in persistent right hip pain. Initial x-rays were negative for fracture however follow-up CT of the hip and pelvis demonstrated a right acetabular anterior column fracture with involvement of the right inferior pubic ramus. Her labs were notable for low potassium and a stable  creatinine. Urinalysis was consistent with urinary tract infection. She was started on antibiotics and admitted for pain control.  Hospital Course:   Right acetabular fracture from mechanical fall. EDP consulted orthopedics who recommended non surgical treatment with pain control and physical therapy. She is NWB RLE.  She was evaluated by PT/OT who recommended SNF for rehabilitation.  Her pain was controlled with low dose percocet and muscle relaxants.    UTI, present at time of admission.  She was started on empiric ceftriaxone and urine culture is pending.  Recommend ciprofloxacin as outpatient to complete a 7-day course pending urine culture results.  Hypokalemia, likely secondary to lasix use.  Started oral repletion.  Mag level was normal.   Stage 4 CKD, creatinine remained baseline near 1.8mg /dl.  Hypertension, blood pressure remained stable on doxazosin.   CAD, chest pain free.  She continued aspirin and plavix. She is not on BB secondary to hx of sinus bradycardia.  She resumed high dose statin (atorva 80mg ).   Chronic diastolic CHF, followed by heart failure clinic.  Her lasix was initially held due to concern for dehydration and hypovolemia, but was resumed quickly.  She should continue lasix and potassium and follow up with the heart failure clinic in 1 month or sooner if needed.  Continue daily weights and notify Dr. Gala Romney if she gains more than 3-lbs in one day or 5-lbs at all from time of discharge.    Diabetes mellitus type 2 with renal manifestations.  A1c pending at time of discharge.  Normally diet controlled.  She was somewhat hyperglycemic and was given low dose SSI during  admission but this is likely secondary to stress from her fracture and should gradually improve.  Continue once daily CBGs.    Hyponatremia, probably from dehydration, resolved.  Mild iron deficiency and hypothyroid-related anemia.  Continued iron supplementation.  GI f/u as outpatient to  investigate source of iron deficiency.  Occult stool POSITIVE.    Hypothyroidism, stable.  TSH 2.746.  Continue synthroid  Consultants:  Dr. Wynn Banker, orthopedics  Procedures:  CT pelvis: Right acetabular anterior column fracture with involvement of the right inferior pubic ramus, relatively nondisplaced, degenerative SI arthropathy, and grade 1 anterlisthesis L5-S1  Antibiotics:  Ceftriaxone 6/28 > 6/30  cipro 6/30 >   Discharge Exam: Filed Vitals:   03/04/15 0617  BP: 133/74  Pulse: 58  Temp: 99 F (37.2 C)  Resp: 16   Filed Vitals:   03/03/15 0525 03/03/15 1500 03/03/15 2000 03/04/15 0617  BP: 138/68 189/61 158/72 133/74  Pulse: 58 56 61 58  Temp: 98.6 F (37 C) 99.5 F (37.5 C) 99.1 F (37.3 C) 99 F (37.2 C)  TempSrc: Oral  Oral Oral  Resp: Height:      Weight:      SpO2: 97% 97% 98% 97%     General: Adult female, blunted affect, No acute distress, blind  HEENT: NCAT, MMM  Cardiovascular: RRR, nl S1, S2, 3/6 systolic murmur, 2+ pulses, warm extremities  Respiratory: Rales at bilateral bases that clear somewhat with repeat respirations.  No increased WOB  Abdomen: NABS, soft, NT/ND  MSK: Normal tone and bulk, no LEE.  Pain with passive and active movement of the RLE.  2+ pedal pulses and sensation intact to light touch.    Neuro: Grossly intact  Discharge Instructions      Discharge Instructions    (HEART FAILURE PATIENTS) Call MD:  Anytime you have any of the following symptoms: 1) 3 pound weight gain in 24 hours or 5 pounds in 1 week 2) shortness of breath, with or without a dry hacking cough 3) swelling in the hands, feet or stomach 4) if you have to sleep on extra pillows at night in order to breathe.    Complete by:  As directed      Call MD for:  difficulty breathing, headache or visual disturbances    Complete by:  As directed      Call MD for:  extreme fatigue    Complete by:  As directed      Call MD for:  hives     Complete by:  As directed      Call MD for:  persistant dizziness or light-headedness    Complete by:  As directed      Call MD for:  persistant nausea and vomiting    Complete by:  As directed      Call MD for:  severe uncontrolled pain    Complete by:  As directed      Call MD for:  temperature >100.4    Complete by:  As directed      Diet - low sodium heart healthy    Complete by:  As directed      Diet Carb Modified    Complete by:  As directed      Increase activity slowly    Complete by:  As directed      Non weight bearing    Complete by:  As directed   Laterality:  right  Extremity:  Lower  Medication List    STOP taking these medications        carvedilol 12.5 MG tablet  Commonly known as:  COREG      TAKE these medications        amLODipine 10 MG tablet  Commonly known as:  NORVASC  Take 10 mg by mouth daily.     ARIPiprazole 5 MG tablet  Commonly known as:  ABILIFY  Take 5 mg by mouth daily.     ARTIFICIAL TEAR SOLUTION OP  Place 1 drop into the left eye 4 (four) times daily.     aspirin 81 MG EC tablet  Take 1 tablet (81 mg total) by mouth daily.     atorvastatin 80 MG tablet  Commonly known as:  LIPITOR  Take 80 mg by mouth at bedtime.     ciprofloxacin 250 MG tablet  Commonly known as:  CIPRO  Take 1 tablet (250 mg total) by mouth 2 (two) times daily.     clopidogrel 75 MG tablet  Commonly known as:  PLAVIX  Take 75 mg by mouth daily.     dorzolamide-timolol 22.3-6.8 MG/ML ophthalmic solution  Commonly known as:  COSOPT  Place 1 drop into both eyes every 12 (twelve) hours.     doxazosin 4 MG tablet  Commonly known as:  CARDURA  Take 1 tablet (4 mg total) by mouth daily.     ferrous sulfate 325 (65 FE) MG tablet  Take 1 tablet (325 mg total) by mouth 3 (three) times daily with meals.     furosemide 40 MG tablet  Commonly known as:  LASIX  Take 40-80 mg by mouth 2 (two) times daily. 2 tab in the morning and 1 in the  evening     hydrALAZINE 100 MG tablet  Commonly known as:  APRESOLINE  Take 1 tablet (100 mg total) by mouth every 8 (eight) hours.     isosorbide dinitrate 10 MG tablet  Commonly known as:  ISORDIL  Take 2 tablets (20 mg total) by mouth 3 (three) times daily.     lansoprazole 30 MG capsule  Commonly known as:  PREVACID  Take 30 mg by mouth daily.     levothyroxine 100 MCG tablet  Commonly known as:  SYNTHROID, LEVOTHROID  Take 1 tablet (100 mcg total) by mouth daily before breakfast.     Melatonin 3 MG Tabs  Take 3 mg by mouth at bedtime.     methocarbamol 500 MG tablet  Commonly known as:  ROBAXIN  Take 1 tablet (500 mg total) by mouth every 6 (six) hours as needed for muscle spasms.     oxyCODONE-acetaminophen 5-325 MG per tablet  Commonly known as:  PERCOCET/ROXICET  Take 1 tablet by mouth every 4 (four) hours as needed for severe pain.     prednisoLONE acetate 1 % ophthalmic suspension  Commonly known as:  PRED FORTE  Place 1 drop into the left eye 4 (four) times daily.     ranitidine 150 MG tablet  Commonly known as:  ZANTAC  Take 150 mg by mouth 2 (two) times daily as needed for heartburn. indigestion     sertraline 100 MG tablet  Commonly known as:  ZOLOFT  Take 200 mg by mouth at bedtime.     traMADol 50 MG tablet  Commonly known as:  ULTRAM  Take 1 tablet (50 mg total) by mouth every 12 (twelve) hours as needed for moderate pain.       Follow-up Information  Follow up with Florentina Jenny, MD In 2 weeks.   Specialty:  Family Medicine   Why:  needs referral to GI for occult positive iron deficiency anemia   Contact information:   [address] [place] [address] [phone]       Follow up with Kathryne Hitch, MD. Schedule an appointment as soon as possible for a visit in 2 weeks.   Specialty:  Orthopedic Surgery   Contact information:   [address] [place] [address] [phone]       Follow up with  Melvia Heaps, MD. Schedule an appointment as soon as possible for a visit in 1 month.   Specialty:  Gastroenterology   Contact information:   [address] [place] [address] [phone]        The results of significant diagnostics from this hospitalization (including imaging, microbiology, ancillary and laboratory) are listed below for reference.    Significant Diagnostic Studies: Ct Pelvis Wo Contrast  03/02/2015   CLINICAL DATA:  Right leg pain extending from the groin to the knee. Fall 1 week ago.  EXAM: CT PELVIS WITHOUT CONTRAST  TECHNIQUE: Multidetector CT imaging of the pelvis was performed following the standard protocol without intravenous contrast.  COMPARISON:  None.  FINDINGS: Visit acute nondisplaced fracture the right inferior pubic ramus. Suspected linear nondisplaced fracture of the right acetabulum involving the quadrilateral plate and extending cephalad, compatible with an anterior column fracture.  Degenerative SI joint arthropathy noted, right greater than left.  Grade 1 degenerative anterolisthesis at L5-S1.  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  Retroverted uterus.  Scattered sigmoid colon diverticula.  IMPRESSION: 1. Right acetabular anterior column fracture, with involvement of the right inferior pubic ramus. The fracture is relatively nondisplaced. 2. Degenerative SI joint arthropathy noted, right greater than left. 3. Grade 1 degenerative anterolisthesis at L5-S1.   Electronically Signed   By: Gaylyn Rong M.D.   On: 03/02/2015 14:27   Dg Knee Complete 4 Views Right  03/02/2015   CLINICAL DATA:  Pain following fall 1 week prior  EXAM: RIGHT KNEE - COMPLETE 4+ VIEW  COMPARISON:  None.  FINDINGS: Frontal, lateral, and bilateral oblique views were obtained. There is no demonstrable fracture or dislocation. No joint effusion. There is moderate narrowing medially. There is mild spurring along the posterior aspect of the patellofemoral joint as  well as spurring medially and laterally. There are scattered foci of arterial vascular calcification. No erosive change.  IMPRESSION: Osteoarthritic change, most notably along the medial aspect. No fracture or effusion. Areas of arterial vascular calcification present.   Electronically Signed   By: Bretta Bang III M.D.   On: 03/02/2015 14:28    Microbiology: Recent Results (from the past 240 hour(s))  MRSA PCR Screening     Status: None   Collection Time: 03/03/15 10:36 AM  Result Value Ref Range Status   MRSA by PCR NEGATIVE NEGATIVE Final    Comment:        The GeneXpert MRSA Assay (FDA approved for NASAL specimens only), is one component of a comprehensive MRSA colonization surveillance program. It is not intended to diagnose MRSA infection nor to guide or monitor treatment for MRSA infections.      Labs: Basic Metabolic Panel:  Recent Labs Lab 03/02/15 1536 03/03/15 0505 03/04/15 0535  NA 133* 137 138  K 2.8* 2.9* 3.7  CL 93* 100* 101  CO2 26 28 26   GLUCOSE 247* 200* 170*  BUN 38* 35* 30*  CREATININE 2.10* 2.01* 1.83*  CALCIUM 8.9 8.5* 9.2  MG 2.0  --   --    Liver Function Tests: No results for input(s): AST, ALT, ALKPHOS, BILITOT, PROT, ALBUMIN in the last 168 hours. No results for input(s): LIPASE, AMYLASE in the last 168 hours. No results for input(s): AMMONIA in the last 168 hours. CBC:  Recent Labs Lab 03/02/15 1536 03/03/15 0505 03/04/15 0535  WBC 8.3 9.2 7.7  NEUTROABS 7.0  --   --   HGB 11.2* 10.3* 10.3*  HCT 34.6* 31.5* 31.5*  MCV 90.8 90.8 90.8  PLT 302 283 285   Cardiac Enzymes: No results for input(s): CKTOTAL, CKMB, CKMBINDEX, TROPONINI in the last 168 hours. BNP: BNP (last 3 results)  Recent Labs  10/31/14 1645 11/19/14 1215 12/07/14 1530  BNP 1931.3* 2140.8* 162.9*    ProBNP (last 3 results) No results for input(s): PROBNP in the last 8760 hours.  CBG:  Recent Labs Lab 03/03/15 0752 03/03/15 1222 03/03/15 1643  03/03/15 2155 03/04/15 0822  GLUCAP 182* 228* 181* 138* 267*    Time coordinating discharge: 35 minutes  Signed:  Jadea Shiffer  [facility] 03/04/2015, 10:49 AM

## 2015-03-04 NOTE — Evaluation (Signed)
Occupational Therapy Evaluation Patient Details Name: Taylor Padilla MRN: [id number] DOB: 1945/10/18 Today's Date: 03/04/2015    History of Present Illness Taylor Padilla is a 69 y.o. female with prior h/o hypertension, DM, CAD, CHF, CKD Stage 2, hypothyroidism, Blind in both eyes reported had a mechanical fall few days PTA. initial X RAYS shows negative for fracture, but as she had persistent pain, she was brought to ED 03/02/15. , . CT of the hip and pelvis showed right acetabular anterior column fracture with involvement of the right inferior pubic ramus. He was referred to medical service for pain control.    Clinical Impression   Pt was admitted for the above.  She is NWB on RLE.  Pt was mod I prior to admission.  She has visual impairment--unsure she will be able to use AE; if time, we will look at this in acute and if not, will defer to next venue of care.  Pt is fearful of falling--will continue to focus on transfer to 3:1 and standing for adls in acute setting.  Goals are for min to mod A and pt currently needs max A x 2 for this    Follow Up Recommendations  SNF    Equipment Recommendations  3 in 1 bedside comode    Recommendations for Other Services       Precautions / Restrictions Precautions Precautions: Fall Precaution Comments: legally blind, PATIENT DOES NOT WANT ANYONE TO MOVE HER right LEG. SHE WANTS TO DO IT  HERSSELF. Restrictions RLE Weight Bearing: Non weight bearing      Mobility Bed Mobility     Rolling: Min assist Sidelying to sit: Min assist (with HOB raised to assist)     Sit to sidelying: Mod assist General bed mobility comments: using rail to roll; HOB raised to assist her with sitting up  Transfers Overall transfer level: Needs assistance Equipment used: Rolling walker (2 wheeled) Transfers: Sit to/from [employer] Sit to Stand: +2 safety/equipment;+2 physical assistance;Max assist Stand pivot transfers: +2  safety/equipment;Max assist;+2 physical assistance       General transfer comment: assistance to bring hands to walker and BSC rail.  3:1 brought up to her; had difficulty with NWB once and assistance given to position RLE forward when sitting    Balance     Sitting balance-Leahy Scale: Fair Sitting balance - Comments: able to sit at midline, able to lean to the L with control                                    ADL Overall ADL's : Needs assistance/impaired Eating/Feeding: Set up;Sitting   Grooming: Set up;Sitting;Supervision/safety   Upper Body Bathing: Sitting;Set up;Supervision/ safety   Lower Body Bathing: +2 for physical assistance;Sit to/from stand;Maximal assistance   Upper Body Dressing : Minimal assistance;Sitting   Lower Body Dressing: Total assistance;+2 for physical assistance;Sit to/from stand   Toilet Transfer: +2 for physical assistance;BSC;Stand-pivot;Maximal assistance   Toileting- Clothing Manipulation and Hygiene: Set up;Sitting/lateral lean         General ADL Comments: pt does not like to have RLE touched; OK with support at foot when needed for back to bed.  Pt very nervous with SPT:  she has impaired vision--can only see shadows.  Pt had difficulty maintaining NWB at times, assisted with bringing foot forward when sitting.       Vision     Perception  Praxis      Pertinent Vitals/Pain Pain Assessment: 0-10 Pain Score: 5  Pain Location: R hip and  pubis Pain Descriptors / Indicators: Tightness Pain Intervention(s): Limited activity within patient's tolerance;Monitored during session;Premedicated before session     Hand Dominance Right   Extremity/Trunk Assessment Upper Extremity Assessment Upper Extremity Assessment: Overall WFL for tasks assessed (c/o soreness R elbow to shoulder from fall)           Communication Communication Communication: No difficulties   Cognition Arousal/Alertness: Awake/alert Behavior  During Therapy: WFL for tasks assessed/performed Overall Cognitive Status: Within Functional Limits for tasks assessed                     General Comments       Exercises       Shoulder Instructions      Home Living Family/patient expects to be discharged to:: Skilled nursing facility                                 Additional Comments: from ALF      Prior Functioning/Environment Level of Independence: Independent with assistive device(s)             OT Diagnosis: Generalized weakness;Acute pain   OT Problem List: Decreased strength;Decreased activity tolerance;Impaired balance (sitting and/or standing);Impaired vision/perception;Decreased knowledge of use of DME or AE;Pain   OT Treatment/Interventions: Self-care/ADL training;DME and/or AE instruction;Patient/family education;Therapeutic activities    OT Goals(Current goals can be found in the care plan section) Acute Rehab OT Goals Patient Stated Goal: get back to being independent OT Goal Formulation: With patient Time For Goal Achievement: 03/11/15 Potential to Achieve Goals: Good ADL Goals Pt Will Transfer to Toilet: with mod assist;bedside commode;stand pivot transfer (maintaining NWB) Additional ADL Goal #1: Pt will stand for 2 minutes with min A for LB adls  OT Frequency: Min 2X/week   Barriers to D/C:            Co-evaluation PT/OT/SLP Co-Evaluation/Treatment: Yes Reason for Co-Treatment: For patient/therapist safety PT goals addressed during session: Mobility/safety with mobility OT goals addressed during session: ADL's and self-care      End of Session    Activity Tolerance: Patient tolerated treatment well Patient left: in bed;with call bell/phone within reach   Time: [id number] OT Time Calculation (min): 25 min Charges:  OT General Charges $OT Visit: 1 Procedure OT Evaluation $Initial OT Evaluation Tier I: 1 Procedure G-Codes: OT G-codes **NOT FOR INPATIENT  CLASS** Functional Assessment Tool Used: clinical observation and judgment Functional Limitation: Self care Self Care Current Status ([id number]): At least 80 percent but less than 100 percent impaired, limited or restricted Self Care Goal Status ([id number]): At least 40 percent but less than 60 percent impaired, limited or restricted  [facility] CenterENCER,Ariyannah Pauling 03/04/2015, 10:11 AM  Marica OtterMaryellen Clee Pandit, OTR/L [phone] 03/04/2015

## 2015-03-05 ENCOUNTER — Telehealth: Payer: Self-pay | Admitting: Internal Medicine

## 2015-03-05 ENCOUNTER — Non-Acute Institutional Stay: Payer: Medicare PPO | Admitting: Adult Health

## 2015-03-05 ENCOUNTER — Telehealth: Payer: Self-pay | Admitting: Gastroenterology

## 2015-03-05 ENCOUNTER — Encounter: Payer: Self-pay | Admitting: Adult Health

## 2015-03-05 DIAGNOSIS — N184 Chronic kidney disease, stage 4 (severe): Secondary | ICD-10-CM | POA: Diagnosis not present

## 2015-03-05 DIAGNOSIS — I5032 Chronic diastolic (congestive) heart failure: Secondary | ICD-10-CM | POA: Diagnosis not present

## 2015-03-05 DIAGNOSIS — I159 Secondary hypertension, unspecified: Secondary | ICD-10-CM | POA: Diagnosis not present

## 2015-03-05 DIAGNOSIS — N3000 Acute cystitis without hematuria: Secondary | ICD-10-CM | POA: Diagnosis not present

## 2015-03-05 DIAGNOSIS — S32401S Unspecified fracture of right acetabulum, sequela: Secondary | ICD-10-CM

## 2015-03-05 DIAGNOSIS — E1165 Type 2 diabetes mellitus with hyperglycemia: Secondary | ICD-10-CM | POA: Diagnosis not present

## 2015-03-05 DIAGNOSIS — E038 Other specified hypothyroidism: Secondary | ICD-10-CM

## 2015-03-05 DIAGNOSIS — E034 Atrophy of thyroid (acquired): Secondary | ICD-10-CM

## 2015-03-05 DIAGNOSIS — I2510 Atherosclerotic heart disease of native coronary artery without angina pectoris: Secondary | ICD-10-CM

## 2015-03-05 DIAGNOSIS — G4700 Insomnia, unspecified: Secondary | ICD-10-CM | POA: Diagnosis not present

## 2015-03-05 DIAGNOSIS — F329 Major depressive disorder, single episode, unspecified: Secondary | ICD-10-CM

## 2015-03-05 DIAGNOSIS — K254 Chronic or unspecified gastric ulcer with hemorrhage: Secondary | ICD-10-CM | POA: Diagnosis not present

## 2015-03-05 DIAGNOSIS — IMO0002 Reserved for concepts with insufficient information to code with codable children: Secondary | ICD-10-CM

## 2015-03-05 DIAGNOSIS — E785 Hyperlipidemia, unspecified: Secondary | ICD-10-CM

## 2015-03-05 DIAGNOSIS — F32A Depression, unspecified: Secondary | ICD-10-CM

## 2015-03-05 DIAGNOSIS — D6489 Other specified anemias: Secondary | ICD-10-CM

## 2015-03-05 LAB — URINE CULTURE: Culture: >=100000

## 2015-03-05 LAB — HEMOGLOBIN A1C
HEMOGLOBIN A1C: 8.9 % — AB (ref 4.8–5.6)
MEAN PLASMA GLUCOSE: 209 mg/dL

## 2015-03-05 MED ORDER — CEPHALEXIN 250 MG PO CAPS: 250.0000 mg | ORAL_CAPSULE | Freq: Two times a day (BID) | ORAL | Status: AC

## 2015-03-05 NOTE — Telephone Encounter (Signed)
Asked [facility] to discontinue her ciprofloxacin and start keflex for 5 more days.

## 2015-03-05 NOTE — Telephone Encounter (Signed)
Appointment scheduled for 03/31/15 at 9:30, arrive at 9:15. Called back but got the voicemail. Left this information on Jessica's voicemail.

## 2015-03-07 NOTE — Progress Notes (Signed)
Patient ID: Taylor Padilla, female   DOB: June 11, 1946, 69 y.o.   MRN: [id number]    03/05/15  Facility:  Nursing Home Location:  [facility] Room Number: 1001-1 LEVEL OF CARE:  SNF (31)   Chief Complaint  Patient presents with  . Hospitalization Follow-up    Right acetabular fracture S/P ORIF, UTI, CKD stage IV, hypertension, CAD, chronic diastolic CHF, diabetes mellitus, hypothyroidism, depression, anemia, GI bleed, insomnia and hyperlipidemia      HISTORY OF PRESENT ILLNESS:  This is a 69 year old female who has been admitted to [facility] on 03/04/15 from [facility]. She has PMH of hypertension, CAD, CHF, diabetes mellitus type 2, CKD stage IV, hypothyroidism and blindness. She had a mechanical fall which resulted in persistent right hip pain. CT of the hip shows a right acetabular anterior column fracture with involvement of the right inferior pubic ramus. Orthopedic was consulted and recommended nonsurgical treatment and for pain management. She was noted to be hypokalemic K2.8 and was supplemented.  Urinalysis was consistent for UTI and was given ceftriaxone 6/28-6/30. She was discharged on Cipro and has been changed to Keflex due to result of urine culture.  She has been admitted for a short-term rehabilitation.  PAST MEDICAL HISTORY:  Past Medical History  Diagnosis Date  . Hypertension   . CAD (coronary artery disease)   . 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 Macular edema     hx  . Glaucoma, both eyes   . High cholesterol   . CHF (congestive heart failure)   . Chronic kidney disease (CKD), stage II (mild)     [name] 10/07/2014  . Old myocardial infarct     "I was told I've had one; don't know when" (10/08/2014)  . Pneumonia 1990's X 1  . Hypothyroidism   . Type II diabetes mellitus   . Anemia   . History of blood transfusion 2003    "related to [facility]"  . Stroke     "I've been told I've had one; I don't remember having any reaction  from it at all"; denies residual on 10/08/2014    CURRENT MEDICATIONS: Reviewed per MAR/see medication list  Allergies  Allergen Reactions  . Epinephrine Other (See Comments)    Patient says she is not allergic to epi, but that it makes her jittery.  WILL RECEIVE IF NEEDED IN AN EMERGENCY.     REVIEW OF SYSTEMS:  GENERAL: no change in appetite, no fatigue, no weight changes, no fever, chills or weakness RESPIRATORY: no cough, SOB, DOE, wheezing, hemoptysis CARDIAC: no chest pain, edema or palpitations GI: no abdominal pain, diarrhea, constipation, heart burn, nausea or vomiting  PHYSICAL EXAMINATION  GENERAL: no acute distress, normal body habitus EYES: blind NECK: supple, trachea midline, no neck masses, no thyroid tenderness, no thyromegaly LYMPHATICS: no LAN in the neck, no supraclavicular LAN RESPIRATORY: breathing is even & unlabored, BS CTAB CARDIAC: RRR, + systolic murmur,no extra heart sounds, no edema GI: abdomen soft, normal BS, no masses, no tenderness, no hepatomegaly, no splenomegaly EXTREMITIES: Able to move 4 extremities PSYCHIATRIC: the patient is alert & oriented to person, affect & behavior appropriate  LABS/RADIOLOGY: Labs reviewed: Basic Metabolic Panel:  Recent Labs  40/98/11 0048  03/02/15 1536 03/03/15 0505 03/04/15 0535  NA 136  < > 133* 137 138  K 3.6  < > 2.8* 2.9* 3.7  CL 105  < > 93* 100* 101  CO2 24  < > 26  28 26  GLUCOSE 242*  < > 247* 200* 170*  BUN 35*  < > 38* 35* 30*  CREATININE 1.90*  < > 2.10* 2.01* 1.83*  CALCIUM 8.1*  < > 8.9 8.5* 9.2  MG 2.3  --  2.0  --   --   < > = values in this interval not displayed. Liver Function Tests:  Recent Labs  11/01/14 0048  AST 17  ALT 15  ALKPHOS 67  BILITOT 0.6  PROT 5.9*  ALBUMIN 2.8*   CBC:  Recent Labs  10/31/14 1645  11/19/14 1215  03/02/15 1536 03/03/15 0505 03/04/15 0535  WBC 7.3  < > 6.8  < > 8.3 9.2 7.7  NEUTROABS 5.7  --  5.2  --  7.0  --   --   HGB 7.8*  < > 7.6*   < > 11.2* 10.3* 10.3*  HCT 24.9*  < > 24.7*  < > 34.6* 31.5* 31.5*  MCV 90.5  < > 91.1  < > 90.8 90.8 90.8  PLT 243  < > 304  < > 302 283 285  < > = values in this interval not displayed.  Cardiac Enzymes:  Recent Labs  10/07/14 2000 10/08/14 0116 10/08/14 0744  TROPONINI <0.03 <0.03 <0.03   CBG:  Recent Labs  03/03/15 2155 03/04/15 0822 03/04/15 1203  GLUCAP 138* 267* 137*     Ct Pelvis Wo Contrast  03/02/2015   CLINICAL DATA:  Right leg pain extending from the groin to the knee. Fall 1 week ago.  EXAM: CT PELVIS WITHOUT CONTRAST  TECHNIQUE: Multidetector CT imaging of the pelvis was performed following the standard protocol without intravenous contrast.  COMPARISON:  None.  FINDINGS: Visit acute nondisplaced fracture the right inferior pubic ramus. Suspected linear nondisplaced fracture of the right acetabulum involving the quadrilateral plate and extending cephalad, compatible with an anterior column fracture.  Degenerative SI joint arthropathy noted, right greater than left.  Grade 1 degenerative anterolisthesis at L5-S1.  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  Retroverted uterus.  Scattered sigmoid colon diverticula.  IMPRESSION: 1. Right acetabular anterior column fracture, with involvement of the right inferior pubic ramus. The fracture is relatively nondisplaced. 2. Degenerative SI joint arthropathy noted, right greater than left. 3. Grade 1 degenerative anterolisthesis at L5-S1.   Electronically Signed   By: Gaylyn RongWalter  Liebkemann M.D.   On: 03/02/2015 14:27   Dg Knee Complete 4 Views Right  03/02/2015   CLINICAL DATA:  Pain following fall 1 week prior  EXAM: RIGHT KNEE - COMPLETE 4+ VIEW  COMPARISON:  None.  FINDINGS: Frontal, lateral, and bilateral oblique views were obtained. There is no demonstrable fracture or dislocation. No joint effusion. There is moderate narrowing medially. There is mild spurring along the posterior aspect of the patellofemoral joint as  well as spurring medially and laterally. There are scattered foci of arterial vascular calcification. No erosive change.  IMPRESSION: Osteoarthritic change, most notably along the medial aspect. No fracture or effusion. Areas of arterial vascular calcification present.   Electronically Signed   By: Bretta BangWilliam  Woodruff III M.D.   On: 03/02/2015 14:28    ASSESSMENT/PLAN:  Right acetabular fracture - follow-up with Dr. Magnus IvanBlackman in 10 days; orthopedic recommended nonsurgical treatment with pain control and PT; nonweightbearing on right lower extremity; continue Percocet 5/325 mg 1 tab by mouth every 4 hours when necessary and tramadol 50 mg 1 tab by mouth twice a day when necessary for pain; and Robaxin 500 mg  1 tab by mouth every 6 hours when necessary for muscle spasm  UTI - was started on ceftriaxone and discharged on Cipro; change to Keflex 250 mg 1 tab by mouth 3 times a day 7 days  Chronic kidney disease stage IV - creatinine 1.83; will monitor  Hypertension - continue doxazosin 4 mg 1 tab by mouth daily, hydralazine 100 mg 1 tab by mouth every 8 hours, amlodipine 10 mg 1 tab by mouth daily and Lasix 80 mg in a.m. and 40 mg in evening  CAD - stable; continue isosorbide dinitrate 10 mg 2 tabs = 20 mg by mouth 3 times a day, aspirin 81 mg 1 tab by mouth daily and Plavix 75 mg by mouth daily; not on beta blocker due to history of bradycardia  Chronic diastolic CHF - continue Lasix 80 mg by mouth in a.m. and 40 mg in evening and isosorbide dinitrate 10 mg  take 2 tabs  = 20 mg by mouth TID; daily weights and notify M.D./NP for pain 3 pounds in 1 day and 5 pounds from time of discharge; follow-up in heart failure clinic in 1 month with Dr. Gala Romney  Diabetes mellitus, type II - hemoglobin A1c 8.9; normally diet-controlled; continue CBG daily  Hypothyroidism - TSH 2.746; continue Synthroid 100 g 1 tab by mouth daily  Depression - mood is stable; continue Abilify 5 mg 1 tab by mouth daily and Zoloft  100 mg 2 tabs = 200 mg by mouth daily at bedtime  Anemia, iron deficiency - hemoglobin 10.3; continue ferrous sulfate 325 mg 1 tab by mouth twice a day  GI bleed - (+) stool occult blood; continue omeprazole 20 mg 1 capsule by mouth daily and Zantac 150 mg 1 tab by mouth twice a day when necessary; follow-up with Dr. Melvia Heaps, gastroenterology, in 1 month  Insomnia - continue melatonin 3 mg 1 tab by mouth daily at bedtime  Hyperlipidemia - continue atorvastatin 80 mg 1 tab by mouth daily at bedtime    Goals of care:  Short-term rehabilitation   Labs/test ordered:  CBC and CMP   Spent 50 minutes in patient care.    [facility], NP [employer] [phone]

## 2015-03-09 LAB — CBC AND DIFFERENTIAL
HCT: 31 % — AB (ref 36–46)
Hemoglobin: 10.3 g/dL — AB (ref 12.0–16.0)
Platelets: 285 10*3/uL (ref 150–399)
WBC: 8.5 10*3/mL

## 2015-03-09 LAB — BASIC METABOLIC PANEL
BUN: 43 mg/dL — AB (ref 4–21)
Creatinine: 2 mg/dL — AB (ref 0.5–1.1)
Glucose: 191 mg/dL
Potassium: 3.4 mmol/L (ref 3.4–5.3)
SODIUM: 134 mmol/L — AB (ref 137–147)

## 2015-03-09 LAB — HEPATIC FUNCTION PANEL
ALT: 13 U/L (ref 7–35)
AST: 16 U/L (ref 13–35)
Alkaline Phosphatase: 76 U/L (ref 25–125)
Bilirubin, Total: 0.4 mg/dL

## 2015-03-10 ENCOUNTER — Encounter (HOSPITAL_COMMUNITY): Payer: Self-pay

## 2015-03-10 ENCOUNTER — Non-Acute Institutional Stay: Payer: Medicare PPO | Admitting: Internal Medicine

## 2015-03-10 DIAGNOSIS — F322 Major depressive disorder, single episode, severe without psychotic features: Secondary | ICD-10-CM

## 2015-03-10 DIAGNOSIS — F329 Major depressive disorder, single episode, unspecified: Secondary | ICD-10-CM

## 2015-03-10 DIAGNOSIS — I2510 Atherosclerotic heart disease of native coronary artery without angina pectoris: Secondary | ICD-10-CM | POA: Diagnosis not present

## 2015-03-10 DIAGNOSIS — E038 Other specified hypothyroidism: Secondary | ICD-10-CM | POA: Diagnosis not present

## 2015-03-10 DIAGNOSIS — E1165 Type 2 diabetes mellitus with hyperglycemia: Secondary | ICD-10-CM

## 2015-03-10 DIAGNOSIS — S32401S Unspecified fracture of right acetabulum, sequela: Secondary | ICD-10-CM | POA: Diagnosis not present

## 2015-03-10 DIAGNOSIS — E785 Hyperlipidemia, unspecified: Secondary | ICD-10-CM

## 2015-03-10 DIAGNOSIS — I159 Secondary hypertension, unspecified: Secondary | ICD-10-CM

## 2015-03-10 DIAGNOSIS — N390 Urinary tract infection, site not specified: Secondary | ICD-10-CM

## 2015-03-10 DIAGNOSIS — IMO0002 Reserved for concepts with insufficient information to code with codable children: Secondary | ICD-10-CM

## 2015-03-10 DIAGNOSIS — K2100 Gastro-esophageal reflux disease with esophagitis, without bleeding: Secondary | ICD-10-CM

## 2015-03-10 DIAGNOSIS — I5032 Chronic diastolic (congestive) heart failure: Secondary | ICD-10-CM | POA: Diagnosis not present

## 2015-03-10 DIAGNOSIS — N184 Chronic kidney disease, stage 4 (severe): Secondary | ICD-10-CM | POA: Diagnosis not present

## 2015-03-10 DIAGNOSIS — E034 Atrophy of thyroid (acquired): Secondary | ICD-10-CM

## 2015-03-10 DIAGNOSIS — B9620 Unspecified Escherichia coli [E. coli] as the cause of diseases classified elsewhere: Secondary | ICD-10-CM

## 2015-03-10 DIAGNOSIS — D509 Iron deficiency anemia, unspecified: Secondary | ICD-10-CM

## 2015-03-10 NOTE — Progress Notes (Signed)
Patient ID: Taylor Padilla, female   DOB: 1946/02/20, 69 y.o.   MRN: [id number]     Taylor Padilla   PCP: Taylor JennyRIPP, HENRY, MD  Code Status: full code  Allergies  Allergen Reactions  . Epinephrine Other (See Comments)    Patient says she is not allergic to epi, but that it makes her jittery.  WILL RECEIVE IF NEEDED IN AN EMERGENCY.    Chief Complaint  Patient presents with  . New Admit To SNF     HPI:  69 year old patient is here for short term rehabilitation post hospital admission from 03/02/15-03/04/15 post fall with right acetabular fracture. Orthopedic was consulted and conservative management with therapy and pain control was recommended. She also had UTI and is now on ciprofloxacin. She is seen in her room today. She is in no distress. Her pain is not under control. denies any other concerns. She has medical history of blindness, ckd stage 4, HTN, CAD, CHF, DM.   Review of Systems:  Constitutional: Negative for fever, chills, diaphoresis.  HENT: Negative for headache, congestion, nasal discharge Eyes: Negative for eye pain  Respiratory: Negative for cough, shortness of breath and wheezing.   Cardiovascular: Negative for chest pain, palpitations, leg swelling.  Gastrointestinal: Negative for heartburn, nausea, vomiting, abdominal pain. Had a bowel movement yesterday. Genitourinary: Negative for dysuria, flank pain.  Musculoskeletal: Negative for back pain, falls Skin: Negative for itching, rash.  Neurological: Negative for dizziness, tingling, focal weakness Psychiatric/Behavioral: Negative for depression   Past Medical History  Diagnosis Date  . Hypertension   . CAD (coronary artery disease)   . 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 Macular edema     hx  . Glaucoma, both eyes   . High cholesterol   . CHF (congestive heart failure)   . Chronic kidney disease (CKD), stage II (mild)     [name]/notes 10/07/2014  . Old myocardial infarct     "I was  told I've had one; don't know when" (10/08/2014)  . Pneumonia 1990's X 1  . Hypothyroidism   . Type II diabetes mellitus   . Anemia   . History of blood transfusion 2003    "related to [facility]"  . Stroke     "I've been told I've had one; I don't remember having any reaction from it at all"; denies residual on 10/08/2014   Past Surgical History  Procedure Laterality Date  . Coronary artery bypass graft  01/2002    "CABG X4" [place]  . Cataract extraction w/ intraocular lens  implant, bilateral Bilateral    Social History:   reports that she has never smoked. She has never used smokeless tobacco. She reports that she does not drink alcohol or use illicit drugs.  Family History  Problem Relation Age of Onset  . CAD Neg Hx     Medications: Patient's Medications  New Prescriptions   No medications on file  Previous Medications   AMLODIPINE (NORVASC) 10 MG TABLET    Take 10 mg by mouth daily.   ARIPIPRAZOLE (ABILIFY) 5 MG TABLET    Take 5 mg by mouth daily.   ARTIFICIAL TEAR SOLUTION OP    Place 1 drop into the left eye 4 (four) times daily.   ASPIRIN EC 81 MG EC TABLET    Take 1 tablet (81 mg total) by mouth daily.   ATORVASTATIN (LIPITOR) 80 MG TABLET    Take 80 mg by mouth at bedtime.  CEPHALEXIN (KEFLEX) 250 MG CAPSULE    Take 1 capsule (250 mg total) by mouth 2 (two) times daily.   CLOPIDOGREL (PLAVIX) 75 MG TABLET    Take 75 mg by mouth daily.   DORZOLAMIDE-TIMOLOL (COSOPT) 22.3-6.8 MG/ML OPHTHALMIC SOLUTION    Place 1 drop into both eyes every 12 (twelve) hours.   DOXAZOSIN (CARDURA) 4 MG TABLET    Take 1 tablet (4 mg total) by mouth daily.   FERROUS SULFATE 325 (65 FE) MG TABLET    Take 1 tablet (325 mg total) by mouth 3 (three) times daily with meals.   FUROSEMIDE (LASIX) 40 MG TABLET    Take 40-80 mg by mouth 2 (two) times daily. 2 tab in the morning and 1 in the evening   HYDRALAZINE (APRESOLINE) 100 MG TABLET    Take 1 tablet (100 mg total) by mouth every 8 (eight) hours.     ISOSORBIDE DINITRATE (ISORDIL) 10 MG TABLET    Take 2 tablets (20 mg total) by mouth 3 (three) times daily.   LANSOPRAZOLE (PREVACID) 30 MG CAPSULE    Take 30 mg by mouth daily.   LEVOTHYROXINE (SYNTHROID, LEVOTHROID) 100 MCG TABLET    Take 1 tablet (100 mcg total) by mouth daily before breakfast.   MELATONIN 3 MG TABS    Take 3 mg by mouth at bedtime.   METHOCARBAMOL (ROBAXIN) 500 MG TABLET    Take 1 tablet (500 mg total) by mouth every 6 (six) hours as needed for muscle spasms.   OXYCODONE-ACETAMINOPHEN (PERCOCET/ROXICET) 5-325 MG PER TABLET    Take 1 tablet by mouth every 4 (four) hours as needed for severe pain.   PREDNISOLONE ACETATE (PRED FORTE) 1 % OPHTHALMIC SUSPENSION    Place 1 drop into the left eye 4 (four) times daily.   RANITIDINE (ZANTAC) 150 MG TABLET    Take 150 mg by mouth 2 (two) times daily as needed for heartburn. indigestion   SERTRALINE (ZOLOFT) 100 MG TABLET    Take 200 mg by mouth at bedtime.   TRAMADOL (ULTRAM) 50 MG TABLET    Take 1 tablet (50 mg total) by mouth every 12 (twelve) hours as needed for moderate pain.  Modified Medications   No medications on file  Discontinued Medications   No medications on file     Physical Exam: Filed Vitals:   03/10/15 1408  BP: 153/65  Pulse: 60  Temp: 98.4 F (36.9 C)  Resp: 16  Height: 5' (1.524 m)  Weight: 138 lb 9.6 oz (62.869 kg)  SpO2: 95%    General- elderly female, in no acute distress Head- normocephalic, atraumatic Throat- moist mucus membrane Eyes- no pallor, no icterus, no discharge Neck- no cervical lymphadenopathy Cardiovascular- normal s1,s2, no murmurs, palpabledorsalis pedis and radial pulses, trace right leg edema Respiratory- bilateral clear to auscultation, no wheeze, no rhonchi, no crackles, no use of accessory muscles Abdomen- bowel sounds present, soft, non tender Musculoskeletal- unable to move her right leg due to pain, able to move all other 3 extremities Neurological- no focal deficit,  alert and oriented Skin- warm and dry Psychiatry- normal mood and affect    Labs reviewed: Basic Metabolic Panel:  Recent Labs  16/10/96 0048  03/02/15 1536 03/03/15 0505 03/04/15 0535  NA 136  < > 133* 137 138  K 3.6  < > 2.8* 2.9* 3.7  CL 105  < > 93* 100* 101  CO2 24  < > GLUCOSE 242*  < > 247*  200* 170*  BUN 35*  < > 38* 35* 30*  CREATININE 1.90*  < > 2.10* 2.01* 1.83*  CALCIUM 8.1*  < > 8.9 8.5* 9.2  MG 2.3  --  2.0  --   --   < > = values in this interval not displayed. Liver Function Tests:  Recent Labs  11/01/14 0048  AST 17  ALT 15  ALKPHOS 67  BILITOT 0.6  PROT 5.9*  ALBUMIN 2.8*   No results for input(s): LIPASE, AMYLASE in the last 8760 hours. No results for input(s): AMMONIA in the last 8760 hours. CBC:  Recent Labs  10/31/14 1645  11/19/14 1215  03/02/15 1536 03/03/15 0505 03/04/15 0535  WBC 7.3  < > 6.8  < > 8.3 9.2 7.7  NEUTROABS 5.7  --  5.2  --  7.0  --   --   HGB 7.8*  < > 7.6*  < > 11.2* 10.3* 10.3*  HCT 24.9*  < > 24.7*  < > 34.6* 31.5* 31.5*  MCV 90.5  < > 91.1  < > 90.8 90.8 90.8  PLT 243  < > 304  < > 302 283 285  < > = values in this interval not displayed. Cardiac Enzymes:  Recent Labs  10/07/14 2000 10/08/14 0116 10/08/14 0744  TROPONINI <0.03 <0.03 <0.03   BNP: Invalid input(s): POCBNP CBG:  Recent Labs  03/03/15 2155 03/04/15 0822 03/04/15 1203  GLUCAP 138* 267* 137*   Lab Results  Component Value Date   HGBA1C 8.9* 03/04/2015    Radiological Exams:  Ct Pelvis Wo Contrast  03/02/2015   CLINICAL DATA:  Right leg pain extending from the groin to the knee. Fall 1 week ago.  EXAM: CT PELVIS WITHOUT CONTRAST  TECHNIQUE: Multidetector CT imaging of the pelvis was performed following the standard protocol without intravenous contrast.  COMPARISON:  None.  FINDINGS: Visit acute nondisplaced fracture the right inferior pubic ramus. Suspected linear nondisplaced fracture of the right acetabulum involving  the quadrilateral plate and extending cephalad, compatible with an anterior column fracture.  Degenerative SI joint arthropathy noted, right greater than left.  Grade 1 degenerative anterolisthesis at L5-S1.  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  Retroverted uterus.  Scattered sigmoid colon diverticula.  IMPRESSION: 1. Right acetabular anterior column fracture, with involvement of the right inferior pubic ramus. The fracture is relatively nondisplaced. 2. Degenerative SI joint arthropathy noted, right greater than left. 3. Grade 1 degenerative anterolisthesis at L5-S1.   Electronically Signed   By: Gaylyn Rong M.D.   On: 03/02/2015 14:27   Dg Knee Complete 4 Views Right  03/02/2015   CLINICAL DATA:  Pain following fall 1 week prior  EXAM: RIGHT KNEE - COMPLETE 4+ VIEW  COMPARISON:  None.  FINDINGS: Frontal, lateral, and bilateral oblique views were obtained. There is no demonstrable fracture or dislocation. No joint effusion. There is moderate narrowing medially. There is mild spurring along the posterior aspect of the patellofemoral joint as well as spurring medially and laterally. There are scattered foci of arterial vascular calcification. No erosive change.  IMPRESSION: Osteoarthritic change, most notably along the medial aspect. No fracture or effusion. Areas of arterial vascular calcification present.   Electronically Signed   By: Bretta Bang III M.D.   On: 03/02/2015 14:28    Assessment/Plan  Right acetabular fracture  No surgery performed, under conservative management. NWB to right lower extremity. Has follow up with orthopedics Dr. Magnus Ivan. currently on Percocet 5/325 mg 1  tab every 4 hours prn with tramadol 50 mg bid prn pain. Will increase her tramadol to 50 mg q6h prn for pain and reassess. Continue Robaxin 500 mg every 6 hours when necessary for muscle spasm. Will have patient work with PT/OT as tolerated to regain strength and restore function.  Fall precautions  are in place.  E.coli UTI Culture sensitivity report showed resistance to ciprofloxacin. D/c cipro and start course of Keflex 250 mg tid for 1 week with florastor 250 mg bid for 2 weeks. Hydration encouraged  HTN Elevated bp on review for today. Currently on doxazosin 4 mg  daily, hydralazine 100 mg tid, amlodipine 10 mg daily. Monitor bp q shift and if > 3 reading > 140/90, will need to change hydralazine to qid  Chronic diastolic CHF Monitor daily weight. continue Lasix 80 mg in a.m. and 40 mg in pm, hydralazine 100 mg tid and isosorbide dinitrate 20 mg tid. Check bmp. Not on b blocker with bradycardia  Chronic kidney disease stage IV Last creatinine 1.83. Monitor renal function  CAD Remains chest pain free. continue isosorbide dinitrate 20 mg tid with aspirin 81 mg daily and Plavix 75 mg daily. Monitor clinically  Iron deficiency anemia Hb 10.3, monitor h7h, continue ferrous sulfate 325 mg bid  Diabetes mellitus, type II hemoglobin A1c 8.9, monitor cbg bid for now, start glipizide IR 5 mg daily for now and reassess  Hyperlipidemia  continue atorvastatin 80 mg daily  Hypothyroidism TSH 2.746. Continue Synthroid 100 mcg daily  Chronic depression Stable mood. Continue Abilify 5 mg daily and Zoloft 200 mg daily  GERD On prevacid 30 mg daily with zantaqc 150 mg bid prn, monitor clinically, has gi follow up outpatient for possible gi bleed. Monitor h&h   Goals of care: short term rehabilitation   Labs/tests ordered: cbc, cmp  Family/ staff Communication: reviewed care plan with patient and nursing supervisor    Oneal Grout, MD  [facility] [place] [facility] [phone] (Monday-Friday 8 am - 5 pm) [phone] (afterhours)

## 2015-03-24 ENCOUNTER — Encounter: Payer: Self-pay | Admitting: Adult Health

## 2015-03-24 ENCOUNTER — Non-Acute Institutional Stay: Payer: Medicare PPO | Admitting: Adult Health

## 2015-03-24 DIAGNOSIS — I5032 Chronic diastolic (congestive) heart failure: Secondary | ICD-10-CM

## 2015-03-24 DIAGNOSIS — S32401S Unspecified fracture of right acetabulum, sequela: Secondary | ICD-10-CM | POA: Diagnosis not present

## 2015-03-24 DIAGNOSIS — I2510 Atherosclerotic heart disease of native coronary artery without angina pectoris: Secondary | ICD-10-CM | POA: Diagnosis not present

## 2015-03-24 DIAGNOSIS — N184 Chronic kidney disease, stage 4 (severe): Secondary | ICD-10-CM | POA: Diagnosis not present

## 2015-03-24 NOTE — Progress Notes (Signed)
Patient ID: Taylor Padilla, female   DOB: 1946-01-27, 69 y.o.   MRN: [id number]  Facility: [facility]       Allergies  Allergen Reactions  . Epinephrine Other (See Comments)    Patient says she is not allergic to epi, but that it makes her jittery.  WILL RECEIVE IF NEEDED IN AN EMERGENCY.    Chief Complaint  Patient presents with  . Discharge Note    Discharge from SNF    HPI:    Past Medical History  Diagnosis Date  . Hypertension   . CAD (coronary artery disease)   . 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 Macular edema     hx  . Glaucoma, both eyes   . High cholesterol   . CHF (congestive heart failure)   . Chronic kidney disease (CKD), stage II (mild)     [name] 10/07/2014  . Old myocardial infarct     "I was told I've had one; don't know when" (10/08/2014)  . Pneumonia 1990's X 1  . Hypothyroidism   . Type II diabetes mellitus   . Anemia   . History of blood transfusion 2003    "related to OHS"  . Stroke     "I've been told I've had one; I don't remember having any reaction from it at all"; denies residual on 10/08/2014    Past Surgical History  Procedure Laterality Date  . Coronary artery bypass graft  01/2002    "CABG X4" at [place]  . Cataract extraction w/ intraocular lens  implant, bilateral Bilateral     VITAL SIGNS There were no vitals taken for this visit.  Patient's Medications  New Prescriptions   No medications on file  Previous Medications   AMLODIPINE (NORVASC) 10 MG TABLET    Take 10 mg by mouth daily.   ARIPIPRAZOLE (ABILIFY) 5 MG TABLET    Take 5 mg by mouth daily.   ARTIFICIAL TEAR SOLUTION OP    Place 1 drop into the left eye 4 (four) times daily.   ASPIRIN EC 81 MG EC TABLET    Take 1 tablet (81 mg total) by mouth daily.   ATORVASTATIN (LIPITOR) 80 MG TABLET    Take 80 mg by mouth at bedtime.   CLOPIDOGREL (PLAVIX) 75 MG TABLET    Take 75 mg by mouth daily.   DORZOLAMIDE-TIMOLOL (COSOPT) 22.3-6.8 MG/ML OPHTHALMIC  SOLUTION    Place 1 drop into both eyes every 12 (twelve) hours.   DOXAZOSIN (CARDURA) 4 MG TABLET    Take 1 tablet (4 mg total) by mouth daily.   FUROSEMIDE (LASIX) 40 MG TABLET    Take 40-80 mg by mouth 2 (two) times daily. 2 tab in the morning and 1 in the evening   GLIPIZIDE (GLUCOTROL XL) 5 MG 24 HR TABLET    Take 5 mg by mouth daily with breakfast.   HYDRALAZINE (APRESOLINE) 100 MG TABLET    Take 1 tablet (100 mg total) by mouth every 8 (eight) hours.   ISOSORBIDE DINITRATE (ISORDIL) 10 MG TABLET    Take 2 tablets (20 mg total) by mouth 3 (three) times daily.   LEVOTHYROXINE (SYNTHROID, LEVOTHROID) 100 MCG TABLET    Take 1 tablet (100 mcg total) by mouth daily before breakfast.   MELATONIN 3 MG TABS    Take 3 mg by mouth at bedtime.   METHOCARBAMOL (ROBAXIN) 500 MG TABLET    Take 1 tablet (500 mg total) by mouth every 6 (  six) hours as needed for muscle spasms.   OMEPRAZOLE (PRILOSEC) 20 MG CAPSULE    Take 20 mg by mouth daily.   OXYCODONE-ACETAMINOPHEN (PERCOCET/ROXICET) 5-325 MG PER TABLET    Take 1 tablet by mouth every 4 (four) hours as needed for severe pain.   POTASSIUM CHLORIDE SA (K-DUR,KLOR-CON) 20 MEQ TABLET    Take 20 mEq by mouth daily.   PREDNISOLONE ACETATE (PRED FORTE) 1 % OPHTHALMIC SUSPENSION    Place 1 drop into the left eye 4 (four) times daily.   RANITIDINE (ZANTAC) 150 MG TABLET    Take 150 mg by mouth 2 (two) times daily as needed for heartburn. indigestion   SERTRALINE (ZOLOFT) 100 MG TABLET    Take 200 mg by mouth at bedtime.  Modified Medications   Modified Medication Previous Medication   FERROUS SULFATE 325 (65 FE) MG TABLET ferrous sulfate 325 (65 FE) MG tablet      Take 1 tablet (325 mg total) by mouth 2 (two) times daily with a meal.    Take 1 tablet (325 mg total) by mouth 3 (three) times daily with meals.   TRAMADOL (ULTRAM) 50 MG TABLET traMADol (ULTRAM) 50 MG tablet      Take 1 tablet (50 mg total) by mouth every 6 (six) hours as needed for moderate pain.     Take 1 tablet (50 mg total) by mouth every 12 (twelve) hours as needed for moderate pain.  Discontinued Medications   LANSOPRAZOLE (PREVACID) 30 MG CAPSULE    Take 30 mg by mouth daily.     SIGNIFICANT DIAGNOSTIC EXAMS    Review of Systems    Physical Exam     ASSESSMENT/ PLAN:    Synthia InnocentDeborah Green NP [facility]  Contact [phone] Monday through Friday 8am- 5pm  After hours call [phone]

## 2015-03-25 NOTE — Progress Notes (Signed)
Patient ID: Taylor Padilla, female   DOB: [birth year], 69 y.o.   MRN: [id number]    Facility: [facility]       Allergies  Allergen Reactions  . Epinephrine Other (See Comments)    Patient says she is not allergic to epi, but that it makes her jittery.  WILL RECEIVE IF NEEDED IN AN EMERGENCY.    Chief Complaint  Patient presents with  . Discharge Note    Discharge from SNF    HPI:  She had been hospitalized for a right acetabular fracture which was treated conservatively. She was admitted to this facility for short term rehab and is ready now for discharge She is being discharged to assisted living facility [place]. She will not need dme; will not need home health. She will need prescriptions to be written for her narcotics. The medical provider will follow up with her.   Past Medical History  Diagnosis Date  . Hypertension   . CAD (coronary artery disease)   . 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 Macular edema     hx  . Glaucoma, both eyes   . High cholesterol   . CHF (congestive heart failure)   . Chronic kidney disease (CKD), stage II (mild)     Taylor Padilla 10/07/2014  . Old myocardial infarct     "I was told I've had one; don't know when" (10/08/2014)  . Pneumonia 1990's X 1  . Hypothyroidism   . Type II diabetes mellitus   . Anemia   . History of blood transfusion 2003    "related to OHS"  . Stroke     "I've been told I've had one; I don't remember having any reaction from it at all"; denies residual on 10/08/2014    Past Surgical History  Procedure Laterality Date  . 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 Cataract extraction w/ intraocular lens  implant, bilateral Bilateral     VITAL SIGNS BP 125/51 mmHg  Pulse 57  Temp(Src) 99.2 F (37.3 C) (Oral)  Resp 19  Ht 5' (1.524 m)  Wt 137 lb (62.143 kg)  BMI 26.76 kg/m2  SpO2 97%  Patient's Medications  New Prescriptions   No medications on file  Previous Medications   AMLODIPINE (NORVASC) 10 MG TABLET    Take 10 mg by mouth daily.   ARIPIPRAZOLE (ABILIFY) 5 MG TABLET    Take 5 mg by mouth daily.   ARTIFICIAL TEAR SOLUTION OP    Place 1 drop into the left eye 4 (four) times daily.   ASPIRIN EC 81 MG EC TABLET    Take 1 tablet (81 mg total) by mouth daily.   ATORVASTATIN (LIPITOR) 80 MG TABLET    Take 80 mg by mouth at bedtime.   CLOPIDOGREL (PLAVIX) 75 MG TABLET    Take 75 mg by mouth daily.   DORZOLAMIDE-TIMOLOL (COSOPT) 22.3-6.8 MG/ML OPHTHALMIC SOLUTION    Place 1 drop into both eyes every 12 (twelve) hours.   DOXAZOSIN (CARDURA) 4 MG TABLET    Take 1 tablet (4 mg total) by mouth daily.   FUROSEMIDE (LASIX) 40 MG TABLET    Take 40-80 mg by mouth 2 (two) times daily. 2 tab in the morning and 1 in the evening   GLIPIZIDE (GLUCOTROL XL) 5 MG 24 HR TABLET    Take 5 mg by mouth daily with breakfast.   HYDRALAZINE (APRESOLINE) 100 MG TABLET    Take 1 tablet (  100 mg total) by mouth every 8 (eight) hours.   ISOSORBIDE DINITRATE (ISORDIL) 10 MG TABLET    Take 2 tablets (20 mg total) by mouth 3 (three) times daily.   LEVOTHYROXINE (SYNTHROID, LEVOTHROID) 100 MCG TABLET    Take 1 tablet (100 mcg total) by mouth daily before breakfast.   MELATONIN 3 MG TABS    Take 3 mg by mouth at bedtime.   METHOCARBAMOL (ROBAXIN) 500 MG TABLET    Take 1 tablet (500 mg total) by mouth every 6 (six) hours as needed for muscle spasms.   OMEPRAZOLE (PRILOSEC) 20 MG CAPSULE    Take 20 mg by mouth daily.   OXYCODONE-ACETAMINOPHEN (PERCOCET/ROXICET) 5-325 MG PER TABLET    Take 1 tablet by mouth every 4 (four) hours as needed for severe pain.   POTASSIUM CHLORIDE SA (K-DUR,KLOR-CON) 20 MEQ TABLET    Take 20 mEq by mouth daily.   PREDNISOLONE ACETATE (PRED FORTE) 1 % OPHTHALMIC SUSPENSION    Place 1 drop into the left eye 4 (four) times daily.   RANITIDINE (ZANTAC) 150 MG TABLET    Take 150 mg by mouth 2 (two) times daily as needed for heartburn. indigestion   SERTRALINE (ZOLOFT) 100 MG  TABLET    Take 200 mg by mouth at bedtime.  Modified Medications   Modified Medication Previous Medication   FERROUS SULFATE 325 (65 FE) MG TABLET ferrous sulfate 325 (65 FE) MG tablet      Take 1 tablet (325 mg total) by mouth 2 (two) times daily with a meal.    Take 1 tablet (325 mg total) by mouth 3 (three) times daily with meals.   TRAMADOL (ULTRAM) 50 MG TABLET traMADol (ULTRAM) 50 MG tablet      Take 1 tablet (50 mg total) by mouth every 6 (six) hours as needed for moderate pain.    Take 1 tablet (50 mg total) by mouth every 12 (twelve) hours as needed for moderate pain.  Discontinued Medications   LANSOPRAZOLE (PREVACID) 30 MG CAPSULE    Take 30 mg by mouth daily.     SIGNIFICANT DIAGNOSTIC EXAMS   LABS REVIEWED:   03-09-15: wbc 8.0; hgb hct 30.9; mcv 86.1; plt 285; glucose 191; bun 43; creat 1.96; k+3.4; na++134; liver normal albumin 3.3    Review of Systems  Constitutional: Negative for appetite change and fatigue.  HENT: Negative for congestion.   Respiratory: Negative for cough, chest tightness and shortness of breath.   Cardiovascular: Negative for chest pain, palpitations and leg swelling.  Gastrointestinal: Negative for nausea, abdominal pain, diarrhea and constipation.  Musculoskeletal: Negative for myalgias and arthralgias.  Skin: Negative for pallor.  Neurological: Negative for dizziness.  Psychiatric/Behavioral: The patient is not nervous/anxious.       Physical Exam  Constitutional: No distress.  Eyes: Conjunctivae are normal.  Neck: Neck supple. No JVD present. No thyromegaly present.  Cardiovascular: Normal rate, regular rhythm and intact distal pulses.   Respiratory: Effort normal and breath sounds normal. No respiratory distress. She has no wheezes.  GI: Soft. Bowel sounds are normal. She exhibits no distension. There is no tenderness.  Musculoskeletal: She exhibits no edema.  Able to move all extremities   Lymphadenopathy:    She has no cervical  adenopathy.  Neurological: She is alert.  Skin: Skin is warm and dry. She is not diaphoretic.  Psychiatric: She has a normal mood and affect.     ASSESSMENT/ PLAN:  Will discharge her to [facility] assisted living. She  will not need home health and will not need dme. Her prescriptions have been written #30 percocet 5/325 mg tabs; #30 ultram 50 mg tabs. The medical provider at the facility will follow up.  Time spent with patient 25   minutes >50% time spent counseling; reviewing medical record; tests; labs; and developing future plan of care    Synthia Innocent NP [facility]  Contact [phone] Monday through Friday 8am- 5pm  After hours call [phone]

## 2015-03-31 ENCOUNTER — Ambulatory Visit: Payer: Self-pay | Admitting: Gastroenterology

## 2015-04-26 ENCOUNTER — Emergency Department (HOSPITAL_COMMUNITY)
Admission: EM | Admit: 2015-04-26 | Discharge: 2015-04-26 | Disposition: A | Discharge status: Home / Self Care | Payer: Medicare PPO | Attending: Physician Assistant | Admitting: Physician Assistant

## 2015-04-26 ENCOUNTER — Emergency Department (HOSPITAL_COMMUNITY): Payer: Medicare PPO

## 2015-04-26 DIAGNOSIS — D649 Anemia, unspecified: Secondary | ICD-10-CM | POA: Diagnosis not present

## 2015-04-26 DIAGNOSIS — Z7982 Long term (current) use of aspirin: Secondary | ICD-10-CM | POA: Insufficient documentation

## 2015-04-26 DIAGNOSIS — H409 Unspecified glaucoma: Secondary | ICD-10-CM | POA: Insufficient documentation

## 2015-04-26 DIAGNOSIS — Z8701 Personal history of pneumonia (recurrent): Secondary | ICD-10-CM | POA: Insufficient documentation

## 2015-04-26 DIAGNOSIS — I509 Heart failure, unspecified: Secondary | ICD-10-CM | POA: Insufficient documentation

## 2015-04-26 DIAGNOSIS — Y9289 Other specified places as the place of occurrence of the external cause: Secondary | ICD-10-CM | POA: Insufficient documentation

## 2015-04-26 DIAGNOSIS — S4992XA Unspecified injury of left shoulder and upper arm, initial encounter: Secondary | ICD-10-CM | POA: Insufficient documentation

## 2015-04-26 DIAGNOSIS — Z8673 Personal history of transient ischemic attack (TIA), and cerebral infarction without residual deficits: Secondary | ICD-10-CM | POA: Diagnosis not present

## 2015-04-26 DIAGNOSIS — E119 Type 2 diabetes mellitus without complications: Secondary | ICD-10-CM | POA: Insufficient documentation

## 2015-04-26 DIAGNOSIS — S29001A Unspecified injury of muscle and tendon of front wall of thorax, initial encounter: Secondary | ICD-10-CM | POA: Diagnosis not present

## 2015-04-26 DIAGNOSIS — Y998 Other external cause status: Secondary | ICD-10-CM | POA: Insufficient documentation

## 2015-04-26 DIAGNOSIS — Z79899 Other long term (current) drug therapy: Secondary | ICD-10-CM | POA: Insufficient documentation

## 2015-04-26 DIAGNOSIS — E780 Pure hypercholesterolemia: Secondary | ICD-10-CM | POA: Insufficient documentation

## 2015-04-26 DIAGNOSIS — I129 Hypertensive chronic kidney disease with stage 1 through stage 4 chronic kidney disease, or unspecified chronic kidney disease: Secondary | ICD-10-CM | POA: Insufficient documentation

## 2015-04-26 DIAGNOSIS — W19XXXA Unspecified fall, initial encounter: Secondary | ICD-10-CM

## 2015-04-26 DIAGNOSIS — I2510 Atherosclerotic heart disease of native coronary artery without angina pectoris: Secondary | ICD-10-CM | POA: Insufficient documentation

## 2015-04-26 DIAGNOSIS — N182 Chronic kidney disease, stage 2 (mild): Secondary | ICD-10-CM | POA: Diagnosis not present

## 2015-04-26 DIAGNOSIS — H540 Blindness, both eyes: Secondary | ICD-10-CM | POA: Insufficient documentation

## 2015-04-26 DIAGNOSIS — Z7902 Long term (current) use of antithrombotics/antiplatelets: Secondary | ICD-10-CM | POA: Diagnosis not present

## 2015-04-26 DIAGNOSIS — I252 Old myocardial infarction: Secondary | ICD-10-CM | POA: Insufficient documentation

## 2015-04-26 DIAGNOSIS — E039 Hypothyroidism, unspecified: Secondary | ICD-10-CM | POA: Diagnosis not present

## 2015-04-26 DIAGNOSIS — Y9389 Activity, other specified: Secondary | ICD-10-CM | POA: Diagnosis not present

## 2015-04-26 DIAGNOSIS — W1839XA Other fall on same level, initial encounter: Secondary | ICD-10-CM | POA: Diagnosis not present

## 2015-04-26 MED ORDER — ACETAMINOPHEN 325 MG PO TABS
650.0000 mg | ORAL_TABLET | Freq: Once | ORAL | Status: AC
  Administered 2015-04-26: 650 mg via ORAL

## 2015-04-26 NOTE — Discharge Instructions (Signed)
You were seen today after fall. We did not see any fractures or breaks. Please follow-up with your regular physician. Sometimes you can bruise a  Rib, s Fall Prevention and Home Safety Falls cause injuries and can affect all age groups. It is possible to use preventive measures to significantly decrease the likelihood of falls. There are many simple measures which can make your home safer and prevent falls. OUTDOORS  Repair cracks and edges of walkways and driveways.  Remove high doorway thresholds.  Trim shrubbery on the main path into your home.  Have good outside lighting.  Clear walkways of tools, rocks, debris, and clutter.  Check that handrails are not broken and are securely fastened. Both sides of steps should have handrails.  Have leaves, snow, and ice cleared regularly.  Use sand or salt on walkways during winter months.  In the garage, clean up grease or oil spills. BATHROOM  Install night lights.  Install grab bars by the toilet and in the tub and shower.  Use non-skid mats or decals in the tub or shower.  Place a plastic non-slip stool in the shower to sit on, if needed.  Keep floors dry and clean up all water on the floor immediately.  Remove soap buildup in the tub or shower on a regular basis.  Secure bath mats with non-slip, double-sided rug tape.  Remove throw rugs and tripping hazards from the floors. BEDROOMS  Install night lights.  Make sure a bedside light is easy to reach.  Do not use oversized bedding.  Keep a telephone by your bedside.  Have a firm chair with side arms to use for getting dressed.  Remove throw rugs and tripping hazards from the floor. KITCHEN  Keep handles on pots and pans turned toward the center of the stove. Use back burners when possible.  Clean up spills quickly and allow time for drying.  Avoid walking on wet floors.  Avoid hot utensils and knives.  Position shelves so they are not too high or low.  Place  commonly used objects within easy reach.  If necessary, use a sturdy step stool with a grab bar when reaching.  Keep electrical cables out of the way.  Do not use floor polish or wax that makes floors slippery. If you must use wax, use non-skid floor wax.  Remove throw rugs and tripping hazards from the floor. STAIRWAYS  Never leave objects on stairs.  Place handrails on both sides of stairways and use them. Fix any loose handrails. Make sure handrails on both sides of the stairways are as long as the stairs.  Check carpeting to make sure it is firmly attached along stairs. Make repairs to worn or loose carpet promptly.  Avoid placing throw rugs at the top or bottom of stairways, or properly secure the rug with carpet tape to prevent slippage. Get rid of throw rugs, if possible.  Have an electrician put in a light switch at the top and bottom of the stairs. OTHER FALL PREVENTION TIPS  Wear low-heel or rubber-soled shoes that are supportive and fit well. Wear closed toe shoes.  When using a stepladder, make sure it is fully opened and both spreaders are firmly locked. Do not climb a closed stepladder.  Add color or contrast paint or tape to grab bars and handrails in your home. Place contrasting color strips on first and last steps.  Learn and use mobility aids as needed. Install an electrical emergency response system.  Turn on lights to  avoid dark areas. Replace light bulbs that burn out immediately. Get light switches that glow.  Arrange furniture to create clear pathways. Keep furniture in the same place.  Firmly attach carpet with non-skid or double-sided tape.  Eliminate uneven floor surfaces.  Select a carpet pattern that does not visually hide the edge of steps.  Be aware of all pets. OTHER HOME SAFETY TIPS  Set the water temperature for 120 F (48.8 C).  Keep emergency numbers on or near the telephone.  Keep smoke detectors on every level of the home and near  sleeping areas. Document Released: 08/11/2002 Document Revised: 02/20/2012 Document Reviewed: 11/10/2011 [facility] Patient Information 2015 [place], [place]. This information is not intended to replace advice given to you by your health care provider. Make sure you discuss any questions you have with your health care provider. o make sure you continue to take deep breaths at home.

## 2015-04-26 NOTE — ED Notes (Signed)
Bed: [facility] Expected date:  Expected time:  Means of arrival:  Comments: EMS- 69s, fall x 2 days ago

## 2015-04-26 NOTE — ED Notes (Signed)
ptar informed of pt. Report called to [place].

## 2015-04-26 NOTE — ED Notes (Signed)
Pt alert x4 respirations easy non labored.  

## 2015-04-26 NOTE — ED Notes (Signed)
[facility] s/p fall 2 days ago c/o pain in left brest/ chest reproducible with inspiration and movement.

## 2015-04-26 NOTE — ED Provider Notes (Signed)
CSN: [id number]     Arrival date & time 04/26/15  1328 History   First MD Initiated Contact with Patient 04/26/15 1522     Chief Complaint  Patient presents with  . Shoulder Pain  . Chest Pain     (Consider location/radiation/quality/duration/timing/severity/associated sxs/prior Treatment) HPI  Patient is a very pleasant 69 year old female with bilateral blindness presenting today after fall at her assisted living. She fell onto her left shoulder. She's had pain for the last 3 days if she decided to come get checked out. She has full range of motion did not hit her head no loss consciousness no other complaints.   Past Medical History  Diagnosis Date  . Hypertension   . CAD (coronary artery disease)   . 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 Macular edema     hx  . Glaucoma, both eyes   . High cholesterol   . CHF (congestive heart failure)   . Chronic kidney disease (CKD), stage II (mild)     [name] 10/07/2014  . Old myocardial infarct     "I was told I've had one; don't know when" (10/08/2014)  . Pneumonia 1990's X 1  . Hypothyroidism   . Type II diabetes mellitus   . Anemia   . History of blood transfusion 2003    "related to OHS"  . Stroke     "I've been told I've had one; I don't remember having any reaction from it at all"; denies residual on 10/08/2014   Past Surgical History  Procedure Laterality Date  . 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 Cataract extraction w/ intraocular lens  implant, bilateral Bilateral    Family History  Problem Relation Age of Onset  . CAD Neg Hx    Social History  Substance Use Topics  . Smoking status: Never Smoker   . Smokeless tobacco: Never Used  . Alcohol Use: No   OB History    No data available     Review of Systems  Constitutional: Negative for activity change.  Respiratory: Negative for shortness of breath.   Cardiovascular: Negative for chest pain.  Gastrointestinal: Negative for abdominal  pain.  Neurological: Negative for syncope and light-headedness.  Psychiatric/Behavioral: Negative for behavioral problems and agitation.      Allergies  Epinephrine  Home Medications   Prior to Admission medications   Medication Sig Start Date End Date Taking? Authorizing Provider  amLODipine (NORVASC) 10 MG tablet Take 10 mg by mouth daily. 09/27/14  Yes Historical Provider, MD  ARIPiprazole (ABILIFY) 5 MG tablet Take 5 mg by mouth daily.   Yes Historical Provider, MD  ARTIFICIAL TEAR SOLUTION OP Place 1 drop into the left eye 4 (four) times daily.   Yes Historical Provider, MD  aspirin EC 81 MG EC tablet Take 1 tablet (81 mg total) by mouth daily. 11/26/14  Yes Renae Fickle, MD  atorvastatin (LIPITOR) 80 MG tablet Take 80 mg by mouth at bedtime. 09/21/14  Yes Historical Provider, MD  clopidogrel (PLAVIX) 75 MG tablet Take 75 mg by mouth daily. 09/24/14  Yes Historical Provider, MD  dorzolamide-timolol (COSOPT) 22.3-6.8 MG/ML ophthalmic solution Place 1 drop into both eyes every 12 (twelve) hours. 10/06/14  Yes Historical Provider, MD  ferrous sulfate 325 (65 FE) MG tablet Take 1 tablet (325 mg total) by mouth 2 (two) times daily with a meal. 03/24/15  Yes Sharee Holster, NP  furosemide (LASIX) 40 MG tablet Take  40-80 mg by mouth 2 (two) times daily. 2 tab in the morning and 1 in the evening   Yes Historical Provider, MD  hydrALAZINE (APRESOLINE) 100 MG tablet Take 1 tablet (100 mg total) by mouth every 8 (eight) hours. 11/03/14  Yes Joseph Art, DO  isosorbide dinitrate (ISORDIL) 10 MG tablet Take 2 tablets (20 mg total) by mouth 3 (three) times daily. 10/10/14  Yes Jerald Kief, MD  levothyroxine (SYNTHROID, LEVOTHROID) 100 MCG tablet Take 1 tablet (100 mcg total) by mouth daily before breakfast. 11/26/14  Yes Renae Fickle, MD  Melatonin 3 MG TABS Take 3 mg by mouth at bedtime.   Yes Historical Provider, MD  omeprazole (PRILOSEC) 20 MG capsule Take 20 mg by mouth daily.   Yes Historical  Provider, MD  oxyCODONE-acetaminophen (PERCOCET/ROXICET) 5-325 MG per tablet Take 1 tablet by mouth every 4 (four) hours as needed for severe pain. 03/04/15  Yes Renae Fickle, MD  prednisoLONE acetate (PRED FORTE) 1 % ophthalmic suspension Place 1 drop into the left eye 4 (four) times daily. 09/27/14  Yes Historical Provider, MD  sertraline (ZOLOFT) 100 MG tablet Take 200 mg by mouth at bedtime.   Yes Historical Provider, MD  traMADol (ULTRAM) 50 MG tablet Take 1 tablet (50 mg total) by mouth every 6 (six) hours as needed for moderate pain. 03/24/15  Yes Sharee Holster, NP  doxazosin (CARDURA) 4 MG tablet Take 1 tablet (4 mg total) by mouth daily. Patient not taking: Reported on 04/26/2015 11/26/14   Renae Fickle, MD  methocarbamol (ROBAXIN) 500 MG tablet Take 1 tablet (500 mg total) by mouth every 6 (six) hours as needed for muscle spasms. Patient not taking: Reported on 04/26/2015 03/04/15   Renae Fickle, MD   BP 186/66 mmHg  Pulse 62  Temp(Src) 98.2 F (36.8 C) (Oral)  Resp 18  SpO2 96% Physical Exam  Constitutional: She is oriented to person, place, and time. She appears well-developed and well-nourished.  HENT:  Head: Normocephalic and atraumatic.  Eyes: Right eye exhibits no discharge.  Blind bilaterally  Neck: Neck supple.  Cardiovascular: Normal rate, regular rhythm and normal heart sounds.   No murmur heard. Pulmonary/Chest: Effort normal and breath sounds normal. She has no wheezes. She has no rales.  Abdominal: Soft. She exhibits no distension. There is no tenderness.  Musculoskeletal: Normal range of motion. She exhibits no edema.  Tenderness to her left humerus/shoulder. Tenderness anterior chest wall on the left.  Pulses intact no abrasions noted. No pain to elbow or wrist.  Neurological: She is oriented to person, place, and time. No cranial nerve deficit.  Skin: Skin is warm and dry. No rash noted. She is not diaphoretic.  Psychiatric: She has a normal mood and  affect. Her behavior is normal.  Nursing note and vitals reviewed.   ED Course  Procedures (including critical care time) Labs Review Labs Reviewed - No data to display  Imaging Review Dg Chest 2 View  04/26/2015   CLINICAL DATA:  Left-sided chest pain, fall 3 days ago  EXAM: CHEST  2 VIEW  COMPARISON:  11/22/2014  FINDINGS: Evidence of CABG noted. Heart size upper limits of normal. Left lower lobe curvilinear scarring is present. No focal pulmonary parenchymal consolidation, nodule, or mass. No pleural effusion. No acute osseous abnormality.  IMPRESSION: No acute cardiopulmonary process. Consider dedicated rib radiographs if there is high clinical suspicion for fracture.   Electronically Signed   By: Christiana Pellant M.D.   On: 04/26/2015  16:22   Dg Shoulder Left  04/26/2015   CLINICAL DATA:  Larey Seat onto her face and LEFT side 3 days ago, no relief from LEFT shoulder pain and LEFT humeral pain  EXAM: LEFT SHOULDER - 2+ VIEW  COMPARISON:  None  FINDINGS: Osseous demineralization.  AC joint alignment normal.  No definite fracture, glenohumeral dislocation, or bone destruction.  Visualized LEFT ribs appear intact.  IMPRESSION: No acute osseous abnormalities.   Electronically Signed   By: Ulyses Southward M.D.   On: 04/26/2015 16:17   Dg Humerus Left  04/26/2015   CLINICAL DATA:  Fall onto for scratch head fall on left side 3 days ago. Left shoulder and humerus pain.  EXAM: LEFT HUMERUS - 2+ VIEW  COMPARISON:  None.  FINDINGS: No acute bony abnormality. Specifically, no fracture, subluxation, or dislocation. Soft tissues are intact.  IMPRESSION: Negative.   Electronically Signed   By: Charlett Nose M.D.   On: 04/26/2015 16:17   I have personally reviewed and evaluated these images and lab results as part of my medical decision-making.   EKG Interpretation   Date/Time:  Monday April 26 2015 16:56:16 EDT Ventricular Rate:  60 PR Interval:  154 QRS Duration: 114 QT Interval:  463 QTC Calculation:  463 R Axis:   126 Text Interpretation:  Sinus rhythm Borderline intraventricular conduction  delay Abnrm T, consider ischemia, anterolateral lds Baseline wander in  lead(s) V5 no acute ischemia No significant change since last tracing  Confirmed by Kandis Mannan ([id number]) on 04/26/2015 5:38:12 PM      MDM   Final diagnoses:  Fall, initial encounter   patient is a 69 year old female presenting today with mechanical fall onto her left shoulder. She's had 3 days of pain in that area and anterior chest wall. She is otherwise at her baseline for the last 2 days. We will get x-rays of the shoulder, humerus and anterior chest wall. Range of motion is normal. Anticipate negative x-rays and ability to discharge home.    Naleigha Raimondi Randall An, MD 04/26/15 [phone]

## 2015-04-26 NOTE — ED Notes (Signed)
Patient arrived from [facility] via EMS.  She had a fall 2 days ago and hurt her left shoulder.  EMS said no one from facility told him if the phone was witnessed.  Patient doesn't complain of n/v but chest pain on the left side.   CBG: 423

## 2015-05-30 ENCOUNTER — Emergency Department (HOSPITAL_COMMUNITY)
Admission: EM | Admit: 2015-05-30 | Discharge: 2015-05-31 | Disposition: A | Discharge status: Home / Self Care | Payer: Medicare PPO | Attending: Emergency Medicine | Admitting: Emergency Medicine

## 2015-05-30 DIAGNOSIS — D649 Anemia, unspecified: Secondary | ICD-10-CM | POA: Diagnosis not present

## 2015-05-30 DIAGNOSIS — Z79899 Other long term (current) drug therapy: Secondary | ICD-10-CM | POA: Insufficient documentation

## 2015-05-30 DIAGNOSIS — N182 Chronic kidney disease, stage 2 (mild): Secondary | ICD-10-CM | POA: Insufficient documentation

## 2015-05-30 DIAGNOSIS — E780 Pure hypercholesterolemia: Secondary | ICD-10-CM | POA: Diagnosis not present

## 2015-05-30 DIAGNOSIS — Z7902 Long term (current) use of antithrombotics/antiplatelets: Secondary | ICD-10-CM | POA: Diagnosis not present

## 2015-05-30 DIAGNOSIS — I2510 Atherosclerotic heart disease of native coronary artery without angina pectoris: Secondary | ICD-10-CM | POA: Insufficient documentation

## 2015-05-30 DIAGNOSIS — H540 Blindness, both eyes: Secondary | ICD-10-CM | POA: Diagnosis not present

## 2015-05-30 DIAGNOSIS — Z8701 Personal history of pneumonia (recurrent): Secondary | ICD-10-CM | POA: Diagnosis not present

## 2015-05-30 DIAGNOSIS — I129 Hypertensive chronic kidney disease with stage 1 through stage 4 chronic kidney disease, or unspecified chronic kidney disease: Secondary | ICD-10-CM | POA: Diagnosis not present

## 2015-05-30 DIAGNOSIS — N390 Urinary tract infection, site not specified: Secondary | ICD-10-CM | POA: Insufficient documentation

## 2015-05-30 DIAGNOSIS — E1165 Type 2 diabetes mellitus with hyperglycemia: Secondary | ICD-10-CM | POA: Insufficient documentation

## 2015-05-30 DIAGNOSIS — Z7952 Long term (current) use of systemic steroids: Secondary | ICD-10-CM | POA: Insufficient documentation

## 2015-05-30 DIAGNOSIS — E039 Hypothyroidism, unspecified: Secondary | ICD-10-CM | POA: Diagnosis not present

## 2015-05-30 DIAGNOSIS — I252 Old myocardial infarction: Secondary | ICD-10-CM | POA: Diagnosis not present

## 2015-05-30 DIAGNOSIS — H409 Unspecified glaucoma: Secondary | ICD-10-CM | POA: Insufficient documentation

## 2015-05-30 DIAGNOSIS — Z951 Presence of aortocoronary bypass graft: Secondary | ICD-10-CM | POA: Diagnosis not present

## 2015-05-30 DIAGNOSIS — Z8673 Personal history of transient ischemic attack (TIA), and cerebral infarction without residual deficits: Secondary | ICD-10-CM | POA: Insufficient documentation

## 2015-05-30 DIAGNOSIS — Z7982 Long term (current) use of aspirin: Secondary | ICD-10-CM | POA: Insufficient documentation

## 2015-05-30 DIAGNOSIS — I509 Heart failure, unspecified: Secondary | ICD-10-CM | POA: Diagnosis not present

## 2015-05-30 DIAGNOSIS — R739 Hyperglycemia, unspecified: Secondary | ICD-10-CM

## 2015-05-30 LAB — COMPREHENSIVE METABOLIC PANEL
ALT: 21 U/L (ref 14–54)
AST: 34 U/L (ref 15–41)
Albumin: 3.3 g/dL — ABNORMAL LOW (ref 3.5–5.0)
Alkaline Phosphatase: 91 U/L (ref 38–126)
Anion gap: 12 (ref 5–15)
BUN: 32 mg/dL — AB (ref 6–20)
CO2: 24 mmol/L (ref 22–32)
CREATININE: 1.86 mg/dL — AB (ref 0.44–1.00)
Calcium: 8.2 mg/dL — ABNORMAL LOW (ref 8.9–10.3)
Chloride: 91 mmol/L — ABNORMAL LOW (ref 101–111)
GFR calc Af Amer: 31 mL/min — ABNORMAL LOW (ref >60)
GFR calc non Af Amer: 26 mL/min — ABNORMAL LOW (ref >60)
GLUCOSE: 489 mg/dL — AB (ref 65–99)
Potassium: 3.7 mmol/L (ref 3.5–5.1)
SODIUM: 127 mmol/L — AB (ref 135–145)
Total Bilirubin: 0.4 mg/dL (ref 0.3–1.2)
Total Protein: 6.4 g/dL — ABNORMAL LOW (ref 6.5–8.1)

## 2015-05-30 LAB — CBC
HCT: 32.4 % — ABNORMAL LOW (ref 36.0–46.0)
HEMOGLOBIN: 10.7 g/dL — AB (ref 12.0–15.0)
MCH: 29.3 pg (ref 26.0–34.0)
MCHC: 33 g/dL (ref 30.0–36.0)
MCV: 88.8 fL (ref 78.0–100.0)
Platelets: 254 10*3/uL (ref 150–400)
RBC: 3.65 MIL/uL — AB (ref 3.87–5.11)
RDW: 14.2 % (ref 11.5–15.5)
WBC: 8.7 10*3/uL (ref 4.0–10.5)

## 2015-05-30 LAB — CBG MONITORING, ED: Glucose-Capillary: 491 mg/dL — ABNORMAL HIGH (ref 65–99)

## 2015-05-30 MED ORDER — SODIUM CHLORIDE 0.9 % IV BOLUS (SEPSIS)
1000.0000 mL | Freq: Once | INTRAVENOUS | Status: AC
  Administered 2015-05-30: 1000 mL via INTRAVENOUS

## 2015-05-30 NOTE — ED Provider Notes (Signed)
CSN: [id number]     Arrival date & time 05/30/15  2213 History   First MD Initiated Contact with Patient 05/30/15 2249     Chief Complaint  Patient presents with  . Hyperglycemia     (Consider location/radiation/quality/duration/timing/severity/associated sxs/prior Treatment) Patient is a 69 y.o. female presenting with hyperglycemia. The history is provided by the patient.  Hyperglycemia Blood sugar level PTA:  >500 per facility ([facility]) CBG here 491 Severity:  Unable to specify Onset quality:  Unable to specify Duration:  1 day Timing:  Unable to specify Progression:  Unable to specify Chronicity:  New Diabetes status:  Controlled with oral medications Current diabetic therapy:  Metformin  Relieved by:  None tried Ineffective treatments:  None tried Associated symptoms: no abdominal pain, no altered mental status, no chest pain, no dehydration, no diaphoresis, no dizziness, no dysuria, no fever, no increased appetite, no increased thirst, no nausea, no polyuria, no shortness of breath, no vomiting and no weakness       Past Medical History  Diagnosis Date  . Hypertension   . CAD (coronary artery disease)   . 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 Macular edema     hx  . Glaucoma, both eyes   . High cholesterol   . CHF (congestive heart failure)   . Chronic kidney disease (CKD), stage II (mild)     [name] 10/07/2014  . Old myocardial infarct     "I was told I've had one; don't know when" (10/08/2014)  . Pneumonia 1990's X 1  . Hypothyroidism   . Type II diabetes mellitus   . Anemia   . History of blood transfusion 2003    "related to OHS"  . Stroke     "I've been told I've had one; I don't remember having any reaction from it at all"; denies residual on 10/08/2014   Past Surgical History  Procedure Laterality Date  . 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 Cataract extraction w/ intraocular lens  implant, bilateral Bilateral    Family  History  Problem Relation Age of Onset  . CAD Neg Hx    Social History  Substance Use Topics  . Smoking status: Never Smoker   . Smokeless tobacco: Never Used  . Alcohol Use: No   OB History    No data available     Review of Systems  Constitutional: Negative for fever and diaphoresis.  Respiratory: Negative for shortness of breath.   Cardiovascular: Negative for chest pain.  Gastrointestinal: Negative for nausea, vomiting and abdominal pain.  Endocrine: Negative for polydipsia and polyuria.  Genitourinary: Negative for dysuria.  Neurological: Negative for dizziness and weakness.      Allergies  Epinephrine  Home Medications   Prior to Admission medications   Medication Sig Start Date End Date Taking? Authorizing Provider  amLODipine (NORVASC) 10 MG tablet Take 10 mg by mouth daily. 09/27/14  Yes Historical Provider, MD  ARIPiprazole (ABILIFY) 5 MG tablet Take 5 mg by mouth daily.   Yes Historical Provider, MD  ARTIFICIAL TEAR SOLUTION OP Place 1 drop into the left eye 4 (four) times daily.   Yes Historical Provider, MD  aspirin EC 81 MG EC tablet Take 1 tablet (81 mg total) by mouth daily. 11/26/14  Yes Renae Fickle, MD  atorvastatin (LIPITOR) 80 MG tablet Take 80 mg by mouth at bedtime. 09/21/14  Yes Historical Provider, MD  clopidogrel (PLAVIX) 75 MG tablet Take 75  mg by mouth daily. 09/24/14  Yes Historical Provider, MD  dorzolamide-timolol (COSOPT) 22.3-6.8 MG/ML ophthalmic solution Place 1 drop into both eyes every 12 (twelve) hours. 10/06/14  Yes Historical Provider, MD  ferrous sulfate 325 (65 FE) MG tablet Take 1 tablet (325 mg total) by mouth 2 (two) times daily with a meal. 03/24/15  Yes Sharee Holster, NP  furosemide (LASIX) 40 MG tablet Take 40-80 mg by mouth 2 (two) times daily. 2 tab in the morning and 1 in the evening   Yes Historical Provider, MD  hydrALAZINE (APRESOLINE) 100 MG tablet Take 1 tablet (100 mg total) by mouth every 8 (eight) hours. 11/03/14  Yes  Joseph Art, DO  isosorbide dinitrate (ISORDIL) 10 MG tablet Take 2 tablets (20 mg total) by mouth 3 (three) times daily. 10/10/14  Yes Jerald Kief, MD  latanoprost (XALATAN) 0.005 % ophthalmic solution Place 1 drop into the right eye at bedtime.   Yes Historical Provider, MD  levothyroxine (SYNTHROID, LEVOTHROID) 100 MCG tablet Take 1 tablet (100 mcg total) by mouth daily before breakfast. 11/26/14  Yes Renae Fickle, MD  Melatonin 3 MG TABS Take 3 mg by mouth at bedtime.   Yes Historical Provider, MD  omeprazole (PRILOSEC) 20 MG capsule Take 20 mg by mouth daily.   Yes Historical Provider, MD  oxyCODONE-acetaminophen (PERCOCET/ROXICET) 5-325 MG per tablet Take 1 tablet by mouth every 4 (four) hours as needed for severe pain. 03/04/15  Yes Renae Fickle, MD  prednisoLONE acetate (PRED FORTE) 1 % ophthalmic suspension Place 1 drop into the left eye 4 (four) times daily. 09/27/14  Yes Historical Provider, MD  sertraline (ZOLOFT) 100 MG tablet Take 200 mg by mouth at bedtime.   Yes Historical Provider, MD  doxazosin (CARDURA) 4 MG tablet Take 1 tablet (4 mg total) by mouth daily. Patient not taking: Reported on 04/26/2015 11/26/14   Renae Fickle, MD  methocarbamol (ROBAXIN) 500 MG tablet Take 1 tablet (500 mg total) by mouth every 6 (six) hours as needed for muscle spasms. Patient not taking: Reported on 04/26/2015 03/04/15   Renae Fickle, MD  traMADol (ULTRAM) 50 MG tablet Take 1 tablet (50 mg total) by mouth every 6 (six) hours as needed for moderate pain. 03/24/15   Sharee Holster, NP   BP 181/57 mmHg  Pulse 57  Temp(Src) 98.2 F (36.8 C) (Oral)  Resp 21  Ht  (1.549 m)  Wt 137 lb (62.143 kg)  BMI 25.90 kg/m2  SpO2 95% Physical Exam  Constitutional: She is oriented to person, place, and time. She appears well-developed and well-nourished.  HENT:  Head: Normocephalic and atraumatic.  Mouth/Throat: Oropharynx is clear and moist.  Eyes: Pupils are equal, round, and reactive to  light.  Neck: Normal range of motion. Neck supple.  Cardiovascular: Normal rate, regular rhythm, normal heart sounds and intact distal pulses.   No murmur heard. Capillary refill less than 3 seconds.   Pulmonary/Chest: Effort normal and breath sounds normal. No accessory muscle usage. No respiratory distress. She has no wheezes. She has no rales.  Abdominal: Soft. Bowel sounds are normal. She exhibits no distension. There is no tenderness.  Musculoskeletal: Normal range of motion.  Lymphadenopathy:    She has no cervical adenopathy.  Neurological: She is alert and oriented to person, place, and time.  Skin: Skin is warm and dry.  No signs of dehydration.   Psychiatric: She has a normal mood and affect. Her behavior is normal.    ED  Course  Procedures (including critical care time) Labs Review Labs Reviewed  CBC - Abnormal; Notable for the following:    RBC 3.65 (*)    Hemoglobin 10.7 (*)    HCT 32.4 (*)    All other components within normal limits  URINALYSIS, ROUTINE W REFLEX MICROSCOPIC (NOT AT [facility]) - Abnormal; Notable for the following:    APPearance CLOUDY (*)    Glucose, UA >1000 (*)    Protein, ur 100 (*)    Leukocytes, UA MODERATE (*)    All other components within normal limits  COMPREHENSIVE METABOLIC PANEL - Abnormal; Notable for the following:    Sodium 127 (*)    Chloride 91 (*)    Glucose, Bld 489 (*)    BUN 32 (*)    Creatinine, Ser 1.86 (*)    Calcium 8.2 (*)    Total Protein 6.4 (*)    Albumin 3.3 (*)    GFR calc non Af Amer 26 (*)    GFR calc Af Amer 31 (*)    All other components within normal limits  CBG MONITORING, ED - Abnormal; Notable for the following:    Glucose-Capillary 491 (*)    All other components within normal limits  CBG MONITORING, ED - Abnormal; Notable for the following:    Glucose-Capillary 423 (*)    All other components within normal limits  URINE MICROSCOPIC-ADD ON  CBG MONITORING, ED    Imaging Review No results found. I  have personally reviewed and evaluated these images and lab results as part of my medical decision-making.   EKG Interpretation None      MDM   Final diagnoses:  Hyperglycemia    Patient presents with hyperglycemia, per EMS >500.  CBG upon arrive 491.  Pt denies fevers, chills, N/V/D/C, abdominal pain, SOB, CP, polyuria, polydipsia, dysuria.  VSS, pt in NAD, appears non-toxic.  On exam, abdomen soft, nontender, nondistended. Respirations are even and labored, CTAB.  Heart sounds RRR.  Moist mucous membranes.  AOx3.  Concern for DKA.  IVF started.  CBC, UA, CMP pending.  -CBC: WBC 8.7, HGB 10.7 -CMP: anion gap 12, Cr 1.86 which appears to be her baseline.  Na 127, corrected NA 133.   -UA: moderate leukocytosis, many bacteria  12:05 AM: Pt has received almost 2L IVF.  CBG 423.  Will give 5U insulin and recheck CBG in 1 hour.  Plan:  Check CBG approximately 2 AM.  Likely d/c home with scheduled PCP follow up in the AM.  Pt stable.  Patient hand off to [facility], PA-C at shift change.    Cheri Fowler, PA-C 05/31/15 0116  Cheri Fowler, PA-C 06/01/15 Carney Corners, MD 06/07/15 [phone]

## 2015-05-30 NOTE — ED Notes (Signed)
Pt is a resident at [facility] pt's blood sugar was 44 this morning and been fluctuating throughout the day  Pt had a regular coke this evening  Monitor reading hi on EMS  IV was started and NS infusing  Monitor reading SB rate 58   Pt is scheduled to see her primary doctor in the morning

## 2015-05-31 LAB — URINALYSIS, ROUTINE W REFLEX MICROSCOPIC
Bilirubin Urine: NEGATIVE
Glucose, UA: >1000 mg/dL — AB
Hgb urine dipstick: NEGATIVE
KETONES UR: NEGATIVE mg/dL
Nitrite: NEGATIVE
PROTEIN: 100 mg/dL — AB
Specific Gravity, Urine: 1.01 (ref 1.005–1.030)
UROBILINOGEN UA: 0.2 mg/dL (ref 0.0–1.0)
pH: 6 (ref 5.0–8.0)

## 2015-05-31 LAB — CBG MONITORING, ED
GLUCOSE-CAPILLARY: 308 mg/dL — AB (ref 65–99)
Glucose-Capillary: 423 mg/dL — ABNORMAL HIGH (ref 65–99)

## 2015-05-31 LAB — URINE MICROSCOPIC-ADD ON

## 2015-05-31 MED ORDER — ACETAMINOPHEN 325 MG PO TABS: 650.0000 mg | ORAL_TABLET | Freq: Once | ORAL | Status: DC

## 2015-05-31 MED ORDER — CEPHALEXIN 500 MG PO CAPS: 500.0000 mg | ORAL_CAPSULE | Freq: Two times a day (BID) | ORAL | Status: DC

## 2015-05-31 MED ORDER — INSULIN ASPART 100 UNIT/ML ~~LOC~~ SOLN
5.0000 [IU] | Freq: Once | SUBCUTANEOUS | Status: AC
  Administered 2015-05-31: 5 [IU] via INTRAVENOUS
  Filled 2015-05-31 (×2): qty 1

## 2015-05-31 MED ORDER — INSULIN ASPART 100 UNIT/ML ~~LOC~~ SOLN
3.0000 [IU] | Freq: Once | SUBCUTANEOUS | Status: AC
  Administered 2015-05-31: 3 [IU] via SUBCUTANEOUS
  Filled 2015-05-31: qty 1

## 2015-05-31 NOTE — ED Provider Notes (Signed)
1610 - Patient assumed from Cheri Fowler, PA-C at shift change. Plan to improve sugar prior to D/C and to d/c with Rx for Keflex. CBG now improved to 308. Will give additional 3 units sub-Q insulin and d/c. Patient with f/u with her PCP today. Return precautions given at discharge. Patient discharged in good condition via PTAR; VSS.   Filed Vitals:   05/30/15 2213 05/31/15 0000 05/31/15 0130  BP: 151/65 181/57 156/64  Pulse: 56 57 57  Temp: 98.2 F (36.8 C)    TempSrc: Oral    Resp: Height:  (1.549 m)    Weight: 137 lb (62.143 kg)    SpO2: 96% 95% 94%     Antony Madura, PA-C 05/31/15 9604  Devoria Albe, MD 05/31/15 [phone]

## 2015-05-31 NOTE — Discharge Instructions (Signed)
Hyperglycemia °Hyperglycemia occurs when the glucose (sugar) in your blood is too high. Hyperglycemia can happen for many reasons, but it most often happens to people who do not know they have diabetes or are not managing their diabetes properly.  °CAUSES  °Whether you have diabetes or not, there are other causes of hyperglycemia. Hyperglycemia can occur when you have diabetes, but it can also occur in other situations that you might not be as aware of, such as: °Diabetes °· If you have diabetes and are having problems controlling your blood glucose, hyperglycemia could occur because of some of the following reasons: °· Not following your meal plan. °· Not taking your diabetes medications or not taking it properly. °· Exercising less or doing less activity than you normally do. °· Being sick. °Pre-diabetes °· This cannot be ignored. Before people develop Type 2 diabetes, they almost always have "pre-diabetes." This is when your blood glucose levels are higher than normal, but not yet high enough to be diagnosed as diabetes. Research has shown that some long-term damage to the body, especially the heart and circulatory system, may already be occurring during pre-diabetes. If you take action to manage your blood glucose when you have pre-diabetes, you may delay or prevent Type 2 diabetes from developing. °Stress °· If you have diabetes, you may be "diet" controlled or on oral medications or insulin to control your diabetes. However, you may find that your blood glucose is higher than usual in the hospital whether you have diabetes or not. This is often referred to as "stress hyperglycemia." Stress can elevate your blood glucose. This happens because of hormones put out by the body during times of stress. If stress has been the cause of your high blood glucose, it can be followed regularly by your caregiver. That way he/she can make sure your hyperglycemia does not continue to get worse or progress to  diabetes. °Steroids °· Steroids are medications that act on the infection fighting system (immune system) to block inflammation or infection. One side effect can be a rise in blood glucose. Most people can produce enough extra insulin to allow for this rise, but for those who cannot, steroids make blood glucose levels go even higher. It is not unusual for steroid treatments to "uncover" diabetes that is developing. It is not always possible to determine if the hyperglycemia will go away after the steroids are stopped. A special blood test called an A1c is sometimes done to determine if your blood glucose was elevated before the steroids were started. °SYMPTOMS °· Thirsty. °· Frequent urination. °· Dry mouth. °· Blurred vision. °· Tired or fatigue. °· Weakness. °· Sleepy. °· Tingling in feet or leg. °DIAGNOSIS  °Diagnosis is made by monitoring blood glucose in one or all of the following ways: °· A1c test. This is a chemical found in your blood. °· Fingerstick blood glucose monitoring. °· Laboratory results. °TREATMENT  °First, knowing the cause of the hyperglycemia is important before the hyperglycemia can be treated. Treatment may include, but is not be limited to: °· Education. °· Change or adjustment in medications. °· Change or adjustment in meal plan. °· Treatment for an illness, infection, etc. °· More frequent blood glucose monitoring. °· Change in exercise plan. °· Decreasing or stopping steroids. °· Lifestyle changes. °HOME CARE INSTRUCTIONS  °· Test your blood glucose as directed. °· Exercise regularly. Your caregiver will give you instructions about exercise. Pre-diabetes or diabetes which comes on with stress is helped by exercising. °· Eat wholesome,   balanced meals. Eat often and at regular, fixed times. Your caregiver or nutritionist will give you a meal plan to guide your sugar intake. °· Being at an ideal weight is important. If needed, losing as little as 10 to 15 pounds may help improve blood  glucose levels. °SEEK MEDICAL CARE IF:  °· You have questions about medicine, activity, or diet. °· You continue to have symptoms (problems such as increased thirst, urination, or weight gain). °SEEK IMMEDIATE MEDICAL CARE IF:  °· You are vomiting or have diarrhea. °· Your breath smells fruity. °· You are breathing faster or slower. °· You are very sleepy or incoherent. °· You have numbness, tingling, or pain in your feet or hands. °· You have chest pain. °· Your symptoms get worse even though you have been following your caregiver's orders. °· If you have any other questions or concerns. °Document Released: 02/14/2001 Document Revised: 11/13/2011 Document Reviewed: 12/18/2011 °ExitCare® Patient Information ©2015 [employer]. This information is not intended to replace advice given to you by your health care provider. Make sure you discuss any questions you have with your health care provider. ° °Urinary Tract Infection °Urinary tract infections (UTIs) can develop anywhere along your urinary tract. Your urinary tract is your body's drainage system for removing wastes and extra water. Your urinary tract includes two kidneys, two ureters, a bladder, and a urethra. Your kidneys are a pair of bean-shaped organs. Each kidney is about the size of your fist. They are located below your ribs, one on each side of your spine. °CAUSES °Infections are caused by microbes, which are microscopic organisms, including fungi, viruses, and bacteria. These organisms are so small that they can only be seen through a microscope. Bacteria are the microbes that most commonly cause UTIs. °SYMPTOMS  °Symptoms of UTIs may vary by age and gender of the patient and by the location of the infection. Symptoms in young women typically include a frequent and intense urge to urinate and a painful, burning feeling in the bladder or urethra during urination. Older women and men are more likely to be tired, shaky, and weak and have muscle aches and  abdominal pain. A fever may mean the infection is in your kidneys. Other symptoms of a kidney infection include pain in your back or sides below the ribs, nausea, and vomiting. °DIAGNOSIS °To diagnose a UTI, your caregiver will ask you about your symptoms. Your caregiver also will ask to provide a urine sample. The urine sample will be tested for bacteria and white blood cells. White blood cells are made by your body to help fight infection. °TREATMENT  °Typically, UTIs can be treated with medication. Because most UTIs are caused by a bacterial infection, they usually can be treated with the use of antibiotics. The choice of antibiotic and length of treatment depend on your symptoms and the type of bacteria causing your infection. °HOME CARE INSTRUCTIONS °· If you were prescribed antibiotics, take them exactly as your caregiver instructs you. Finish the medication even if you feel better after you have only taken some of the medication. °· Drink enough water and fluids to keep your urine clear or pale yellow. °· Avoid caffeine, tea, and carbonated beverages. They tend to irritate your bladder. °· Empty your bladder often. Avoid holding urine for long periods of time. °· Empty your bladder before and after sexual intercourse. °· After a bowel movement, women should cleanse from front to back. Use each tissue only once. °SEEK MEDICAL CARE IF:  °·   You have back pain. °· You develop a fever. °· Your symptoms do not begin to resolve within 3 days. °SEEK IMMEDIATE MEDICAL CARE IF:  °· You have severe back pain or lower abdominal pain. °· You develop chills. °· You have nausea or vomiting. °· You have continued burning or discomfort with urination. °MAKE SURE YOU:  °· Understand these instructions. °· Will watch your condition. °· Will get help right away if you are not doing well or get worse. °Document Released: 05/31/2005 Document Revised: 02/20/2012 Document Reviewed: 09/29/2011 °ExitCare® Patient Information ©2015  [employer]. This information is not intended to replace advice given to you by your health care provider. Make sure you discuss any questions you have with your health care provider. ° °

## 2015-05-31 NOTE — ED Provider Notes (Signed)
  This chart was scribed for Devoria Albe, MD by Arlan Organ, ED Scribe. This patient was seen in room WA08/WA08 and the patient's care was started 1:50 AM.   Taylor Padilla is a 69 y.o. female who presents to the Emergency Department here for hyperglycemia this evening. Pt was brought in this evening from [facility] after a reported blood sugar reading over 500. Per triage note, pts blood sugar was 44 this morning but had fluctuated throughout the day. Pt had 1 coke this evening and denies any associated abnormal symptoms. Denies any pain at this time. Pt was given IV fluids and IV insulin.  Blood sugar has improved to 308.  CONSTITUTIONAL: Well developed/well nourished EYES left pupil is dilated compared to the right LUNGS: Lungs are clear to auscultation bilaterally, no apparent distress NEURO: Pt is awake/alert/appropriate, moves all extremitiesx4.  No facial droop.   SKIN: warm, color normal PSYCH: no abnormalities of mood noted, alert and oriented to situation   Medical screening examination/treatment/procedure(s) were conducted as a shared visit with non-physician practitioner(s) and myself.  I personally evaluated the patient during the encounter.   EKG Interpretation None       Devoria Albe, MD, FACEP    I personally performed the services described in this documentation, which was scribed in my presence. The recorded information has been reviewed and considered.  Devoria Albe, MD, Concha Pyo, MD 05/31/15 [phone]

## 2015-06-01 ENCOUNTER — Emergency Department (HOSPITAL_COMMUNITY): Payer: Medicare PPO

## 2015-06-01 ENCOUNTER — Emergency Department (HOSPITAL_COMMUNITY)
Admission: EM | Admit: 2015-06-01 | Discharge: 2015-06-01 | Disposition: A | Discharge status: Home / Self Care | Payer: Medicare PPO | Attending: Emergency Medicine | Admitting: Emergency Medicine

## 2015-06-01 ENCOUNTER — Encounter (HOSPITAL_COMMUNITY): Payer: Self-pay | Admitting: Emergency Medicine

## 2015-06-01 DIAGNOSIS — Z79899 Other long term (current) drug therapy: Secondary | ICD-10-CM | POA: Diagnosis not present

## 2015-06-01 DIAGNOSIS — I252 Old myocardial infarction: Secondary | ICD-10-CM | POA: Insufficient documentation

## 2015-06-01 DIAGNOSIS — E039 Hypothyroidism, unspecified: Secondary | ICD-10-CM | POA: Diagnosis not present

## 2015-06-01 DIAGNOSIS — I509 Heart failure, unspecified: Secondary | ICD-10-CM | POA: Insufficient documentation

## 2015-06-01 DIAGNOSIS — I129 Hypertensive chronic kidney disease with stage 1 through stage 4 chronic kidney disease, or unspecified chronic kidney disease: Secondary | ICD-10-CM | POA: Diagnosis not present

## 2015-06-01 DIAGNOSIS — Z8673 Personal history of transient ischemic attack (TIA), and cerebral infarction without residual deficits: Secondary | ICD-10-CM | POA: Insufficient documentation

## 2015-06-01 DIAGNOSIS — S92912D Unspecified fracture of left toe(s), subsequent encounter for fracture with routine healing: Secondary | ICD-10-CM

## 2015-06-01 DIAGNOSIS — Z8701 Personal history of pneumonia (recurrent): Secondary | ICD-10-CM | POA: Diagnosis not present

## 2015-06-01 DIAGNOSIS — N182 Chronic kidney disease, stage 2 (mild): Secondary | ICD-10-CM | POA: Insufficient documentation

## 2015-06-01 DIAGNOSIS — E780 Pure hypercholesterolemia: Secondary | ICD-10-CM | POA: Insufficient documentation

## 2015-06-01 DIAGNOSIS — X58XXXD Exposure to other specified factors, subsequent encounter: Secondary | ICD-10-CM | POA: Diagnosis not present

## 2015-06-01 DIAGNOSIS — E876 Hypokalemia: Secondary | ICD-10-CM | POA: Diagnosis not present

## 2015-06-01 DIAGNOSIS — D649 Anemia, unspecified: Secondary | ICD-10-CM | POA: Diagnosis not present

## 2015-06-01 DIAGNOSIS — E1140 Type 2 diabetes mellitus with diabetic neuropathy, unspecified: Secondary | ICD-10-CM | POA: Insufficient documentation

## 2015-06-01 DIAGNOSIS — Z7902 Long term (current) use of antithrombotics/antiplatelets: Secondary | ICD-10-CM | POA: Diagnosis not present

## 2015-06-01 DIAGNOSIS — M79675 Pain in left toe(s): Secondary | ICD-10-CM

## 2015-06-01 DIAGNOSIS — Z7952 Long term (current) use of systemic steroids: Secondary | ICD-10-CM | POA: Diagnosis not present

## 2015-06-01 DIAGNOSIS — Z7982 Long term (current) use of aspirin: Secondary | ICD-10-CM | POA: Diagnosis not present

## 2015-06-01 DIAGNOSIS — M7989 Other specified soft tissue disorders: Secondary | ICD-10-CM | POA: Diagnosis present

## 2015-06-01 DIAGNOSIS — H409 Unspecified glaucoma: Secondary | ICD-10-CM | POA: Insufficient documentation

## 2015-06-01 DIAGNOSIS — I2510 Atherosclerotic heart disease of native coronary artery without angina pectoris: Secondary | ICD-10-CM | POA: Insufficient documentation

## 2015-06-01 DIAGNOSIS — S92422D Displaced fracture of distal phalanx of left great toe, subsequent encounter for fracture with routine healing: Secondary | ICD-10-CM | POA: Diagnosis not present

## 2015-06-01 DIAGNOSIS — Z951 Presence of aortocoronary bypass graft: Secondary | ICD-10-CM | POA: Insufficient documentation

## 2015-06-01 DIAGNOSIS — H540 Blindness, both eyes: Secondary | ICD-10-CM | POA: Diagnosis not present

## 2015-06-01 LAB — CBC WITH DIFFERENTIAL/PLATELET
BASOS ABS: 0 10*3/uL (ref 0.0–0.1)
BASOS PCT: 0 %
EOS PCT: 2 %
Eosinophils Absolute: 0.2 10*3/uL (ref 0.0–0.7)
HCT: 31.9 % — ABNORMAL LOW (ref 36.0–46.0)
Hemoglobin: 10.6 g/dL — ABNORMAL LOW (ref 12.0–15.0)
Lymphocytes Relative: 10 %
Lymphs Abs: 0.8 10*3/uL (ref 0.7–4.0)
MCH: 29.5 pg (ref 26.0–34.0)
MCHC: 33.2 g/dL (ref 30.0–36.0)
MCV: 88.9 fL (ref 78.0–100.0)
MONO ABS: 0.6 10*3/uL (ref 0.1–1.0)
Monocytes Relative: 7 %
Neutro Abs: 6.9 10*3/uL (ref 1.7–7.7)
Neutrophils Relative %: 81 %
PLATELETS: 221 10*3/uL (ref 150–400)
RBC: 3.59 MIL/uL — ABNORMAL LOW (ref 3.87–5.11)
RDW: 14.4 % (ref 11.5–15.5)
WBC: 8.5 10*3/uL (ref 4.0–10.5)

## 2015-06-01 LAB — BASIC METABOLIC PANEL
ANION GAP: 7 (ref 5–15)
BUN: 30 mg/dL — ABNORMAL HIGH (ref 6–20)
CALCIUM: 8.5 mg/dL — AB (ref 8.9–10.3)
CO2: 29 mmol/L (ref 22–32)
Chloride: 94 mmol/L — ABNORMAL LOW (ref 101–111)
Creatinine, Ser: 1.7 mg/dL — ABNORMAL HIGH (ref 0.44–1.00)
GFR, EST AFRICAN AMERICAN: 34 mL/min — AB (ref >60)
GFR, EST NON AFRICAN AMERICAN: 29 mL/min — AB (ref >60)
GLUCOSE: 295 mg/dL — AB (ref 65–99)
Potassium: 2.9 mmol/L — ABNORMAL LOW (ref 3.5–5.1)
Sodium: 130 mmol/L — ABNORMAL LOW (ref 135–145)

## 2015-06-01 MED ORDER — POTASSIUM CHLORIDE ER 10 MEQ PO TBCR: 10.0000 meq | EXTENDED_RELEASE_TABLET | Freq: Every day | ORAL | Status: DC

## 2015-06-01 MED ORDER — ACETAMINOPHEN 325 MG PO TABS
650.0000 mg | ORAL_TABLET | Freq: Once | ORAL | Status: AC
  Administered 2015-06-01: 650 mg via ORAL
  Filled 2015-06-01: qty 2

## 2015-06-01 MED ORDER — POTASSIUM CHLORIDE CRYS ER 20 MEQ PO TBCR
40.0000 meq | EXTENDED_RELEASE_TABLET | Freq: Once | ORAL | Status: AC
  Administered 2015-06-01: 40 meq via ORAL
  Filled 2015-06-01: qty 2

## 2015-06-01 NOTE — Discharge Instructions (Signed)
- Schedule a follow up appointment with your PCP to discuss low Potassium levels   Musculoskeletal Pain Musculoskeletal pain is muscle and boney aches and pains. These pains can occur in any part of the body. Your caregiver may treat you without knowing the cause of the pain. They may treat you if blood or urine tests, X-rays, and other tests were normal.  CAUSES There is often not a definite cause or reason for these pains. These pains may be caused by a type of germ (virus). The discomfort may also come from overuse. Overuse includes working out too hard when your body is not fit. Boney aches also come from weather changes. Bone is sensitive to atmospheric pressure changes. HOME CARE INSTRUCTIONS   Ask when your test results will be ready. Make sure you get your test results.  Only take over-the-counter or prescription medicines for pain, discomfort, or fever as directed by your caregiver. If you were given medications for your condition, do not drive, operate machinery or power tools, or sign legal documents for 24 hours. Do not drink alcohol. Do not take sleeping pills or other medications that may interfere with treatment.  Continue all activities unless the activities cause more pain. When the pain lessens, slowly resume normal activities. Gradually increase the intensity and duration of the activities or exercise.  During periods of severe pain, bed rest may be helpful. Lay or sit in any position that is comfortable.  Putting ice on the injured area.  Put ice in a bag.  Place a towel between your skin and the bag.  Leave the ice on for 15 to 20 minutes, 3 to 4 times a day.  Follow up with your caregiver for continued problems and no reason can be found for the pain. If the pain becomes worse or does not go away, it may be necessary to repeat tests or do additional testing. Your caregiver may need to look further for a possible cause. SEEK IMMEDIATE MEDICAL CARE IF:  You have pain  that is getting worse and is not relieved by medications.  You develop chest pain that is associated with shortness or breath, sweating, feeling sick to your stomach (nauseous), or throw up (vomit).  Your pain becomes localized to the abdomen.  You develop any new symptoms that seem different or that concern you. MAKE SURE YOU:   Understand these instructions.  Will watch your condition.  Will get help right away if you are not doing well or get worse. Document Released: 08/21/2005 Document Revised: 11/13/2011 Document Reviewed: 04/25/2013 [facility] Patient Information 2015 [place], [place]. This information is not intended to replace advice given to you by your health care provider. Make sure you discuss any questions you have with your health care provider.  Hypokalemia Hypokalemia means that the amount of potassium in the blood is lower than normal.Potassium is a chemical, called an electrolyte, that helps regulate the amount of fluid in the body. It also stimulates muscle contraction and helps nerves function properly.Most of the body's potassium is inside of cells, and only a very small amount is in the blood. Because the amount in the blood is so small, minor changes can be life-threatening. CAUSES  Antibiotics.  Diarrhea or vomiting.  Using laxatives too much, which can cause diarrhea.  Chronic kidney disease.  Water pills (diuretics).  Eating disorders (bulimia).  Low magnesium level.  Sweating a lot. SIGNS AND SYMPTOMS  Weakness.  Constipation.  Fatigue.  Muscle cramps.  Mental confusion.  Skipped  heartbeats or irregular heartbeat (palpitations).  Tingling or numbness. DIAGNOSIS  Your health care provider can diagnose hypokalemia with blood tests. In addition to checking your potassium level, your health care provider may also check other lab tests. TREATMENT Hypokalemia can be treated with potassium supplements taken by mouth or adjustments in your  current medicines. If your potassium level is very low, you may need to get potassium through a vein (IV) and be monitored in the hospital. A diet high in potassium is also helpful. Foods high in potassium are:  Nuts, such as peanuts and pistachios.  Seeds, such as sunflower seeds and pumpkin seeds.  Peas, lentils, and lima beans.  Whole grain and bran cereals and breads.  Fresh fruit and vegetables, such as apricots, avocado, bananas, cantaloupe, kiwi, oranges, tomatoes, asparagus, and potatoes.  Orange and tomato juices.  Red meats.  Fruit yogurt. HOME CARE INSTRUCTIONS  Take all medicines as prescribed by your health care provider.  Maintain a healthy diet by including nutritious food, such as fruits, vegetables, nuts, whole grains, and lean meats.  If you are taking a laxative, be sure to follow the directions on the label. SEEK MEDICAL CARE IF:  Your weakness gets worse.  You feel your heart pounding or racing.  You are vomiting or having diarrhea.  You are diabetic and having trouble keeping your blood glucose in the normal range. SEEK IMMEDIATE MEDICAL CARE IF:  You have chest pain, shortness of breath, or dizziness.  You are vomiting or having diarrhea for more than 2 days.  You faint. MAKE SURE YOU:   Understand these instructions.  Will watch your condition.  Will get help right away if you are not doing well or get worse. Document Released: 08/21/2005 Document Revised: 06/11/2013 Document Reviewed: 02/21/2013 [facility] Patient Information 2015 [place], [place]. This information is not intended to replace advice given to you by your health care provider. Make sure you discuss any questions you have with your health care provider.

## 2015-06-01 NOTE — ED Provider Notes (Signed)
CSN: [id number]     Arrival date & time 06/01/15  1251 History   First MD Initiated Contact with Patient 06/01/15 1459     Chief Complaint  Patient presents with  . Toe swelling    . Blindness   HPI  Ms. Leamer is a 69 year old female with PMHx of HTN, CAD, blindness, CHF, CKD and DM2 presenting with great toe swelling. Pt states that she woke up this morning and noted pain in her left great toe. Pt is blind and unable to see her feet but believes her toe feels more swollen than usual. Pain is described as sharp and exacerbated by walking and moving it. She has not tried any pain medications yet. Pt states she has mild peripheral neuropathy from her DM but denies total lack of sensation in her feet and toes. She denies recent trauma to the toe. She has never had gout or arthritis in the toe. Denies fevers, chills, nausea or vomiting.   Past Medical History  Diagnosis Date  . Hypertension   . CAD (coronary artery disease)   . 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 Macular edema     hx  . Glaucoma, both eyes   . High cholesterol   . CHF (congestive heart failure)   . Chronic kidney disease (CKD), stage II (mild)     [name] 10/07/2014  . Old myocardial infarct     "I was told I've had one; don't know when" (10/08/2014)  . Pneumonia 1990's X 1  . Hypothyroidism   . Type II diabetes mellitus   . Anemia   . History of blood transfusion 2003    "related to [facility]HS"  . Stroke     "I've been told I've had one; I don't remember having any reaction from it at all"; denies residual on 10/08/2014   Past Surgical History  Procedure Laterality Date  . Coronary artery bypass graft  01/2002    "CABG X4" at [facility]  . Cataract extraction w/ intraocular lens  implant, bilateral Bilateral    Family History  Problem Relation Age of Onset  . CAD Neg Hx    Social History  Substance Use Topics  . Smoking status: Never Smoker   . Smokeless tobacco: Never Used  . Alcohol Use: No   OB History    No  data available     Review of Systems  Constitutional: Negative for fever and chills.  Gastrointestinal: Negative for nausea, vomiting and abdominal pain.  Musculoskeletal: Positive for joint swelling, arthralgias and gait problem.      Allergies  Epinephrine  Home Medications   Prior to Admission medications   Medication Sig Start Date End Date Taking? Authorizing Provider  amLODipine (NORVASC) 10 MG tablet Take 10 mg by mouth daily. 09/27/14  Yes Historical Provider, MD  ARIPiprazole (ABILIFY) 5 MG tablet Take 5 mg by mouth daily.   Yes Historical Provider, MD  ARTIFICIAL TEAR SOLUTION OP Place 1 drop into the left eye 4 (four) times daily.   Yes Historical Provider, MD  aspirin EC 81 MG EC tablet Take 1 tablet (81 mg total) by mouth daily. 11/26/14  Yes Renae Fickle, MD  atorvastatin (LIPITOR) 80 MG tablet Take 80 mg by mouth at bedtime. 09/21/14  Yes Historical Provider, MD  cephALEXin (KEFLEX) 500 MG capsule Take 1 capsule (500 mg total) by mouth 2 (two) times daily. 05/31/15  Yes Cheri Fowler, PA-C  clopidogrel (PLAVIX) 75 MG tablet Take 75 mg by  mouth daily. 09/24/14  Yes Historical Provider, MD  dorzolamide-timolol (COSOPT) 22.3-6.8 MG/ML ophthalmic solution Place 1 drop into both eyes every 12 (twelve) hours. 10/06/14  Yes Historical Provider, MD  ferrous sulfate 325 (65 FE) MG tablet Take 1 tablet (325 mg total) by mouth 2 (two) times daily with a meal. 03/24/15  Yes Sharee Holster, NP  furosemide (LASIX) 40 MG tablet Take 40-80 mg by mouth daily. 80 mg every morning and 40 mg every evening   Yes Historical Provider, MD  hydrALAZINE (APRESOLINE) 100 MG tablet Take 1 tablet (100 mg total) by mouth every 8 (eight) hours. 11/03/14  Yes Joseph Art, DO  isosorbide dinitrate (ISORDIL) 10 MG tablet Take 2 tablets (20 mg total) by mouth 3 (three) times daily. 10/10/14  Yes Jerald Kief, MD  latanoprost (XALATAN) 0.005 % ophthalmic solution Place 1 drop into the right eye at bedtime.   Yes  Historical Provider, MD  levothyroxine (SYNTHROID, LEVOTHROID) 100 MCG tablet Take 1 tablet (100 mcg total) by mouth daily before breakfast. 11/26/14  Yes Renae Fickle, MD  Melatonin 3 MG TABS Take 3 mg by mouth at bedtime.   Yes Historical Provider, MD  omeprazole (PRILOSEC) 20 MG capsule Take 20 mg by mouth daily.   Yes Historical Provider, MD  oxyCODONE-acetaminophen (PERCOCET/ROXICET) 5-325 MG per tablet Take 1 tablet by mouth every 4 (four) hours as needed for severe pain. 03/04/15  Yes Renae Fickle, MD  prednisoLONE acetate (PRED FORTE) 1 % ophthalmic suspension Place 1 drop into the left eye 4 (four) times daily. 09/27/14  Yes Historical Provider, MD  ranitidine (ZANTAC) 150 MG tablet Take 150 mg by mouth 2 (two) times daily as needed for heartburn.   Yes Historical Provider, MD  sertraline (ZOLOFT) 100 MG tablet Take 200 mg by mouth at bedtime.   Yes Historical Provider, MD  traMADol (ULTRAM) 50 MG tablet Take 1 tablet (50 mg total) by mouth every 6 (six) hours as needed for moderate pain. 03/24/15  Yes Sharee Holster, NP  doxazosin (CARDURA) 4 MG tablet Take 1 tablet (4 mg total) by mouth daily. Patient not taking: Reported on 04/26/2015 11/26/14   Renae Fickle, MD  methocarbamol (ROBAXIN) 500 MG tablet Take 1 tablet (500 mg total) by mouth every 6 (six) hours as needed for muscle spasms. Patient not taking: Reported on 04/26/2015 03/04/15   Renae Fickle, MD  potassium chloride (K-DUR) 10 MEQ tablet Take 1 tablet (10 mEq total) by mouth daily. 06/01/15   Stevi Barrett, PA-C   BP 156/56 mmHg  Pulse 89  Temp(Src) 98.3 F (36.8 C) (Oral)  Resp 17  SpO2 94% Physical Exam  Constitutional: She appears well-developed and well-nourished. No distress.  HENT:  Head: Normocephalic and atraumatic.  Eyes: Conjunctivae are normal. Right eye exhibits no discharge. Left eye exhibits no discharge. No scleral icterus.  Neck: Normal range of motion.  Cardiovascular: Normal rate and regular  rhythm.   Pulmonary/Chest: Effort normal. No respiratory distress.  Musculoskeletal: Normal range of motion.  Left great toe slight more edematous than right. Toe and distal 1st metatarsal are TTP. Toe at rest has PIP flexion. Pt able to move all toes on command. No erythema of toe. No drainage from nailbed or signs of abscess.   Neurological: She is alert. Coordination normal.  5/5 motor strength of ankles b/l. Sensation to light touch intact over toes and feet.   Skin: Skin is warm and dry. No erythema.  Psychiatric: She has a normal  mood and affect. Her behavior is normal.  Nursing note and vitals reviewed.   ED Course  Procedures (including critical care time) Labs Review Labs Reviewed  CBC WITH DIFFERENTIAL/PLATELET - Abnormal; Notable for the following:    RBC 3.59 (*)    Hemoglobin 10.6 (*)    HCT 31.9 (*)    All other components within normal limits  BASIC METABOLIC PANEL - Abnormal; Notable for the following:    Sodium 130 (*)    Potassium 2.9 (*)    Chloride 94 (*)    Glucose, Bld 295 (*)    BUN 30 (*)    Creatinine, Ser 1.70 (*)    Calcium 8.5 (*)    GFR calc non Af Amer 29 (*)    GFR calc Af Amer 34 (*)    All other components within normal limits    Imaging Review Dg Toe Great Left  06/01/2015   CLINICAL DATA:  Pain of left great toe  EXAM: LEFT GREAT TOE  COMPARISON:  None.  FINDINGS: There is healing fracture of distal first metatarsal. There is no dislocation.  IMPRESSION: Healing fracture of distal first metatarsal.   Electronically Signed   By: Sherian Rein M.D.   On: 06/01/2015 16:23   I have personally reviewed and evaluated these images and lab results as part of my medical decision-making.   EKG Interpretation None      MDM   Final diagnoses:  Pain of toe of left foot  Hypokalemia  Toe fracture, left, with routine healing, subsequent encounter   Pt presenting with left great toe pain and sensation of swelling. Pt is blind and cannot visualize  her toe. Denies recent trauma to toe. Left great toe has normal ROM. Toe is slightly more swollen than right great toe. No erythema or drainage from toe. Great toe xray shows a healing fracture of distal 1st metatarsal. Pt has no recollection of recent trauma that could have broken her toe. Pt also noted to have hypokalemia to 2.9. Given 40 meq in ED. Hypokalemia likely 2/2 to lasix. Advised pt to follow up with her PCP for this. Given short course of 10 meq K daily until pt can get in to see PCP. Return precautions given in discharge paperwork and discussed with pt at bedside. Pt stable for discharge     Alveta Heimlich, PA-C 06/01/15 1904  Bethann Berkshire, MD 06/02/15 [phone]

## 2015-06-01 NOTE — ED Notes (Signed)
MD at bedside. 

## 2015-06-01 NOTE — ED Notes (Signed)
Ortho called for post op boot. 

## 2015-06-01 NOTE — ED Notes (Addendum)
Per EMS-pt reports left big toe swelling starting this morning. Denies falling/recent injury/trauma to toe. Says, "I just woke up and it was like that." Recently ended a Keflex regimen for a UTI. Denies hx gout. Pt presents from [facility]. VSS.

## 2015-06-01 NOTE — ED Notes (Signed)
PTAR called  

## 2015-06-01 NOTE — ED Notes (Signed)
PA at bedside.

## 2015-06-02 LAB — URINE CULTURE

## 2015-06-03 ENCOUNTER — Telehealth (HOSPITAL_BASED_OUTPATIENT_CLINIC_OR_DEPARTMENT_OTHER): Payer: Self-pay | Admitting: Emergency Medicine

## 2015-06-03 NOTE — Telephone Encounter (Signed)
Post ED Visit - Positive Culture Follow-up  Culture report reviewed by antimicrobial stewardship pharmacist:   Celedonio Miyamoto, Pharm.D., BCPS  Georgina Pillion, Pharm.D., BCPS  [place], [address].D., BCPS, AAHIVP  Estella Husk, Pharm.D., BCPS, AAHIVP  [place], [address].D.  Tennis Must, Pharm.D.  Positive urine culture E. coli Treated with cephalexin, organism sensitive to the same and no further patient follow-up is required at this time.  Berle Mull 06/03/2015, 9:59 AM

## 2015-07-01 ENCOUNTER — Telehealth (HOSPITAL_COMMUNITY): Payer: Self-pay

## 2015-07-01 NOTE — Telephone Encounter (Signed)
[facility]   Patient is on Lasix 40 mg bid Her weight on 06/24/15 = 136.2 lb Her weight today = 141.8  Home Health has an order to call for >3 lb weight gain  Routed to Dr. Gala RomneyBensimhon

## 2015-07-01 NOTE — Telephone Encounter (Signed)
Orders sent to medical aides at [facility] Orders from Dr. Gala RomneyBensimhon faxed to [phone]) Per Dr. Algis Downs Binsimhon: Extra 40 mg of lasix today Draw BMET tomorrow Follow up with her PCP about noted elevated creatinine levels noted on recent labs Call and make a follow up appointment with Dr. Gala RomneyBensimhon [phone])

## 2015-07-02 ENCOUNTER — Telehealth (HOSPITAL_COMMUNITY): Payer: Self-pay

## 2015-07-02 NOTE — Telephone Encounter (Signed)
[facility]N went to draw BMET today and patient refused saying that she already had lab drawn today She will follow up on Monday.

## 2015-07-05 NOTE — Telephone Encounter (Signed)
Home Health nurse called and received lab results and orders from her PCP.  Patients BMET shoes a Creat of 1.78/ BUN 30  Weight is ordered to be done in the evening which was 136.6 last evening Her PCP increased her Lasix to 60 mg for 3 days with a BMET next Friday She has an appointment with Dr. Bishop DublinBesimhon November 7th

## 2015-07-10 ENCOUNTER — Emergency Department (HOSPITAL_COMMUNITY)
Admission: EM | Admit: 2015-07-10 | Discharge: 2015-07-10 | Disposition: A | Discharge status: Home / Self Care | Payer: Medicare PPO | Attending: Emergency Medicine | Admitting: Emergency Medicine

## 2015-07-10 ENCOUNTER — Encounter (HOSPITAL_COMMUNITY): Payer: Self-pay | Admitting: Oncology

## 2015-07-10 DIAGNOSIS — I2510 Atherosclerotic heart disease of native coronary artery without angina pectoris: Secondary | ICD-10-CM | POA: Insufficient documentation

## 2015-07-10 DIAGNOSIS — Z79899 Other long term (current) drug therapy: Secondary | ICD-10-CM | POA: Insufficient documentation

## 2015-07-10 DIAGNOSIS — Z792 Long term (current) use of antibiotics: Secondary | ICD-10-CM | POA: Insufficient documentation

## 2015-07-10 DIAGNOSIS — H409 Unspecified glaucoma: Secondary | ICD-10-CM | POA: Diagnosis not present

## 2015-07-10 DIAGNOSIS — E039 Hypothyroidism, unspecified: Secondary | ICD-10-CM | POA: Insufficient documentation

## 2015-07-10 DIAGNOSIS — I129 Hypertensive chronic kidney disease with stage 1 through stage 4 chronic kidney disease, or unspecified chronic kidney disease: Secondary | ICD-10-CM | POA: Diagnosis not present

## 2015-07-10 DIAGNOSIS — D649 Anemia, unspecified: Secondary | ICD-10-CM | POA: Insufficient documentation

## 2015-07-10 DIAGNOSIS — Z7982 Long term (current) use of aspirin: Secondary | ICD-10-CM | POA: Insufficient documentation

## 2015-07-10 DIAGNOSIS — N182 Chronic kidney disease, stage 2 (mild): Secondary | ICD-10-CM | POA: Diagnosis not present

## 2015-07-10 DIAGNOSIS — R011 Cardiac murmur, unspecified: Secondary | ICD-10-CM | POA: Diagnosis not present

## 2015-07-10 DIAGNOSIS — I252 Old myocardial infarction: Secondary | ICD-10-CM | POA: Insufficient documentation

## 2015-07-10 DIAGNOSIS — I509 Heart failure, unspecified: Secondary | ICD-10-CM | POA: Insufficient documentation

## 2015-07-10 DIAGNOSIS — Z7902 Long term (current) use of antithrombotics/antiplatelets: Secondary | ICD-10-CM | POA: Insufficient documentation

## 2015-07-10 DIAGNOSIS — E7800 Pure hypercholesterolemia, unspecified: Secondary | ICD-10-CM | POA: Insufficient documentation

## 2015-07-10 DIAGNOSIS — H540 Blindness, both eyes: Secondary | ICD-10-CM | POA: Insufficient documentation

## 2015-07-10 DIAGNOSIS — E162 Hypoglycemia, unspecified: Secondary | ICD-10-CM

## 2015-07-10 DIAGNOSIS — E11649 Type 2 diabetes mellitus with hypoglycemia without coma: Secondary | ICD-10-CM | POA: Diagnosis not present

## 2015-07-10 DIAGNOSIS — Z8701 Personal history of pneumonia (recurrent): Secondary | ICD-10-CM | POA: Diagnosis not present

## 2015-07-10 DIAGNOSIS — Z8673 Personal history of transient ischemic attack (TIA), and cerebral infarction without residual deficits: Secondary | ICD-10-CM | POA: Diagnosis not present

## 2015-07-10 DIAGNOSIS — F0390 Unspecified dementia without behavioral disturbance: Secondary | ICD-10-CM | POA: Insufficient documentation

## 2015-07-10 DIAGNOSIS — Z7952 Long term (current) use of systemic steroids: Secondary | ICD-10-CM | POA: Diagnosis not present

## 2015-07-10 DIAGNOSIS — Z951 Presence of aortocoronary bypass graft: Secondary | ICD-10-CM | POA: Diagnosis not present

## 2015-07-10 LAB — CBC WITH DIFFERENTIAL/PLATELET
BASOS ABS: 0 10*3/uL (ref 0.0–0.1)
BASOS PCT: 0 %
Eosinophils Absolute: 0 10*3/uL (ref 0.0–0.7)
Eosinophils Relative: 0 %
HEMATOCRIT: 30.8 % — AB (ref 36.0–46.0)
HEMOGLOBIN: 10.3 g/dL — AB (ref 12.0–15.0)
Lymphocytes Relative: 5 %
Lymphs Abs: 0.5 10*3/uL — ABNORMAL LOW (ref 0.7–4.0)
MCH: 30.3 pg (ref 26.0–34.0)
MCHC: 33.4 g/dL (ref 30.0–36.0)
MCV: 90.6 fL (ref 78.0–100.0)
MONO ABS: 0.4 10*3/uL (ref 0.1–1.0)
Monocytes Relative: 4 %
NEUTROS ABS: 9.2 10*3/uL — AB (ref 1.7–7.7)
Neutrophils Relative %: 91 %
Platelets: 265 10*3/uL (ref 150–400)
RBC: 3.4 MIL/uL — AB (ref 3.87–5.11)
RDW: 14.1 % (ref 11.5–15.5)
WBC: 10.1 10*3/uL (ref 4.0–10.5)

## 2015-07-10 LAB — URINALYSIS, ROUTINE W REFLEX MICROSCOPIC
Bilirubin Urine: NEGATIVE
Glucose, UA: NEGATIVE mg/dL
Hgb urine dipstick: NEGATIVE
KETONES UR: NEGATIVE mg/dL
LEUKOCYTES UA: NEGATIVE
NITRITE: NEGATIVE
PH: 7 (ref 5.0–8.0)
PROTEIN: 100 mg/dL — AB
Specific Gravity, Urine: 1.007 (ref 1.005–1.030)
Urobilinogen, UA: 0.2 mg/dL (ref 0.0–1.0)

## 2015-07-10 LAB — CBG MONITORING, ED
GLUCOSE-CAPILLARY: 191 mg/dL — AB (ref 65–99)
GLUCOSE-CAPILLARY: 267 mg/dL — AB (ref 65–99)
Glucose-Capillary: 261 mg/dL — ABNORMAL HIGH (ref 65–99)
Glucose-Capillary: 259 mg/dL — ABNORMAL HIGH (ref 65–99)

## 2015-07-10 LAB — I-STAT CHEM 8, ED
BUN: 32 mg/dL — ABNORMAL HIGH (ref 6–20)
CHLORIDE: 95 mmol/L — AB (ref 101–111)
CREATININE: 2 mg/dL — AB (ref 0.44–1.00)
Calcium, Ion: 1.06 mmol/L — ABNORMAL LOW (ref 1.13–1.30)
Glucose, Bld: 262 mg/dL — ABNORMAL HIGH (ref 65–99)
HEMATOCRIT: 32 % — AB (ref 36.0–46.0)
HEMOGLOBIN: 10.9 g/dL — AB (ref 12.0–15.0)
POTASSIUM: 3.3 mmol/L — AB (ref 3.5–5.1)
Sodium: 136 mmol/L (ref 135–145)
TCO2: 25 mmol/L (ref 0–100)

## 2015-07-10 LAB — URINE MICROSCOPIC-ADD ON

## 2015-07-10 NOTE — ED Notes (Signed)
PTAR at bedside 

## 2015-07-10 NOTE — ED Notes (Signed)
Pt presents via EMS from [facility] [address] d/t hypoglycemia.  Pt was given 10 units of insulin at facility however it does not appear that staff at SNF did not check CBG prior to administration of insulin.  Pt's CBG for EMS was 149.  Staff had given pt food per EMS prior to their arrival.

## 2015-07-10 NOTE — ED Provider Notes (Signed)
CSN: [id number]     Arrival date & time 07/10/15  0056 History  By signing my name below, I, Taylor Padilla, attest that this documentation has been prepared under the direction and in the presence of Taylor Eimers, MD. Electronically Signed: Murriel Padilla, ED Scribe. 07/10/2015. 1:14 AM.    Chief Complaint  Patient presents with  . Hypoglycemia    LEVEL 5 CAVEAT due to dementia   Patient is a 69 y.o. female presenting with hypoglycemia. The history is provided by the EMS personnel. The history is limited by the condition of the patient.  Hypoglycemia Severity:  Moderate Onset quality:  Gradual Timing:  Unable to specify Progression:  Unable to specify Diabetic status:  Controlled with insulin Time since last antidiabetic medication: given insulin at nursing home without check of sugar before or after. Relieved by:  Nothing Ineffective treatments:  None tried Associated symptoms: no speech difficulty   Risk factors: no pregnancy    HPI Comments: Taylor Padilla is a 68 y.o. female who presents to the Emergency Department complaining of intermittent hypoglycemia that has been present for a few hours. Per EMS pt was brought from [facility] [address]  Pt was given 10 units of insulin at her facility, but her CBG was not checked prior to receiving insulin. CBG per EMS was 149.  Reportedly a sugar in the 50s prior to arrival now normal     Past Medical History  Diagnosis Date  . Hypertension   . CAD (coronary artery disease)   . 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 Macular edema     hx  . Glaucoma, both eyes   . High cholesterol   . CHF (congestive heart failure) (HCC)   . Chronic kidney disease (CKD), stage II (mild)     Taylor Padilla 10/07/2014  . Old myocardial infarct     "I was told I've had one; don't know when" (10/08/2014)  . Pneumonia 1990's X 1  . Hypothyroidism   . Type II diabetes mellitus (HCC)   . Anemia   . History of blood transfusion 2003    "related to OHS"  .  Stroke [facility])     "I've been told I've had one; I don't remember having any reaction from it at all"; denies residual on 10/08/2014   Past Surgical History  Procedure Laterality Date  . 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 Cataract extraction w/ intraocular lens  implant, bilateral Bilateral    Family History  Problem Relation Age of Onset  . CAD Neg Hx    Social History  Substance Use Topics  . Smoking status: Never Smoker   . Smokeless tobacco: Never Used  . Alcohol Use: No   OB History    No data available     Review of Systems  Unable to perform ROS: Dementia  Neurological: Negative for speech difficulty.      Allergies  Epinephrine  Home Medications   Prior to Admission medications   Medication Sig Start Date End Date Taking? Authorizing Provider  amLODipine (NORVASC) 10 MG tablet Take 10 mg by mouth daily. 09/27/14   Historical Provider, MD  ARIPiprazole (ABILIFY) 5 MG tablet Take 5 mg by mouth daily.    Historical Provider, MD  ARTIFICIAL TEAR SOLUTION OP Place 1 drop into the left eye 4 (four) times daily.    Historical Provider, MD  aspirin EC 81 MG EC tablet Take 1 tablet (81 mg total)  by mouth daily. 11/26/14   Renae Fickle, MD  atorvastatin (LIPITOR) 80 MG tablet Take 80 mg by mouth at bedtime. 09/21/14   Historical Provider, MD  cephALEXin (KEFLEX) 500 MG capsule Take 1 capsule (500 mg total) by mouth 2 (two) times daily. 05/31/15   Cheri Fowler, PA-C  clopidogrel (PLAVIX) 75 MG tablet Take 75 mg by mouth daily. 09/24/14   Historical Provider, MD  dorzolamide-timolol (COSOPT) 22.3-6.8 MG/ML ophthalmic solution Place 1 drop into both eyes every 12 (twelve) hours. 10/06/14   Historical Provider, MD  doxazosin (CARDURA) 4 MG tablet Take 1 tablet (4 mg total) by mouth daily. Patient not taking: Reported on 04/26/2015 11/26/14   Renae Fickle, MD  ferrous sulfate 325 (65 FE) MG tablet Take 1 tablet (325 mg total) by mouth 2 (two) times daily  with a meal. 03/24/15   Sharee Holster, NP  furosemide (LASIX) 40 MG tablet Take 40-80 mg by mouth daily. 80 mg every morning and 40 mg every evening    Historical Provider, MD  hydrALAZINE (APRESOLINE) 100 MG tablet Take 1 tablet (100 mg total) by mouth every 8 (eight) hours. 11/03/14   Joseph Art, DO  isosorbide dinitrate (ISORDIL) 10 MG tablet Take 2 tablets (20 mg total) by mouth 3 (three) times daily. 10/10/14   Jerald Kief, MD  latanoprost (XALATAN) 0.005 % ophthalmic solution Place 1 drop into the right eye at bedtime.    Historical Provider, MD  levothyroxine (SYNTHROID, LEVOTHROID) 100 MCG tablet Take 1 tablet (100 mcg total) by mouth daily before breakfast. 11/26/14   Renae Fickle, MD  Melatonin 3 MG TABS Take 3 mg by mouth at bedtime.    Historical Provider, MD  methocarbamol (ROBAXIN) 500 MG tablet Take 1 tablet (500 mg total) by mouth every 6 (six) hours as needed for muscle spasms. Patient not taking: Reported on 04/26/2015 03/04/15   Renae Fickle, MD  omeprazole (PRILOSEC) 20 MG capsule Take 20 mg by mouth daily.    Historical Provider, MD  oxyCODONE-acetaminophen (PERCOCET/ROXICET) 5-325 MG per tablet Take 1 tablet by mouth every 4 (four) hours as needed for severe pain. 03/04/15   Renae Fickle, MD  potassium chloride (K-DUR) 10 MEQ tablet Take 1 tablet (10 mEq total) by mouth daily. 06/01/15   Stevi Barrett, PA-C  prednisoLONE acetate (PRED FORTE) 1 % ophthalmic suspension Place 1 drop into the left eye 4 (four) times daily. 09/27/14   Historical Provider, MD  ranitidine (ZANTAC) 150 MG tablet Take 150 mg by mouth 2 (two) times daily as needed for heartburn.    Historical Provider, MD  sertraline (ZOLOFT) 100 MG tablet Take 200 mg by mouth at bedtime.    Historical Provider, MD  traMADol (ULTRAM) 50 MG tablet Take 1 tablet (50 mg total) by mouth every 6 (six) hours as needed for moderate pain. 03/24/15   Sharee Holster, NP   BP 133/60 mmHg  Pulse 64  Resp 16  SpO2  96% Physical Exam  Constitutional: She appears well-developed and well-nourished. No distress.  HENT:  Head: Normocephalic and atraumatic.  Mouth/Throat: Oropharynx is clear and moist.  Normal mucous membranes Uvula midline  Eyes: Conjunctivae are normal. Pupils are equal, round, and reactive to light.  Neck: Normal range of motion. Neck supple.  Cardiovascular: Normal rate, regular rhythm and intact distal pulses.   Murmur heard. Pulmonary/Chest: Effort normal and breath sounds normal. No respiratory distress. She has no wheezes. She has no rales.  2/6 systolic ejection  Abdominal:  Soft. Bowel sounds are normal. She exhibits no mass. There is no tenderness. There is no rebound and no guarding.  Musculoskeletal: Normal range of motion.  DP intact bilaterally  Neurological: She is alert. She has normal reflexes.  Skin: Skin is warm and dry.  Psychiatric: She has a normal mood and affect.    ED Course  Procedures (including critical care time)  DIAGNOSTIC STUDIES: Oxygen Saturation is 96% on room air, normal by my interpretation.    COORDINATION OF CARE: 1:22 AM Discussed treatment plan with pt at bedside and pt agreed to plan.   Labs Review Labs Reviewed  CBG MONITORING, ED - Abnormal; Notable for the following:    Glucose-Capillary 191 (*)    All other components within normal limits    Imaging Review No results found. I have personally reviewed and evaluated these images and lab results as part of my medical decision-making.   EKG Interpretation None      MDM   Final diagnoses:  None    Watched for several hours with labs and repeat sugar checks.  No hypoglycemia in the ED.  Stable for discharge at this time  I personally performed the services described in this documentation, which was scribed in my presence. The recorded information has been reviewed and is accurate.      Cy BlamerApril Lacorey Brusca, MD 07/10/15 [phone]

## 2015-07-10 NOTE — ED Notes (Signed)
Bed: ZO10WA15 Expected date:  Expected time:  Means of arrival:  Comments: EMS 23F SNF insulin OD

## 2015-07-12 ENCOUNTER — Telehealth (HOSPITAL_COMMUNITY): Payer: Self-pay

## 2015-07-12 ENCOUNTER — Emergency Department (HOSPITAL_COMMUNITY)
Admission: EM | Admit: 2015-07-12 | Discharge: 2015-07-13 | Disposition: A | Discharge status: Skilled Nursing Facility | Payer: Medicare PPO | Attending: Emergency Medicine | Admitting: Emergency Medicine

## 2015-07-12 ENCOUNTER — Encounter (HOSPITAL_COMMUNITY): Payer: Self-pay | Admitting: Emergency Medicine

## 2015-07-12 DIAGNOSIS — H540 Blindness, both eyes: Secondary | ICD-10-CM | POA: Insufficient documentation

## 2015-07-12 DIAGNOSIS — D649 Anemia, unspecified: Secondary | ICD-10-CM | POA: Insufficient documentation

## 2015-07-12 DIAGNOSIS — I129 Hypertensive chronic kidney disease with stage 1 through stage 4 chronic kidney disease, or unspecified chronic kidney disease: Secondary | ICD-10-CM | POA: Diagnosis not present

## 2015-07-12 DIAGNOSIS — Z8701 Personal history of pneumonia (recurrent): Secondary | ICD-10-CM | POA: Diagnosis not present

## 2015-07-12 DIAGNOSIS — R531 Weakness: Secondary | ICD-10-CM | POA: Diagnosis present

## 2015-07-12 DIAGNOSIS — E7800 Pure hypercholesterolemia, unspecified: Secondary | ICD-10-CM | POA: Insufficient documentation

## 2015-07-12 DIAGNOSIS — Z79899 Other long term (current) drug therapy: Secondary | ICD-10-CM | POA: Diagnosis not present

## 2015-07-12 DIAGNOSIS — Z8673 Personal history of transient ischemic attack (TIA), and cerebral infarction without residual deficits: Secondary | ICD-10-CM | POA: Insufficient documentation

## 2015-07-12 DIAGNOSIS — R61 Generalized hyperhidrosis: Secondary | ICD-10-CM | POA: Insufficient documentation

## 2015-07-12 DIAGNOSIS — E11649 Type 2 diabetes mellitus with hypoglycemia without coma: Secondary | ICD-10-CM | POA: Insufficient documentation

## 2015-07-12 DIAGNOSIS — Z7982 Long term (current) use of aspirin: Secondary | ICD-10-CM | POA: Insufficient documentation

## 2015-07-12 DIAGNOSIS — I509 Heart failure, unspecified: Secondary | ICD-10-CM | POA: Diagnosis not present

## 2015-07-12 DIAGNOSIS — I252 Old myocardial infarction: Secondary | ICD-10-CM | POA: Insufficient documentation

## 2015-07-12 DIAGNOSIS — Z951 Presence of aortocoronary bypass graft: Secondary | ICD-10-CM | POA: Insufficient documentation

## 2015-07-12 DIAGNOSIS — I2510 Atherosclerotic heart disease of native coronary artery without angina pectoris: Secondary | ICD-10-CM | POA: Diagnosis not present

## 2015-07-12 DIAGNOSIS — H409 Unspecified glaucoma: Secondary | ICD-10-CM | POA: Insufficient documentation

## 2015-07-12 DIAGNOSIS — N182 Chronic kidney disease, stage 2 (mild): Secondary | ICD-10-CM | POA: Diagnosis not present

## 2015-07-12 DIAGNOSIS — Z7901 Long term (current) use of anticoagulants: Secondary | ICD-10-CM | POA: Diagnosis not present

## 2015-07-12 DIAGNOSIS — E162 Hypoglycemia, unspecified: Secondary | ICD-10-CM

## 2015-07-12 DIAGNOSIS — Z794 Long term (current) use of insulin: Secondary | ICD-10-CM | POA: Diagnosis not present

## 2015-07-12 LAB — CBG MONITORING, ED: Glucose-Capillary: 177 mg/dL — ABNORMAL HIGH (ref 65–99)

## 2015-07-12 NOTE — Telephone Encounter (Signed)
[facility] called and reported patient had a weight gain: 11/04 137 11/07 141.2 Is taking Lasix 40 mg twice a day In ED for hypoglycemia over the weekend  and K+ was 3.3.  HHN states she is not on potasium supplements and not sure who writes her medication orders Has OV scheduled with Dr. Gala RomneyBensimhon 11/09

## 2015-07-12 NOTE — ED Provider Notes (Signed)
CSN: [id number]     Arrival date & time 07/12/15  2308 History  By signing my name below, I, Tanda Rockers, attest that this documentation has been prepared under the direction and in the presence of Zadie Rhine, MD. Electronically Signed: Tanda Rockers, ED Scribe. 07/12/2015. 11:41 PM.  Chief Complaint  Patient presents with  . Hypoglycemia  . Altered Mental Status   The history is provided by the patient. No language interpreter was used.     HPI Comments: Taylor Padilla is a 69 y.o. female brought in by ambulance, with hx DM who presents to the Emergency Department for hypoglycemia and unresponsiveness that occurred earlier tonight. Per triage report, pt was given insulin by her senior nursing facility staff at 8:30 PM Tonight (approximately 3 hours ago). At 10 PM tonight (1.5 hours ago), pt was found unresponsive, pale, and diaphoretic. EMS reported CBGs of 50 and 43 en route. She was given 1 mg Glucagon IM and 25 mg D50. Pt states she does not know what happened exactly but states she is feeling much better now. She is complaining of generalized weakness currently. Denies fever, nausea, vomiting, headache, chest pain, shortness of breath, abdominal pain, weakness or numbness in extremities, or any other associated symptoms.   Past Medical History  Diagnosis Date  . Hypertension   . CAD (coronary artery disease)   . 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 Macular edema     hx  . Glaucoma, both eyes   . High cholesterol   . CHF (congestive heart failure) (HCC)   . Chronic kidney disease (CKD), stage II (mild)     [name] 10/07/2014  . Old myocardial infarct     "I was told I've had one; don't know when" (10/08/2014)  . Pneumonia 1990's X 1  . Hypothyroidism   . Type II diabetes mellitus (HCC)   . Anemia   . History of blood transfusion 2003    "related to OHS"  . Stroke [facility])     "I've been told I've had one; I don't remember having any reaction from it at all"; denies residual on  10/08/2014   Past Surgical History  Procedure Laterality Date  . 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 Cataract extraction w/ intraocular lens  implant, bilateral Bilateral    Family History  Problem Relation Age of Onset  . CAD Neg Hx    Social History  Substance Use Topics  . Smoking status: Never Smoker   . Smokeless tobacco: Never Used  . Alcohol Use: No   OB History    No data available     Review of Systems  Constitutional: Positive for diaphoresis. Negative for fever.  Respiratory: Negative for shortness of breath.   Cardiovascular: Negative for chest pain.  Gastrointestinal: Negative for nausea and abdominal pain.  Neurological: Positive for weakness (Generalized). Negative for numbness and headaches.  All other systems reviewed and are negative.  Allergies  Epinephrine  Home Medications   Prior to Admission medications   Medication Sig Start Date End Date Taking? Authorizing Provider  ALPRAZolam (XANAX) 0.25 MG tablet Take 0.25 mg by mouth 3 (three) times daily as needed for anxiety.    Historical Provider, MD  amLODipine (NORVASC) 10 MG tablet Take 10 mg by mouth daily. 09/27/14   Historical Provider, MD  ARIPiprazole (ABILIFY) 5 MG tablet Take 5 mg by mouth daily.    Historical Provider, MD  ARTIFICIAL TEAR SOLUTION  OP Place 1 drop into the left eye 4 (four) times daily.    Historical Provider, MD  aspirin EC 81 MG EC tablet Take 1 tablet (81 mg total) by mouth daily. 11/26/14   Renae Fickle, MD  atorvastatin (LIPITOR) 80 MG tablet Take 80 mg by mouth at bedtime. 09/21/14   Historical Provider, MD  cephALEXin (KEFLEX) 500 MG capsule Take 1 capsule (500 mg total) by mouth 2 (two) times daily. Patient not taking: Reported on 07/10/2015 05/31/15   Cheri Fowler, PA-C  Cholecalciferol (VITAMIN D3) 5000 UNITS CAPS Take 5,000 Units by mouth daily.    Historical Provider, MD  cloNIDine (CATAPRES) 0.1 MG tablet Take 0.1 mg by mouth 2 (two) times  daily.  07/08/15   Historical Provider, MD  clopidogrel (PLAVIX) 75 MG tablet Take 75 mg by mouth daily. 09/24/14   Historical Provider, MD  donepezil (ARICEPT) 5 MG tablet Take 5 mg by mouth at bedtime.    Historical Provider, MD  dorzolamide-timolol (COSOPT) 22.3-6.8 MG/ML ophthalmic solution Place 1 drop into both eyes every 12 (twelve) hours. 10/06/14   Historical Provider, MD  doxazosin (CARDURA) 4 MG tablet Take 1 tablet (4 mg total) by mouth daily. Patient not taking: Reported on 04/26/2015 11/26/14   Renae Fickle, MD  ferrous sulfate 325 (65 FE) MG tablet Take 1 tablet (325 mg total) by mouth 2 (two) times daily with a meal. 03/24/15   Sharee Holster, NP  furosemide (LASIX) 40 MG tablet Take 40 mg by mouth 2 (two) times daily.     Historical Provider, MD  hydrALAZINE (APRESOLINE) 100 MG tablet Take 1 tablet (100 mg total) by mouth every 8 (eight) hours. 11/03/14   Joseph Art, DO  insulin aspart (NOVOLOG) 100 UNIT/ML injection Inject 10 Units into the skin 3 (three) times daily as needed for high blood sugar.    Historical Provider, MD  insulin glargine (LANTUS) 100 UNIT/ML injection Inject 30 Units into the skin at bedtime.    Historical Provider, MD  isosorbide dinitrate (ISORDIL) 10 MG tablet Take 2 tablets (20 mg total) by mouth 3 (three) times daily. 10/10/14   Jerald Kief, MD  latanoprost (XALATAN) 0.005 % ophthalmic solution Place 1 drop into the right eye at bedtime.    Historical Provider, MD  levothyroxine (SYNTHROID, LEVOTHROID) 100 MCG tablet Take 1 tablet (100 mcg total) by mouth daily before breakfast. Patient not taking: Reported on 07/10/2015 11/26/14   Renae Fickle, MD  Melatonin 3 MG TABS Take 3 mg by mouth at bedtime.    Historical Provider, MD  methocarbamol (ROBAXIN) 500 MG tablet Take 1 tablet (500 mg total) by mouth every 6 (six) hours as needed for muscle spasms. Patient not taking: Reported on 04/26/2015 03/04/15   Renae Fickle, MD  omeprazole (PRILOSEC) 20 MG  capsule Take 20 mg by mouth daily.    Historical Provider, MD  oxyCODONE-acetaminophen (PERCOCET/ROXICET) 5-325 MG per tablet Take 1 tablet by mouth every 4 (four) hours as needed for severe pain. 03/04/15   Renae Fickle, MD  potassium chloride (K-DUR) 10 MEQ tablet Take 1 tablet (10 mEq total) by mouth daily. 06/01/15   Stevi Barrett, PA-C  prednisoLONE acetate (PRED FORTE) 1 % ophthalmic suspension Place 1 drop into the left eye 4 (four) times daily. 09/27/14   Historical Provider, MD  ranitidine (ZANTAC) 150 MG tablet Take 150 mg by mouth 2 (two) times daily as needed for heartburn.    Historical Provider, MD  sertraline (ZOLOFT) 100 MG  tablet Take 200 mg by mouth at bedtime.    Historical Provider, MD  traMADol (ULTRAM) 50 MG tablet Take 1 tablet (50 mg total) by mouth every 6 (six) hours as needed for moderate pain. 03/24/15   Sharee Holstereborah S Green, NP   Triage Vitals: BP 175/62 mmHg  Pulse 49  Resp 22  SpO2 95%   Physical Exam  Nursing note and vitals reviewed.  CONSTITUTIONAL: Elderly, frail HEAD: Normocephalic/atraumatic ENMT: Mucous membranes moist NECK: supple no meningeal signs SPINE/BACK:entire spine nontender CV: S1/S2 noted, no murmurs/rubs/gallops noted LUNGS: Lungs are clear to auscultation bilaterally, no apparent distress ABDOMEN: soft, nontender, no rebound or guarding, bowel sounds noted throughout abdomen NEURO: Pt is awake/alert/appropriate, moves all extremitiesx4.  No facial droop.   EXTREMITIES: pulses normal/equal, full ROM SKIN: warm, color normal PSYCH: no abnormalities of mood noted, alert and oriented to situation  ED Course  Procedures   DIAGNOSTIC STUDIES: Oxygen Saturation is  95% on RA, adequate by my interpretation.    COORDINATION OF CARE: 11:38 PM-Discussed treatment plan which includes observation with pt at bedside and pt agreed to plan.  5:41 AM PT MONITORED FOR SEVERAL HOURS SHE WAS TAKING PO WITHOUT DIFFICULTY GLUCOSE IMPROVED  SHE IS AT  BASELINE WILL NEED STOP LANTUS AS SHE HAS RENAL INSUFFICIENCY SHE CAN CONTINUE NOVOLOG AND SLIDING SCALE WILL NEED REPEAT BMP TO MONITOR CREATININE   Labs Review Labs Reviewed  COMPREHENSIVE METABOLIC PANEL - Abnormal; Notable for the following:    Sodium 134 (*)    Chloride 96 (*)    Glucose, Bld 184 (*)    BUN 37 (*)    Creatinine, Ser 2.48 (*)    Calcium 8.6 (*)    Total Protein 6.0 (*)    Albumin 3.0 (*)    GFR calc non Af Amer 19 (*)    GFR calc Af Amer 22 (*)    All other components within normal limits  CBC - Abnormal; Notable for the following:    RBC 3.40 (*)    Hemoglobin 10.0 (*)    HCT 31.3 (*)    All other components within normal limits  CBG MONITORING, ED - Abnormal; Notable for the following:    Glucose-Capillary 177 (*)    All other components within normal limits  CBG MONITORING, ED - Abnormal; Notable for the following:    Glucose-Capillary 158 (*)    All other components within normal limits  CBG MONITORING, ED - Abnormal; Notable for the following:    Glucose-Capillary 133 (*)    All other components within normal limits  CBG MONITORING, ED  CBG MONITORING, ED  CBG MONITORING, ED  CBG MONITORING, ED  CBG MONITORING, ED  CBG MONITORING, ED    I have personally reviewed and evaluated these lab results as part of my medical decision-making.   EKG Interpretation   Date/Time:  Monday July 12 2015 23:25:25 EST Ventricular Rate:  49 PR Interval:  186 QRS Duration: 114 QT Interval:  544 QTC Calculation: 491 R Axis:   121 Text Interpretation:  Marked sinus bradycardia Borderline intraventricular  conduction delay Borderline prolonged QT interval artifact noted Confirmed  by Bebe ShaggyWICKLINE  MD, Dnya Hickle ([id number]) on 07/12/2015 11:32:42 PM      MDM   Final diagnoses:  Hypoglycemia    Nursing notes including past medical history and social history reviewed and considered in documentation Labs/vital reviewed myself and considered during  evaluation   I personally performed the services described in this documentation, which was scribed  in my presence. The recorded information has been reviewed and is accurate.       Zadie Rhine, MD 07/13/15 [phone]

## 2015-07-12 NOTE — ED Notes (Signed)
Family notified of patient here in ER per pt request

## 2015-07-12 NOTE — ED Notes (Signed)
Pt presents to ED via [facility] for unresponsiveness and hypoglycemia; pt was given insulin by SNF staff at 20:30 tonight and was found at 22:00 by staff to be unresponsive, pale, diaphoretic; EMS reports CBGs of 50 and 43 initially; 1mg  glucagon given IM; once IV est 25g D50% given; CBG currently 177; pt also presents with high BP; pt CAOx4 presently

## 2015-07-13 LAB — COMPREHENSIVE METABOLIC PANEL
ALBUMIN: 3 g/dL — AB (ref 3.5–5.0)
ALT: 16 U/L (ref 14–54)
ANION GAP: 8 (ref 5–15)
AST: 24 U/L (ref 15–41)
Alkaline Phosphatase: 75 U/L (ref 38–126)
BUN: 37 mg/dL — ABNORMAL HIGH (ref 6–20)
CHLORIDE: 96 mmol/L — AB (ref 101–111)
CO2: 30 mmol/L (ref 22–32)
Calcium: 8.6 mg/dL — ABNORMAL LOW (ref 8.9–10.3)
Creatinine, Ser: 2.48 mg/dL — ABNORMAL HIGH (ref 0.44–1.00)
GFR calc non Af Amer: 19 mL/min — ABNORMAL LOW (ref >60)
GFR, EST AFRICAN AMERICAN: 22 mL/min — AB (ref >60)
GLUCOSE: 184 mg/dL — AB (ref 65–99)
POTASSIUM: 4.1 mmol/L (ref 3.5–5.1)
SODIUM: 134 mmol/L — AB (ref 135–145)
Total Bilirubin: 0.3 mg/dL (ref 0.3–1.2)
Total Protein: 6 g/dL — ABNORMAL LOW (ref 6.5–8.1)

## 2015-07-13 LAB — CBC
HEMATOCRIT: 31.3 % — AB (ref 36.0–46.0)
HEMOGLOBIN: 10 g/dL — AB (ref 12.0–15.0)
MCH: 29.4 pg (ref 26.0–34.0)
MCHC: 31.9 g/dL (ref 30.0–36.0)
MCV: 92.1 fL (ref 78.0–100.0)
Platelets: 299 10*3/uL (ref 150–400)
RBC: 3.4 MIL/uL — AB (ref 3.87–5.11)
RDW: 14.4 % (ref 11.5–15.5)
WBC: 6.8 10*3/uL (ref 4.0–10.5)

## 2015-07-13 LAB — CBG MONITORING, ED
GLUCOSE-CAPILLARY: 73 mg/dL (ref 65–99)
GLUCOSE-CAPILLARY: 133 mg/dL — AB (ref 65–99)
Glucose-Capillary: 158 mg/dL — ABNORMAL HIGH (ref 65–99)
Glucose-Capillary: 86 mg/dL (ref 65–99)

## 2015-07-13 MED ORDER — SODIUM CHLORIDE 0.9 % IV BOLUS (SEPSIS)
500.0000 mL | Freq: Once | INTRAVENOUS | Status: AC
  Administered 2015-07-13: 500 mL via INTRAVENOUS

## 2015-07-13 NOTE — Discharge Instructions (Signed)
PLEASE STOP LANTUS YOU CAN CONTINUE NOVOLOG WITH SLIDING SCALE PLEASE CHECK BLOOD SUGAR EVERY 4 HOURS FOR 24 HOURS PLEASE HAVE LAB RECHECK (KIDNEY FUNCTION) IN 48 HOURS  Hypoglycemia Hypoglycemia occurs when the glucose in your blood is too low. Glucose is a type of sugar that is your body's main energy source. Hormones, such as insulin and glucagon, control the level of glucose in the blood. Insulin lowers blood glucose and glucagon increases blood glucose. Having too much insulin in your blood stream, or not eating enough food containing sugar, can result in hypoglycemia. Hypoglycemia can happen to people with or without diabetes. It can develop quickly and can be a medical emergency.  CAUSES   Missing or delaying meals.  Not eating enough carbohydrates at meals.  Taking too much diabetes medicine.  Not timing your oral diabetes medicine or insulin doses with meals, snacks, and exercise.  Nausea and vomiting.  Certain medicines.  Severe illnesses, such as hepatitis, kidney disorders, and certain eating disorders.  Increased activity or exercise without eating something extra or adjusting medicines.  Drinking too much alcohol.  A nerve disorder that affects body functions like your heart rate, blood pressure, and digestion (autonomic neuropathy).  A condition where the stomach muscles do not function properly (gastroparesis). Therefore, medicines and food may not absorb properly.  Rarely, a tumor of the pancreas can produce too much insulin. SYMPTOMS   Hunger.  Sweating (diaphoresis).  Change in body temperature.  Shakiness.  Headache.  Anxiety.  Lightheadedness.  Irritability.  Difficulty concentrating.  Dry mouth.  Tingling or numbness in the hands or feet.  Restless sleep or sleep disturbances.  Altered speech and coordination.  Change in mental status.  Seizures or prolonged convulsions.  Combativeness.  Drowsiness  (lethargic).  Weakness.  Increased heart rate or palpitations.  Confusion.  Pale, gray skin color.  Blurred or double vision.  Fainting. DIAGNOSIS  A physical exam and medical history will be performed. Your caregiver may make a diagnosis based on your symptoms. Blood tests and other lab tests may be performed to confirm a diagnosis. Once the diagnosis is made, your caregiver will see if your signs and symptoms go away once your blood glucose is raised.  TREATMENT  Usually, you can easily treat your hypoglycemia when you notice symptoms.  Check your blood glucose. If it is less than 70 mg/dl, take one of the following:   3-4 glucose tablets.    cup juice.    cup regular soda.   1 cup skim milk.   -1 tube of glucose gel.   5-6 hard candies.   Avoid high-fat drinks or food that may delay a rise in blood glucose levels.  Do not take more than the recommended amount of sugary foods, drinks, gel, or tablets. Doing so will cause your blood glucose to go too high.   Wait 10-15 minutes and recheck your blood glucose. If it is still less than 70 mg/dl or below your target range, repeat treatment.   Eat a snack if it is more than 1 hour until your next meal.  There may be a time when your blood glucose may go so low that you are unable to treat yourself at home when you start to notice symptoms. You may need someone to help you. You may even faint or be unable to swallow. If you cannot treat yourself, someone will need to bring you to the hospital.  HOME CARE INSTRUCTIONS  If you have diabetes, follow your diabetes  management plan by:  Taking your medicines as directed.  Following your exercise plan.  Following your meal plan. Do not skip meals. Eat on time.  Testing your blood glucose regularly. Check your blood glucose before and after exercise. If you exercise longer or different than usual, be sure to check blood glucose more frequently.  Wearing your  medical alert jewelry that says you have diabetes.  Identify the cause of your hypoglycemia. Then, develop ways to prevent the recurrence of hypoglycemia.  Do not take a hot bath or shower right after an insulin shot.  Always carry treatment with you. Glucose tablets are the easiest to carry.  If you are going to drink alcohol, drink it only with meals.  Tell friends or family members ways to keep you safe during a seizure. This may include removing hard or sharp objects from the area or turning you on your side.  Maintain a healthy weight. SEEK MEDICAL CARE IF:   You are having problems keeping your blood glucose in your target range.  You are having frequent episodes of hypoglycemia.  You feel you might be having side effects from your medicines.  You are not sure why your blood glucose is dropping so low.  You notice a change in vision or a new problem with your vision. SEEK IMMEDIATE MEDICAL CARE IF:   Confusion develops.  A change in mental status occurs.  The inability to swallow develops.  Fainting occurs.   This information is not intended to replace advice given to you by your health care provider. Make sure you discuss any questions you have with your health care provider.   Document Released: 08/21/2005 Document Revised: 08/26/2013 Document Reviewed: 04/27/2015 Elsevier Interactive Patient Education [employer]

## 2015-07-13 NOTE — ED Notes (Signed)
RN spoke with Taylor Padilla [facility] staff regarding events leading up to patients arrival; staff reports giving patient 4 units Novolog SQ and 30 units Lantus SQ at 20:30 on 07/12/2015; pt was found to be unresponsive and diaphoretic at 22:00- 911 was then called

## 2015-07-13 NOTE — ED Notes (Signed)
Pt drank 4 oz of orange juice without difficulty.

## 2015-07-13 NOTE — ED Notes (Addendum)
Orange juice, and apple sauce given to the patient.

## 2015-07-14 ENCOUNTER — Ambulatory Visit (HOSPITAL_COMMUNITY)
Admission: RE | Admit: 2015-07-14 | Discharge: 2015-07-14 | Disposition: A | Discharge status: Home / Self Care | Payer: Medicare PPO | Source: Ambulatory Visit | Attending: Internal Medicine | Admitting: Internal Medicine

## 2015-07-14 VITALS — BP 124/68 | HR 45 | Wt 147.8 lb

## 2015-07-14 DIAGNOSIS — N184 Chronic kidney disease, stage 4 (severe): Secondary | ICD-10-CM | POA: Diagnosis not present

## 2015-07-14 DIAGNOSIS — I130 Hypertensive heart and chronic kidney disease with heart failure and stage 1 through stage 4 chronic kidney disease, or unspecified chronic kidney disease: Secondary | ICD-10-CM | POA: Diagnosis not present

## 2015-07-14 DIAGNOSIS — E785 Hyperlipidemia, unspecified: Secondary | ICD-10-CM | POA: Diagnosis not present

## 2015-07-14 DIAGNOSIS — Z951 Presence of aortocoronary bypass graft: Secondary | ICD-10-CM | POA: Insufficient documentation

## 2015-07-14 DIAGNOSIS — R001 Bradycardia, unspecified: Secondary | ICD-10-CM | POA: Insufficient documentation

## 2015-07-14 DIAGNOSIS — I5032 Chronic diastolic (congestive) heart failure: Secondary | ICD-10-CM | POA: Insufficient documentation

## 2015-07-14 DIAGNOSIS — I2510 Atherosclerotic heart disease of native coronary artery without angina pectoris: Secondary | ICD-10-CM | POA: Insufficient documentation

## 2015-07-14 LAB — BASIC METABOLIC PANEL
Anion gap: 11 (ref 5–15)
BUN: 37 mg/dL — AB (ref 6–20)
CALCIUM: 8.6 mg/dL — AB (ref 8.9–10.3)
CHLORIDE: 96 mmol/L — AB (ref 101–111)
CO2: 27 mmol/L (ref 22–32)
Creatinine, Ser: 2.29 mg/dL — ABNORMAL HIGH (ref 0.44–1.00)
GFR calc non Af Amer: 20 mL/min — ABNORMAL LOW (ref >60)
GFR, EST AFRICAN AMERICAN: 24 mL/min — AB (ref >60)
GLUCOSE: 240 mg/dL — AB (ref 65–99)
Potassium: 4 mmol/L (ref 3.5–5.1)
Sodium: 134 mmol/L — ABNORMAL LOW (ref 135–145)

## 2015-07-14 LAB — BRAIN NATRIURETIC PEPTIDE: B Natriuretic Peptide: 1431.8 pg/mL — ABNORMAL HIGH (ref 0.0–100.0)

## 2015-07-14 MED ORDER — SPIRONOLACTONE 25 MG PO TABS: 12.5000 mg | ORAL_TABLET | Freq: Every day | ORAL | Status: DC

## 2015-07-14 NOTE — Patient Instructions (Signed)
Continue Lasix 40 mg twice a day If weight is greater than 141 pounds take an extra 40 mg of Lasix  Start Spironolactone 12.5 mg daily  Labs today will call if abnormal  Repeat labs in 1 week and in 3 weeks at facility please fax results to [phone]  Follow up in 6 weeks

## 2015-07-14 NOTE — Progress Notes (Signed)
REDS VEST READING= 28 CHEST RULER=14  VEST FITTING TASKS: POSTURE=sitting (in wheelchair) HEIGHT MARKER=S CENTER STRIP=aligned with spine  COMMENTS:none

## 2015-07-14 NOTE — Progress Notes (Signed)
[facility] Note   Primary HF: Dr Gala Romney  Patient ID: Taylor Padilla, female   DOB: 23-Nov-1945, 69 y.o.   MRN: [id number] 69 yo with history of CAD s/p CABG, chronic diastolic CHF, CKD, and cardiology followup after recent hospitalization.  She has had 3 recent admissions with acute on chronic diastolic CHF, most recently 11/19/14.  This last time, she was admitted and diuresed 20 lbs.  She had a fever and blood cultures showed MRSA.  No source was found and she was treated with a course of linezolid.    She returns for regular follow up after missing many of her scheduled appointments. She is at [facility]. She is legally blind. She has had admissions for multiple falls since her last visit. Has had trouble managing weight over past 2 weeks and has needed extra lasix. Weights at home 138-142. Was seen in [facility] 07/10/15 and 07/12/15 for hypoglycemia and altered LOC, has since stopped Lantus.   Labs (3/16): K 4, creatinine 2.69 Labs (12/07/2014) SPEP M Spike noted  Labs 07/12/15 K 4.1, Creatinine 2.48  PMH: 1. GERD 2. Hyperlipidemia 3. HTN: Renal artery dopplers 2/16 did not show significant renal artery stenosis.  4. Type II diabetes 5. Glaucoma: Legally blind. 6. CKD 7. Anemia 8. Hypothyroidism 9. CAD: CABG x 4 2003 in [place].  10. Chronic diastolic CHF: Echo (2/16) with EF 60-65%, restrictive diastolic function, moderate MR, severe LAE, mildly dilated RV with normal systolic function, PA systolic pressure 48 mmHg.  11. Sinus bradycardia 12. CVA  SH: Nonsmoker, widow, lives in assisted living in [place].   FH: No premature CAD.   ROS: All systems reviewed and negative except as per HPI.   Current Outpatient Prescriptions  Medication Sig Dispense Refill  . ALPRAZolam (XANAX) 0.25 MG tablet Take 0.25 mg by mouth 2 (two) times daily.     Marland Kitchen amLODipine (NORVASC) 10 MG tablet Take 10 mg by mouth daily.    . ARIPiprazole (ABILIFY) 5 MG tablet Take 5  mg by mouth daily.    Marland Kitchen aspirin EC 81 MG EC tablet Take 1 tablet (81 mg total) by mouth daily. 30 tablet 0  . atorvastatin (LIPITOR) 80 MG tablet Take 80 mg by mouth at bedtime.    . cloNIDine (CATAPRES) 0.1 MG tablet Take 0.1 mg by mouth 2 (two) times daily.     . clopidogrel (PLAVIX) 75 MG tablet Take 75 mg by mouth daily.    Marland Kitchen donepezil (ARICEPT) 5 MG tablet Take 5 mg by mouth at bedtime.    . dorzolamide-timolol (COSOPT) 22.3-6.8 MG/ML ophthalmic solution Place 1 drop into both eyes every 12 (twelve) hours.    . ferrous sulfate 325 (65 FE) MG tablet Take 1 tablet (325 mg total) by mouth 2 (two) times daily with a meal. 90 tablet 0  . furosemide (LASIX) 40 MG tablet Take 40 mg by mouth 2 (two) times daily.     . hydrALAZINE (APRESOLINE) 100 MG tablet Take 1 tablet (100 mg total) by mouth every 8 (eight) hours. 90 tablet 0  . insulin aspart (NOVOLOG) 100 UNIT/ML injection Inject 10 Units into the skin 3 (three) times daily as needed for high blood sugar.    . isosorbide dinitrate (ISORDIL) 10 MG tablet Take 2 tablets (20 mg total) by mouth 3 (three) times daily.    Marland Kitchen latanoprost (XALATAN) 0.005 % ophthalmic solution Place 1 drop into the right eye at bedtime.    Marland Kitchen levothyroxine (SYNTHROID, LEVOTHROID) 100  MCG tablet Take 1 tablet (100 mcg total) by mouth daily before breakfast. 30 tablet 0  . Melatonin 3 MG TABS Take 3 mg by mouth at bedtime.    Marland Kitchen. omeprazole (PRILOSEC) 20 MG capsule Take 20 mg by mouth daily.    Marland Kitchen. oxyCODONE-acetaminophen (PERCOCET/ROXICET) 5-325 MG per tablet Take 1 tablet by mouth every 4 (four) hours as needed for severe pain. 10 tablet 0  . polyvinyl alcohol (LIQUIFILM TEARS) 1.4 % ophthalmic solution Place 1 drop into the left eye 4 (four) times daily.    . prednisoLONE acetate (PRED FORTE) 1 % ophthalmic suspension Place 1 drop into the left eye 4 (four) times daily.    . ranitidine (ZANTAC) 150 MG tablet Take 150 mg by mouth 2 (two) times daily as needed for heartburn.      . sertraline (ZOLOFT) 100 MG tablet Take 200 mg by mouth at bedtime.    . traMADol (ULTRAM) 50 MG tablet Take 1 tablet (50 mg total) by mouth every 6 (six) hours as needed for moderate pain. 30 tablet 0  . Vitamin D, Ergocalciferol, (DRISDOL) 50000 UNITS CAPS capsule Take 50,000 Units by mouth See admin instructions. On the 3rd day of each month     No current facility-administered medications for this encounter.   BP 124/68 mmHg  Pulse 45  Wt 147 lb 12.8 oz (67.042 kg)  SpO2 93%    General: NAD, in WC HEENT: Normal except for blindness Neck: No JVD, no thyromegaly or lymphadenopathy  Lungs: CTA, normal effort.  CV: Nondisplaced PMI.  Heart regular S1/S2, no S3/S4, 2/6 HSM at apex.  No edema.  No carotid bruit.  Normal pedal pulses.  Abdomen: Soft, NT, ND, no HSM, no distention.  Skin: Intact without lesions or rashes.  Neurologic: Alert and oriented x 3.  Psych: Normal affect. Extremities: No clubbing or cyanosis.  HEENT: Normal.   Assessment/Plan: 1. Chronic diastolic CHF, NYHA class II-III. Echo 11/25/14 LVEF 55-60%, Grade 2 DD, moderate MR, RV moderately dilated, PA peak pressure 96 mm Hg - Continue Lasix 40 mg BID. Take extra 40 mg for weights 141 or greater  - Add 12.5 mg of spironolactone. - Minimal M-Spike on SPEP in past. Can recheck as needed.  - Continue weights daily at assisted living.  2. Sinus bradycardia: HR ranges 40-60s off nodal blockers. No need for PPM at this point.  3. CKD Stage IV:  Should have nephrology followup.  4. CAD: S/p CABG.  No chest pain episodes.  Continue ASA 81, Plavix 75, atorvastatin 80.   5. HTN: BP stable 6. Hyperlipidemia   BMET/BNP today.    Taylor FreerMichael Andrew Tillery  PA-C  07/14/2015  Patient seen and examined with Otilio SaberAndy Tillery, PA-C. We discussed all aspects of the encounter. I agree with the assessment and plan as stated above.   Volume status looks very good. VEST reading 28% confirms low lung water. Will add spironolactone to  help with fluctuations in volume status. Will also have her take extra 40 mg lasix for weight141 or greater. Will need to keep an eye on her bradycardia. No indications for PPM at this point. Check labs 1 and 3 weeks given spiro start.   Tova Vater,MD 6:26 PM

## 2015-07-15 ENCOUNTER — Telehealth: Payer: Self-pay | Admitting: Physician Assistant

## 2015-07-15 NOTE — Telephone Encounter (Signed)
I received a call from [place], the home health RN, the patient's weight increased from 141.2-146-148.4. The patient is noncompliant, drinking and eating whatever she feels like. She was started on Aldactone yesterday by Dr.Bensimhon who also prescribed an extra, when necessary-daily, 40 mg of Lasix.  Lyla SonCarrie gave this to the  patient today.  Patient is asymptomatic. No lower extremity edema.  Arlita Buffkin, PAC

## 2015-07-20 ENCOUNTER — Telehealth (HOSPITAL_COMMUNITY): Payer: Self-pay

## 2015-07-20 NOTE — Telephone Encounter (Signed)
Patients weight is at 150.2 and only has a trace of edema.  Do we need to have a different parameter for giving the extra dose of Lasix. No weight recorded for OV on 11/09 Order and plan on 11/9 :Continue Lasix 40 mg BID. Take extra 40 mg for weights 141 or greater   BMET is being done tomorrow.  Please advise

## 2015-07-26 ENCOUNTER — Telehealth (HOSPITAL_COMMUNITY): Payer: Self-pay

## 2015-07-26 NOTE — Telephone Encounter (Signed)
Calling for clarification of Lasix dose  Patients weight is at 150.2 and only has a trace of edema. Do we need to have a different parameter for giving the extra dose of Lasix. No weight recorded for OV on 11/09 Order and plan on 11/9 :Continue Lasix 40 mg BID. Take extra 40 mg for weights 141 or greater   [facility] has faxed over a request for clarification

## 2015-07-27 NOTE — Telephone Encounter (Signed)
[facility] [place] [facility] [phone]  From OV notes 11/09:  Take extra dose of 40 mg lasix for weight 141 lbs or greater  Weigh Daily  Faxed order to nursing care facility. HHN called and reported that the patient has not been getting her lasix as prescribed

## 2015-07-28 NOTE — Telephone Encounter (Signed)
Returned call to [employer].  LVMTCB

## 2015-07-30 ENCOUNTER — Emergency Department (HOSPITAL_COMMUNITY)
Admission: EM | Admit: 2015-07-30 | Discharge: 2015-07-30 | Disposition: A | Discharge status: Home / Self Care | Payer: Medicare PPO | Attending: Emergency Medicine | Admitting: Emergency Medicine

## 2015-07-30 ENCOUNTER — Encounter (HOSPITAL_COMMUNITY): Payer: Self-pay | Admitting: Emergency Medicine

## 2015-07-30 ENCOUNTER — Emergency Department (HOSPITAL_COMMUNITY): Payer: Medicare PPO

## 2015-07-30 DIAGNOSIS — Z7982 Long term (current) use of aspirin: Secondary | ICD-10-CM | POA: Diagnosis not present

## 2015-07-30 DIAGNOSIS — E039 Hypothyroidism, unspecified: Secondary | ICD-10-CM | POA: Insufficient documentation

## 2015-07-30 DIAGNOSIS — Z79899 Other long term (current) drug therapy: Secondary | ICD-10-CM | POA: Insufficient documentation

## 2015-07-30 DIAGNOSIS — I252 Old myocardial infarction: Secondary | ICD-10-CM | POA: Diagnosis not present

## 2015-07-30 DIAGNOSIS — S29001A Unspecified injury of muscle and tendon of front wall of thorax, initial encounter: Secondary | ICD-10-CM | POA: Diagnosis present

## 2015-07-30 DIAGNOSIS — Z8701 Personal history of pneumonia (recurrent): Secondary | ICD-10-CM | POA: Insufficient documentation

## 2015-07-30 DIAGNOSIS — I25110 Atherosclerotic heart disease of native coronary artery with unstable angina pectoris: Secondary | ICD-10-CM | POA: Diagnosis not present

## 2015-07-30 DIAGNOSIS — R52 Pain, unspecified: Secondary | ICD-10-CM

## 2015-07-30 DIAGNOSIS — D649 Anemia, unspecified: Secondary | ICD-10-CM | POA: Diagnosis not present

## 2015-07-30 DIAGNOSIS — H540 Blindness, both eyes: Secondary | ICD-10-CM | POA: Insufficient documentation

## 2015-07-30 DIAGNOSIS — I129 Hypertensive chronic kidney disease with stage 1 through stage 4 chronic kidney disease, or unspecified chronic kidney disease: Secondary | ICD-10-CM | POA: Diagnosis not present

## 2015-07-30 DIAGNOSIS — N182 Chronic kidney disease, stage 2 (mild): Secondary | ICD-10-CM | POA: Diagnosis not present

## 2015-07-30 DIAGNOSIS — Z8673 Personal history of transient ischemic attack (TIA), and cerebral infarction without residual deficits: Secondary | ICD-10-CM | POA: Insufficient documentation

## 2015-07-30 DIAGNOSIS — R0781 Pleurodynia: Secondary | ICD-10-CM

## 2015-07-30 DIAGNOSIS — E119 Type 2 diabetes mellitus without complications: Secondary | ICD-10-CM | POA: Insufficient documentation

## 2015-07-30 DIAGNOSIS — E782 Mixed hyperlipidemia: Secondary | ICD-10-CM | POA: Insufficient documentation

## 2015-07-30 DIAGNOSIS — Y998 Other external cause status: Secondary | ICD-10-CM | POA: Diagnosis not present

## 2015-07-30 DIAGNOSIS — Y9289 Other specified places as the place of occurrence of the external cause: Secondary | ICD-10-CM | POA: Insufficient documentation

## 2015-07-30 DIAGNOSIS — Z794 Long term (current) use of insulin: Secondary | ICD-10-CM | POA: Insufficient documentation

## 2015-07-30 DIAGNOSIS — I509 Heart failure, unspecified: Secondary | ICD-10-CM | POA: Diagnosis not present

## 2015-07-30 DIAGNOSIS — W06XXXA Fall from bed, initial encounter: Secondary | ICD-10-CM | POA: Diagnosis not present

## 2015-07-30 DIAGNOSIS — Y9389 Activity, other specified: Secondary | ICD-10-CM | POA: Insufficient documentation

## 2015-07-30 MED ORDER — TRAMADOL HCL 50 MG PO TABS
50.0000 mg | ORAL_TABLET | Freq: Once | ORAL | Status: AC
  Administered 2015-07-30: 50 mg via ORAL
  Filled 2015-07-30: qty 1

## 2015-07-30 NOTE — ED Notes (Signed)
Pt transported to xray 

## 2015-07-30 NOTE — ED Provider Notes (Signed)
CSN: [id number]     Arrival date & time 07/30/15  1220 History   First MD Initiated Contact with Patient 07/30/15 1223     Chief Complaint  Patient presents with  . Chest Pain     (Consider location/radiation/quality/duration/timing/severity/associated sxs/prior Treatment) HPI Taylor Padilla is a 69 y.o. female who is blind in both eyes, from [facility] living facility, comes in for evaluation of right rib pain. Patient states presently 2 days ago she fell over the side of her bed and leaned on a rail on her right ribs. She reports since that time she has had worsening right rib pain. This discomfort is worse when she breathes deeply. Nothing seems to make it better. She denies any other chest pain, shortness of breath, leg swelling, travel or surgeries, nausea or vomiting, abdominal pain, numbness or weakness, rash. She reports only mild discomfort now in the ED.  Past Medical History  Diagnosis Date  . Hypertension   . CAD (coronary artery disease)   . 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 Macular edema     hx  . Glaucoma, both eyes   . High cholesterol   . CHF (congestive heart failure) (HCC)   . Chronic kidney disease (CKD), stage II (mild)     [name] 10/07/2014  . Old myocardial infarct     "I was told I've had one; don't know when" (10/08/2014)  . Pneumonia 1990's X 1  . Hypothyroidism   . Type II diabetes mellitus (HCC)   . Anemia   . History of blood transfusion 2003    "related to [facility]"  . Stroke [facility])     "I've been told I've had one; I don't remember having any reaction from it at all"; denies residual on 10/08/2014   Past Surgical History  Procedure Laterality Date  . Coronary artery bypass graft  01/2002    "CABG X4" at [facility]  . Cataract extraction w/ intraocular lens  implant, bilateral Bilateral    Family History  Problem Relation Age of Onset  . CAD Neg Hx    Social History  Substance Use Topics  . Smoking status: Never Smoker   . Smokeless tobacco: Never  Used  . Alcohol Use: No   OB History    No data available     Review of Systems A 10 point review of systems was completed and was negative except for pertinent positives and negatives as mentioned in the history of present illness     Allergies  Epinephrine  Home Medications   Prior to Admission medications   Medication Sig Start Date End Date Taking? Authorizing Provider  ALPRAZolam (XANAX) 0.25 MG tablet Take 0.25 mg by mouth 2 (two) times daily.    Yes Historical Provider, MD  amLODipine (NORVASC) 10 MG tablet Take 10 mg by mouth daily. 09/27/14  Yes Historical Provider, MD  ARIPiprazole (ABILIFY) 5 MG tablet Take 5 mg by mouth daily.   Yes Historical Provider, MD  aspirin EC 81 MG EC tablet Take 1 tablet (81 mg total) by mouth daily. 11/26/14  Yes Renae Fickle, MD  atorvastatin (LIPITOR) 80 MG tablet Take 80 mg by mouth at bedtime. 09/21/14  Yes Historical Provider, MD  cloNIDine (CATAPRES) 0.1 MG tablet Take 0.1 mg by mouth 2 (two) times daily.  07/08/15  Yes Historical Provider, MD  clopidogrel (PLAVIX) 75 MG tablet Take 75 mg by mouth daily. 09/24/14  Yes Historical Provider, MD  dorzolamide-timolol (COSOPT) 22.3-6.8 MG/ML ophthalmic solution Place 1  drop into both eyes every 12 (twelve) hours. 10/06/14  Yes Historical Provider, MD  ferrous sulfate 325 (65 FE) MG tablet Take 1 tablet (325 mg total) by mouth 2 (two) times daily with a meal. 03/24/15  Yes Sharee Holster, NP  furosemide (LASIX) 40 MG tablet Take 40 mg by mouth 2 (two) times daily. If weight is greater than 141 lbs please take an extra 40 mg of Lasix   Yes Historical Provider, MD  hydrALAZINE (APRESOLINE) 100 MG tablet Take 1 tablet (100 mg total) by mouth every 8 (eight) hours. 11/03/14  Yes Joseph Art, DO  insulin aspart (NOVOLOG) 100 UNIT/ML injection Inject 0-12 Units into the skin 3 (three) times daily as needed for high blood sugar.    Yes Historical Provider, MD  insulin glargine (LANTUS) 100 UNIT/ML injection  Inject 11 Units into the skin at bedtime.   Yes Historical Provider, MD  isosorbide dinitrate (ISORDIL) 10 MG tablet Take 2 tablets (20 mg total) by mouth 3 (three) times daily. Patient taking differently: Take 10 mg by mouth 3 (three) times daily.  10/10/14  Yes Jerald Kief, MD  latanoprost (XALATAN) 0.005 % ophthalmic solution Place 1 drop into the right eye at bedtime.   Yes Historical Provider, MD  levothyroxine (SYNTHROID, LEVOTHROID) 100 MCG tablet Take 1 tablet (100 mcg total) by mouth daily before breakfast. 11/26/14  Yes Renae Fickle, MD  Melatonin 3 MG TABS Take 3 mg by mouth at bedtime.   Yes Historical Provider, MD  omeprazole (PRILOSEC) 20 MG capsule Take 20 mg by mouth daily.   Yes Historical Provider, MD  oxyCODONE-acetaminophen (PERCOCET/ROXICET) 5-325 MG per tablet Take 1 tablet by mouth every 4 (four) hours as needed for severe pain. 03/04/15  Yes Renae Fickle, MD  polyvinyl alcohol (LIQUIFILM TEARS) 1.4 % ophthalmic solution Place 1 drop into the left eye 4 (four) times daily.   Yes Historical Provider, MD  prednisoLONE acetate (PRED FORTE) 1 % ophthalmic suspension Place 1 drop into the left eye 4 (four) times daily. 09/27/14  Yes Historical Provider, MD  sertraline (ZOLOFT) 100 MG tablet Take 200 mg by mouth at bedtime.   Yes Historical Provider, MD  spironolactone (ALDACTONE) 25 MG tablet Take 0.5 tablets (12.5 mg total) by mouth daily. 07/14/15  Yes Dolores Patty, MD  traMADol (ULTRAM) 50 MG tablet Take 1 tablet (50 mg total) by mouth every 6 (six) hours as needed for moderate pain. 03/24/15  Yes Sharee Holster, NP  Vitamin D, Ergocalciferol, (DRISDOL) 50000 UNITS CAPS capsule Take 50,000 Units by mouth See admin instructions. On the 3rd day of each month   Yes Historical Provider, MD   BP 165/62 mmHg  Pulse 50  Temp(Src) 98 F (36.7 C) (Oral)  Resp 20  SpO2 91% Physical Exam  Constitutional: She is oriented to person, place, and time. She appears well-developed  and well-nourished.  HENT:  Head: Normocephalic and atraumatic.  Mouth/Throat: Oropharynx is clear and moist.  Eyes: Conjunctivae are normal. Pupils are equal, round, and reactive to light. Right eye exhibits no discharge. Left eye exhibits no discharge. No scleral icterus.  Neck: Neck supple.  Cardiovascular: Normal rate, regular rhythm and normal heart sounds.   Pulmonary/Chest: Effort normal and breath sounds normal. No respiratory distress. She has no wheezes. She has no rales.  Examination of right chest wall negative for any bony crepitus or lesions. She does have diffuse tenderness throughout right axillary line with very mild palpation. No rash. Female  chaperone present for entirety of exam.  Abdominal: Soft. There is no tenderness.  Musculoskeletal: Normal range of motion. She exhibits no tenderness.  Full range of motion of right shoulder  Neurological: She is alert and oriented to person, place, and time.  Cranial Nerves II-XII grossly intact  Skin: Skin is warm and dry. No rash noted.  Psychiatric: She has a normal mood and affect.  Nursing note and vitals reviewed.   ED Course  Procedures (including critical care time) Labs Review Labs Reviewed - No data to display  Imaging Review Dg Chest 2 View  07/30/2015  CLINICAL DATA:  Shortness of breath after fall today. EXAM: CHEST  2 VIEW COMPARISON:  April 26, 2015. FINDINGS: The heart size and mediastinal contours are within normal limits. Both lungs are clear. Status post coronary artery bypass graft. No pneumothorax or pleural effusion is noted. The visualized skeletal structures are unremarkable. IMPRESSION: No active cardiopulmonary disease. Electronically Signed   By: Lupita RaiderJames  Green Jr, M.D.   On: 07/30/2015 13:40   Dg Ribs Unilateral Right  07/30/2015  CLINICAL DATA:  Pt fell today and injured her lower right side - having right rib pain now with SOB (mostly due to pain when taking in breath) EXAM: RIGHT RIBS - 2 VIEW  COMPARISON:  Chest radiograph 11/20/2014 FINDINGS: Dedicated views of the RIGHT ribs demonstrate no displaced fracture. No pneumothorax. Rounded density at the medial RIGHT lung apex persistent on multiple comparison exams IMPRESSION: No RIGHT rib fracture or pneumothorax. Stable rounded density in the RIGHT lung apex over comparison exams back to 10/07/2014. Finding likely represents a vascular shadow or potential thyroid goiter. Consider CT thorax for confirmation. Electronically Signed   By: Genevive BiStewart  Edmunds M.D.   On: 07/30/2015 13:44   I have personally reviewed and evaluated these images and lab results as part of my medical decision-making.   EKG Interpretation   Date/Time:  Friday July 30 2015 12:27:20 EST Ventricular Rate:  46 PR Interval:  188 QRS Duration: 105 QT Interval:  525 QTC Calculation: 459 R Axis:   -24 Text Interpretation:  Sinus bradycardia Left ventricular hypertrophy  Anteroseptal infarct, age indeterminate No significant change since last  tracing Confirmed by [facility] Community HospitalCHLOSSMAN MD, ERIN ([id number]) on 07/30/2015 1:55:07 PM     Meds given in ED:  Medications  traMADol (ULTRAM) tablet 50 mg (50 mg Oral Given 07/30/15 1253)    New Prescriptions   No medications on file   Filed Vitals:   07/30/15 1445 07/30/15 1500 07/30/15 1515 07/30/15 1530  BP: 167/61 164/73 162/66 165/62  Pulse: 51 48 50 50  Temp:    98 F (36.7 C)  TempSrc:    Oral  Resp: 16 14 16 20   SpO2: 94% 95% 95% 91%    MDM  Valentino HueDorcas Labrosse is a 69 y.o. female who comes in from [facility] facility for evaluation of right rib pain. Patient reports her ribs started hurting after she was leaning on a rail after she fell 2 days ago. She denies any other chest pain, shortness of breath, numbness or weakness, cough or other cardiopulmonary symptoms. She is diffusely tender on exam but no bony crepitus or rash or other evidence of zoster. Plain films of right ribs and chest x-ray are negative. Pain is  improved with tramadol in the ED. Patient overall appears well, nontoxic, hemodynamically stable and appropriate for discharge back to her living facility. Encouraged continued use of home pain medicines. Follow-up with her living facility physician in  the next couple of days for reevaluation. No evidence of other acute or emergent pathology at this time. Given strict return precautions and patient verbalizes understanding and agrees with this plan. Prior to patient discharge, I discussed and reviewed this case with Dr.Schlossman   Final diagnoses:  Rib pain on right side     Joycie Peek, PA-C 07/30/15 1609  Alvira Monday, MD 07/30/15 2027

## 2015-07-30 NOTE — Discharge Instructions (Signed)
There does not appear to be an emergent cause for your symptoms at this time. Your x-ray showed no broken bones. Please continue to take your home pain medicine for your discomfort. Follow up with her doctor in the next 2-3 days for reevaluation as needed. Return to ED for any new or worsening symptoms.  Chest Wall Pain Chest wall pain is pain in or around the bones and muscles of your chest. Sometimes, an injury causes this pain. Sometimes, the cause may not be known. This pain may take several weeks or longer to get better. HOME CARE INSTRUCTIONS  Pay attention to any changes in your symptoms. Take these actions to help with your pain:   Rest as told by your health care provider.   Avoid activities that cause pain. These include any activities that use your chest muscles or your abdominal and side muscles to lift heavy items.   If directed, apply ice to the painful area:  Put ice in a plastic bag.  Place a towel between your skin and the bag.  Leave the ice on for 20 minutes, 2-3 times per day.  Take over-the-counter and prescription medicines only as told by your health care provider.  Do not use tobacco products, including cigarettes, chewing tobacco, and e-cigarettes. If you need help quitting, ask your health care provider.  Keep all follow-up visits as told by your health care provider. This is important. SEEK MEDICAL CARE IF:  You have a fever.  Your chest pain becomes worse.  You have new symptoms. SEEK IMMEDIATE MEDICAL CARE IF:  You have nausea or vomiting.  You feel sweaty or light-headed.  You have a cough with phlegm (sputum) or you cough up blood.  You develop shortness of breath.   This information is not intended to replace advice given to you by your health care provider. Make sure you discuss any questions you have with your health care provider.   Document Released: 08/21/2005 Document Revised: 05/12/2015 Document Reviewed: 11/16/2014 Elsevier  Interactive Patient Education [employer]2016 Elsevier Inc.

## 2015-07-30 NOTE — ED Notes (Signed)
PTAR called @1527 

## 2015-07-30 NOTE — ED Notes (Signed)
Per [facility], pt from [facility] [address]. Pt is totally blind. Pt states she leaned against her bed rail a few days ago on the R side of her ribs, and ever since its been very sore, pt c/o R rib pain and tenderness. Pt also tripped and fell today using her walker, no injuries from that, pt denies LOC. Pt AAOX4, in NAD. HR 44, hx of MI, pt is not on any beta blockers, pt had mild ST elevation on ems leads, per MD who read report, this is patients baseline.

## 2015-07-30 NOTE — ED Notes (Signed)
Called Child psychotherapistocial Worker for family. Family didn't state exactly what they needed. Stated that in the voicemail that I left for Child psychotherapistocial Worker.

## 2015-07-30 NOTE — ED Notes (Signed)
Pt requesting social work be called. Secretary made aware.

## 2015-08-24 ENCOUNTER — Emergency Department (HOSPITAL_COMMUNITY)
Admission: EM | Admit: 2015-08-24 | Discharge: 2015-08-24 | Disposition: A | Discharge status: Home / Self Care | Payer: Medicare PPO | Attending: Emergency Medicine | Admitting: Emergency Medicine

## 2015-08-24 ENCOUNTER — Encounter (HOSPITAL_COMMUNITY): Payer: Self-pay | Admitting: Emergency Medicine

## 2015-08-24 DIAGNOSIS — E039 Hypothyroidism, unspecified: Secondary | ICD-10-CM | POA: Insufficient documentation

## 2015-08-24 DIAGNOSIS — E11649 Type 2 diabetes mellitus with hypoglycemia without coma: Secondary | ICD-10-CM | POA: Diagnosis present

## 2015-08-24 DIAGNOSIS — H540 Blindness, both eyes: Secondary | ICD-10-CM | POA: Insufficient documentation

## 2015-08-24 DIAGNOSIS — Z7902 Long term (current) use of antithrombotics/antiplatelets: Secondary | ICD-10-CM | POA: Diagnosis not present

## 2015-08-24 DIAGNOSIS — I129 Hypertensive chronic kidney disease with stage 1 through stage 4 chronic kidney disease, or unspecified chronic kidney disease: Secondary | ICD-10-CM | POA: Diagnosis not present

## 2015-08-24 DIAGNOSIS — Z7982 Long term (current) use of aspirin: Secondary | ICD-10-CM | POA: Diagnosis not present

## 2015-08-24 DIAGNOSIS — Z7952 Long term (current) use of systemic steroids: Secondary | ICD-10-CM | POA: Insufficient documentation

## 2015-08-24 DIAGNOSIS — Z8673 Personal history of transient ischemic attack (TIA), and cerebral infarction without residual deficits: Secondary | ICD-10-CM | POA: Diagnosis not present

## 2015-08-24 DIAGNOSIS — E785 Hyperlipidemia, unspecified: Secondary | ICD-10-CM | POA: Diagnosis not present

## 2015-08-24 DIAGNOSIS — E162 Hypoglycemia, unspecified: Secondary | ICD-10-CM

## 2015-08-24 DIAGNOSIS — Z8701 Personal history of pneumonia (recurrent): Secondary | ICD-10-CM | POA: Insufficient documentation

## 2015-08-24 DIAGNOSIS — D649 Anemia, unspecified: Secondary | ICD-10-CM | POA: Diagnosis not present

## 2015-08-24 DIAGNOSIS — N182 Chronic kidney disease, stage 2 (mild): Secondary | ICD-10-CM | POA: Insufficient documentation

## 2015-08-24 DIAGNOSIS — I252 Old myocardial infarction: Secondary | ICD-10-CM | POA: Insufficient documentation

## 2015-08-24 DIAGNOSIS — H409 Unspecified glaucoma: Secondary | ICD-10-CM | POA: Diagnosis not present

## 2015-08-24 DIAGNOSIS — Z794 Long term (current) use of insulin: Secondary | ICD-10-CM | POA: Diagnosis not present

## 2015-08-24 DIAGNOSIS — R011 Cardiac murmur, unspecified: Secondary | ICD-10-CM | POA: Insufficient documentation

## 2015-08-24 DIAGNOSIS — I509 Heart failure, unspecified: Secondary | ICD-10-CM | POA: Insufficient documentation

## 2015-08-24 DIAGNOSIS — Z79899 Other long term (current) drug therapy: Secondary | ICD-10-CM | POA: Insufficient documentation

## 2015-08-24 DIAGNOSIS — I2510 Atherosclerotic heart disease of native coronary artery without angina pectoris: Secondary | ICD-10-CM | POA: Insufficient documentation

## 2015-08-24 DIAGNOSIS — Z951 Presence of aortocoronary bypass graft: Secondary | ICD-10-CM | POA: Diagnosis not present

## 2015-08-24 LAB — CBG MONITORING, ED
GLUCOSE-CAPILLARY: 106 mg/dL — AB (ref 65–99)
GLUCOSE-CAPILLARY: 77 mg/dL (ref 65–99)

## 2015-08-24 NOTE — ED Notes (Signed)
Patient waiting on PTAR. 

## 2015-08-24 NOTE — ED Notes (Signed)
Bed: WA09 Expected date:  Expected time:  Means of arrival:  Comments: EMS 69 yo female from SNF, hypoglycemia, D50

## 2015-08-24 NOTE — ED Provider Notes (Signed)
CSN: [id number]     Arrival date & time 08/24/15  0030 History  By signing my name below, I, Octavia Heirrianna Nassar, attest that this documentation has been prepared under the direction and in the presence of General MillsBenjamin Nadalee Neiswender, PA-C. Electronically Signed: Octavia HeirArianna Nassar, ED Scribe. 08/24/2015. 1:03 AM.    Chief Complaint  Patient presents with  . Hypoglycemia      The history is provided by the patient and the EMS personnel. No language interpreter was used.   HPI Comments: Taylor Padilla is a 69 y.o. female from nursing facility who has a hx of HTN presents to the Emergency Department complaining of acute onset hypoglycemia onset this evening. Pt reports she was sleeping and had a dream she was with her husband who passed away and next thing she remembers she was floating. Pt was told her blood sugar dropped to 47. She reports she still does not feel well and that she feels very weak. EMS gave her D50 and her blood sugar was raised to 56. Pt denies chest pain, nausea, vomiting, diarrhea, abdominal pain, shortness of breath, numbness.  Past Medical History  Diagnosis Date  . Hypertension   . CAD (coronary artery disease)   . 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 Macular edema     hx  . Glaucoma, both eyes   . High cholesterol   . CHF (congestive heart failure) (HCC)   . Chronic kidney disease (CKD), stage II (mild)     [name]/notes 10/07/2014  . Old myocardial infarct     "I was told I've had one; don't know when" (10/08/2014)  . Pneumonia 1990's X 1  . Hypothyroidism   . Type II diabetes mellitus (HCC)   . Anemia   . History of blood transfusion 2003    "related to OHS"  . Stroke [facility](HCC)     "I've been told I've had one; I don't remember having any reaction from it at all"; denies residual on 10/08/2014   Past Surgical History  Procedure Laterality Date  . 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 Cataract extraction w/ intraocular lens  implant, bilateral Bilateral     Family History  Problem Relation Age of Onset  . CAD Neg Hx    Social History  Substance Use Topics  . Smoking status: Never Smoker   . Smokeless tobacco: Never Used  . Alcohol Use: No   OB History    No data available     Review of Systems  Respiratory: Negative for shortness of breath.   Cardiovascular: Negative for chest pain.  Gastrointestinal: Negative for nausea, vomiting and diarrhea.  Neurological: Positive for weakness.  All other systems reviewed and are negative.     Allergies  Epinephrine  Home Medications   Prior to Admission medications   Medication Sig Start Date End Date Taking? Authorizing Provider  traMADol (ULTRAM) 50 MG tablet Take 1 tablet (50 mg total) by mouth every 6 (six) hours as needed for moderate pain. 03/24/15  Yes Sharee Holstereborah S Green, NP  ALPRAZolam Prudy Feeler(XANAX) 0.25 MG tablet Take 0.25 mg by mouth 2 (two) times daily.     Historical Provider, MD  amLODipine (NORVASC) 10 MG tablet Take 10 mg by mouth daily. 09/27/14   Historical Provider, MD  ARIPiprazole (ABILIFY) 5 MG tablet Take 5 mg by mouth daily.    Historical Provider, MD  aspirin EC 81 MG EC tablet Take 1 tablet (81 mg total) by  mouth daily. 11/26/14   Renae Fickle, MD  atorvastatin (LIPITOR) 80 MG tablet Take 80 mg by mouth at bedtime. 09/21/14   Historical Provider, MD  cloNIDine (CATAPRES) 0.1 MG tablet Take 0.1 mg by mouth 2 (two) times daily.  07/08/15   Historical Provider, MD  clopidogrel (PLAVIX) 75 MG tablet Take 75 mg by mouth daily. 09/24/14   Historical Provider, MD  dorzolamide-timolol (COSOPT) 22.3-6.8 MG/ML ophthalmic solution Place 1 drop into both eyes every 12 (twelve) hours. 10/06/14   Historical Provider, MD  ferrous sulfate 325 (65 FE) MG tablet Take 1 tablet (325 mg total) by mouth 2 (two) times daily with a meal. 03/24/15   Sharee Holster, NP  furosemide (LASIX) 40 MG tablet Take 40 mg by mouth 2 (two) times daily. If weight is greater than 141 lbs please take an extra 40 mg  of Lasix    Historical Provider, MD  hydrALAZINE (APRESOLINE) 100 MG tablet Take 1 tablet (100 mg total) by mouth every 8 (eight) hours. 11/03/14   Joseph Art, DO  insulin aspart (NOVOLOG) 100 UNIT/ML injection Inject 0-12 Units into the skin 3 (three) times daily as needed for high blood sugar.     Historical Provider, MD  insulin glargine (LANTUS) 100 UNIT/ML injection Inject 11 Units into the skin at bedtime.    Historical Provider, MD  isosorbide dinitrate (ISORDIL) 10 MG tablet Take 2 tablets (20 mg total) by mouth 3 (three) times daily. Patient taking differently: Take 10 mg by mouth 3 (three) times daily.  10/10/14   Jerald Kief, MD  latanoprost (XALATAN) 0.005 % ophthalmic solution Place 1 drop into the right eye at bedtime.    Historical Provider, MD  levothyroxine (SYNTHROID, LEVOTHROID) 100 MCG tablet Take 1 tablet (100 mcg total) by mouth daily before breakfast. 11/26/14   Renae Fickle, MD  Melatonin 3 MG TABS Take 3 mg by mouth at bedtime.    Historical Provider, MD  omeprazole (PRILOSEC) 20 MG capsule Take 20 mg by mouth daily.    Historical Provider, MD  oxyCODONE-acetaminophen (PERCOCET/ROXICET) 5-325 MG per tablet Take 1 tablet by mouth every 4 (four) hours as needed for severe pain. 03/04/15   Renae Fickle, MD  polyvinyl alcohol (LIQUIFILM TEARS) 1.4 % ophthalmic solution Place 1 drop into the left eye 4 (four) times daily.    Historical Provider, MD  prednisoLONE acetate (PRED FORTE) 1 % ophthalmic suspension Place 1 drop into the left eye 4 (four) times daily. 09/27/14   Historical Provider, MD  sertraline (ZOLOFT) 100 MG tablet Take 200 mg by mouth at bedtime.    Historical Provider, MD  spironolactone (ALDACTONE) 25 MG tablet Take 0.5 tablets (12.5 mg total) by mouth daily. 07/14/15   Dolores Patty, MD  Vitamin D, Ergocalciferol, (DRISDOL) 50000 UNITS CAPS capsule Take 50,000 Units by mouth See admin instructions. On the 3rd day of each month    Historical Provider, MD    Triage vitals: BP 162/69 mmHg  Pulse 50  Temp(Src) 97.5 F (36.4 C) (Oral)  Resp 18  SpO2 99% Physical Exam  Constitutional: She is oriented to person, place, and time. She appears well-developed and well-nourished. No distress.  HENT:  Head: Normocephalic and atraumatic.  Eyes: EOM are normal.  Neck: Normal range of motion.  Cardiovascular: Normal rate.   Murmur heard. Systolic ejection murmur  Pulmonary/Chest: Effort normal and breath sounds normal.  Abdominal: Soft. She exhibits no distension. There is no tenderness.  Musculoskeletal: Normal range of  motion.  Neurological: She is alert and oriented to person, place, and time.  Skin: Skin is warm and dry.  Psychiatric: She has a normal mood and affect. Judgment normal.  Nursing note and vitals reviewed.   ED Course  Procedures  DIAGNOSTIC STUDIES: Oxygen Saturation is 99% on RA, normal by my interpretation.  COORDINATION OF CARE:  1:03 AM Discussed treatment plan with pt at bedside and pt agreed to plan.  Labs Review Labs Reviewed  CBG MONITORING, ED  CBG MONITORING, ED  CBG MONITORING, ED    Imaging Review No results found. I have personally reviewed and evaluated these images and lab results as part of my medical decision-making.   EKG Interpretation None     Meds given in ED:  Medications - No data to display  Discharge Medication List as of 08/24/2015  4:07 AM     Filed Vitals:   08/24/15 0033 08/24/15 0316  BP: 162/69 207/90  Pulse: 50 60  Temp: 97.5 F (36.4 C)   TempSrc: Oral   Resp: 18 16  SpO2: 99% 98%    MDM  Taylor Padilla is a 69 y.o. female from living facility, legally blind, multiple ED visits for hypoglycemia comes in for evaluation of acute hypoglycemia. Per nursing report, Patient was administered insulin earlier this evening, patient became more lethargic and diaphoretic and only semi-responsive. EMS was called and blood sugar was noted to be 47. Patient received 1 amp of D50  in route. She has been drinking soda in the ED, acting appropriately and states she feels much better. Most recent CBG 77. Overall, she appears well, hemodynamically stable, afebrile and appropriate for discharge. Prior to patient discharge, I discussed and reviewed this case with my attending, Dr.Pollina   The patient appears reasonably screened and/or stabilized for discharge and I doubt any other medical condition or other [facility] requiring further screening, evaluation, or treatment in the ED at this time prior to discharge.   Final diagnoses:  Hypoglycemia    I personally performed the services described in this documentation, which was scribed in my presence. The recorded information has been reviewed and is accurate.    Joycie Peek, PA-C 08/24/15 2956  Gilda Crease, MD 08/24/15 [phone]

## 2015-08-24 NOTE — ED Notes (Signed)
Patient blood sugar dropped to 47. EMS states was semi responsive, diaphoretic. EMS gave amp of D50. Patient is responsive now. Patient states she feel horrible. Patient coming from [facility].

## 2015-08-24 NOTE — Discharge Instructions (Signed)
Is important for you to have your blood sugar monitored before insulin injections as this can contribute to hypoglycemia. Follow-up with your doctor in the next 2 days for reevaluation. Return to ED for any new or worsening symptoms.  Hypoglycemia Hypoglycemia occurs when the glucose in your blood is too low. Glucose is a type of sugar that is your body's main energy source. Hormones, such as insulin and glucagon, control the level of glucose in the blood. Insulin lowers blood glucose and glucagon increases blood glucose. Having too much insulin in your blood stream, or not eating enough food containing sugar, can result in hypoglycemia. Hypoglycemia can happen to people with or without diabetes. It can develop quickly and can be a medical emergency.  CAUSES   Missing or delaying meals.  Not eating enough carbohydrates at meals.  Taking too much diabetes medicine.  Not timing your oral diabetes medicine or insulin doses with meals, snacks, and exercise.  Nausea and vomiting.  Certain medicines.  Severe illnesses, such as hepatitis, kidney disorders, and certain eating disorders.  Increased activity or exercise without eating something extra or adjusting medicines.  Drinking too much alcohol.  A nerve disorder that affects body functions like your heart rate, blood pressure, and digestion (autonomic neuropathy).  A condition where the stomach muscles do not function properly (gastroparesis). Therefore, medicines and food may not absorb properly.  Rarely, a tumor of the pancreas can produce too much insulin. SYMPTOMS   Hunger.  Sweating (diaphoresis).  Change in body temperature.  Shakiness.  Headache.  Anxiety.  Lightheadedness.  Irritability.  Difficulty concentrating.  Dry mouth.  Tingling or numbness in the hands or feet.  Restless sleep or sleep disturbances.  Altered speech and coordination.  Change in mental status.  Seizures or prolonged  convulsions.  Combativeness.  Drowsiness (lethargic).  Weakness.  Increased heart rate or palpitations.  Confusion.  Pale, gray skin color.  Blurred or double vision.  Fainting. DIAGNOSIS  A physical exam and medical history will be performed. Your caregiver may make a diagnosis based on your symptoms. Blood tests and other lab tests may be performed to confirm a diagnosis. Once the diagnosis is made, your caregiver will see if your signs and symptoms go away once your blood glucose is raised.  TREATMENT  Usually, you can easily treat your hypoglycemia when you notice symptoms.  Check your blood glucose. If it is less than 70 mg/dl, take one of the following:   3-4 glucose tablets.    cup juice.    cup regular soda.   1 cup skim milk.   -1 tube of glucose gel.   5-6 hard candies.   Avoid high-fat drinks or food that may delay a rise in blood glucose levels.  Do not take more than the recommended amount of sugary foods, drinks, gel, or tablets. Doing so will cause your blood glucose to go too high.   Wait 10-15 minutes and recheck your blood glucose. If it is still less than 70 mg/dl or below your target range, repeat treatment.   Eat a snack if it is more than 1 hour until your next meal.  There may be a time when your blood glucose may go so low that you are unable to treat yourself at home when you start to notice symptoms. You may need someone to help you. You may even faint or be unable to swallow. If you cannot treat yourself, someone will need to bring you to the hospital.  HOME  CARE INSTRUCTIONS  If you have diabetes, follow your diabetes management plan by:  Taking your medicines as directed.  Following your exercise plan.  Following your meal plan. Do not skip meals. Eat on time.  Testing your blood glucose regularly. Check your blood glucose before and after exercise. If you exercise longer or different than usual, be sure to check blood  glucose more frequently.  Wearing your medical alert jewelry that says you have diabetes.  Identify the cause of your hypoglycemia. Then, develop ways to prevent the recurrence of hypoglycemia.  Do not take a hot bath or shower right after an insulin shot.  Always carry treatment with you. Glucose tablets are the easiest to carry.  If you are going to drink alcohol, drink it only with meals.  Tell friends or family members ways to keep you safe during a seizure. This may include removing hard or sharp objects from the area or turning you on your side.  Maintain a healthy weight. SEEK MEDICAL CARE IF:   You are having problems keeping your blood glucose in your target range.  You are having frequent episodes of hypoglycemia.  You feel you might be having side effects from your medicines.  You are not sure why your blood glucose is dropping so low.  You notice a change in vision or a new problem with your vision. SEEK IMMEDIATE MEDICAL CARE IF:   Confusion develops.  A change in mental status occurs.  The inability to swallow develops.  Fainting occurs.   This information is not intended to replace advice given to you by your health care provider. Make sure you discuss any questions you have with your health care provider.   Document Released: 08/21/2005 Document Revised: 08/26/2013 Document Reviewed: 04/27/2015 Elsevier Interactive Patient Education [employer]2016 Elsevier Inc.

## 2015-08-24 NOTE — ED Notes (Signed)
CBG 106 

## 2015-08-25 ENCOUNTER — Encounter (HOSPITAL_COMMUNITY): Payer: Self-pay | Admitting: Internal Medicine

## 2015-08-26 ENCOUNTER — Encounter (HOSPITAL_COMMUNITY): Payer: Self-pay | Admitting: Internal Medicine

## 2015-09-21 ENCOUNTER — Encounter (HOSPITAL_COMMUNITY): Payer: Self-pay | Admitting: Internal Medicine

## 2015-09-22 ENCOUNTER — Emergency Department (HOSPITAL_COMMUNITY)
Admission: EM | Admit: 2015-09-22 | Discharge: 2015-09-22 | Disposition: A | Discharge status: Home / Self Care | Payer: Medicare Other | Attending: Emergency Medicine | Admitting: Emergency Medicine

## 2015-09-22 ENCOUNTER — Emergency Department (HOSPITAL_COMMUNITY): Payer: Medicare Other

## 2015-09-22 ENCOUNTER — Encounter (HOSPITAL_COMMUNITY): Payer: Self-pay | Admitting: Emergency Medicine

## 2015-09-22 DIAGNOSIS — Z7982 Long term (current) use of aspirin: Secondary | ICD-10-CM | POA: Insufficient documentation

## 2015-09-22 DIAGNOSIS — Z794 Long term (current) use of insulin: Secondary | ICD-10-CM | POA: Diagnosis not present

## 2015-09-22 DIAGNOSIS — I2510 Atherosclerotic heart disease of native coronary artery without angina pectoris: Secondary | ICD-10-CM | POA: Insufficient documentation

## 2015-09-22 DIAGNOSIS — D72829 Elevated white blood cell count, unspecified: Secondary | ICD-10-CM | POA: Diagnosis not present

## 2015-09-22 DIAGNOSIS — Z79899 Other long term (current) drug therapy: Secondary | ICD-10-CM | POA: Diagnosis not present

## 2015-09-22 DIAGNOSIS — E7800 Pure hypercholesterolemia, unspecified: Secondary | ICD-10-CM | POA: Insufficient documentation

## 2015-09-22 DIAGNOSIS — E039 Hypothyroidism, unspecified: Secondary | ICD-10-CM | POA: Diagnosis not present

## 2015-09-22 DIAGNOSIS — Z8701 Personal history of pneumonia (recurrent): Secondary | ICD-10-CM | POA: Diagnosis not present

## 2015-09-22 DIAGNOSIS — Z951 Presence of aortocoronary bypass graft: Secondary | ICD-10-CM | POA: Diagnosis not present

## 2015-09-22 DIAGNOSIS — Z7952 Long term (current) use of systemic steroids: Secondary | ICD-10-CM | POA: Diagnosis not present

## 2015-09-22 DIAGNOSIS — H540 Blindness, both eyes: Secondary | ICD-10-CM | POA: Diagnosis not present

## 2015-09-22 DIAGNOSIS — I252 Old myocardial infarction: Secondary | ICD-10-CM | POA: Diagnosis not present

## 2015-09-22 DIAGNOSIS — I129 Hypertensive chronic kidney disease with stage 1 through stage 4 chronic kidney disease, or unspecified chronic kidney disease: Secondary | ICD-10-CM | POA: Insufficient documentation

## 2015-09-22 DIAGNOSIS — H409 Unspecified glaucoma: Secondary | ICD-10-CM | POA: Insufficient documentation

## 2015-09-22 DIAGNOSIS — M79604 Pain in right leg: Secondary | ICD-10-CM

## 2015-09-22 DIAGNOSIS — I509 Heart failure, unspecified: Secondary | ICD-10-CM | POA: Insufficient documentation

## 2015-09-22 DIAGNOSIS — Z8673 Personal history of transient ischemic attack (TIA), and cerebral infarction without residual deficits: Secondary | ICD-10-CM | POA: Diagnosis not present

## 2015-09-22 DIAGNOSIS — D649 Anemia, unspecified: Secondary | ICD-10-CM | POA: Diagnosis not present

## 2015-09-22 DIAGNOSIS — F0390 Unspecified dementia without behavioral disturbance: Secondary | ICD-10-CM | POA: Insufficient documentation

## 2015-09-22 DIAGNOSIS — Z7902 Long term (current) use of antithrombotics/antiplatelets: Secondary | ICD-10-CM | POA: Insufficient documentation

## 2015-09-22 DIAGNOSIS — N182 Chronic kidney disease, stage 2 (mild): Secondary | ICD-10-CM | POA: Diagnosis not present

## 2015-09-22 DIAGNOSIS — K219 Gastro-esophageal reflux disease without esophagitis: Secondary | ICD-10-CM | POA: Insufficient documentation

## 2015-09-22 DIAGNOSIS — E119 Type 2 diabetes mellitus without complications: Secondary | ICD-10-CM | POA: Diagnosis not present

## 2015-09-22 DIAGNOSIS — R451 Restlessness and agitation: Secondary | ICD-10-CM | POA: Insufficient documentation

## 2015-09-22 HISTORY — DX: Gastro-esophageal reflux disease without esophagitis: K21.9

## 2015-09-22 LAB — CBC
HEMATOCRIT: 32.5 % — AB (ref 36.0–46.0)
HEMOGLOBIN: 10.6 g/dL — AB (ref 12.0–15.0)
MCH: 29.9 pg (ref 26.0–34.0)
MCHC: 32.6 g/dL (ref 30.0–36.0)
MCV: 91.5 fL (ref 78.0–100.0)
Platelets: 243 10*3/uL (ref 150–400)
RBC: 3.55 MIL/uL — ABNORMAL LOW (ref 3.87–5.11)
RDW: 13.9 % (ref 11.5–15.5)
WBC: 13.9 10*3/uL — ABNORMAL HIGH (ref 4.0–10.5)

## 2015-09-22 LAB — BASIC METABOLIC PANEL
Anion gap: 13 (ref 5–15)
BUN: 42 mg/dL — ABNORMAL HIGH (ref 6–20)
CHLORIDE: 97 mmol/L — AB (ref 101–111)
CO2: 25 mmol/L (ref 22–32)
CREATININE: 2.42 mg/dL — AB (ref 0.44–1.00)
Calcium: 9.2 mg/dL (ref 8.9–10.3)
GFR calc non Af Amer: 19 mL/min — ABNORMAL LOW (ref >60)
GFR, EST AFRICAN AMERICAN: 22 mL/min — AB (ref >60)
Glucose, Bld: 144 mg/dL — ABNORMAL HIGH (ref 65–99)
POTASSIUM: 3.6 mmol/L (ref 3.5–5.1)
Sodium: 135 mmol/L (ref 135–145)

## 2015-09-22 LAB — URINALYSIS, ROUTINE W REFLEX MICROSCOPIC
Bilirubin Urine: NEGATIVE
GLUCOSE, UA: NEGATIVE mg/dL
Hgb urine dipstick: NEGATIVE
Ketones, ur: NEGATIVE mg/dL
LEUKOCYTES UA: NEGATIVE
Nitrite: NEGATIVE
PH: 5.5 (ref 5.0–8.0)
PROTEIN: 100 mg/dL — AB
Specific Gravity, Urine: 1.011 (ref 1.005–1.030)

## 2015-09-22 LAB — URINE MICROSCOPIC-ADD ON

## 2015-09-22 MED ORDER — OXYCODONE-ACETAMINOPHEN 5-325 MG PO TABS
0.5000 | ORAL_TABLET | Freq: Once | ORAL | Status: AC
  Administered 2015-09-22: 0.5 via ORAL
  Filled 2015-09-22: qty 1

## 2015-09-22 NOTE — ED Notes (Signed)
MD at bedside. 

## 2015-09-22 NOTE — ED Notes (Signed)
Phlebotomy at bedside attempting lab draw 

## 2015-09-22 NOTE — ED Notes (Signed)
Delay in lab draw pt not in room. 

## 2015-09-22 NOTE — ED Notes (Addendum)
Madilyn Hook, MD made aware of patient's decrease in heart rate. EKG ran and given to Madilyn Hook, MD No additional orders given at this time.

## 2015-09-22 NOTE — ED Notes (Signed)
Patient resting with eyes closed

## 2015-09-22 NOTE — ED Notes (Signed)
Per EMS pt is a resident at [facility] and the staff called police because the pt was agitated and bothering other residence  Pt is c/o right leg pain for the past 3 weeks  Pt is usually alert and oriented but has not been today  Pt is blind  Pt has not hx of dementia per staff  Pt is incontinent of urine and stool  Urine has strong odor and per staff pt felt warm as if she may have a fever  Upon arrival pt is calling out "Please help me, oh my leg"

## 2015-09-22 NOTE — ED Provider Notes (Signed)
CSN: [id number]     Arrival date & time 09/22/15  0206 History   By signing my name below, I, Arlan Organ, attest that this documentation has been prepared under the direction and in the presence of Tilden Fossa, MD.  Electronically Signed: Arlan Organ, ED Scribe. 09/22/2015. 3:07 AM.   Chief Complaint  Patient presents with  . Leg Pain  . possible UTI    The history is provided by the patient. No language interpreter was used.    LEVEL 5 CAVEAT-DEMENTIA    HPI Comments: Sequoyah Ramone brought in by EMS from [facility] is a 70 y.o. female with a PMHx of HTN, CAD, CHF, CKD, DM, stroke, who presents to the Emergency Department here for a possible UTI this evening. Staff called police as pt was agitated and disrupting the other residence. Pt now c/o constant, ongoing R leg pain x 3 weeks. Per triage note, pt is typically alert and oriented which changed today. Pt has urinary and bowel incontinence today with strong smelling urine. She uses a cane at baseline to help with ambulation. Level V caveat due to dementia. Staff report that she had increased agitation, has a history of agitation at times, is blind. She has not been walking for the last week, he believes minimally at baseline. Staff were concerned that she had a urinary tract infection and given her agitation.  PCP: No PCP Per Patient    Past Medical History  Diagnosis Date  . Hypertension   . CAD (coronary artery disease)   . 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 Macular edema     hx  . Glaucoma, both eyes   . High cholesterol   . CHF (congestive heart failure) (HCC)   . Chronic kidney disease (CKD), stage II (mild)     [name] 10/07/2014  . Old myocardial infarct     "I was told I've had one; don't know when" (10/08/2014)  . Pneumonia 1990's X 1  . Hypothyroidism   . Type II diabetes mellitus (HCC)   . Anemia   . History of blood transfusion 2003    "related to [facility]"  . Stroke [facility])     "I've been told I've  had one; I don't remember having any reaction from it at all"; denies residual on 10/08/2014  . Heart failure (HCC)   . GERD (gastroesophageal reflux disease)    Past Surgical History  Procedure Laterality Date  . Coronary artery bypass graft  01/2002    "CABG X4" at [place]  . Cataract extraction w/ intraocular lens  implant, bilateral Bilateral    Family History  Problem Relation Age of Onset  . CAD Neg Hx    Social History  Substance Use Topics  . Smoking status: Never Smoker   . Smokeless tobacco: Never Used  . Alcohol Use: No   OB History    No data available     Review of Systems  LEVEL 5 CAVEAT- DEMENTIA   Allergies  Epinephrine  Home Medications   Prior to Admission medications   Medication Sig Start Date End Date Taking? Authorizing Provider  ALPRAZolam (XANAX) 0.25 MG tablet Take 0.25 mg by mouth 2 (two) times daily.    Yes Historical Provider, MD  amLODipine (NORVASC) 10 MG tablet Take 10 mg by mouth daily. 09/27/14  Yes Historical Provider, MD  ARIPiprazole (ABILIFY) 5 MG tablet Take 5 mg by mouth daily.   Yes Historical Provider, MD  aspirin EC 81 MG EC  tablet Take 1 tablet (81 mg total) by mouth daily. 11/26/14  Yes Renae Fickle, MD  atorvastatin (LIPITOR) 80 MG tablet Take 80 mg by mouth at bedtime. 09/21/14  Yes Historical Provider, MD  cloNIDine (CATAPRES) 0.1 MG tablet Take 0.1 mg by mouth 2 (two) times daily.  07/08/15  Yes Historical Provider, MD  clopidogrel (PLAVIX) 75 MG tablet Take 75 mg by mouth daily. 09/24/14  Yes Historical Provider, MD  dorzolamide-timolol (COSOPT) 22.3-6.8 MG/ML ophthalmic solution Place 1 drop into both eyes every 12 (twelve) hours. 10/06/14  Yes Historical Provider, MD  ferrous sulfate 325 (65 FE) MG tablet Take 1 tablet (325 mg total) by mouth 2 (two) times daily with a meal. 03/24/15  Yes Sharee Holster, NP  furosemide (LASIX) 40 MG tablet Take 40 mg by mouth 2 (two) times daily. If weight is greater than 141 lbs please take  an extra 40 mg of Lasix   Yes Historical Provider, MD  hydrALAZINE (APRESOLINE) 100 MG tablet Take 1 tablet (100 mg total) by mouth every 8 (eight) hours. 11/03/14  Yes Joseph Art, DO  insulin aspart (NOVOLOG) 100 UNIT/ML injection Inject 0-12 Units into the skin 3 (three) times daily as needed for high blood sugar.    Yes Historical Provider, MD  insulin glargine (LANTUS) 100 UNIT/ML injection Inject 16 Units into the skin at bedtime.    Yes Historical Provider, MD  isosorbide dinitrate (ISORDIL) 10 MG tablet Take 2 tablets (20 mg total) by mouth 3 (three) times daily. 10/10/14  Yes Jerald Kief, MD  latanoprost (XALATAN) 0.005 % ophthalmic solution Place 1 drop into the right eye at bedtime.   Yes Historical Provider, MD  levothyroxine (SYNTHROID, LEVOTHROID) 100 MCG tablet Take 1 tablet (100 mcg total) by mouth daily before breakfast. 11/26/14  Yes Renae Fickle, MD  Melatonin 3 MG TABS Take 3 mg by mouth at bedtime.   Yes Historical Provider, MD  oxyCODONE-acetaminophen (PERCOCET/ROXICET) 5-325 MG per tablet Take 1 tablet by mouth every 4 (four) hours as needed for severe pain. 03/04/15  Yes Renae Fickle, MD  pantoprazole (PROTONIX) 20 MG tablet Take 20 mg by mouth daily.   Yes Historical Provider, MD  polyvinyl alcohol (LIQUIFILM TEARS) 1.4 % ophthalmic solution Place 1 drop into the left eye 4 (four) times daily.   Yes Historical Provider, MD  prednisoLONE acetate (PRED FORTE) 1 % ophthalmic suspension Place 1 drop into the left eye 4 (four) times daily. 09/27/14  Yes Historical Provider, MD  sertraline (ZOLOFT) 100 MG tablet Take 200 mg by mouth at bedtime.   Yes Historical Provider, MD  spironolactone (ALDACTONE) 25 MG tablet Take 0.5 tablets (12.5 mg total) by mouth daily. 07/14/15  Yes Dolores Patty, MD  traMADol (ULTRAM) 50 MG tablet Take 1 tablet (50 mg total) by mouth every 6 (six) hours as needed for moderate pain. 03/24/15  Yes Sharee Holster, NP  Vitamin D, Ergocalciferol,  (DRISDOL) 50000 UNITS CAPS capsule Take 50,000 Units by mouth See admin instructions. On the 3rd day of each month   Yes Historical Provider, MD   Triage Vitals: BP 140/55 mmHg  Pulse 49  Temp(Src) 98.5 F (36.9 C) (Oral)  Resp 13  SpO2 94%   Physical Exam  Constitutional: She appears well-developed and well-nourished.  HENT:  Head: Normocephalic and atraumatic.  Cardiovascular: Normal rate and regular rhythm.   No murmur heard. Pulmonary/Chest: Effort normal and breath sounds normal. No respiratory distress.  Abdominal: Soft. There is no  tenderness. There is no rebound and no guarding.  Musculoskeletal: She exhibits no edema.  Mild diffuse tenderness throughout RLE without discrete bony tenderness.  2+ DP pulses bilaterally.  No erythema.    Neurological: She is alert.  On asking orientation questions patient states 2015 for year and [place] for location.    Skin: Skin is warm and dry.  Psychiatric:  Anxious appearing, loud speech.    Nursing note and vitals reviewed.   ED Course  Procedures (including critical care time)  DIAGNOSTIC STUDIES: Oxygen Saturation is 92% on RA, low by my interpretation.    COORDINATION OF CARE: 3:06 AM- Will give Percocet. Will order DG hip unilat with pelvis 2-3 views R, DG knee complete 4 views R, urinalysis, CBC, and BMP. Discussed treatment plan with pt at bedside and pt agreed to plan.     Labs Review Labs Reviewed  URINALYSIS, ROUTINE W REFLEX MICROSCOPIC (NOT AT [facility]) - Abnormal; Notable for the following:    Protein, ur 100 (*)    All other components within normal limits  CBC - Abnormal; Notable for the following:    WBC 13.9 (*)    RBC 3.55 (*)    Hemoglobin 10.6 (*)    HCT 32.5 (*)    All other components within normal limits  BASIC METABOLIC PANEL - Abnormal; Notable for the following:    Chloride 97 (*)    Glucose, Bld 144 (*)    BUN 42 (*)    Creatinine, Ser 2.42 (*)    GFR calc non Af Amer 19 (*)    GFR calc Af Amer  22 (*)    All other components within normal limits  URINE MICROSCOPIC-ADD ON - Abnormal; Notable for the following:    Squamous Epithelial / LPF 0-5 (*)    Bacteria, UA RARE (*)    All other components within normal limits  URINE CULTURE    Imaging Review Dg Chest 2 View  09/22/2015  CLINICAL DATA:  70 year old female with fever and UTI. Patient presenting with confusion. EXAM: CHEST  2 VIEW COMPARISON:  Radiograph dated 07/30/2015 FINDINGS: Two views of the chest do not demonstrate a focal consolidation. There is no pleural effusion or pneumothorax. Stable cardiac silhouette. Median sternotomy wires and CABG vascular clips. No acute osseous pathology. IMPRESSION: No active cardiopulmonary disease. Electronically Signed   By: Elgie Collard M.D.   On: 09/22/2015 04:59   Dg Knee Complete 4 Views Right  09/22/2015  CLINICAL DATA:  Agitation at nursing home, knee pain.  No injury. EXAM: RIGHT KNEE - COMPLETE 4+ VIEW COMPARISON:  RIGHT knee radiograph March 02, 2015 FINDINGS: No acute fracture deformity. No dislocation. 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 Osteopenia without destructive bony lesions. Moderate to severe vascular calcifications. Soft tissue planes are nonsuspicious. IMPRESSION: Similar osteoarthrosis without acute fracture deformity or dislocation. Electronically Signed   By: Awilda Metro M.D.   On: 09/22/2015 03:57   Dg Hip Unilat With Pelvis 2-3 Views Right  09/22/2015  CLINICAL DATA:  Agitation, leg pain.  No injury. EXAM: DG HIP (WITH OR WITHOUT PELVIS) 2-3V RIGHT COMPARISON:  None. FINDINGS: There is no evidence of hip fracture or dislocation. There is no evidence of arthropathy or other focal bone abnormality. Moderate sacroiliac osteoarthrosis. Moderate to severe aortoiliac vascular calcifications. Surgical clips in LEFT inguinal soft tissues most compatible with prior vascular access.  IMPRESSION: No acute fracture deformity or dislocation. Electronically Signed   By:  Awilda Metro M.D.   On: 09/22/2015 03:54   I have personally reviewed and evaluated these images and lab results as part of my medical decision-making.   EKG Interpretation   Date/Time:  Wednesday September 22 2015 06:49:36 EST Ventricular Rate:  47 PR Interval:  173 QRS Duration: 131 QT Interval:  505 QTC Calculation: 446 R Axis:   36 Text Interpretation:  Sinus bradycardia LVH with secondary repolarization  abnormality Confirmed by Lincoln Brigham [phone]) on 09/22/2015 6:53:57 AM      MDM   Final diagnoses:  Right leg pain  Leukocytosis   Patient with history of dementia here with agitation and complaining of right leg pain. She was given a small dose of pain medications in the emergency department. On repeat evaluation she states that she has been having some right knee pain, no history of injury. She is calm and appropriate on examination. Examination is not consistent with acute septic arthritis, fracture, dislocation. UA is not consistent with urinary tract infection, chest x-ray with no evidence of pneumonia. CBC with leukocytosis of unclear significance. Plan to return back to nursing home with close outpatient follow-up. Attempted to call family listed as contacts, Reece Packer, unable to reach - no answer, no voice mail.  I personally performed the services described in this documentation, which was scribed in my presence. The recorded information has been reviewed and is accurate.   Tilden Fossa, MD 09/22/15 [phone]

## 2015-09-22 NOTE — ED Notes (Signed)
Patient transported to X-ray 

## 2015-09-22 NOTE — Discharge Instructions (Signed)
The cause of Taylor Padilla's leg pain and agitation was not identified today.  Her White Blood Cell count was elevated and this will need to be rechecked by her family doctor.  Get her rechecked if she develops fevers or new concerning symptoms.     Leukocytosis Leukocytosis means you have more white blood cells than normal. White blood cells are made in your bone marrow. The main job of white blood cells is to fight infection. Having too many white blood cells is a common condition. It can develop as a result of many types of medical problems. CAUSES  In some cases, your bone marrow may be normal, but it is still making too many white blood cells. This could be the result of:  Infection.  Injury.  Physical stress.  Emotional stress.  Surgery.  Allergic reactions.  Tumors that do not start in the blood or bone marrow.  An inherited disease.  Certain medicines.  Pregnancy and labor. In other cases, you may have a bone marrow disorder that is causing your body to make too many white blood cells. Bone marrow disorders include:  Leukemia. This is a type of blood cancer.  Myeloproliferative disorders. These disorders cause blood cells to grow abnormally. SYMPTOMS  Some people have no symptoms. Others have symptoms due to the medical problem that is causing their leukocytosis. These symptoms may include:  Bleeding.  Bruising.  Fever.  Night sweats.  Repeated infections.  Weakness.  Weight loss. DIAGNOSIS  Leukocytosis is often found during blood tests that are done as part of a normal physical exam. Your caregiver will probably order other tests to help determine why you have too many white blood cells. These tests may include:  A complete blood count (CBC). This test measures all the types of blood cells in your body.  Chest X-rays, urine tests (urinalysis), or other tests to look for signs of infection.  Bone marrow aspiration. For this test, a needle is put into your  bone. Cells from the bone marrow are removed through the needle. The cells are then examined under a microscope. TREATMENT  Treatment is usually not needed for leukocytosis. However, if a disorder is causing your leukocytosis, it will need to be treated. Treatment may include:  Antibiotic medicines if you have a bacterial infection.  Bone marrow transplant. Your diseased bone marrow is replaced with healthy cells that will grow new bone marrow.  Chemotherapy. This is the use of drugs to kill cancer cells. HOME CARE INSTRUCTIONS  Only take over-the-counter or prescription medicines as directed by your caregiver.  Maintain a healthy weight. Ask your caregiver what weight is best for you.  Eat foods that are low in saturated fats and high in fiber. Eat plenty of fruits and vegetables.  Drink enough fluids to keep your urine clear or pale yellow.  Get 30 minutes of exercise at least 5 times a week. Check with your caregiver before starting a new exercise routine.  Limit caffeine and alcohol.  Do not smoke.  Keep all follow-up appointments as directed by your caregiver. SEEK MEDICAL CARE IF:  You feel weak or more tired than usual.  You develop chills, a cough, or nasal congestion.  You lose weight without trying.  You have night sweats.  You bruise easily. SEEK IMMEDIATE MEDICAL CARE IF:  You bleed more than normal.  You have chest pain.  You have trouble breathing.  You have a fever.  You have uncontrolled nausea or vomiting.  You feel  dizzy or lightheaded. MAKE SURE YOU:  Understand these instructions.  Will watch your condition.  Will get help right away if you are not doing well or get worse.   This information is not intended to replace advice given to you by your health care provider. Make sure you discuss any questions you have with your health care provider.   Document Released: 08/10/2011 Document Revised: 11/13/2011 Document Reviewed:  02/22/2015 Elsevier Interactive Patient Education 2016 Else[employer] Inc.  Pain Without a Known Cause WHAT IS PAIN WITHOUT A KNOWN CAUSE? Pain can occur in any part of the body and can range from mild to severe. Sometimes no cause can be found for why you are having pain. Some types of pain that can occur without a known cause include:   Headache.  Back pain.  Abdominal pain.  Neck pain. HOW IS PAIN WITHOUT A KNOWN CAUSE DIAGNOSED?  Your health care provider will try to find the cause of your pain. This may include:  Physical exam.  Medical history.  Blood tests.  Urine tests.  X-rays. If no cause is found, your health care provider may diagnose you with pain without a known cause.  IS THERE TREATMENT FOR PAIN WITHOUT A CAUSE?  Treatment depends on the kind of pain you have. Your health care provider may prescribe medicines to help relieve your pain.  WHAT CAN I DO AT HOME FOR MY PAIN?   Take medicines only as directed by your health care provider.  Stop any activities that cause pain. During periods of severe pain, bed rest may help.  Try to reduce your stress with activities such as yoga or meditation. Talk to your health care provider for other stress-reducing activity recommendations.  Exercise regularly, if approved by your health care provider.  Eat a healthy diet that includes fruits and vegetables. This may improve pain. Talk to your health care provider if you have any questions about your diet. WHAT IF MY PAIN DOES NOT GET BETTER?  If you have a painful condition and no reason can be found for the pain or the pain gets worse, it is important to follow up with your health care provider. It may be necessary to repeat tests and look further for a possible cause.    This information is not intended to replace advice given to you by your health care provider. Make sure you discuss any questions you have with your health care provider.   Document Released: 05/16/2001  Document Revised: 09/11/2014 Document Reviewed: 01/06/2014 Elsevier Interactive Patient Education [employer]

## 2015-09-23 LAB — URINE CULTURE: Culture: 6000

## 2015-10-24 ENCOUNTER — Encounter (HOSPITAL_COMMUNITY): Payer: Self-pay | Admitting: Emergency Medicine

## 2015-10-24 ENCOUNTER — Emergency Department (HOSPITAL_COMMUNITY)
Admission: EM | Admit: 2015-10-24 | Discharge: 2015-10-24 | Disposition: A | Discharge status: Home / Self Care | Payer: Medicare Other | Attending: Emergency Medicine | Admitting: Emergency Medicine

## 2015-10-24 DIAGNOSIS — H540 Blindness, both eyes: Secondary | ICD-10-CM | POA: Diagnosis not present

## 2015-10-24 DIAGNOSIS — N182 Chronic kidney disease, stage 2 (mild): Secondary | ICD-10-CM | POA: Diagnosis not present

## 2015-10-24 DIAGNOSIS — E7800 Pure hypercholesterolemia, unspecified: Secondary | ICD-10-CM | POA: Diagnosis not present

## 2015-10-24 DIAGNOSIS — I252 Old myocardial infarction: Secondary | ICD-10-CM | POA: Diagnosis not present

## 2015-10-24 DIAGNOSIS — Z951 Presence of aortocoronary bypass graft: Secondary | ICD-10-CM | POA: Diagnosis not present

## 2015-10-24 DIAGNOSIS — I509 Heart failure, unspecified: Secondary | ICD-10-CM | POA: Diagnosis not present

## 2015-10-24 DIAGNOSIS — Z862 Personal history of diseases of the blood and blood-forming organs and certain disorders involving the immune mechanism: Secondary | ICD-10-CM | POA: Diagnosis not present

## 2015-10-24 DIAGNOSIS — K219 Gastro-esophageal reflux disease without esophagitis: Secondary | ICD-10-CM | POA: Insufficient documentation

## 2015-10-24 DIAGNOSIS — I129 Hypertensive chronic kidney disease with stage 1 through stage 4 chronic kidney disease, or unspecified chronic kidney disease: Secondary | ICD-10-CM | POA: Insufficient documentation

## 2015-10-24 DIAGNOSIS — Z7982 Long term (current) use of aspirin: Secondary | ICD-10-CM | POA: Insufficient documentation

## 2015-10-24 DIAGNOSIS — Z794 Long term (current) use of insulin: Secondary | ICD-10-CM | POA: Diagnosis not present

## 2015-10-24 DIAGNOSIS — I2510 Atherosclerotic heart disease of native coronary artery without angina pectoris: Secondary | ICD-10-CM | POA: Insufficient documentation

## 2015-10-24 DIAGNOSIS — Z8701 Personal history of pneumonia (recurrent): Secondary | ICD-10-CM | POA: Insufficient documentation

## 2015-10-24 DIAGNOSIS — F0390 Unspecified dementia without behavioral disturbance: Secondary | ICD-10-CM | POA: Diagnosis not present

## 2015-10-24 DIAGNOSIS — Z7902 Long term (current) use of antithrombotics/antiplatelets: Secondary | ICD-10-CM | POA: Insufficient documentation

## 2015-10-24 DIAGNOSIS — E11649 Type 2 diabetes mellitus with hypoglycemia without coma: Secondary | ICD-10-CM | POA: Insufficient documentation

## 2015-10-24 DIAGNOSIS — E162 Hypoglycemia, unspecified: Secondary | ICD-10-CM

## 2015-10-24 DIAGNOSIS — H409 Unspecified glaucoma: Secondary | ICD-10-CM | POA: Insufficient documentation

## 2015-10-24 DIAGNOSIS — Z8673 Personal history of transient ischemic attack (TIA), and cerebral infarction without residual deficits: Secondary | ICD-10-CM | POA: Diagnosis not present

## 2015-10-24 DIAGNOSIS — E039 Hypothyroidism, unspecified: Secondary | ICD-10-CM | POA: Insufficient documentation

## 2015-10-24 DIAGNOSIS — Z79899 Other long term (current) drug therapy: Secondary | ICD-10-CM | POA: Diagnosis not present

## 2015-10-24 DIAGNOSIS — N390 Urinary tract infection, site not specified: Secondary | ICD-10-CM

## 2015-10-24 LAB — I-STAT CHEM 8, ED
BUN: 43 mg/dL — ABNORMAL HIGH (ref 6–20)
CALCIUM ION: 1.07 mmol/L — AB (ref 1.13–1.30)
Chloride: 92 mmol/L — ABNORMAL LOW (ref 101–111)
Creatinine, Ser: 2.4 mg/dL — ABNORMAL HIGH (ref 0.44–1.00)
GLUCOSE: 113 mg/dL — AB (ref 65–99)
HCT: 37 % (ref 36.0–46.0)
Hemoglobin: 12.6 g/dL (ref 12.0–15.0)
Potassium: 3.3 mmol/L — ABNORMAL LOW (ref 3.5–5.1)
SODIUM: 135 mmol/L (ref 135–145)
TCO2: 26 mmol/L (ref 0–100)

## 2015-10-24 LAB — CBC WITH DIFFERENTIAL/PLATELET
Basophils Absolute: 0 10*3/uL (ref 0.0–0.1)
Basophils Relative: 0 %
EOS ABS: 0.2 10*3/uL (ref 0.0–0.7)
EOS PCT: 2 %
HCT: 35.5 % — ABNORMAL LOW (ref 36.0–46.0)
Hemoglobin: 11.4 g/dL — ABNORMAL LOW (ref 12.0–15.0)
LYMPHS ABS: 0.8 10*3/uL (ref 0.7–4.0)
Lymphocytes Relative: 8 %
MCH: 29.6 pg (ref 26.0–34.0)
MCHC: 32.1 g/dL (ref 30.0–36.0)
MCV: 92.2 fL (ref 78.0–100.0)
MONO ABS: 0.7 10*3/uL (ref 0.1–1.0)
MONOS PCT: 8 %
Neutro Abs: 7.6 10*3/uL (ref 1.7–7.7)
Neutrophils Relative %: 82 %
PLATELETS: 350 10*3/uL (ref 150–400)
RBC: 3.85 MIL/uL — ABNORMAL LOW (ref 3.87–5.11)
RDW: 14.6 % (ref 11.5–15.5)
WBC: 9.2 10*3/uL (ref 4.0–10.5)

## 2015-10-24 LAB — URINE MICROSCOPIC-ADD ON
RBC / HPF: NONE SEEN RBC/hpf (ref 0–5)
Squamous Epithelial / LPF: NONE SEEN

## 2015-10-24 LAB — CBG MONITORING, ED
GLUCOSE-CAPILLARY: 104 mg/dL — AB (ref 65–99)
GLUCOSE-CAPILLARY: 119 mg/dL — AB (ref 65–99)

## 2015-10-24 LAB — URINALYSIS, ROUTINE W REFLEX MICROSCOPIC
BILIRUBIN URINE: NEGATIVE
Glucose, UA: NEGATIVE mg/dL
HGB URINE DIPSTICK: NEGATIVE
KETONES UR: NEGATIVE mg/dL
NITRITE: NEGATIVE
PH: 6 (ref 5.0–8.0)
Protein, ur: 100 mg/dL — AB
Specific Gravity, Urine: 1.007 (ref 1.005–1.030)

## 2015-10-24 MED ORDER — FOSFOMYCIN TROMETHAMINE 3 G PO PACK
3.0000 g | PACK | Freq: Once | ORAL | Status: AC
Start: 2015-10-24 — End: 2015-10-24
  Administered 2015-10-24: 3 g via ORAL
  Filled 2015-10-24: qty 3

## 2015-10-24 NOTE — ED Notes (Signed)
Pt is from [facility] with c/o hypoglycemia (cbg 41) on arrival by EMS, pt was diaphoretic and unresponsive. IV was established, dextrose and pb sandwich was given. Last cbg 123, pt alert and oriented.

## 2015-10-24 NOTE — ED Notes (Signed)
Bed: ZO10 Expected date:  Expected time:  Means of arrival:  Comments: EMS 69yo F hypoglycemia CBG 41 initially - now 123

## 2015-10-24 NOTE — ED Provider Notes (Signed)
CSN: [id number]     Arrival date & time 10/24/15  0100 History  By signing my name below, I, Phillis Haggis, attest that this documentation has been prepared under the direction and in the presence of Khing Belcher, MD. Electronically Signed: Phillis Haggis, ED Scribe. 10/24/2015. 3:24 AM.  Chief Complaint  Patient presents with  . Hypoglycemia   Patient is a 70 y.o. female presenting with hypoglycemia. The history is provided by the EMS personnel. The history is limited by the condition of the patient. No language interpreter was used.  Hypoglycemia Initial blood sugar:  41 Blood sugar after intervention:  123 Severity:  Moderate Onset quality:  Sudden Timing:  Constant Progression:  Improving Chronicity:  New Diabetic status:  Controlled with insulin Relieved by:  Eating and IV glucose Associated symptoms: no obesity   Risk factors: no alcohol abuse   HPI Comments (Level 5 Caveat due to altered mental status): Taylor Padilla is a 70 y.o. Female with a hx of HTN, CAD, CHF, CKD, MI, Stroke, and Type II DM brought in by EMS who presents to the Emergency Department complaining of decreased CBG onset PTA. Pt comes from [facility] after having a CBG of 43. Per EMS, pt was unresponsive and diaphoretic when they arrived. She was given an IV, dextrose and a peanut butter sandwich en route. Retake of CBG shows pt's levels are now 123 and she is alert and oriented. Pt states that she does not know if she ate this evening. She is on insulin only but does not remember if she took it this evening. She does not remember if she took any of her medication this evening.    Past Medical History  Diagnosis Date  . Hypertension   . CAD (coronary artery disease)   . 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 Macular edema     hx  . Glaucoma, both eyes   . High cholesterol   . CHF (congestive heart failure) (HCC)   . Chronic kidney disease (CKD), stage II (mild)     [name] 10/07/2014  . Old  myocardial infarct     "I was told I've had one; don't know when" (10/08/2014)  . Pneumonia 1990's X 1  . Hypothyroidism   . Type II diabetes mellitus (HCC)   . Anemia   . History of blood transfusion 2003    "related to [facility]"  . Stroke [facility])     "I've been told I've had one; I don't remember having any reaction from it at all"; denies residual on 10/08/2014  . Heart failure (HCC)   . GERD (gastroesophageal reflux disease)    Past Surgical History  Procedure Laterality Date  . Coronary artery bypass graft  01/2002    "CABG X4" at [place]  . Cataract extraction w/ intraocular lens  implant, bilateral Bilateral    Family History  Problem Relation Age of Onset  . CAD Neg Hx    Social History  Substance Use Topics  . Smoking status: Never Smoker   . Smokeless tobacco: Never Used  . Alcohol Use: No   OB History    No data available     Review of Systems  Unable to perform ROS: Dementia   Allergies  Epinephrine  Home Medications   Prior to Admission medications   Medication Sig Start Date End Date Taking? Authorizing Provider  ALPRAZolam (XANAX) 0.25 MG tablet Take 0.25 mg by mouth 2 (two) times daily.     Historical Provider,  MD  amLODipine (NORVASC) 10 MG tablet Take 10 mg by mouth daily. 09/27/14   Historical Provider, MD  ARIPiprazole (ABILIFY) 5 MG tablet Take 5 mg by mouth daily.    Historical Provider, MD  aspirin EC 81 MG EC tablet Take 1 tablet (81 mg total) by mouth daily. 11/26/14   Renae Fickle, MD  atorvastatin (LIPITOR) 80 MG tablet Take 80 mg by mouth at bedtime. 09/21/14   Historical Provider, MD  cloNIDine (CATAPRES) 0.1 MG tablet Take 0.1 mg by mouth 2 (two) times daily.  07/08/15   Historical Provider, MD  clopidogrel (PLAVIX) 75 MG tablet Take 75 mg by mouth daily. 09/24/14   Historical Provider, MD  dorzolamide-timolol (COSOPT) 22.3-6.8 MG/ML ophthalmic solution Place 1 drop into both eyes every 12 (twelve) hours. 10/06/14   Historical Provider, MD  ferrous  sulfate 325 (65 FE) MG tablet Take 1 tablet (325 mg total) by mouth 2 (two) times daily with a meal. 03/24/15   Sharee Holster, NP  furosemide (LASIX) 40 MG tablet Take 40 mg by mouth 2 (two) times daily. If weight is greater than 141 lbs please take an extra 40 mg of Lasix    Historical Provider, MD  hydrALAZINE (APRESOLINE) 100 MG tablet Take 1 tablet (100 mg total) by mouth every 8 (eight) hours. 11/03/14   Joseph Art, DO  insulin aspart (NOVOLOG) 100 UNIT/ML injection Inject 0-12 Units into the skin 3 (three) times daily as needed for high blood sugar.     Historical Provider, MD  insulin glargine (LANTUS) 100 UNIT/ML injection Inject 16 Units into the skin at bedtime.     Historical Provider, MD  isosorbide dinitrate (ISORDIL) 10 MG tablet Take 2 tablets (20 mg total) by mouth 3 (three) times daily. 10/10/14   Jerald Kief, MD  latanoprost (XALATAN) 0.005 % ophthalmic solution Place 1 drop into the right eye at bedtime.    Historical Provider, MD  levothyroxine (SYNTHROID, LEVOTHROID) 100 MCG tablet Take 1 tablet (100 mcg total) by mouth daily before breakfast. 11/26/14   Renae Fickle, MD  Melatonin 3 MG TABS Take 3 mg by mouth at bedtime.    Historical Provider, MD  oxyCODONE-acetaminophen (PERCOCET/ROXICET) 5-325 MG per tablet Take 1 tablet by mouth every 4 (four) hours as needed for severe pain. 03/04/15   Renae Fickle, MD  pantoprazole (PROTONIX) 20 MG tablet Take 20 mg by mouth daily.    Historical Provider, MD  polyvinyl alcohol (LIQUIFILM TEARS) 1.4 % ophthalmic solution Place 1 drop into the left eye 4 (four) times daily.    Historical Provider, MD  prednisoLONE acetate (PRED FORTE) 1 % ophthalmic suspension Place 1 drop into the left eye 4 (four) times daily. 09/27/14   Historical Provider, MD  sertraline (ZOLOFT) 100 MG tablet Take 200 mg by mouth at bedtime.    Historical Provider, MD  spironolactone (ALDACTONE) 25 MG tablet Take 0.5 tablets (12.5 mg total) by mouth daily. 07/14/15    Dolores Patty, MD  traMADol (ULTRAM) 50 MG tablet Take 1 tablet (50 mg total) by mouth every 6 (six) hours as needed for moderate pain. 03/24/15   Sharee Holster, NP  Vitamin D, Ergocalciferol, (DRISDOL) 50000 UNITS CAPS capsule Take 50,000 Units by mouth See admin instructions. On the 3rd day of each month    Historical Provider, MD   BP 151/56 mmHg  Pulse 58  Temp(Src) 97.9 F (36.6 C) (Oral)  Resp 16  Ht 5\' 1"  (1.549 m)  Wt  147 lb (66.679 kg)  BMI 27.79 kg/m2  SpO2 100% Physical Exam  Constitutional: She is oriented to person, place, and time. She appears well-developed and well-nourished. No distress.  HENT:  Head: Normocephalic and atraumatic.  Mouth/Throat: Oropharynx is clear and moist. No oropharyngeal exudate.  Trachea midline  Eyes: Conjunctivae and EOM are normal. Pupils are equal, round, and reactive to light.  Lens implants with pupillary defects; left was done before 2001; right was done after 2001  Neck: Trachea normal and normal range of motion. Neck supple. No JVD present. Carotid bruit is not present.  Cardiovascular: Normal rate and regular rhythm.  Exam reveals no gallop and no friction rub.   No murmur heard. Pulmonary/Chest: Effort normal and breath sounds normal. No stridor. She has no wheezes. She has no rales.  Abdominal: Soft. She exhibits no mass. Bowel sounds are increased. There is no tenderness. There is no rebound and no guarding.  Musculoskeletal: Normal range of motion.  Lymphadenopathy:    She has no cervical adenopathy.  Neurological: She is alert and oriented to person, place, and time. She has normal reflexes. No cranial nerve deficit. She exhibits normal muscle tone. Coordination normal.  Cranial nerves 2-12 intact  Skin: Skin is warm and dry. She is not diaphoretic.  Psychiatric: She has a normal mood and affect. Her behavior is normal.  Nursing note and vitals reviewed.   ED Course  Procedures (including critical care  time) DIAGNOSTIC STUDIES: Oxygen Saturation is 100% on RA, normal by my interpretation.    COORDINATION OF CARE: 1:14 AM-labs   Labs Review Labs Reviewed  CBC WITH DIFFERENTIAL/PLATELET - Abnormal; Notable for the following:    RBC 3.85 (*)    Hemoglobin 11.4 (*)    HCT 35.5 (*)    All other components within normal limits  URINALYSIS, ROUTINE W REFLEX MICROSCOPIC (NOT AT [facility]) - Abnormal; Notable for the following:    APPearance TURBID (*)    Protein, ur 100 (*)    Leukocytes, UA LARGE (*)    All other components within normal limits  URINE MICROSCOPIC-ADD ON - Abnormal; Notable for the following:    Bacteria, UA MANY (*)    All other components within normal limits  I-STAT CHEM 8, ED - Abnormal; Notable for the following:    Potassium 3.3 (*)    Chloride 92 (*)    BUN 43 (*)    Creatinine, Ser 2.40 (*)    Glucose, Bld 113 (*)    Calcium, Ion 1.07 (*)    All other components within normal limits  CBG MONITORING, ED - Abnormal; Notable for the following:    Glucose-Capillary 104 (*)    All other components within normal limits  CBG MONITORING, ED - Abnormal; Notable for the following:    Glucose-Capillary 119 (*)    All other components within normal limits    Imaging Review No results found. I have personally reviewed and evaluated these images and lab results as part of my medical decision-making.   EKG Interpretation None      MDM   Final diagnoses:  None   Results for orders placed or performed during the hospital encounter of 10/24/15  CBC with Differential/Platelet  Result Value Ref Range   WBC 9.2 4.0 - 10.5 K/uL   RBC 3.85 (L) 3.87 - 5.11 MIL/uL   Hemoglobin 11.4 (L) 12.0 - 15.0 g/dL   HCT 16.1 (L) 09.6 - 04.5 %   MCV 92.2 78.0 - 100.0 fL  MCH 29.6 26.0 - 34.0 pg   MCHC 32.1 30.0 - 36.0 g/dL   RDW 16.1 09.6 - 04.5 %   Platelets 350 150 - 400 K/uL   Neutrophils Relative % 82 %   Neutro Abs 7.6 1.7 - 7.7 K/uL   Lymphocytes Relative 8 %    Lymphs Abs 0.8 0.7 - 4.0 K/uL   Monocytes Relative 8 %   Monocytes Absolute 0.7 0.1 - 1.0 K/uL   Eosinophils Relative 2 %   Eosinophils Absolute 0.2 0.0 - 0.7 K/uL   Basophils Relative 0 %   Basophils Absolute 0.0 0.0 - 0.1 K/uL  Urinalysis, Routine w reflex microscopic (not at [facility])  Result Value Ref Range   Color, Urine YELLOW YELLOW   APPearance TURBID (A) CLEAR   Specific Gravity, Urine 1.007 1.005 - 1.030   pH 6.0 5.0 - 8.0   Glucose, UA NEGATIVE NEGATIVE mg/dL   Hgb urine dipstick NEGATIVE NEGATIVE   Bilirubin Urine NEGATIVE NEGATIVE   Ketones, ur NEGATIVE NEGATIVE mg/dL   Protein, ur 409 (A) NEGATIVE mg/dL   Nitrite NEGATIVE NEGATIVE   Leukocytes, UA LARGE (A) NEGATIVE  Urine microscopic-add on  Result Value Ref Range   Squamous Epithelial / LPF NONE SEEN NONE SEEN   WBC, UA TOO NUMEROUS TO COUNT 0 - 5 WBC/hpf   RBC / HPF NONE SEEN 0 - 5 RBC/hpf   Bacteria, UA MANY (A) NONE SEEN  I-Stat Chem 8, ED  Result Value Ref Range   Sodium 135 135 - 145 mmol/L   Potassium 3.3 (L) 3.5 - 5.1 mmol/L   Chloride 92 (L) 101 - 111 mmol/L   BUN 43 (H) 6 - 20 mg/dL   Creatinine, Ser 8.11 (H) 0.44 - 1.00 mg/dL   Glucose, Bld 914 (H) 65 - 99 mg/dL   Calcium, Ion 7.82 (L) 1.13 - 1.30 mmol/L   TCO2 26 0 - 100 mmol/L   Hemoglobin 12.6 12.0 - 15.0 g/dL   HCT 95.6 21.3 - 08.6 %  CBG monitoring, ED  Result Value Ref Range   Glucose-Capillary 104 (H) 65 - 99 mg/dL  CBG monitoring, ED  Result Value Ref Range   Glucose-Capillary 119 (H) 65 - 99 mg/dL   No results found.   Given fosfomycin for UTI.  Sugars consistently normal here.  Stable for discharge with close follow up   I personally performed the services described in this documentation, which was scribed in my presence. The recorded information has been reviewed and is accurate.      Cy Blamer, MD 10/24/15 [phone]

## 2015-10-24 NOTE — ED Notes (Signed)
ptar has been notified for transport back to facility

## 2015-12-28 ENCOUNTER — Encounter (HOSPITAL_COMMUNITY): Payer: Self-pay

## 2015-12-28 ENCOUNTER — Emergency Department (HOSPITAL_COMMUNITY)
Admission: EM | Admit: 2015-12-28 | Discharge: 2015-12-28 | Disposition: A | Discharge status: Home / Self Care | Payer: Medicare Other | Attending: Emergency Medicine | Admitting: Emergency Medicine

## 2015-12-28 ENCOUNTER — Emergency Department (HOSPITAL_COMMUNITY): Payer: Medicare Other

## 2015-12-28 DIAGNOSIS — Z8701 Personal history of pneumonia (recurrent): Secondary | ICD-10-CM | POA: Diagnosis not present

## 2015-12-28 DIAGNOSIS — E039 Hypothyroidism, unspecified: Secondary | ICD-10-CM | POA: Diagnosis not present

## 2015-12-28 DIAGNOSIS — Z8673 Personal history of transient ischemic attack (TIA), and cerebral infarction without residual deficits: Secondary | ICD-10-CM | POA: Insufficient documentation

## 2015-12-28 DIAGNOSIS — Z79899 Other long term (current) drug therapy: Secondary | ICD-10-CM | POA: Diagnosis not present

## 2015-12-28 DIAGNOSIS — N182 Chronic kidney disease, stage 2 (mild): Secondary | ICD-10-CM | POA: Diagnosis not present

## 2015-12-28 DIAGNOSIS — K219 Gastro-esophageal reflux disease without esophagitis: Secondary | ICD-10-CM | POA: Diagnosis not present

## 2015-12-28 DIAGNOSIS — Z794 Long term (current) use of insulin: Secondary | ICD-10-CM | POA: Diagnosis not present

## 2015-12-28 DIAGNOSIS — I2510 Atherosclerotic heart disease of native coronary artery without angina pectoris: Secondary | ICD-10-CM | POA: Insufficient documentation

## 2015-12-28 DIAGNOSIS — E7800 Pure hypercholesterolemia, unspecified: Secondary | ICD-10-CM | POA: Insufficient documentation

## 2015-12-28 DIAGNOSIS — Z951 Presence of aortocoronary bypass graft: Secondary | ICD-10-CM | POA: Insufficient documentation

## 2015-12-28 DIAGNOSIS — E1122 Type 2 diabetes mellitus with diabetic chronic kidney disease: Secondary | ICD-10-CM | POA: Insufficient documentation

## 2015-12-28 DIAGNOSIS — I252 Old myocardial infarction: Secondary | ICD-10-CM | POA: Diagnosis not present

## 2015-12-28 DIAGNOSIS — I129 Hypertensive chronic kidney disease with stage 1 through stage 4 chronic kidney disease, or unspecified chronic kidney disease: Secondary | ICD-10-CM | POA: Diagnosis not present

## 2015-12-28 DIAGNOSIS — H409 Unspecified glaucoma: Secondary | ICD-10-CM | POA: Diagnosis not present

## 2015-12-28 DIAGNOSIS — I509 Heart failure, unspecified: Secondary | ICD-10-CM | POA: Diagnosis not present

## 2015-12-28 DIAGNOSIS — M47812 Spondylosis without myelopathy or radiculopathy, cervical region: Secondary | ICD-10-CM | POA: Insufficient documentation

## 2015-12-28 DIAGNOSIS — Z7902 Long term (current) use of antithrombotics/antiplatelets: Secondary | ICD-10-CM | POA: Insufficient documentation

## 2015-12-28 DIAGNOSIS — D649 Anemia, unspecified: Secondary | ICD-10-CM | POA: Diagnosis not present

## 2015-12-28 DIAGNOSIS — Z7952 Long term (current) use of systemic steroids: Secondary | ICD-10-CM | POA: Diagnosis not present

## 2015-12-28 DIAGNOSIS — M542 Cervicalgia: Secondary | ICD-10-CM | POA: Diagnosis present

## 2015-12-28 DIAGNOSIS — H540 Blindness, both eyes: Secondary | ICD-10-CM | POA: Insufficient documentation

## 2015-12-28 MED ORDER — OXYCODONE-ACETAMINOPHEN 5-325 MG PO TABS
1.0000 | ORAL_TABLET | Freq: Once | ORAL | Status: AC
  Administered 2015-12-28: 1 via ORAL
  Filled 2015-12-28: qty 1

## 2015-12-28 NOTE — ED Provider Notes (Signed)
CSN: [id number]     Arrival date & time 12/28/15  0940 History   First MD Initiated Contact with Patient 12/28/15 1141     Chief Complaint  Patient presents with  . Neck Pain     (Consider location/radiation/quality/duration/timing/severity/associated sxs/prior Treatment) HPI  Pt presenting with c/o neck pain which has been present for one week.  She states pain is in the back of her neck.  She denies any fall or injury.  Pain is worse when she moves her head side to side.  Pain is not on either side of neck.  No sore throat.  No fever.  No weakness in arms.  She takes oxycodone for pain at [facility] where she lives, she states this makes the pain feel improved.  There are no other associated systemic symptoms, there are no other alleviating or modifying factors.   Past Medical History  Diagnosis Date  . Hypertension   . CAD (coronary artery disease)   . 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 Macular edema     hx  . Glaucoma, both eyes   . High cholesterol   . CHF (congestive heart failure) (HCC)   . Chronic kidney disease (CKD), stage II (mild)     [name]/notes 10/07/2014  . Old myocardial infarct     "I was told I've had one; don't know when" (10/08/2014)  . Pneumonia 1990's X 1  . Hypothyroidism   . Type II diabetes mellitus (HCC)   . Anemia   . History of blood transfusion 2003    "related to [facility]"  . Stroke [facility](HCC)     "I've been told I've had one; I don't remember having any reaction from it at all"; denies residual on 10/08/2014  . Heart failure (HCC)   . GERD (gastroesophageal reflux disease)    Past Surgical History  Procedure Laterality Date  . Coronary artery bypass graft  01/2002    "CABG X4" at [place]  . Cataract extraction w/ intraocular lens  implant, bilateral Bilateral    Family History  Problem Relation Age of Onset  . CAD Neg Hx    Social History  Substance Use Topics  . Smoking status: Never Smoker   . Smokeless tobacco: Never Used  . Alcohol Use: No    OB History    No data available     Review of Systems  ROS reviewed and all otherwise negative except for mentioned in HPI    Allergies  Epinephrine  Home Medications   Prior to Admission medications   Medication Sig Start Date End Date Taking? Authorizing Provider  acetaminophen (TYLENOL) 500 MG tablet Take 500 mg by mouth every 4 (four) hours as needed for mild pain, fever or headache.   Yes Historical Provider, MD  ALPRAZolam (XANAX) 0.25 MG tablet Take 0.25 mg by mouth 2 (two) times daily.    Yes Historical Provider, MD  alum & mag hydroxide-simeth (MINTOX) 200-200-20 MG/5ML suspension Take 30 mLs by mouth as needed for indigestion or heartburn.   Yes Historical Provider, MD  amLODipine (NORVASC) 10 MG tablet Take 10 mg by mouth daily. 09/27/14  Yes Historical Provider, MD  atorvastatin (LIPITOR) 80 MG tablet Take 80 mg by mouth at bedtime. 09/21/14  Yes Historical Provider, MD  cloNIDine (CATAPRES) 0.1 MG tablet Take 0.1 mg by mouth 2 (two) times daily.  07/08/15  Yes Historical Provider, MD  clopidogrel (PLAVIX) 75 MG tablet Take 75 mg by mouth daily. 09/24/14  Yes Historical Provider,  MD  dorzolamide-timolol (COSOPT) 22.3-6.8 MG/ML ophthalmic solution Place 1 drop into both eyes every 12 (twelve) hours. 10/06/14  Yes Historical Provider, MD  ferrous sulfate 325 (65 FE) MG tablet Take 1 tablet (325 mg total) by mouth 2 (two) times daily with a meal. 03/24/15  Yes Sharee Holster, NP  furosemide (LASIX) 40 MG tablet Take 40 mg by mouth 2 (two) times daily. If weight is greater than 141 lbs please take an extra 40 mg of Lasix   Yes Historical Provider, MD  guaifenesin (ROBAFEN) 100 MG/5ML syrup Take 200 mg by mouth every 6 (six) hours as needed for cough.   Yes Historical Provider, MD  hydrALAZINE (APRESOLINE) 100 MG tablet Take 1 tablet (100 mg total) by mouth every 8 (eight) hours. 11/03/14  Yes Joseph Art, DO  insulin aspart (NOVOLOG) 100 UNIT/ML injection Inject 0-12 Units into the  skin 3 (three) times daily before meals. Inject 0 units if blood sugar is 0-150, 151-200=2 units, 201-250=4units, 251-300=6 units, 301-350=8units, 351-400=10units, 401-450=12units, 451 or greater = 12 units and call dr   Yes Historical Provider, MD  insulin glargine (LANTUS) 100 UNIT/ML injection Inject 16 Units into the skin at bedtime.    Yes Historical Provider, MD  isosorbide dinitrate (ISORDIL) 20 MG tablet Take 20 mg by mouth 3 (three) times daily.   Yes Historical Provider, MD  levothyroxine (SYNTHROID, LEVOTHROID) 100 MCG tablet Take 1 tablet (100 mcg total) by mouth daily before breakfast. 11/26/14  Yes Renae Fickle, MD  loperamide (IMODIUM) 2 MG capsule Take 2 mg by mouth as needed for diarrhea or loose stools.   Yes Historical Provider, MD  Melatonin 10 MG CAPS Take 10 mg by mouth daily.   Yes Historical Provider, MD  neomycin-bacitracin-polymyxin (NEOSPORIN) ointment Apply 1 application topically as directed. Apply to skin tear as needed until healed   Yes Historical Provider, MD  oxyCODONE-acetaminophen (PERCOCET/ROXICET) 5-325 MG per tablet Take 1 tablet by mouth every 4 (four) hours as needed for severe pain. Patient taking differently: Take 1 tablet by mouth 4 (four) times daily.  03/04/15  Yes Renae Fickle, MD  pantoprazole (PROTONIX) 20 MG tablet Take 20 mg by mouth daily.   Yes Historical Provider, MD  PARoxetine (PAXIL) 20 MG tablet Take 20 mg by mouth daily.   Yes Historical Provider, MD  polyvinyl alcohol (LIQUIFILM TEARS) 1.4 % ophthalmic solution Place 1 drop into the left eye 4 (four) times daily.   Yes Historical Provider, MD  prednisoLONE acetate (PRED FORTE) 1 % ophthalmic suspension Place 1 drop into the left eye 4 (four) times daily. 09/27/14  Yes Historical Provider, MD  QUEtiapine (SEROQUEL) 25 MG tablet Take 12.5 mg by mouth 2 (two) times daily.   Yes Historical Provider, MD  spironolactone (ALDACTONE) 25 MG tablet Take 0.5 tablets (12.5 mg total) by mouth daily.  07/14/15  Yes Dolores Patty, MD  traMADol (ULTRAM) 50 MG tablet Take 50 mg by mouth every 12 (twelve) hours as needed for moderate pain.  03/24/15  Yes Sharee Holster, NP  Vitamin D, Ergocalciferol, (DRISDOL) 50000 UNITS CAPS capsule Take 50,000 Units by mouth See admin instructions. On the 27th day of each month   Yes Historical Provider, MD   BP 131/87 mmHg  Pulse 58  Temp(Src) 98.6 F (37 C) (Oral)  Resp 16  SpO2 93%  Vitals reviewed Physical Exam  Physical Examination: General appearance - alert, well appearing, and in no distress Mental status - alert, oriented to person,  place, and time Eyes - no conjunctival injection, no scleral icterus Mouth - mucous membranes moist, pharynx normal without lesions Neck - midline tenderness to palpation over cervical spine, no paraspinal tenderness, pain with ROM Chest - clear to auscultation, no wheezes, rales or rhonchi, symmetric air entry Heart - normal rate, regular rhythm, normal S1, S2, no murmurs, rubs, clicks or gallops Abdomen - soft, nontender, nondistended, no masses or organomegaly Neurological - alert, oriented, normal speech, strength 5/5 in extremities x 4, pt is blind at baseline Extremities - peripheral pulses normal, no pedal edema, no clubbing or cyanosis Skin - normal coloration and turgor, no rashes  ED Course  Procedures (including critical care time) Labs Review Labs Reviewed - No data to display  Imaging Review Dg Cervical Spine Complete  12/28/2015  CLINICAL DATA:  Neck pain without known injury. EXAM: CERVICAL SPINE - COMPLETE 4+ VIEW COMPARISON:  CT scan of October 31, 2014. FINDINGS: Severe degenerative disc disease is noted at C5-6. Mild grade 1 retrolisthesis of C5-6 is noted with anterior and posterior osteophyte formation. Degenerative changes seen involving multiple left-sided posterior facet joints. No significant neural foraminal stenosis is noted. IMPRESSION: Severe degenerative disc disease is noted at  C5-6 with mild grade 1 retrolisthesis. No acute abnormality seen in the cervical spine. Electronically Signed   By: Lupita Raider, M.D.   On: 12/28/2015 13:09   I have personally reviewed and evaluated these images and lab results as part of my medical decision-making.   EKG Interpretation None      MDM   Final diagnoses:  Osteoarthritis of cervical spine   Pt presenting with c/o neck pain, she has not had any injury- pain is in the midline.  Xray shows osteoarthritis which is c/w her pain.  No focal neuro deficits.  Pt should continue her oxycodone and f/u with PMD.  Discharged with strict return precautions.  Pt agreeable with plan.    Jerelyn Scott, MD 12/29/15 [phone]

## 2015-12-28 NOTE — ED Notes (Signed)
Per EMS, pt from [facility].  Pt c/o neck pain.  No injury.  Pt is alert to self and events but not to time per baseline.  Pt is blind with 2 assist.  No swallowing issues.  Pain with side to side movement.  Vitals:  142/68, hr 68, resp 16, cbg 240

## 2015-12-28 NOTE — ED Notes (Signed)
PT DISCHARGED. INSTRUCTIONS GIVEN TO PTAR STAFF. AAOX1. PT IN NO APPARENT DISTRESS OR PAIN. THE OPPORTUNITY TO ASK QUESTIONS WAS PROVIDED. 

## 2015-12-28 NOTE — Discharge Instructions (Signed)
Return to the ED with any concerns including weakness of arms, changes in vision or speech, or any other alarming symptoms

## 2015-12-28 NOTE — ED Notes (Signed)
Bed: WA03 Expected date:  Expected time:  Means of arrival:  Comments: EMS 

## 2016-01-15 ENCOUNTER — Inpatient Hospital Stay (HOSPITAL_COMMUNITY): Payer: Medicare Other

## 2016-01-15 ENCOUNTER — Inpatient Hospital Stay (HOSPITAL_COMMUNITY)
Admission: EM | Admit: 2016-01-15 | Discharge: 2016-01-21 | DRG: 682 | Disposition: A | Discharge status: Skilled Nursing Facility | Payer: Medicare Other | Attending: Internal Medicine | Admitting: Internal Medicine

## 2016-01-15 ENCOUNTER — Encounter (HOSPITAL_COMMUNITY): Payer: Self-pay | Admitting: Emergency Medicine

## 2016-01-15 DIAGNOSIS — D72829 Elevated white blood cell count, unspecified: Secondary | ICD-10-CM

## 2016-01-15 DIAGNOSIS — K219 Gastro-esophageal reflux disease without esophagitis: Secondary | ICD-10-CM | POA: Diagnosis present

## 2016-01-15 DIAGNOSIS — F0390 Unspecified dementia without behavioral disturbance: Secondary | ICD-10-CM | POA: Diagnosis present

## 2016-01-15 DIAGNOSIS — E039 Hypothyroidism, unspecified: Secondary | ICD-10-CM | POA: Diagnosis present

## 2016-01-15 DIAGNOSIS — I5032 Chronic diastolic (congestive) heart failure: Secondary | ICD-10-CM | POA: Diagnosis present

## 2016-01-15 DIAGNOSIS — N179 Acute kidney failure, unspecified: Secondary | ICD-10-CM | POA: Diagnosis present

## 2016-01-15 DIAGNOSIS — D649 Anemia, unspecified: Secondary | ICD-10-CM

## 2016-01-15 DIAGNOSIS — Z79891 Long term (current) use of opiate analgesic: Secondary | ICD-10-CM | POA: Diagnosis not present

## 2016-01-15 DIAGNOSIS — E7800 Pure hypercholesterolemia, unspecified: Secondary | ICD-10-CM | POA: Diagnosis present

## 2016-01-15 DIAGNOSIS — R630 Anorexia: Secondary | ICD-10-CM | POA: Diagnosis present

## 2016-01-15 DIAGNOSIS — I2510 Atherosclerotic heart disease of native coronary artery without angina pectoris: Secondary | ICD-10-CM | POA: Diagnosis present

## 2016-01-15 DIAGNOSIS — Z951 Presence of aortocoronary bypass graft: Secondary | ICD-10-CM

## 2016-01-15 DIAGNOSIS — E1165 Type 2 diabetes mellitus with hyperglycemia: Secondary | ICD-10-CM | POA: Diagnosis present

## 2016-01-15 DIAGNOSIS — Z7902 Long term (current) use of antithrombotics/antiplatelets: Secondary | ICD-10-CM

## 2016-01-15 DIAGNOSIS — Z794 Long term (current) use of insulin: Secondary | ICD-10-CM | POA: Diagnosis not present

## 2016-01-15 DIAGNOSIS — E118 Type 2 diabetes mellitus with unspecified complications: Secondary | ICD-10-CM | POA: Diagnosis not present

## 2016-01-15 DIAGNOSIS — I130 Hypertensive heart and chronic kidney disease with heart failure and stage 1 through stage 4 chronic kidney disease, or unspecified chronic kidney disease: Secondary | ICD-10-CM | POA: Diagnosis present

## 2016-01-15 DIAGNOSIS — E869 Volume depletion, unspecified: Secondary | ICD-10-CM | POA: Diagnosis present

## 2016-01-15 DIAGNOSIS — G9340 Encephalopathy, unspecified: Secondary | ICD-10-CM | POA: Diagnosis present

## 2016-01-15 DIAGNOSIS — R627 Adult failure to thrive: Secondary | ICD-10-CM | POA: Diagnosis present

## 2016-01-15 DIAGNOSIS — E876 Hypokalemia: Secondary | ICD-10-CM | POA: Diagnosis present

## 2016-01-15 DIAGNOSIS — N390 Urinary tract infection, site not specified: Secondary | ICD-10-CM | POA: Diagnosis present

## 2016-01-15 DIAGNOSIS — E1122 Type 2 diabetes mellitus with diabetic chronic kidney disease: Secondary | ICD-10-CM | POA: Diagnosis present

## 2016-01-15 DIAGNOSIS — H409 Unspecified glaucoma: Secondary | ICD-10-CM | POA: Diagnosis present

## 2016-01-15 DIAGNOSIS — N19 Unspecified kidney failure: Secondary | ICD-10-CM

## 2016-01-15 DIAGNOSIS — N184 Chronic kidney disease, stage 4 (severe): Secondary | ICD-10-CM | POA: Diagnosis present

## 2016-01-15 DIAGNOSIS — R5383 Other fatigue: Secondary | ICD-10-CM

## 2016-01-15 DIAGNOSIS — N3000 Acute cystitis without hematuria: Secondary | ICD-10-CM

## 2016-01-15 DIAGNOSIS — Z8673 Personal history of transient ischemic attack (TIA), and cerebral infarction without residual deficits: Secondary | ICD-10-CM

## 2016-01-15 DIAGNOSIS — Z79899 Other long term (current) drug therapy: Secondary | ICD-10-CM | POA: Diagnosis not present

## 2016-01-15 DIAGNOSIS — R195 Other fecal abnormalities: Secondary | ICD-10-CM | POA: Diagnosis present

## 2016-01-15 DIAGNOSIS — I10 Essential (primary) hypertension: Secondary | ICD-10-CM | POA: Diagnosis present

## 2016-01-15 DIAGNOSIS — E1121 Type 2 diabetes mellitus with diabetic nephropathy: Secondary | ICD-10-CM | POA: Diagnosis present

## 2016-01-15 DIAGNOSIS — Z888 Allergy status to other drugs, medicaments and biological substances status: Secondary | ICD-10-CM

## 2016-01-15 LAB — URINALYSIS, ROUTINE W REFLEX MICROSCOPIC
Bilirubin Urine: NEGATIVE
Glucose, UA: 250 mg/dL — AB
Ketones, ur: NEGATIVE mg/dL
NITRITE: NEGATIVE
PH: 5 (ref 5.0–8.0)
Protein, ur: 100 mg/dL — AB
SPECIFIC GRAVITY, URINE: 1.015 (ref 1.005–1.030)

## 2016-01-15 LAB — URINE MICROSCOPIC-ADD ON

## 2016-01-15 LAB — BRAIN NATRIURETIC PEPTIDE: B NATRIURETIC PEPTIDE 5: 169.8 pg/mL — AB (ref 0.0–100.0)

## 2016-01-15 LAB — CBC
HEMATOCRIT: 30.3 % — AB (ref 36.0–46.0)
HEMOGLOBIN: 9.9 g/dL — AB (ref 12.0–15.0)
MCH: 29.6 pg (ref 26.0–34.0)
MCHC: 32.7 g/dL (ref 30.0–36.0)
MCV: 90.7 fL (ref 78.0–100.0)
PLATELETS: 439 10*3/uL — AB (ref 150–400)
RBC: 3.34 MIL/uL — AB (ref 3.87–5.11)
RDW: 13.7 % (ref 11.5–15.5)
WBC: 21.3 10*3/uL — AB (ref 4.0–10.5)

## 2016-01-15 LAB — I-STAT TROPONIN, ED: Troponin i, poc: 0 ng/mL (ref 0.00–0.08)

## 2016-01-15 LAB — DIFFERENTIAL
BASOS ABS: 0 10*3/uL (ref 0.0–0.1)
BASOS PCT: 0 %
Eosinophils Absolute: 0.1 10*3/uL (ref 0.0–0.7)
Eosinophils Relative: 1 %
Lymphocytes Relative: 3 %
Lymphs Abs: 0.6 10*3/uL — ABNORMAL LOW (ref 0.7–4.0)
MONO ABS: 0.9 10*3/uL (ref 0.1–1.0)
Monocytes Relative: 5 %
NEUTROS ABS: 19 10*3/uL — AB (ref 1.7–7.7)
NEUTROS PCT: 91 %

## 2016-01-15 LAB — BASIC METABOLIC PANEL
ANION GAP: 19 — AB (ref 5–15)
BUN: 92 mg/dL — ABNORMAL HIGH (ref 6–20)
CHLORIDE: 93 mmol/L — AB (ref 101–111)
CO2: 23 mmol/L (ref 22–32)
Calcium: 9.4 mg/dL (ref 8.9–10.3)
Creatinine, Ser: 4.79 mg/dL — ABNORMAL HIGH (ref 0.44–1.00)
GFR calc non Af Amer: 8 mL/min — ABNORMAL LOW (ref >60)
GFR, EST AFRICAN AMERICAN: 10 mL/min — AB (ref >60)
GLUCOSE: 305 mg/dL — AB (ref 65–99)
Potassium: 3.9 mmol/L (ref 3.5–5.1)
Sodium: 135 mmol/L (ref 135–145)

## 2016-01-15 LAB — CBG MONITORING, ED: Glucose-Capillary: 290 mg/dL — ABNORMAL HIGH (ref 65–99)

## 2016-01-15 LAB — I-STAT CG4 LACTIC ACID, ED: Lactic Acid, Venous: 1.07 mmol/L (ref 0.5–2.0)

## 2016-01-15 MED ORDER — ACETAMINOPHEN 650 MG RE SUPP
650.0000 mg | Freq: Four times a day (QID) | RECTAL | Status: DC | PRN
Start: 2016-01-15 — End: 2016-01-21

## 2016-01-15 MED ORDER — PANTOPRAZOLE SODIUM 20 MG PO TBEC
20.0000 mg | DELAYED_RELEASE_TABLET | Freq: Every day | ORAL | Status: DC
  Administered 2016-01-16 – 2016-01-21 (×5): 20 mg via ORAL
  Filled 2016-01-15 (×6): qty 1

## 2016-01-15 MED ORDER — ONDANSETRON HCL 4 MG/2ML IJ SOLN: 4.0000 mg | Freq: Four times a day (QID) | INTRAMUSCULAR | Status: DC | PRN

## 2016-01-15 MED ORDER — INSULIN ASPART 100 UNIT/ML ~~LOC~~ SOLN
0.0000 [IU] | Freq: Three times a day (TID) | SUBCUTANEOUS | Status: DC
Start: 2016-01-16 — End: 2016-01-21
  Administered 2016-01-16: 3 [IU] via SUBCUTANEOUS
  Administered 2016-01-16: 5 [IU] via SUBCUTANEOUS
  Administered 2016-01-17: 1 [IU] via SUBCUTANEOUS
  Administered 2016-01-17: 2 [IU] via SUBCUTANEOUS
  Administered 2016-01-19 – 2016-01-20 (×4): 1 [IU] via SUBCUTANEOUS
  Administered 2016-01-20: 2 [IU] via SUBCUTANEOUS
  Administered 2016-01-20: 1 [IU] via SUBCUTANEOUS
  Administered 2016-01-21: 2 [IU] via SUBCUTANEOUS
  Administered 2016-01-21: 1 [IU] via SUBCUTANEOUS

## 2016-01-15 MED ORDER — CLONIDINE HCL 0.1 MG PO TABS
0.1000 mg | ORAL_TABLET | Freq: Two times a day (BID) | ORAL | Status: DC
  Administered 2016-01-16 – 2016-01-21 (×10): 0.1 mg via ORAL
  Filled 2016-01-15 (×12): qty 1

## 2016-01-15 MED ORDER — INSULIN GLARGINE 100 UNIT/ML ~~LOC~~ SOLN
16.0000 [IU] | Freq: Every day | SUBCUTANEOUS | Status: DC
  Administered 2016-01-16: 16 [IU] via SUBCUTANEOUS
  Filled 2016-01-15: qty 0.16

## 2016-01-15 MED ORDER — CLOPIDOGREL BISULFATE 75 MG PO TABS
75.0000 mg | ORAL_TABLET | Freq: Every day | ORAL | Status: DC
  Administered 2016-01-16 – 2016-01-21 (×5): 75 mg via ORAL
  Filled 2016-01-15 (×6): qty 1

## 2016-01-15 MED ORDER — SODIUM CHLORIDE 0.9 % IV SOLN
Freq: Once | INTRAVENOUS | Status: AC
  Administered 2016-01-15: 23:00:00 via INTRAVENOUS

## 2016-01-15 MED ORDER — ATORVASTATIN CALCIUM 80 MG PO TABS
80.0000 mg | ORAL_TABLET | Freq: Every day | ORAL | Status: DC
  Administered 2016-01-16 – 2016-01-20 (×5): 80 mg via ORAL
  Filled 2016-01-15 (×6): qty 1

## 2016-01-15 MED ORDER — ALPRAZOLAM 0.25 MG PO TABS
0.2500 mg | ORAL_TABLET | Freq: Two times a day (BID) | ORAL | Status: DC
  Administered 2016-01-16 – 2016-01-17 (×4): 0.25 mg via ORAL
  Filled 2016-01-15 (×3): qty 1

## 2016-01-15 MED ORDER — FERROUS SULFATE 325 (65 FE) MG PO TABS
325.0000 mg | ORAL_TABLET | Freq: Two times a day (BID) | ORAL | Status: DC
  Administered 2016-01-16 – 2016-01-21 (×11): 325 mg via ORAL
  Filled 2016-01-15 (×13): qty 1

## 2016-01-15 MED ORDER — INSULIN ASPART 100 UNIT/ML ~~LOC~~ SOLN
0.0000 [IU] | Freq: Every day | SUBCUTANEOUS | Status: DC
  Administered 2016-01-16: 4 [IU] via SUBCUTANEOUS

## 2016-01-15 MED ORDER — DORZOLAMIDE HCL-TIMOLOL MAL 2-0.5 % OP SOLN
1.0000 [drp] | Freq: Two times a day (BID) | OPHTHALMIC | Status: DC
  Administered 2016-01-16 – 2016-01-21 (×11): 1 [drp] via OPHTHALMIC
  Filled 2016-01-15: qty 10

## 2016-01-15 MED ORDER — ONDANSETRON HCL 4 MG PO TABS: 4.0000 mg | ORAL_TABLET | Freq: Four times a day (QID) | ORAL | Status: DC | PRN

## 2016-01-15 MED ORDER — SODIUM CHLORIDE 0.9 % IV SOLN
INTRAVENOUS | Status: DC
  Administered 2016-01-16 – 2016-01-20 (×9): via INTRAVENOUS

## 2016-01-15 MED ORDER — HYDROMORPHONE HCL 1 MG/ML IJ SOLN: 0.5000 mg | INTRAMUSCULAR | Status: DC | PRN

## 2016-01-15 MED ORDER — PREDNISOLONE ACETATE 1 % OP SUSP
1.0000 [drp] | Freq: Four times a day (QID) | OPHTHALMIC | Status: DC
  Administered 2016-01-16 – 2016-01-21 (×22): 1 [drp] via OPHTHALMIC
  Filled 2016-01-15: qty 1

## 2016-01-15 MED ORDER — LEVOTHYROXINE SODIUM 100 MCG PO TABS
100.0000 ug | ORAL_TABLET | Freq: Every day | ORAL | Status: DC
  Administered 2016-01-16 – 2016-01-18 (×3): 100 ug via ORAL
  Filled 2016-01-15 (×4): qty 1

## 2016-01-15 MED ORDER — CEFTRIAXONE SODIUM 1 G IJ SOLR
1.0000 g | Freq: Once | INTRAMUSCULAR | Status: AC
  Administered 2016-01-15: 1 g via INTRAVENOUS
  Filled 2016-01-15: qty 10

## 2016-01-15 MED ORDER — HEPARIN SODIUM (PORCINE) 5000 UNIT/ML IJ SOLN
5000.0000 [IU] | Freq: Three times a day (TID) | INTRAMUSCULAR | Status: DC
  Administered 2016-01-16 – 2016-01-21 (×17): 5000 [IU] via SUBCUTANEOUS
  Filled 2016-01-15 (×20): qty 1

## 2016-01-15 MED ORDER — HYDRALAZINE HCL 50 MG PO TABS
100.0000 mg | ORAL_TABLET | Freq: Three times a day (TID) | ORAL | Status: DC
  Administered 2016-01-16 – 2016-01-18 (×6): 100 mg via ORAL
  Filled 2016-01-15 (×10): qty 2

## 2016-01-15 MED ORDER — POLYVINYL ALCOHOL 1.4 % OP SOLN
1.0000 [drp] | Freq: Four times a day (QID) | OPHTHALMIC | Status: DC
  Administered 2016-01-16 – 2016-01-21 (×22): 1 [drp] via OPHTHALMIC
  Filled 2016-01-15: qty 15

## 2016-01-15 MED ORDER — QUETIAPINE 12.5 MG HALF TABLET
12.5000 mg | ORAL_TABLET | Freq: Two times a day (BID) | ORAL | Status: DC
  Administered 2016-01-16 – 2016-01-17 (×4): 12.5 mg via ORAL
  Filled 2016-01-15 (×6): qty 1

## 2016-01-15 MED ORDER — MELATONIN 10 MG PO CAPS: 10.0000 mg | ORAL_CAPSULE | Freq: Every day | ORAL | Status: DC

## 2016-01-15 MED ORDER — ACETAMINOPHEN 325 MG PO TABS
650.0000 mg | ORAL_TABLET | Freq: Four times a day (QID) | ORAL | Status: DC | PRN
  Administered 2016-01-16: 650 mg via ORAL
  Filled 2016-01-15: qty 2

## 2016-01-15 MED ORDER — SODIUM CHLORIDE 0.9 % IV BOLUS (SEPSIS)
1000.0000 mL | Freq: Once | INTRAVENOUS | Status: AC
  Administered 2016-01-15: 1000 mL via INTRAVENOUS

## 2016-01-15 MED ORDER — AMLODIPINE BESYLATE 10 MG PO TABS
10.0000 mg | ORAL_TABLET | Freq: Every day | ORAL | Status: DC
  Administered 2016-01-16: 10 mg via ORAL
  Filled 2016-01-15: qty 1

## 2016-01-15 MED ORDER — OXYCODONE HCL 5 MG PO TABS: 5.0000 mg | ORAL_TABLET | ORAL | Status: DC | PRN

## 2016-01-15 MED ORDER — ISOSORBIDE DINITRATE 20 MG PO TABS
20.0000 mg | ORAL_TABLET | Freq: Three times a day (TID) | ORAL | Status: DC
  Administered 2016-01-16 – 2016-01-21 (×13): 20 mg via ORAL
  Filled 2016-01-15 (×18): qty 1

## 2016-01-15 MED ORDER — PAROXETINE HCL 20 MG PO TABS
20.0000 mg | ORAL_TABLET | Freq: Every day | ORAL | Status: DC
  Administered 2016-01-16 – 2016-01-17 (×2): 20 mg via ORAL
  Filled 2016-01-15 (×3): qty 1

## 2016-01-15 MED ORDER — DEXTROSE 5 % IV SOLN
1.0000 g | INTRAVENOUS | Status: DC
  Administered 2016-01-16 – 2016-01-18 (×3): 1 g via INTRAVENOUS
  Filled 2016-01-15 (×3): qty 10

## 2016-01-15 NOTE — H&P (Signed)
[facility] Admission History and Physical       Taylor Padilla ZOX:[id number] DOB: 1946-01-11 DOA: 01/15/2016  Referring physician: EDP PCP: No PCP Per Patient  Specialists:   Chief Complaint: Weakness  HPI: Taylor Padilla is a 70 y.o. female with a history of CAD, Diastolic CHF, HTN, DM2 who was sent to the ED from the [facility] due to increased weakness and poor intake of foods and liquids for the past 4 days.    She was found to have an elevated BUN/Cr of 92/4.79 and a positive UA.   A Urine C+S was sent and She was administered IV Rocephin and referred for admission.       Review of Systems:  Constitutional: No Weight Loss, No Weight Gain, Night Sweats, Fevers, Chills, Dizziness, Light Headedness, Fatigue, or Generalized Weakness HEENT: No Headaches, Difficulty Swallowing,Tooth/Dental Problems,Sore Throat,  No Sneezing, Rhinitis, Ear Ache, Nasal Congestion, or Post Nasal Drip,  Cardio-vascular:  No Chest pain, Orthopnea, PND, Edema in Lower Extremities, Anasarca, Dizziness, Palpitations  Resp: No Dyspnea, No DOE, No Productive Cough, No Non-Productive Cough, No Hemoptysis, No Wheezing.    GI: No Heartburn, Indigestion, Abdominal Pain, Nausea, Vomiting, Diarrhea, Constipation, Hematemesis, Hematochezia, Melena, Change in Bowel Habits,  +Loss of Appetite  GU: No Dysuria, No Change in Color of Urine, No Urgency or Urinary Frequency, No Flank pain.  Musculoskeletal: No Joint Pain or Swelling, No Decreased Range of Motion, No Back Pain.  Neurologic: No Syncope, No Seizures, Muscle Weakness, Paresthesia, Vision Disturbance or Loss, No Diplopia, No Vertigo, No Difficulty Walking,  Skin: No Rash or Lesions. Psych: No Change in Mood or Affect, No Depression or Anxiety, No Memory loss, No Confusion, or Hallucinations   Past Medical History  Diagnosis Date  . Hypertension   . CAD (coronary artery disease)   . 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  Macular edema     hx  . Glaucoma, both eyes   . High cholesterol   . CHF (congestive heart failure) (HCC)   . Chronic kidney disease (CKD), stage II (mild)     [name] 10/07/2014  . Old myocardial infarct     "I was told I've had one; don't know when" (10/08/2014)  . Pneumonia 1990's X 1  . Hypothyroidism   . Type II diabetes mellitus (HCC)   . Anemia   . History of blood transfusion 2003    "related to OHS"  . Stroke [facility])     "I've been told I've had one; I don't remember having any reaction from it at all"; denies residual on 10/08/2014  . Heart failure (HCC)   . GERD (gastroesophageal reflux disease)      Past Surgical History  Procedure Laterality Date  . Coronary artery bypass graft  01/2002    "CABG X4" at [place]  . Cataract extraction w/ intraocular lens  implant, bilateral Bilateral       Prior to Admission medications   Medication Sig Start Date End Date Taking? Authorizing Provider  acetaminophen (TYLENOL) 500 MG tablet Take 500 mg by mouth every 4 (four) hours as needed for mild pain, fever or headache.    Historical Provider, MD  ALPRAZolam Prudy Feeler) 0.25 MG tablet Take 0.25 mg by mouth 2 (two) times daily.     Historical Provider, MD  alum & mag hydroxide-simeth (MINTOX) 200-200-20 MG/5ML suspension Take 30 mLs by mouth as needed for indigestion or heartburn.    Historical Provider, MD  amLODipine (NORVASC) 10  MG tablet Take 10 mg by mouth daily. 09/27/14   Historical Provider, MD  atorvastatin (LIPITOR) 80 MG tablet Take 80 mg by mouth at bedtime. 09/21/14   Historical Provider, MD  cloNIDine (CATAPRES) 0.1 MG tablet Take 0.1 mg by mouth 2 (two) times daily.  07/08/15   Historical Provider, MD  clopidogrel (PLAVIX) 75 MG tablet Take 75 mg by mouth daily. 09/24/14   Historical Provider, MD  dorzolamide-timolol (COSOPT) 22.3-6.8 MG/ML ophthalmic solution Place 1 drop into both eyes every 12 (twelve) hours. 10/06/14   Historical Provider, MD  ferrous sulfate 325 (65 FE) MG  tablet Take 1 tablet (325 mg total) by mouth 2 (two) times daily with a meal. 03/24/15   Sharee Holster, NP  furosemide (LASIX) 40 MG tablet Take 40 mg by mouth 2 (two) times daily. If weight is greater than 141 lbs please take an extra 40 mg of Lasix    Historical Provider, MD  guaifenesin (ROBAFEN) 100 MG/5ML syrup Take 200 mg by mouth every 6 (six) hours as needed for cough.    Historical Provider, MD  hydrALAZINE (APRESOLINE) 100 MG tablet Take 1 tablet (100 mg total) by mouth every 8 (eight) hours. 11/03/14   Joseph Art, DO  insulin aspart (NOVOLOG) 100 UNIT/ML injection Inject 0-12 Units into the skin 3 (three) times daily before meals. Inject 0 units if blood sugar is 0-150, 151-200=2 units, 201-250=4units, 251-300=6 units, 301-350=8units, 351-400=10units, 401-450=12units, 451 or greater = 12 units and call dr    Historical Provider, MD  insulin glargine (LANTUS) 100 UNIT/ML injection Inject 16 Units into the skin at bedtime.     Historical Provider, MD  isosorbide dinitrate (ISORDIL) 20 MG tablet Take 20 mg by mouth 3 (three) times daily.    Historical Provider, MD  levothyroxine (SYNTHROID, LEVOTHROID) 100 MCG tablet Take 1 tablet (100 mcg total) by mouth daily before breakfast. 11/26/14   Renae Fickle, MD  loperamide (IMODIUM) 2 MG capsule Take 2 mg by mouth as needed for diarrhea or loose stools.    Historical Provider, MD  Melatonin 10 MG CAPS Take 10 mg by mouth daily.    Historical Provider, MD  neomycin-bacitracin-polymyxin (NEOSPORIN) ointment Apply 1 application topically as directed. Apply to skin tear as needed until healed    Historical Provider, MD  oxyCODONE-acetaminophen (PERCOCET/ROXICET) 5-325 MG per tablet Take 1 tablet by mouth every 4 (four) hours as needed for severe pain. Patient taking differently: Take 1 tablet by mouth 4 (four) times daily.  03/04/15   Renae Fickle, MD  pantoprazole (PROTONIX) 20 MG tablet Take 20 mg by mouth daily.    Historical Provider, MD    PARoxetine (PAXIL) 20 MG tablet Take 20 mg by mouth daily.    Historical Provider, MD  polyvinyl alcohol (LIQUIFILM TEARS) 1.4 % ophthalmic solution Place 1 drop into the left eye 4 (four) times daily.    Historical Provider, MD  prednisoLONE acetate (PRED FORTE) 1 % ophthalmic suspension Place 1 drop into the left eye 4 (four) times daily. 09/27/14   Historical Provider, MD  QUEtiapine (SEROQUEL) 25 MG tablet Take 12.5 mg by mouth 2 (two) times daily.    Historical Provider, MD  spironolactone (ALDACTONE) 25 MG tablet Take 0.5 tablets (12.5 mg total) by mouth daily. 07/14/15   Dolores Patty, MD  traMADol (ULTRAM) 50 MG tablet Take 50 mg by mouth every 12 (twelve) hours as needed for moderate pain.  03/24/15   Sharee Holster, NP  Vitamin D,  Ergocalciferol, (DRISDOL) 50000 UNITS CAPS capsule Take 50,000 Units by mouth See admin instructions. On the 27th day of each month    Historical Provider, MD     Allergies  Allergen Reactions  . Epinephrine Other (See Comments)    Patient says she is not allergic to epi, but that it makes her jittery.  WILL RECEIVE IF NEEDED IN AN EMERGENCY.    Social History:  reports that she has never smoked. She has never used smokeless tobacco. She reports that she does not drink alcohol or use illicit drugs.    Family History  Problem Relation Age of Onset  . CAD Neg Hx        Physical Exam:  GEN:  Pleasant Obese Elderly 70 y.o. Caucasian female examined and in no acute distress; cooperative with exam Filed Vitals:   01/15/16 1954 01/15/16 2000 01/15/16 2129 01/15/16 2215  BP: 135/60 142/70 155/73 138/66  Pulse: 86 92 92 84  Temp: 98.7 F (37.1 C)  98.3 F (36.8 C)   TempSrc: Oral  Rectal   Resp: 19 11 19 13   SpO2: 97% 98% 98% 96%   Blood pressure 138/66, pulse 84, temperature 98.3 F (36.8 C), temperature source Rectal, resp. rate 13, SpO2 96 %. PSYCH: SHe is alert and oriented x4; does not appear anxious does not appear depressed; affect is  normal HEENT: Normocephalic and Atraumatic, Mucous membranes pink; PERRLA; EOM intact; Fundi:  Benign;  No scleral icterus, Nares: Patent, Oropharynx: Clear,     Neck:  FROM, No Cervical Lymphadenopathy nor Thyromegaly or Carotid Bruit; No JVD; Breasts:: Not examined CHEST WALL: No tenderness CHEST: Normal respiration, clear to auscultation bilaterally HEART: Regular rate and rhythm; no murmurs rubs or gallops BACK: No kyphosis or scoliosis; No CVA tenderness ABDOMEN: Positive Bowel Sounds, Soft Non-Tender, No Rebound or Guarding; No Masses, No Organomegaly, No Pannus; No Intertriginous candida. Rectal Exam: Not done EXTREMITIES: No Cyanosis, Clubbing, or Edema; No Ulcerations. Genitalia: not examined PULSES: 2+ and symmetric SKIN: Normal hydration no rash or ulceration CNS:  Alert and Oriented x 4, No Focal Deficits Vascular: pulses palpable throughout    Labs on Admission:  Basic Metabolic Panel:  Recent Labs Lab 01/15/16 2013  NA 135  K 3.9  CL 93*  CO2 23  GLUCOSE 305*  BUN 92*  CREATININE 4.79*  CALCIUM 9.4   Liver Function Tests: No results for input(s): AST, ALT, ALKPHOS, BILITOT, PROT, ALBUMIN in the last 168 hours. No results for input(s): LIPASE, AMYLASE in the last 168 hours. No results for input(s): AMMONIA in the last 168 hours. CBC:  Recent Labs Lab 01/15/16 2033  WBC 21.3*  NEUTROABS 19.0*  HGB 9.9*  HCT 30.3*  MCV 90.7  PLT 439*   Cardiac Enzymes: No results for input(s): CKTOTAL, CKMB, CKMBINDEX, TROPONINI in the last 168 hours.  BNP (last 3 results)  Recent Labs  07/14/15 1308 01/15/16 2033  BNP 1431.8* 169.8*    ProBNP (last 3 results) No results for input(s): PROBNP in the last 8760 hours.  CBG:   Radiological Exams on Admission: No results found.   EKG: Independently reviewed.    Assessment/Plan:      70 y.o. female with  Principal Problem:    Acute kidney injury (HCC)   Hold Lasix and Spironolactone Rx   Avoid  Nephrotoxins   IVFs   Monitor BUN/Cr   Active Problems:    UTI (urinary tract infection)   Urine C+S sent   IV Rocephin  CAD (coronary artery disease)   stable      CKD (chronic kidney disease) stage 4, GFR 15-29 ml/min (HCC)   IVFs   Monitor BUN/Cr    Chronic diastolic heart failure (HCC)   Monitor I/Os        Essential hypertension   Continue Amlodipine, and Clonidine Rx   Monitor BPs       Diabetes mellitus type II, uncontrolled (HCC)   Continue Lantus Insulin Rx   SSI coverage PRN    Anemia   Monitor Trend    DVT Prophylaxis   SQ Heparin      Code Status:     FULL CODE        Family Communication:    No Family Present    Disposition Plan:    Inpatient Status        Time spent:  59 Minutes      Ron Parker [facility] Pager [phone]   If 7AM -7PM Please Contact the Day Rounding Team MD for [facility]  If 7PM-7AM, Please Contact Night-Floor Coverage  [email or web address] Password [email or web address] 01/15/2016, 10:52 PM     ADDENDUM:   Patient was seen and examined on 01/15/2016

## 2016-01-15 NOTE — ED Notes (Addendum)
Per EMS, pt[facility] reported  Of weakness which started 4 days ago upon diagnosed with UTI, pt. Has been on antibiotic for 4 days, now also noted to have poor intake. Denies N/V. Denies SOB. Denies pain. Alert and oriented x2.  cbg 303mg /dl.,

## 2016-01-15 NOTE — ED Provider Notes (Signed)
CSN: [id number]     Arrival date & time 01/15/16  1923 History   First MD Initiated Contact with Patient 01/15/16 2019     Chief Complaint  Patient presents with  . Weakness     (Consider location/radiation/quality/duration/timing/severity/associated sxs/prior Treatment) HPI [name] is a 70 y.o. female with history of CVA, anemia, diabetes, CHF, coronary disease, hypertension, dementia, nonambulatory at baseline, presents to emergency department from nursing home with complaint of progressive weakness, anorexia, swelling in bilateral legs. Patient apparently diagnosed with UTI and was started on Cipro on 01/11/16, which she is still taking. Patient has not been complaining of anything. Patient is confused at baseline. She has not had any fever, no cough, no URI symptoms, no vomiting, no diarrhea.  History obtained from EMS and from a nurse at the [facility] skilled nursing facility who is familiar with patient.  Past Medical History  Diagnosis Date  . Hypertension   . CAD (coronary artery disease)   . 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 Macular edema     hx  . Glaucoma, both eyes   . High cholesterol   . CHF (congestive heart failure) (HCC)   . Chronic kidney disease (CKD), stage II (mild)     [name] 10/07/2014  . Old myocardial infarct     "I was told I've had one; don't know when" (10/08/2014)  . Pneumonia 1990's X 1  . Hypothyroidism   . Type II diabetes mellitus (HCC)   . Anemia   . History of blood transfusion 2003    "related to [facility]"  . Stroke [facility])     "I've been told I've had one; I don't remember having any reaction from it at all"; denies residual on 10/08/2014  . Heart failure (HCC)   . GERD (gastroesophageal reflux disease)    Past Surgical History  Procedure Laterality Date  . Coronary artery bypass graft  01/2002    "CABG X4" at [place]  . Cataract extraction w/ intraocular lens  implant, bilateral Bilateral    Family History  Problem Relation Age of  Onset  . CAD Neg Hx    Social History  Substance Use Topics  . Smoking status: Never Smoker   . Smokeless tobacco: Never Used  . Alcohol Use: No   OB History    No data available     Review of Systems  Constitutional: Negative for fever and chills.  Respiratory: Negative for cough, chest tightness and shortness of breath.   Cardiovascular: Positive for leg swelling. Negative for chest pain and palpitations.  Gastrointestinal: Negative for nausea, vomiting, abdominal pain and diarrhea.  Musculoskeletal: Negative for myalgias, arthralgias, neck pain and neck stiffness.  Skin: Negative for rash.  Neurological: Positive for weakness. Negative for dizziness and headaches.  All other systems reviewed and are negative.     Allergies  Epinephrine  Home Medications   Prior to Admission medications   Medication Sig Start Date End Date Taking? Authorizing Provider  acetaminophen (TYLENOL) 500 MG tablet Take 500 mg by mouth every 4 (four) hours as needed for mild pain, fever or headache.    Historical Provider, MD  ALPRAZolam Prudy Feeler) 0.25 MG tablet Take 0.25 mg by mouth 2 (two) times daily.     Historical Provider, MD  alum & mag hydroxide-simeth (MINTOX) 200-200-20 MG/5ML suspension Take 30 mLs by mouth as needed for indigestion or heartburn.    Historical Provider, MD  amLODipine (NORVASC) 10 MG tablet Take 10 mg by mouth daily.  09/27/14   Historical Provider, MD  atorvastatin (LIPITOR) 80 MG tablet Take 80 mg by mouth at bedtime. 09/21/14   Historical Provider, MD  cloNIDine (CATAPRES) 0.1 MG tablet Take 0.1 mg by mouth 2 (two) times daily.  07/08/15   Historical Provider, MD  clopidogrel (PLAVIX) 75 MG tablet Take 75 mg by mouth daily. 09/24/14   Historical Provider, MD  dorzolamide-timolol (COSOPT) 22.3-6.8 MG/ML ophthalmic solution Place 1 drop into both eyes every 12 (twelve) hours. 10/06/14   Historical Provider, MD  ferrous sulfate 325 (65 FE) MG tablet Take 1 tablet (325 mg total) by  mouth 2 (two) times daily with a meal. 03/24/15   Sharee Holster, NP  furosemide (LASIX) 40 MG tablet Take 40 mg by mouth 2 (two) times daily. If weight is greater than 141 lbs please take an extra 40 mg of Lasix    Historical Provider, MD  guaifenesin (ROBAFEN) 100 MG/5ML syrup Take 200 mg by mouth every 6 (six) hours as needed for cough.    Historical Provider, MD  hydrALAZINE (APRESOLINE) 100 MG tablet Take 1 tablet (100 mg total) by mouth every 8 (eight) hours. 11/03/14   Joseph Art, DO  insulin aspart (NOVOLOG) 100 UNIT/ML injection Inject 0-12 Units into the skin 3 (three) times daily before meals. Inject 0 units if blood sugar is 0-150, 151-200=2 units, 201-250=4units, 251-300=6 units, 301-350=8units, 351-400=10units, 401-450=12units, 451 or greater = 12 units and call dr    Historical Provider, MD  insulin glargine (LANTUS) 100 UNIT/ML injection Inject 16 Units into the skin at bedtime.     Historical Provider, MD  isosorbide dinitrate (ISORDIL) 20 MG tablet Take 20 mg by mouth 3 (three) times daily.    Historical Provider, MD  levothyroxine (SYNTHROID, LEVOTHROID) 100 MCG tablet Take 1 tablet (100 mcg total) by mouth daily before breakfast. 11/26/14   Renae Fickle, MD  loperamide (IMODIUM) 2 MG capsule Take 2 mg by mouth as needed for diarrhea or loose stools.    Historical Provider, MD  Melatonin 10 MG CAPS Take 10 mg by mouth daily.    Historical Provider, MD  neomycin-bacitracin-polymyxin (NEOSPORIN) ointment Apply 1 application topically as directed. Apply to skin tear as needed until healed    Historical Provider, MD  oxyCODONE-acetaminophen (PERCOCET/ROXICET) 5-325 MG per tablet Take 1 tablet by mouth every 4 (four) hours as needed for severe pain. Patient taking differently: Take 1 tablet by mouth 4 (four) times daily.  03/04/15   Renae Fickle, MD  pantoprazole (PROTONIX) 20 MG tablet Take 20 mg by mouth daily.    Historical Provider, MD  PARoxetine (PAXIL) 20 MG tablet Take 20 mg  by mouth daily.    Historical Provider, MD  polyvinyl alcohol (LIQUIFILM TEARS) 1.4 % ophthalmic solution Place 1 drop into the left eye 4 (four) times daily.    Historical Provider, MD  prednisoLONE acetate (PRED FORTE) 1 % ophthalmic suspension Place 1 drop into the left eye 4 (four) times daily. 09/27/14   Historical Provider, MD  QUEtiapine (SEROQUEL) 25 MG tablet Take 12.5 mg by mouth 2 (two) times daily.    Historical Provider, MD  spironolactone (ALDACTONE) 25 MG tablet Take 0.5 tablets (12.5 mg total) by mouth daily. 07/14/15   Dolores Patty, MD  traMADol (ULTRAM) 50 MG tablet Take 50 mg by mouth every 12 (twelve) hours as needed for moderate pain.  03/24/15   Sharee Holster, NP  Vitamin D, Ergocalciferol, (DRISDOL) 50000 UNITS CAPS capsule Take 50,000 Units  by mouth See admin instructions. On the 27th day of each month    Historical Provider, MD   BP 135/60 mmHg  Pulse 86  Temp(Src) 98.7 F (37.1 C) (Oral)  Resp 19  SpO2 97% Physical Exam  Constitutional: She appears well-developed and well-nourished. No distress.  HENT:  Head: Normocephalic.  Oral mucosa dry  Eyes: Conjunctivae are normal.  Neck: Neck supple.  Cardiovascular: Normal rate, regular rhythm and normal heart sounds.   Pulmonary/Chest: Effort normal and breath sounds normal. No respiratory distress. She has no wheezes. She has no rales.  Abdominal: Soft. Bowel sounds are normal. She exhibits no distension. There is no tenderness. There is no rebound.  Musculoskeletal: She exhibits no edema.  Neurological: She is alert.  iriented to self. 5/5 and equal grip strneght and LE bilaterally. Moves all extremities.  Skin: Skin is warm and dry.  Psychiatric: She has a normal mood and affect. Her behavior is normal.  Nursing note and vitals reviewed.   ED Course  Procedures (including critical care time) Labs Review Labs Reviewed  BASIC METABOLIC PANEL - Abnormal; Notable for the following:    Chloride 93 (*)     Glucose, Bld 305 (*)    BUN 92 (*)    Creatinine, Ser 4.79 (*)    GFR calc non Af Amer 8 (*)    GFR calc Af Amer 10 (*)    Anion gap 19 (*)    All other components within normal limits  CBG MONITORING, ED - Abnormal; Notable for the following:    Glucose-Capillary 290 (*)    All other components within normal limits  URINALYSIS, ROUTINE W REFLEX MICROSCOPIC (NOT AT [facility])  CBC  BRAIN NATRIURETIC PEPTIDE  I-STAT CG4 LACTIC ACID, ED  I-STAT TROPOININ, ED    Imaging Review No results found. I have personally reviewed and evaluated these images and lab results as part of my medical decision-making.   EKG Interpretation None      MDM   Final diagnoses:  UTI (lower urinary tract infection)  Acute kidney injury (HCC)  Leukocytosis  Anemia, unspecified anemia type   Pt from nursing homeComplaining of progressive weakness, not eating or drinking, recent UTI. Will get lab work, start IV fluids, patient does appear to be dry. Vital signs are normal. She is afebrile.  Patient's rectal temperature is 98.3, patient's lactic acid is normal, patient's white blood cell count however is 19.1. Her BUN/creatinine are elevated. Question dehydration, versus renal failure from patient's UTI. Urinalysis with many bacteria and too numerous to count blood cells. Culture sent. Will also had blood culture. Start on Rocephin. Paged hospitalist for admission  Dr. Madilyn Hookees spoke with admitting doctor, they will admit the patient.    Jaynie Crumbleatyana Markham Dumlao, PA-C 01/17/16 0544  Jaynie Crumbleatyana Mayley Lish, PA-C 01/17/16 0545  Tilden FossaElizabeth Rees, MD 01/17/16 2255

## 2016-01-16 LAB — CBC
HEMATOCRIT: 27.7 % — AB (ref 36.0–46.0)
HEMOGLOBIN: 9.2 g/dL — AB (ref 12.0–15.0)
MCH: 29.3 pg (ref 26.0–34.0)
MCHC: 33.2 g/dL (ref 30.0–36.0)
MCV: 88.2 fL (ref 78.0–100.0)
Platelets: 418 10*3/uL — ABNORMAL HIGH (ref 150–400)
RBC: 3.14 MIL/uL — ABNORMAL LOW (ref 3.87–5.11)
RDW: 13.2 % (ref 11.5–15.5)
WBC: 19.1 10*3/uL — AB (ref 4.0–10.5)

## 2016-01-16 LAB — GLUCOSE, CAPILLARY
GLUCOSE-CAPILLARY: 346 mg/dL — AB (ref 65–99)
GLUCOSE-CAPILLARY: 111 mg/dL — AB (ref 65–99)
Glucose-Capillary: 245 mg/dL — ABNORMAL HIGH (ref 65–99)
Glucose-Capillary: 266 mg/dL — ABNORMAL HIGH (ref 65–99)
Glucose-Capillary: 89 mg/dL (ref 65–99)

## 2016-01-16 LAB — BASIC METABOLIC PANEL
ANION GAP: 14 (ref 5–15)
BUN: 83 mg/dL — ABNORMAL HIGH (ref 6–20)
CALCIUM: 8.7 mg/dL — AB (ref 8.9–10.3)
CO2: 25 mmol/L (ref 22–32)
Chloride: 98 mmol/L — ABNORMAL LOW (ref 101–111)
Creatinine, Ser: 4.17 mg/dL — ABNORMAL HIGH (ref 0.44–1.00)
GFR calc Af Amer: 11 mL/min — ABNORMAL LOW (ref >60)
GFR, EST NON AFRICAN AMERICAN: 10 mL/min — AB (ref >60)
GLUCOSE: 227 mg/dL — AB (ref 65–99)
POTASSIUM: 3.7 mmol/L (ref 3.5–5.1)
SODIUM: 137 mmol/L (ref 135–145)

## 2016-01-16 LAB — MRSA PCR SCREENING: MRSA BY PCR: POSITIVE — AB

## 2016-01-16 MED ORDER — CHLORHEXIDINE GLUCONATE CLOTH 2 % EX PADS
6.0000 | MEDICATED_PAD | Freq: Every day | CUTANEOUS | Status: AC
  Administered 2016-01-16 – 2016-01-20 (×5): 6 via TOPICAL

## 2016-01-16 MED ORDER — MUPIROCIN 2 % EX OINT
1.0000 "application " | TOPICAL_OINTMENT | Freq: Two times a day (BID) | CUTANEOUS | Status: AC
  Administered 2016-01-16 – 2016-01-20 (×10): 1 via NASAL
  Filled 2016-01-16: qty 22

## 2016-01-16 NOTE — Progress Notes (Signed)
Patient sister in law and Power of Gerrit Friendsttorney, [name], called and wanted to report that patient can be very stubborn and manipulative.  Please call Victorino DikeJennifer with any questions [phone].

## 2016-01-16 NOTE — Progress Notes (Signed)
Patient Power of Gerrit Friendsttorney, Laney PotashJennifer Anderson, called back after speaking with the patient's SNF staff and wanted to report that she found out by the staff that the patient has not been eating for two weeks and has had to be encouraged to eat, with patient sometimes spitting out her food and not taking her medication.  Victorino DikeJennifer wanted to add this information to her prior note.

## 2016-01-16 NOTE — Progress Notes (Signed)
PROGRESS NOTE    Taylor Padilla  WRU:[id number] DOB: 1945/10/03 DOA: 01/15/2016 PCP: No PCP Per Patient  Outpatient Specialists: Dr Gala Romney    Brief Narrative: Taylor Padilla is a 70 y.o. female with a history of CAD, Diastolic CHF, HTN, DM2 who was sent to the ED from the [facility] due to increased weakness and poor intake of foods and liquids for the past 4 days. She was found to have an elevated BUN/Cr of 92/4.79 and a positive UA.  Patient presents with acute on chronic renal failure, and UTI. FTT.    Assessment & Plan:   Principal Problem:   Acute kidney injury (HCC) Active Problems:   Diabetes mellitus type II, uncontrolled (HCC)   CAD (coronary artery disease)   CKD (chronic kidney disease) stage 4, GFR 15-29 ml/min (HCC)   Anemia   Essential hypertension   Chronic diastolic heart failure (HCC)   UTI (urinary tract infection)   Acute on chronic renal failure stage IV. Last cr per records range 2.2 to 2.4  Continue with IV fluids.  Repeat labs in am.  Bladder scan. Check renal US.  Continue to hold lasix and spironolactone.   UTI/ presents with Too numerous to count WBC. Continue with ceftriaxone. Follow culture.  WBC trending down.   CAD , Chronic diastolic heart failure  Compensated. Monitor volume status.   Essential hypertension Continue Amlodipine, and Clonidine   Diabetes mellitus type II, uncontrolled Continue with lantus and SSI/   Anemia; hb stable.    DVT prophylaxis: heparin  Code Status: full code.  Family Communication: none at bedside. Patient does not want me to call anybody  Disposition Plan: to be determine. Need PT evaluation    Consultants:   none  Procedures:   none  Antimicrobials:  Ceftriaxone 5-13   Subjective: She is feeling ok, she is sleepy. Has been confuse per nurse.  Per patient she had a BM.   Objective: Filed Vitals:   01/15/16 2300 01/15/16 2351 01/16/16 0535 01/16/16 0821   BP: 154/66 166/74 177/73 158/72  Pulse: 88 100 91 92  Temp:  98.8 F (37.1 C) 98.6 F (37 C)   TempSrc:  Oral Oral   Resp: 16 16 17    Height:  5\' 1"  (1.549 m)    Weight:  59.7 kg (131 lb 9.8 oz)    SpO2: 99% 99% 98%     Intake/Output Summary (Last 24 hours) at 01/16/16 1224 Last data filed at 01/16/16 0100  Gross per 24 hour  Intake   1410 ml  Output      0 ml  Net   1410 ml   Filed Weights   01/15/16 2351  Weight: 59.7 kg (131 lb 9.8 oz)    Examination:  General exam: Appears calm and comfortable , sleepy but wake up and answer questions.  Respiratory system: Clear to auscultation. Respiratory effort normal. Cardiovascular system: S1 & S2 heard, RRR. No JVD, murmurs, rubs, gallops or clicks. No pedal edema. Gastrointestinal system: Abdomen is nondistended, soft and nontender. No organomegaly or masses felt. Normal bowel sounds heard. Extremities: Symmetric 5 x 5 power. Skin: No rashes, lesions or ulcers     Data Reviewed: I have personally reviewed following labs and imaging studies  CBC:  Recent Labs Lab 01/15/16 2033 01/16/16 0607  WBC 21.3* 19.1*  NEUTROABS 19.0*  --   HGB 9.9* 9.2*  HCT 30.3* 27.7*  MCV 90.7 88.2  PLT 439* 418*   Basic Metabolic Panel:  Recent Labs Lab 01/15/16 2013 01/16/16 0607  NA 135 137  K 3.9 3.7  CL 93* 98*  CO2 23 25  GLUCOSE 305* 227*  BUN 92* 83*  CREATININE 4.79* 4.17*  CALCIUM 9.4 8.7*   GFR: Estimated Creatinine Clearance: 10.3 mL/min (by C-G formula based on Cr of 4.17). Liver Function Tests: No results for input(s): AST, ALT, ALKPHOS, BILITOT, PROT, ALBUMIN in the last 168 hours. No results for input(s): LIPASE, AMYLASE in the last 168 hours. No results for input(s): AMMONIA in the last 168 hours. Coagulation Profile: No results for input(s): INR, PROTIME in the last 168 hours. Cardiac Enzymes: No results for input(s): CKTOTAL, CKMB, CKMBINDEX, TROPONINI in the last 168 hours. BNP (last 3 results) No  results for input(s): PROBNP in the last 8760 hours. HbA1C: No results for input(s): HGBA1C in the last 72 hours. CBG:  Recent Labs Lab 01/15/16 1950 01/16/16 0105 01/16/16 0735 01/16/16 1123  GLUCAP 290* 346* 245* 266*   Lipid Profile: No results for input(s): CHOL, HDL, LDLCALC, TRIG, CHOLHDL, LDLDIRECT in the last 72 hours. Thyroid Function Tests: No results for input(s): TSH, T4TOTAL, FREET4, T3FREE, THYROIDAB in the last 72 hours. Anemia Panel: No results for input(s): VITAMINB12, FOLATE, FERRITIN, TIBC, IRON, RETICCTPCT in the last 72 hours. Urine analysis:    Component Value Date/Time   COLORURINE YELLOW 01/15/2016 2112   APPEARANCEUR TURBID* 01/15/2016 2112   LABSPEC 1.015 01/15/2016 2112   PHURINE 5.0 01/15/2016 2112   GLUCOSEU 250* 01/15/2016 2112   HGBUR LARGE* 01/15/2016 2112   BILIRUBINUR NEGATIVE 01/15/2016 2112   KETONESUR NEGATIVE 01/15/2016 2112   PROTEINUR 100* 01/15/2016 2112   UROBILINOGEN 0.2 07/10/2015 0401   NITRITE NEGATIVE 01/15/2016 2112   LEUKOCYTESUR LARGE* 01/15/2016 2112   Sepsis Labs:  Recent Labs Lab 01/15/16 2045  LATICACIDVEN 1.07    Recent Results (from the past 240 hour(s))  MRSA PCR Screening     Status: Abnormal   Collection Time: 01/16/16 12:26 AM  Result Value Ref Range Status   MRSA by PCR POSITIVE (A) NEGATIVE Final    Comment:        The GeneXpert MRSA Assay (FDA approved for NASAL specimens only), is one component of a comprehensive MRSA colonization surveillance program. It is not intended to diagnose MRSA infection nor to guide or monitor treatment for MRSA infections. RESULT CALLED TO, READ BACK BY AND VERIFIED WITH: A.LEMONS RN [phone] A.QUIZON [id number]          Radiology Studies: Dg Chest Port 1 View  01/15/2016  CLINICAL DATA:  Vomiting EXAM: PORTABLE CHEST 1 VIEW COMPARISON:  09/22/2015 FINDINGS: Unchanged borderline cardiomegaly. Prior sternotomy and CABG. Mild chronic appearing interstitial  coarsening. No consolidation. No large effusion. No pneumothorax. Unremarkable hilar and mediastinal contours. IMPRESSION: No acute cardiopulmonary findings. Unchanged borderline cardiomegaly. Electronically Signed   By: Ellery Plunkaniel R Mitchell M.D.   On: 01/15/2016 23:17        Scheduled Meds: . ALPRAZolam  0.25 mg Oral BID  . amLODipine  10 mg Oral Daily  . atorvastatin  80 mg Oral QHS  . cefTRIAXone (ROCEPHIN)  IV  1 g Intravenous Q24H  . Chlorhexidine Gluconate Cloth  6 each Topical Q0600  . cloNIDine  0.1 mg Oral BID  . clopidogrel  75 mg Oral Daily  . dorzolamide-timolol  1 drop Both Eyes Q12H  . ferrous sulfate  325 mg Oral BID WC  . heparin  5,000 Units Subcutaneous Q8H  . hydrALAZINE  100 mg Oral Q8H  .  insulin aspart  0-5 Units Subcutaneous QHS  . insulin aspart  0-9 Units Subcutaneous TID WC  . insulin glargine  16 Units Subcutaneous QHS  . isosorbide dinitrate  20 mg Oral TID  . levothyroxine  100 mcg Oral QAC breakfast  . mupirocin ointment  1 application Nasal BID  . pantoprazole  20 mg Oral Daily  . PARoxetine  20 mg Oral Daily  . polyvinyl alcohol  1 drop Left Eye QID  . prednisoLONE acetate  1 drop Left Eye QID  . QUEtiapine  12.5 mg Oral BID   Continuous Infusions: . sodium chloride       LOS: 1 day    Time spent: 35 minutes.     Alba Cory, MD [facility] Pager [phone]  If 7PM-7AM, please contact night-coverage [email or web address] Password [facility] 01/16/2016, 12:24 PM

## 2016-01-17 ENCOUNTER — Inpatient Hospital Stay (HOSPITAL_COMMUNITY): Payer: Medicare Other

## 2016-01-17 LAB — URINE CULTURE: Culture: NO GROWTH

## 2016-01-17 LAB — HEMOGLOBIN A1C
HEMOGLOBIN A1C: 7.8 % — AB (ref 4.8–5.6)
Mean Plasma Glucose: 177 mg/dL

## 2016-01-17 LAB — CBC
HEMATOCRIT: 27.6 % — AB (ref 36.0–46.0)
HEMOGLOBIN: 8.9 g/dL — AB (ref 12.0–15.0)
MCH: 29.2 pg (ref 26.0–34.0)
MCHC: 32.2 g/dL (ref 30.0–36.0)
MCV: 90.5 fL (ref 78.0–100.0)
Platelets: 355 10*3/uL (ref 150–400)
RBC: 3.05 MIL/uL — ABNORMAL LOW (ref 3.87–5.11)
RDW: 13.3 % (ref 11.5–15.5)
WBC: 13.8 10*3/uL — ABNORMAL HIGH (ref 4.0–10.5)

## 2016-01-17 LAB — BASIC METABOLIC PANEL
ANION GAP: 11 (ref 5–15)
BUN: 68 mg/dL — AB (ref 6–20)
CO2: 22 mmol/L (ref 22–32)
Calcium: 8.2 mg/dL — ABNORMAL LOW (ref 8.9–10.3)
Chloride: 100 mmol/L — ABNORMAL LOW (ref 101–111)
Creatinine, Ser: 3.65 mg/dL — ABNORMAL HIGH (ref 0.44–1.00)
GFR calc Af Amer: 13 mL/min — ABNORMAL LOW (ref >60)
GFR calc non Af Amer: 12 mL/min — ABNORMAL LOW (ref >60)
GLUCOSE: 135 mg/dL — AB (ref 65–99)
POTASSIUM: 3.6 mmol/L (ref 3.5–5.1)
Sodium: 133 mmol/L — ABNORMAL LOW (ref 135–145)

## 2016-01-17 LAB — GLUCOSE, CAPILLARY
Glucose-Capillary: 140 mg/dL — ABNORMAL HIGH (ref 65–99)
Glucose-Capillary: 172 mg/dL — ABNORMAL HIGH (ref 65–99)
Glucose-Capillary: 96 mg/dL (ref 65–99)
Glucose-Capillary: 143 mg/dL — ABNORMAL HIGH (ref 65–99)

## 2016-01-17 MED ORDER — INSULIN GLARGINE 100 UNIT/ML ~~LOC~~ SOLN
10.0000 [IU] | Freq: Every day | SUBCUTANEOUS | Status: DC
  Administered 2016-01-17: 10 [IU] via SUBCUTANEOUS
  Filled 2016-01-17: qty 0.1

## 2016-01-17 NOTE — Care Management Note (Signed)
Case Management Note  Patient Details  Name: Valentino HueDorcas Blanton MRN: [id number] Date of Birth: Dec 20, 1945  Subjective/Objective:              aki      Action/Plan:Date:  Jan 17, 2016 Chart reviewed for concurrent status and case management needs. Will continue to follow patient for changes and needs: Expected discharge date: [id number] Marcelle SmilingRhonda Noelly Lasseigne, BSN, [place]3, [place]   [phone]   Expected Discharge Date:                  Expected Discharge Plan:  Skilled Nursing Facility  In-House Referral:  Clinical Social Work  Discharge planning Services  CM Consult  Post Acute Care Choice:  NA Choice offered to:  NA  DME Arranged:  N/A DME Agency:  NA  HH Arranged:  NA HH Agency:  NA  Status of Service:  In process, will continue to follow  Medicare Important Message Given:    Date Medicare IM Given:    Medicare IM give by:    Date Additional Medicare IM Given:    Additional Medicare Important Message give by:     If discussed at Long Length of Stay Meetings, dates discussed:    Additional Comments:  Golda AcreDavis, Donovyn Guidice Lynn, RN 01/17/2016, 12:16 PM

## 2016-01-17 NOTE — Progress Notes (Signed)
PROGRESS NOTE    Taylor Padilla  ZOX:[id number] DOB: 09/30/1945 DOA: 01/15/2016 PCP: No PCP Per Patient  Outpatient Specialists: Dr Gala Romney    Brief Narrative: Taylor Padilla is a 70 y.o. female with a history of CAD, Diastolic CHF, HTN, DM2 who was sent to the ED from the [facility] due to increased weakness and poor intake of foods and liquids for the past 4 days. She was found to have an elevated BUN/Cr of 92/4.79 and a positive UA.  Patient presents with acute on chronic renal failure, and UTI. FTT.    Assessment & Plan:   Principal Problem:   Acute kidney injury (HCC) Active Problems:   Diabetes mellitus type II, uncontrolled (HCC)   CAD (coronary artery disease)   CKD (chronic kidney disease) stage 4, GFR 15-29 ml/min (HCC)   Anemia   Essential hypertension   Chronic diastolic heart failure (HCC)   UTI (urinary tract infection)   Acute on chronic renal failure stage IV. Last cr per records range 2.2 to 2.4  Continue with IV fluids.  Repeat labs in am.  Check renal US.  Continue to hold lasix and spironolactone.  Cr trending down.   UTI/ presents with Too numerous to count WBC. Continue with ceftriaxone. Urine culture no growth but was obtain after patient was started on antibiotics. .  WBC trending down. 19---13  CAD , Chronic diastolic heart failure  Compensated. Monitor volume status.   Essential hypertension Continue , and Clonidine hydralazine,imdur with holder parameters.   Diabetes mellitus type II, uncontrolled Continue with lantus and SSI/  Will decrease lantus dose due to poor oral intake  Anemia; hb stable.  Blind both eyes.    DVT prophylaxis: heparin  Code Status: full code.  Family Communication: none at bedside. Patient allows me to call POA, and give her updates.  I was not able to contact POA>  Disposition Plan: to be determine. Need PT evaluation    Consultants:   none  Procedures:    none  Antimicrobials:  Ceftriaxone 5-13   Subjective: She keeps eyes close, she is blind. She is able to answer questions. She is feeling a little better. She is able to walk some times with help.    Objective: Filed Vitals:   01/16/16 2109 01/17/16 0209 01/17/16 0712 01/17/16 1034  BP: 146/61 122/47 162/60 141/71  Pulse: 78 57 65 61  Temp: 99.1 F (37.3 C) 98.8 F (37.1 C) 97.8 F (36.6 C) 99.2 F (37.3 C)  TempSrc: Oral Axillary Oral Oral  Resp: Height:      Weight:      SpO2: 95% 100% 100% 100%    Intake/Output Summary (Last 24 hours) at 01/17/16 1454 Last data filed at 01/16/16 2110  Gross per 24 hour  Intake    120 ml  Output      0 ml  Net    120 ml   Filed Weights   01/15/16 2351  Weight: 59.7 kg (131 lb 9.8 oz)    Examination:  General exam: Appears calm and comfortable , alert , answer questions.  Respiratory system: Clear to auscultation. Respiratory effort normal. Cardiovascular system: S1 & S2 heard, RRR. No JVD, murmurs, rubs, gallops or clicks. No pedal edema. Gastrointestinal system: Abdomen is nondistended, soft and nontender. No organomegaly or masses felt. Normal bowel sounds heard. Extremities: Symmetric 5 x 5 power. Skin: No rashes, lesions or ulcers     Data Reviewed: I have  personally reviewed following labs and imaging studies  CBC:  Recent Labs Lab 01/15/16 2033 01/16/16 0607 01/17/16 0748  WBC 21.3* 19.1* 13.8*  NEUTROABS 19.0*  --   --   HGB 9.9* 9.2* 8.9*  HCT 30.3* 27.7* 27.6*  MCV 90.7 88.2 90.5  PLT 439* 418* 355   Basic Metabolic Panel:  Recent Labs Lab 01/15/16 2013 01/16/16 0607 01/17/16 0748  NA 135 137 133*  K 3.9 3.7 3.6  CL 93* 98* 100*  CO2 23 25 22   GLUCOSE 305* 227* 135*  BUN 92* 83* 68*  CREATININE 4.79* 4.17* 3.65*  CALCIUM 9.4 8.7* 8.2*   GFR: Estimated Creatinine Clearance: 11.7 mL/min (by C-G formula based on Cr of 3.65). Liver Function Tests: No results for input(s):  AST, ALT, ALKPHOS, BILITOT, PROT, ALBUMIN in the last 168 hours. No results for input(s): LIPASE, AMYLASE in the last 168 hours. No results for input(s): AMMONIA in the last 168 hours. Coagulation Profile: No results for input(s): INR, PROTIME in the last 168 hours. Cardiac Enzymes: No results for input(s): CKTOTAL, CKMB, CKMBINDEX, TROPONINI in the last 168 hours. BNP (last 3 results) No results for input(s): PROBNP in the last 8760 hours. HbA1C:  Recent Labs  01/16/16 0607  HGBA1C 7.8*   CBG:  Recent Labs Lab 01/16/16 1123 01/16/16 1714 01/16/16 2128 01/17/16 0809 01/17/16 1248  GLUCAP 266* 89 111* 140* 172*   Lipid Profile: No results for input(s): CHOL, HDL, LDLCALC, TRIG, CHOLHDL, LDLDIRECT in the last 72 hours. Thyroid Function Tests: No results for input(s): TSH, T4TOTAL, FREET4, T3FREE, THYROIDAB in the last 72 hours. Anemia Panel: No results for input(s): VITAMINB12, FOLATE, FERRITIN, TIBC, IRON, RETICCTPCT in the last 72 hours. Urine analysis:    Component Value Date/Time   COLORURINE YELLOW 01/15/2016 2112   APPEARANCEUR TURBID* 01/15/2016 2112   LABSPEC 1.015 01/15/2016 2112   PHURINE 5.0 01/15/2016 2112   GLUCOSEU 250* 01/15/2016 2112   HGBUR LARGE* 01/15/2016 2112   BILIRUBINUR NEGATIVE 01/15/2016 2112   KETONESUR NEGATIVE 01/15/2016 2112   PROTEINUR 100* 01/15/2016 2112   UROBILINOGEN 0.2 07/10/2015 0401   NITRITE NEGATIVE 01/15/2016 2112   LEUKOCYTESUR LARGE* 01/15/2016 2112   Sepsis Labs:  Recent Labs Lab 01/15/16 2045  LATICACIDVEN 1.07    Recent Results (from the past 240 hour(s))  Urine culture     Status: None   Collection Time: 01/15/16  9:30 PM  Result Value Ref Range Status   Specimen Description URINE, CATHETERIZED  Final   Special Requests NONE  Final   Culture NO GROWTH Performed at [facility]   Final   Report Status 01/17/2016 FINAL  Final  MRSA PCR Screening     Status: Abnormal   Collection Time: 01/16/16 12:26  AM  Result Value Ref Range Status   MRSA by PCR POSITIVE (A) NEGATIVE Final    Comment:        The GeneXpert MRSA Assay (FDA approved for NASAL specimens only), is one component of a comprehensive MRSA colonization surveillance program. It is not intended to diagnose MRSA infection nor to guide or monitor treatment for MRSA infections. RESULT CALLED TO, READ BACK BY AND VERIFIED WITH: A.LEMONS RN [phone] A.QUIZON [id number]          Radiology Studies: Dg Chest Port 1 View  01/15/2016  CLINICAL DATA:  Vomiting EXAM: PORTABLE CHEST 1 VIEW COMPARISON:  09/22/2015 FINDINGS: Unchanged borderline cardiomegaly. Prior sternotomy and CABG. Mild chronic appearing interstitial coarsening. No consolidation. No large effusion. No  pneumothorax. Unremarkable hilar and mediastinal contours. IMPRESSION: No acute cardiopulmonary findings. Unchanged borderline cardiomegaly. Electronically Signed   By: Ellery Plunkaniel R Mitchell M.D.   On: 01/15/2016 23:17        Scheduled Meds: . ALPRAZolam  0.25 mg Oral BID  . atorvastatin  80 mg Oral QHS  . cefTRIAXone (ROCEPHIN)  IV  1 g Intravenous Q24H  . Chlorhexidine Gluconate Cloth  6 each Topical Q0600  . cloNIDine  0.1 mg Oral BID  . clopidogrel  75 mg Oral Daily  . dorzolamide-timolol  1 drop Both Eyes Q12H  . ferrous sulfate  325 mg Oral BID WC  . heparin  5,000 Units Subcutaneous Q8H  . hydrALAZINE  100 mg Oral Q8H  . insulin aspart  0-5 Units Subcutaneous QHS  . insulin aspart  0-9 Units Subcutaneous TID WC  . isosorbide dinitrate  20 mg Oral TID  . levothyroxine  100 mcg Oral QAC breakfast  . mupirocin ointment  1 application Nasal BID  . pantoprazole  20 mg Oral Daily  . PARoxetine  20 mg Oral Daily  . polyvinyl alcohol  1 drop Left Eye QID  . prednisoLONE acetate  1 drop Left Eye QID  . QUEtiapine  12.5 mg Oral BID   Continuous Infusions: . sodium chloride 100 mL/hr at 01/17/16 1041     LOS: 2 days    Time spent: 35 minutes.      Alba Coryegalado, Belkys A, MD [facility] Pager [phone]  If 7PM-7AM, please contact night-coverage [email or web address] Password [email or web address] 01/17/2016, 2:54 PM

## 2016-01-18 ENCOUNTER — Inpatient Hospital Stay (HOSPITAL_COMMUNITY): Payer: Medicare Other

## 2016-01-18 LAB — GLUCOSE, CAPILLARY
Glucose-Capillary: 100 mg/dL — ABNORMAL HIGH (ref 65–99)
Glucose-Capillary: 101 mg/dL — ABNORMAL HIGH (ref 65–99)
Glucose-Capillary: 128 mg/dL — ABNORMAL HIGH (ref 65–99)
Glucose-Capillary: 135 mg/dL — ABNORMAL HIGH (ref 65–99)
Glucose-Capillary: 139 mg/dL — ABNORMAL HIGH (ref 65–99)
Glucose-Capillary: 98 mg/dL (ref 65–99)

## 2016-01-18 LAB — CBC
HCT: 28 % — ABNORMAL LOW (ref 36.0–46.0)
Hemoglobin: 9 g/dL — ABNORMAL LOW (ref 12.0–15.0)
MCH: 29.6 pg (ref 26.0–34.0)
MCHC: 32.1 g/dL (ref 30.0–36.0)
MCV: 92.1 fL (ref 78.0–100.0)
Platelets: 349 10*3/uL (ref 150–400)
RBC: 3.04 MIL/uL — ABNORMAL LOW (ref 3.87–5.11)
RDW: 13.5 % (ref 11.5–15.5)
WBC: 12.3 10*3/uL — ABNORMAL HIGH (ref 4.0–10.5)

## 2016-01-18 LAB — BASIC METABOLIC PANEL WITH GFR
Anion gap: 9 (ref 5–15)
BUN: 57 mg/dL — ABNORMAL HIGH (ref 6–20)
CO2: 23 mmol/L (ref 22–32)
Calcium: 8.5 mg/dL — ABNORMAL LOW (ref 8.9–10.3)
Chloride: 105 mmol/L (ref 101–111)
Creatinine, Ser: 3.22 mg/dL — ABNORMAL HIGH (ref 0.44–1.00)
GFR calc Af Amer: 16 mL/min — ABNORMAL LOW
GFR calc non Af Amer: 13 mL/min — ABNORMAL LOW
Glucose, Bld: 106 mg/dL — ABNORMAL HIGH (ref 65–99)
Potassium: 3.2 mmol/L — ABNORMAL LOW (ref 3.5–5.1)
Sodium: 137 mmol/L (ref 135–145)

## 2016-01-18 MED ORDER — POTASSIUM CHLORIDE CRYS ER 20 MEQ PO TBCR
20.0000 meq | EXTENDED_RELEASE_TABLET | Freq: Once | ORAL | Status: DC
  Filled 2016-01-18: qty 1

## 2016-01-18 MED ORDER — LEVOTHYROXINE SODIUM 100 MCG IV SOLR
50.0000 ug | Freq: Every day | INTRAVENOUS | Status: DC
  Administered 2016-01-19: 50 ug via INTRAVENOUS
  Filled 2016-01-18 (×2): qty 5

## 2016-01-18 NOTE — Progress Notes (Signed)
PT Cancellation Note  Patient Details Name: Taylor Padilla MRN: [id number] DOB: [birth year]   Cancelled Treatment:    Reason Eval/Treat Not Completed: Fatigue/lethargy limiting ability to participate. Spoke with RN who reported pt has been less responsive recently. Will check back later today if schedule allows. Thanks. Otherwise, will check back tomorrow.    Rebeca AlertJannie Shaiann Mcmanamon, MPT Pager: [phone]

## 2016-01-18 NOTE — Progress Notes (Addendum)
PROGRESS NOTE    Taylor Padilla  ZOX:[id number] DOB: 1945/11/19 DOA: 01/15/2016 PCP: No PCP Per Patient  Outpatient Specialists: Dr Gala Romney    Brief Narrative: Taylor Padilla is a 70 y.o. female with a history of CAD, Diastolic CHF, HTN, DM2 who was sent to the ED from the [facility] due to increased weakness and poor intake of foods and liquids for the past 4 days. She was found to have an elevated BUN/Cr of 92/4.79 and a positive UA.  Patient presents with acute on chronic renal failure, and UTI. FTT. She has been treated with IV fluids, IV antibiotics. Her renal function has improved, slightly. Patient is more lethargic 5-16. This could be multifactorial secondary to sedatives, vs delirium , also component of uremia. Will discontinue Xanax, Seroquel. Will obtain CT head. Nephrology consulted for evaluation of renal failure.    Assessment & Plan:   Principal Problem:   Acute kidney injury (HCC) Active Problems:   Diabetes mellitus type II, uncontrolled (HCC)   CAD (coronary artery disease)   CKD (chronic kidney disease) stage 4, GFR 15-29 ml/min (HCC)   Anemia   Essential hypertension   Chronic diastolic heart failure (HCC)   UTI (urinary tract infection)   Acute on chronic renal failure stage IV. Last cr per records range 2.2 to 2.4  Continue with IV fluids.  Creatinine trending down, and BUN.  renal US negative for hydronephrosis. .  Continue to hold lasix and spironolactone.  Per family patient for last 2 weeks has been more sleepy, poor oral intake...Marland Kitchennephrologist consulted.   Acute encephalopathy; Patient more lethargic this am.  Differential: uremia, medications, sedatives, delirium.  CT head.  Hold xanax, Seroquel....   UTI/ presents with Too numerous to count WBC. Continue with ceftriaxone. Urine culture no growth but was obtain after patient was started on antibiotics. .  WBC trending down. 19---13---12  CAD , Chronic diastolic  heart failure  Compensated. Monitor volume status.   Essential hypertension Continue , and Clonidine hydralazine,imdur with holder parameters.   Diabetes mellitus type II, uncontrolled Continue with lantus and SSI/  Hold  lantus dose due to poor oral intake  Anemia; hb stable.  Blind both eyes.    DVT prophylaxis: heparin  Code Status: full code.  Family Communication: none at bedside. Patient allows me to call POA, and give her updates.  I was not able to contact POA>  Disposition Plan: to be determine. Need PT evaluation    Consultants:   none  Procedures:   none  Antimicrobials:  Ceftriaxone 5-13   Subjective: She is more sleepy, lethargic today, say few words, said what. Goes back to sleep.    Objective: Filed Vitals:   01/17/16 2208 01/18/16 0200 01/18/16 0533 01/18/16 0942  BP: 134/77 132/65 151/60 156/76  Pulse: 63 62 57 57  Temp: 98.3 F (36.8 C) 98 F (36.7 C) 98.2 F (36.8 C) 98.5 F (36.9 C)  TempSrc: Oral Axillary Oral Axillary  Resp: Height:      Weight:      SpO2: 99% 93% 100% 100%    Intake/Output Summary (Last 24 hours) at 01/18/16 0954 Last data filed at 01/18/16 0855  Gross per 24 hour  Intake 4438.33 ml  Output      0 ml  Net 4438.33 ml   Filed Weights   01/15/16 2351  Weight: 59.7 kg (131 lb 9.8 oz)    Examination:  General exam: NAD, lethargic Respiratory system:  Clear to auscultation. Respiratory effort normal. Cardiovascular system: S1 & S2 heard, RRR. No JVD, murmurs, rubs, gallops or clicks. No pedal edema. Gastrointestinal system: Abdomen is nondistended, soft and nontender. No organomegaly or masses felt. Normal bowel sounds heard. Extremities: Symmetric 5 x 5 power. Skin: No rashes, lesions or ulcers     Data Reviewed: I have personally reviewed following labs and imaging studies  CBC:  Recent Labs Lab 01/15/16 2033 01/16/16 0607 01/17/16 0748 01/18/16 0524  WBC 21.3* 19.1* 13.8* 12.3*    NEUTROABS 19.0*  --   --   --   HGB 9.9* 9.2* 8.9* 9.0*  HCT 30.3* 27.7* 27.6* 28.0*  MCV 90.7 88.2 90.5 92.1  PLT 439* 418* 355 349   Basic Metabolic Panel:  Recent Labs Lab 01/15/16 2013 01/16/16 0607 01/17/16 0748 01/18/16 0524  NA 135 137 133* 137  K 3.9 3.7 3.6 3.2*  CL 93* 98* 100* 105  CO2 23 25 22 23   GLUCOSE 305* 227* 135* 106*  BUN 92* 83* 68* 57*  CREATININE 4.79* 4.17* 3.65* 3.22*  CALCIUM 9.4 8.7* 8.2* 8.5*   GFR: Estimated Creatinine Clearance: 13.3 mL/min (by C-G formula based on Cr of 3.22). Liver Function Tests: No results for input(s): AST, ALT, ALKPHOS, BILITOT, PROT, ALBUMIN in the last 168 hours. No results for input(s): LIPASE, AMYLASE in the last 168 hours. No results for input(s): AMMONIA in the last 168 hours. Coagulation Profile: No results for input(s): INR, PROTIME in the last 168 hours. Cardiac Enzymes: No results for input(s): CKTOTAL, CKMB, CKMBINDEX, TROPONINI in the last 168 hours. BNP (last 3 results) No results for input(s): PROBNP in the last 8760 hours. HbA1C:  Recent Labs  01/16/16 0607  HGBA1C 7.8*   CBG:  Recent Labs Lab 01/17/16 1248 01/17/16 1651 01/17/16 2313 01/18/16 0730 01/18/16 0937  GLUCAP 172* 96 143* 100* 128*   Lipid Profile: No results for input(s): CHOL, HDL, LDLCALC, TRIG, CHOLHDL, LDLDIRECT in the last 72 hours. Thyroid Function Tests: No results for input(s): TSH, T4TOTAL, FREET4, T3FREE, THYROIDAB in the last 72 hours. Anemia Panel: No results for input(s): VITAMINB12, FOLATE, FERRITIN, TIBC, IRON, RETICCTPCT in the last 72 hours. Urine analysis:    Component Value Date/Time   COLORURINE YELLOW 01/15/2016 2112   APPEARANCEUR TURBID* 01/15/2016 2112   LABSPEC 1.015 01/15/2016 2112   PHURINE 5.0 01/15/2016 2112   GLUCOSEU 250* 01/15/2016 2112   HGBUR LARGE* 01/15/2016 2112   BILIRUBINUR NEGATIVE 01/15/2016 2112   KETONESUR NEGATIVE 01/15/2016 2112   PROTEINUR 100* 01/15/2016 2112    UROBILINOGEN 0.2 07/10/2015 0401   NITRITE NEGATIVE 01/15/2016 2112   LEUKOCYTESUR LARGE* 01/15/2016 2112   Sepsis Labs:  Recent Labs Lab 01/15/16 2045  LATICACIDVEN 1.07    Recent Results (from the past 240 hour(s))  Urine culture     Status: None   Collection Time: 01/15/16  9:30 PM  Result Value Ref Range Status   Specimen Description URINE, CATHETERIZED  Final   Special Requests NONE  Final   Culture NO GROWTH Performed at [facility]   Final   Report Status 01/17/2016 FINAL  Final  MRSA PCR Screening     Status: Abnormal   Collection Time: 01/16/16 12:26 AM  Result Value Ref Range Status   MRSA by PCR POSITIVE (A) NEGATIVE Final    Comment:        The GeneXpert MRSA Assay (FDA approved for NASAL specimens only), is one component of a comprehensive MRSA colonization surveillance program.  It is not intended to diagnose MRSA infection nor to guide or monitor treatment for MRSA infections. RESULT CALLED TO, READ BACK BY AND VERIFIED WITH: A.LEMONS RN [phone] [email or web address] [id number]          Radiology Studies: US Renal  01/17/2016  CLINICAL DATA:  Renal failure, hypertension, coronary artery disease post MI, hypercholesterolemia, stage III chronic kidney disease, CHF, type II diabetes mellitus EXAM: RENAL / URINARY TRACT ULTRASOUND COMPLETE COMPARISON:  11/25/2014 FINDINGS: Right Kidney: Length: 9.5 cm. Cortical thinning. Upper cortical echogenicity. Small exophytic nodule at upper to mid kidney likely small cyst 2.3 x 1.7 x 2.2 cm, noted previously. No solid mass or hydronephrosis. Left Kidney: Length: 10.5 cm. Cortical thinning. Increased cortical echogenicity. Questionable small hypoechoic nodule at lower pole versus artifact, poorly visualized, 1.4 x 1.2 x 1.6 cm, complex cyst not excluded. A cyst was identified at the inferior pole LEFT kidney on the previous exam, slightly larger than the current finding. No additional or enlarging mass, or evidence of  hydronephrosis. Bladder: Normal appearance IMPRESSION: BILATERAL renal cortical atrophy. Small RIGHT renal cysts, with complicated cystic lesion at inferior pole LEFT kidney less well visualized than on previous study. Electronically Signed   By: Ulyses Southward M.D.   On: 01/17/2016 23:35        Scheduled Meds: . atorvastatin  80 mg Oral QHS  . cefTRIAXone (ROCEPHIN)  IV  1 g Intravenous Q24H  . Chlorhexidine Gluconate Cloth  6 each Topical Q0600  . cloNIDine  0.1 mg Oral BID  . clopidogrel  75 mg Oral Daily  . dorzolamide-timolol  1 drop Both Eyes Q12H  . ferrous sulfate  325 mg Oral BID WC  . heparin  5,000 Units Subcutaneous Q8H  . insulin aspart  0-5 Units Subcutaneous QHS  . insulin aspart  0-9 Units Subcutaneous TID WC  . isosorbide dinitrate  20 mg Oral TID  . levothyroxine  50 mcg Intravenous Daily  . mupirocin ointment  1 application Nasal BID  . pantoprazole  20 mg Oral Daily  . polyvinyl alcohol  1 drop Left Eye QID  . potassium chloride  20 mEq Oral Once  . prednisoLONE acetate  1 drop Left Eye QID   Continuous Infusions: . sodium chloride 100 mL/hr at 01/18/16 0641     LOS: 3 days    Time spent: 35 minutes.     Alba Cory, MD [facility] Pager [phone]  If 7PM-7AM, please contact night-coverage [email or web address] Password [facility] 01/18/2016, 9:54 AM

## 2016-01-18 NOTE — Progress Notes (Signed)
PT Cancellation Note  Patient Details Name: Valentino HueDorcas Lepera MRN: [id number] DOB: [birth year]   Cancelled Treatment:    Reason Eval/Treat Not Completed: Patient at procedure or test/unavailable. 2nd attempt for PT eval. Pt being taken away for procedure/test. Will check back on tomorrow. Thanks.    Rebeca AlertJannie Chandlar Staebell, MPT Pager: [phone]

## 2016-01-18 NOTE — Care Management Important Message (Signed)
Important Message  Patient Details IM Letter given to Rhonda/Case Manager to present to Patient Name: Taylor Padilla MRN: [id number] Date of Birth: Jun 05, 1946   Medicare Important Message Given:  Yes    Haskell FlirtJamison, Yamil Oelke 01/18/2016, 9:23 AMImportant Message  Patient Details  Name: Taylor Padilla MRN: [id number] Date of Birth: Jun 05, 1946   Medicare Important Message Given:  Yes    Haskell FlirtJamison, Janaria Mccammon 01/18/2016, 9:23 AM

## 2016-01-19 DIAGNOSIS — N179 Acute kidney failure, unspecified: Principal | ICD-10-CM

## 2016-01-19 DIAGNOSIS — N390 Urinary tract infection, site not specified: Secondary | ICD-10-CM

## 2016-01-19 DIAGNOSIS — I5032 Chronic diastolic (congestive) heart failure: Secondary | ICD-10-CM

## 2016-01-19 DIAGNOSIS — G9340 Encephalopathy, unspecified: Secondary | ICD-10-CM

## 2016-01-19 DIAGNOSIS — E118 Type 2 diabetes mellitus with unspecified complications: Secondary | ICD-10-CM

## 2016-01-19 DIAGNOSIS — Z794 Long term (current) use of insulin: Secondary | ICD-10-CM

## 2016-01-19 DIAGNOSIS — I2510 Atherosclerotic heart disease of native coronary artery without angina pectoris: Secondary | ICD-10-CM

## 2016-01-19 DIAGNOSIS — E1165 Type 2 diabetes mellitus with hyperglycemia: Secondary | ICD-10-CM

## 2016-01-19 DIAGNOSIS — N184 Chronic kidney disease, stage 4 (severe): Secondary | ICD-10-CM

## 2016-01-19 LAB — BASIC METABOLIC PANEL
Anion gap: 12 (ref 5–15)
BUN: 44 mg/dL — AB (ref 6–20)
CALCIUM: 8.4 mg/dL — AB (ref 8.9–10.3)
CHLORIDE: 102 mmol/L (ref 101–111)
CO2: 20 mmol/L — ABNORMAL LOW (ref 22–32)
CREATININE: 2.8 mg/dL — AB (ref 0.44–1.00)
GFR calc Af Amer: 18 mL/min — ABNORMAL LOW (ref >60)
GFR calc non Af Amer: 16 mL/min — ABNORMAL LOW (ref >60)
Glucose, Bld: 150 mg/dL — ABNORMAL HIGH (ref 65–99)
Potassium: 3.5 mmol/L (ref 3.5–5.1)
SODIUM: 134 mmol/L — AB (ref 135–145)

## 2016-01-19 LAB — GLUCOSE, CAPILLARY
GLUCOSE-CAPILLARY: 145 mg/dL — AB (ref 65–99)
Glucose-Capillary: 130 mg/dL — ABNORMAL HIGH (ref 65–99)
Glucose-Capillary: 149 mg/dL — ABNORMAL HIGH (ref 65–99)
Glucose-Capillary: 129 mg/dL — ABNORMAL HIGH (ref 65–99)
Glucose-Capillary: 134 mg/dL — ABNORMAL HIGH (ref 65–99)

## 2016-01-19 LAB — CBC
HCT: 28.2 % — ABNORMAL LOW (ref 36.0–46.0)
Hemoglobin: 9.4 g/dL — ABNORMAL LOW (ref 12.0–15.0)
MCH: 29.2 pg (ref 26.0–34.0)
MCHC: 33.3 g/dL (ref 30.0–36.0)
MCV: 87.6 fL (ref 78.0–100.0)
PLATELETS: ADEQUATE 10*3/uL (ref 150–400)
RBC: 3.22 MIL/uL — ABNORMAL LOW (ref 3.87–5.11)
RDW: 13.2 % (ref 11.5–15.5)
WBC: 15.5 10*3/uL — AB (ref 4.0–10.5)

## 2016-01-19 LAB — CREATININE, URINE, RANDOM: Creatinine, Urine: 32.62 mg/dL

## 2016-01-19 LAB — SODIUM, URINE, RANDOM: Sodium, Ur: 92 mmol/L

## 2016-01-19 MED ORDER — CEFPODOXIME PROXETIL 200 MG PO TABS
200.0000 mg | ORAL_TABLET | ORAL | Status: DC
  Administered 2016-01-19 – 2016-01-21 (×3): 200 mg via ORAL
  Filled 2016-01-19 (×4): qty 1

## 2016-01-19 MED ORDER — LEVOTHYROXINE SODIUM 100 MCG PO TABS
100.0000 ug | ORAL_TABLET | Freq: Every day | ORAL | Status: DC
  Administered 2016-01-20 – 2016-01-21 (×2): 100 ug via ORAL
  Filled 2016-01-19 (×3): qty 1

## 2016-01-19 MED ORDER — CEFPODOXIME PROXETIL 200 MG PO TABS: 200.0000 mg | ORAL_TABLET | Freq: Two times a day (BID) | ORAL | Status: DC

## 2016-01-19 NOTE — Consult Note (Signed)
Renal Service Consult Note [facility]  Taylor [name] 01/19/2016 Taylor Padilla D Requesting Physician:  Dr Sunnie Nielsenegalado  Reason for Consult:  Acute on CKD IV HPI: The patient is a 70 y.o. year-old with hx of DM, HTN and CKD stage IV admitted with creat 4.5 (baseline 2.5), confusion, dirty UA / ^WBC suspicious for UTI/ sepsis.  Asked for see for acute on CRF.    Pt confused, no history provided.  No IV contrast/ ACEi, was on home diuretics which are on hold now.  In hospital was receiving xanax/ seroquel and this is now stopped.  Creat declining with IVF"s, making urine.   Baseline creat in FEb this year is 2.2- 2.5.    Past Medical History  Past Medical History  Diagnosis Date  . Hypertension   . CAD (coronary artery disease)   . 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 Macular edema     hx  . Glaucoma, both eyes   . High cholesterol   . CHF (congestive heart failure) (HCC)   . Chronic kidney disease (CKD), stage II (mild)     [name]/notes 10/07/2014  . Old myocardial infarct     "I was told I've had one; don't know when" (10/08/2014)  . Pneumonia 1990's X 1  . Hypothyroidism   . Type II diabetes mellitus (HCC)   . Anemia   . History of blood transfusion 2003    "related to [facility]"  . Stroke [facility](HCC)     "I've been told I've had one; I don't remember having any reaction from it at all"; denies residual on 10/08/2014  . Heart failure (HCC)   . GERD (gastroesophageal reflux disease)    Past Surgical History  Past Surgical History  Procedure Laterality Date  . 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 Cataract extraction w/ intraocular lens  implant, bilateral Bilateral    Family History  Family History  Problem Relation Age of Onset  . CAD Neg Hx    Social History  reports that she has never smoked. She has never used smokeless tobacco. She reports that she does not drink alcohol or use illicit drugs. Allergies  Allergies  Allergen Reactions  .  Epinephrine Other (See Comments)    Patient says she is not allergic to epi, but that it makes her jittery.  WILL RECEIVE IF NEEDED IN AN EMERGENCY.   Home medications Prior to Admission medications   Medication Sig Start Date End Date Taking? Authorizing Provider  acetaminophen (TYLENOL) 500 MG tablet Take 500 mg by mouth every 4 (four) hours as needed for mild pain, fever or headache.   Yes Historical Provider, MD  ALPRAZolam (XANAX) 0.25 MG tablet Take 0.25 mg by mouth 2 (two) times daily.    Yes Historical Provider, MD  alum & mag hydroxide-simeth (MINTOX) 200-200-20 MG/5ML suspension Take 30 mLs by mouth as needed for indigestion or heartburn.   Yes Historical Provider, MD  amLODipine (NORVASC) 10 MG tablet Take 10 mg by mouth daily. 09/27/14  Yes Historical Provider, MD  atorvastatin (LIPITOR) 80 MG tablet Take 80 mg by mouth at bedtime. 09/21/14  Yes Historical Provider, MD  ciprofloxacin (CIPRO) 500 MG tablet Take 500 mg by mouth 2 (two) times daily. 01/11/16  Yes Historical Provider, MD  cloNIDine (CATAPRES) 0.1 MG tablet Take 0.1 mg by mouth 2 (two) times daily.  07/08/15  Yes Historical Provider, MD  clopidogrel (PLAVIX) 75 MG tablet Take 75 mg  by mouth daily. 09/24/14  Yes Historical Provider, MD  dorzolamide-timolol (COSOPT) 22.3-6.8 MG/ML ophthalmic solution Place 1 drop into both eyes every 12 (twelve) hours. 10/06/14  Yes Historical Provider, MD  ferrous sulfate 325 (65 FE) MG tablet Take 1 tablet (325 mg total) by mouth 2 (two) times daily with a meal. 03/24/15  Yes Sharee Holster, NP  furosemide (LASIX) 40 MG tablet Take 40 mg by mouth 2 (two) times daily. If weight is greater than 141 lbs please take an extra 40 mg of Lasix   Yes Historical Provider, MD  guaifenesin (ROBAFEN) 100 MG/5ML syrup Take 200 mg by mouth every 6 (six) hours as needed for cough.   Yes Historical Provider, MD  hydrALAZINE (APRESOLINE) 100 MG tablet Take 1 tablet (100 mg total) by mouth every 8 (eight) hours.  11/03/14  Yes Jessica U Vann, DO  insulin glargine (LANTUS) 100 UNIT/ML injection Inject 16 Units into the skin at bedtime.    Yes Historical Provider, MD  insulin lispro (HUMALOG KWIKPEN) 100 UNIT/ML KiwkPen Inject 0-12 Units into the skin 4 (four) times daily. Sliding scale 0-150= 0 units 151-200= 2 units 201-250= 4 units 251-300= 6 units 301-350= 8 units 351-400= 10 units 401-450= 12 units 451< contact MD   Yes Historical Provider, MD  isosorbide dinitrate (ISORDIL) 20 MG tablet Take 20 mg by mouth 3 (three) times daily.   Yes Historical Provider, MD  latanoprost (XALATAN) 0.005 % ophthalmic solution Place 1 drop into the right eye at bedtime.   Yes Historical Provider, MD  levothyroxine (SYNTHROID, LEVOTHROID) 100 MCG tablet Take 1 tablet (100 mcg total) by mouth daily before breakfast. 11/26/14  Yes Renae Fickle, MD  loperamide (IMODIUM) 2 MG capsule Take 2 mg by mouth as needed for diarrhea or loose stools.   Yes Historical Provider, MD  magnesium hydroxide (MILK OF MAGNESIA) 400 MG/5ML suspension Take 30 mLs by mouth at bedtime as needed for mild constipation.   Yes Historical Provider, MD  Melatonin 10 MG CAPS Take 10 mg by mouth every evening.    Yes Historical Provider, MD  Nutritional Supplements (NUTRITIONAL DRINK PO) Take 1 each by mouth 3 (three) times daily. Mighty shakes   Yes Historical Provider, MD  oxyCODONE-acetaminophen (PERCOCET/ROXICET) 5-325 MG per tablet Take 1 tablet by mouth every 4 (four) hours as needed for severe pain. Patient taking differently: Take 1 tablet by mouth 3 (three) times daily. [id number] 03/04/15  Yes Renae Fickle, MD  pantoprazole (PROTONIX) 20 MG tablet Take 20 mg by mouth daily.   Yes Historical Provider, MD  PARoxetine (PAXIL) 20 MG tablet Take 20 mg by mouth daily.   Yes Historical Provider, MD  polyvinyl alcohol (LIQUIFILM TEARS) 1.4 % ophthalmic solution Place 1 drop into the left eye 4 (four) times daily.   Yes Historical Provider, MD   prednisoLONE acetate (PRED FORTE) 1 % ophthalmic suspension Place 1 drop into the left eye 4 (four) times daily. 09/27/14  Yes Historical Provider, MD  QUEtiapine (SEROQUEL) 25 MG tablet Take 12.5 mg by mouth 2 (two) times daily.   Yes Historical Provider, MD  spironolactone (ALDACTONE) 25 MG tablet Take 0.5 tablets (12.5 mg total) by mouth daily. 07/14/15  Yes Dolores Patty, MD  traMADol (ULTRAM) 50 MG tablet Take 50 mg by mouth every 12 (twelve) hours as needed for moderate pain.  03/24/15  Yes Sharee Holster, NP  Vitamin D, Ergocalciferol, (DRISDOL) 50000 UNITS CAPS capsule Take 50,000 Units by mouth See admin instructions. On  the 27th day of each month   Yes Historical Provider, MD  neomycin-bacitracin-polymyxin (NEOSPORIN) ointment Apply 1 application topically as directed. Apply to skin tear as needed until healed    Historical Provider, MD   Liver Function Tests No results for input(s): AST, ALT, ALKPHOS, BILITOT, PROT, ALBUMIN in the last 168 hours. No results for input(s): LIPASE, AMYLASE in the last 168 hours. CBC  Recent Labs Lab 01/15/16 2033 01/16/16 0607 01/17/16 0748 01/18/16 0524  WBC 21.3* 19.1* 13.8* 12.3*  NEUTROABS 19.0*  --   --   --   HGB 9.9* 9.2* 8.9* 9.0*  HCT 30.3* 27.7* 27.6* 28.0*  MCV 90.7 88.2 90.5 92.1  PLT 439* 418* 355 349   Basic Metabolic Panel  Recent Labs Lab 01/15/16 2013 01/16/16 0607 01/17/16 0748 01/18/16 0524  NA 135 137 133* 137  K 3.9 3.7 3.6 3.2*  CL 93* 98* 100* 105  CO2 GLUCOSE 305* 227* 135* 106*  BUN 92* 83* 68* 57*  CREATININE 4.79* 4.17* 3.65* 3.22*  CALCIUM 9.4 8.7* 8.2* 8.5*    Filed Vitals:   01/18/16 0533 01/18/16 0942 01/18/16 2017 01/19/16 0409  BP: 151/60 156/76 189/95 173/71  Pulse: 57 57 79 73  Temp: 98.2 F (36.8 C) 98.5 F (36.9 C) 99.9 F (37.7 C) 99 F (37.2 C)  TempSrc: Oral Axillary Oral Oral  Resp: Height:      Weight:      SpO2: 100% 100% 100% 100%    Exam Elderly WF , eyes closed but responds to questions approp, no distress No rash, cyanosis or gangrene Sclera anicteric, throat clear and dry  No jvd or bruits Chest clear bilat RRR no MRG, sternotomy scar Abd soft ntnd no mass or ascites +bs GU defer MS no joint effusions or deformity Ext no LE or UE edema / no wounds or ulcers Neuro is awake, moving all ext, follows most commands, confused  UA 5/13 - turbid, many bact, large Hb/ lrg LE/ neg nit/ 5.0, tntc WBC/ RBC, 0-5 epis Urine culture 5/13 - no growth CXR 5/13 - clear Renal US - 9.5/ 10.5 cm, cortical thinning, ^'d cortical echo, no hydro   Assessment: 1. Acute on CKD IV - due to vol depletion, improving creat. Cont to hold diuretic, cont IVF's 2. CKD IV baseline creat 2.2- 2.5 3. Vol depletion - still has room for volume 4. AMS - multifactorial, doubt uremia, sedating meds now dc'd 5. HTN - cont meds 6. DM2 per primary   Plan - cont IVF"s, no indication for dialysis  Vinson Moselle MD [facility] pager [phone]    cell [phone] 01/19/2016, 8:49 AM

## 2016-01-19 NOTE — [place] FL2 (Signed)
[place] MEDICAID FL2 LEVEL OF CARE SCREENING TOOL     IDENTIFICATION  Patient Name: Taylor Padilla Birthdate: 08/14/1946 Sex: female Admission Date (Current Location): 01/15/2016  [facility] and [place] Number:  Producer, television/film/video and Address:  [facility],  [address] [place] [address]      Provider Number: [id number]  Attending Physician Name and Address:  Calvert Cantor, MD  Relative Name and Phone Number:       Current Level of Care: Hospital Recommended Level of Care: Assisted Living Facility Prior Approval Number:    Date Approved/Denied:   PASRR Number:    Discharge Plan: Other (Comment) (return to ALF)    Current Diagnoses: Patient Active Problem List   Diagnosis Date Noted  . Acute encephalopathy 01/18/2016  . Acute kidney injury (HCC) 01/15/2016  . Anemia due to other cause 03/07/2015  . UTI (urinary tract infection) 03/04/2015  . Right acetabular fracture (HCC) 03/02/2015  . Chronic diastolic heart failure (HCC) 12/31/2014  . AKI (acute kidney injury) (HCC)   . Staphylococcus aureus bacteremia 11/23/2014  . Fever   . Acute diastolic heart failure, NYHA class 1 (HCC) 11/19/2014  . Acute on chronic diastolic heart failure (HCC)   . Coronary artery disease involving native coronary artery of native heart without angina pectoris   . Essential hypertension   . CKD (chronic kidney disease) stage 4, GFR 15-29 ml/min (HCC) 10/07/2014  . Hypothyroidism 10/07/2014  . Dyslipidemia 10/07/2014  . GERD (gastroesophageal reflux disease) 10/07/2014  . Acute respiratory failure with hypoxia (HCC) 10/07/2014  . Sinus bradycardia 10/07/2014  . Anemia 10/07/2014  . Diabetes mellitus type II, uncontrolled (HCC)   . Hypertension   . CAD (coronary artery disease)     Orientation RESPIRATION BLADDER Height & Weight     Self, Place  O2 (3L) Incontinent Weight: 131 lb 9.8 oz (59.7 kg) Height:   (154.9 cm)  BEHAVIORAL SYMPTOMS/MOOD NEUROLOGICAL  BOWEL NUTRITION STATUS  Other (Comment) ( none)   Incontinent Diet (regular)  AMBULATORY STATUS COMMUNICATION OF NEEDS Skin   Limited Assist Verbally Normal                       Personal Care Assistance Level of Assistance  Bathing, Feeding, Dressing Bathing Assistance: Limited assistance Feeding assistance: Limited assistance Dressing Assistance: Limited assistance     Functional Limitations Info  Sight, Hearing, Speech Sight Info: Impaired (blind) Hearing Info: Adequate Speech Info: Adequate    SPECIAL CARE FACTORS FREQUENCY  PT (By licensed PT), OT (By licensed OT)     PT Frequency: 5 OT Frequency: 5            Contractures Contractures Info: Not present    Additional Factors Info  Code Status, Allergies, Isolation Precautions Code Status Info: Full Allergies Info: Epinephrine     Isolation Precautions Info: On contact for MRSA      Current Medications (01/19/2016):  This is the current hospital active medication list Current Facility-Administered Medications  Medication Dose Route Frequency Provider Last Rate Last Dose  . 0.9 %  sodium chloride infusion   Intravenous Continuous Belkys A Regalado, MD 100 mL/hr at 01/19/16 0830    . acetaminophen (TYLENOL) tablet 650 mg  650 mg Oral Q6H PRN Ron Parker, MD   650 mg at 01/16/16 2119   Or  . acetaminophen (TYLENOL) suppository 650 mg  650 mg Rectal Q6H PRN Ron Parker, MD      .  atorvastatin (LIPITOR) tablet 80 mg  80 mg Oral QHS Ron ParkerHarvette C Jenkins, MD   80 mg at 01/18/16 2113  . cefpodoxime (VANTIN) tablet 200 mg  200 mg Oral Q12H Calvert CantorSaima Rizwan, MD      . Chlorhexidine Gluconate Cloth 2 % PADS 6 each  6 each Topical Q0600 Ron ParkerHarvette C Jenkins, MD   6 each at 01/19/16 0800  . cloNIDine (CATAPRES) tablet 0.1 mg  0.1 mg Oral BID Ron ParkerHarvette C Jenkins, MD   0.1 mg at 01/19/16 1016  . clopidogrel (PLAVIX) tablet 75 mg  75 mg Oral Daily Ron ParkerHarvette C Jenkins, MD   75 mg at 01/19/16 1015  . dorzolamide-timolol  (COSOPT) 22.3-6.8 MG/ML ophthalmic solution 1 drop  1 drop Both Eyes Q12H Ron ParkerHarvette C Jenkins, MD   1 drop at 01/19/16 1022  . ferrous sulfate tablet 325 mg  325 mg Oral BID WC Ron ParkerHarvette C Jenkins, MD   325 mg at 01/19/16 0826  . heparin injection 5,000 Units  5,000 Units Subcutaneous Q8H Ron ParkerHarvette C Jenkins, MD   5,000 Units at 01/19/16 1310  . insulin aspart (novoLOG) injection 0-5 Units  0-5 Units Subcutaneous QHS Ron ParkerHarvette C Jenkins, MD   4 Units at 01/16/16 0119  . insulin aspart (novoLOG) injection 0-9 Units  0-9 Units Subcutaneous TID WC Ron ParkerHarvette C Jenkins, MD   1 Units at 01/19/16 1310  . isosorbide dinitrate (ISORDIL) tablet 20 mg  20 mg Oral TID Belkys A Regalado, MD   20 mg at 01/19/16 1015  . [START ON 01/20/2016] levothyroxine (SYNTHROID, LEVOTHROID) tablet 100 mcg  100 mcg Oral QAC breakfast Jamse MeadJigna M Gadhia, RPH      . mupirocin ointment (BACTROBAN) 2 % 1 application  1 application Nasal BID Ron ParkerHarvette C Jenkins, MD   1 application at 01/19/16 1024  . ondansetron (ZOFRAN) tablet 4 mg  4 mg Oral Q6H PRN Ron ParkerHarvette C Jenkins, MD       Or  . ondansetron (ZOFRAN) injection 4 mg  4 mg Intravenous Q6H PRN Ron ParkerHarvette C Jenkins, MD      . pantoprazole (PROTONIX) EC tablet 20 mg  20 mg Oral Daily Ron ParkerHarvette C Jenkins, MD   20 mg at 01/19/16 1016  . polyvinyl alcohol (LIQUIFILM TEARS) 1.4 % ophthalmic solution 1 drop  1 drop Left Eye QID Ron ParkerHarvette C Jenkins, MD   1 drop at 01/19/16 1312  . potassium chloride SA (K-DUR,KLOR-CON) CR tablet 20 mEq  20 mEq Oral Once Leda GauzeKaren J Kirby-Graham, NP   20 mEq at 01/18/16 1000  . prednisoLONE acetate (PRED FORTE) 1 % ophthalmic suspension 1 drop  1 drop Left Eye QID Ron ParkerHarvette C Jenkins, MD   1 drop at 01/19/16 1312     Discharge Medications: Please see discharge summary for a list of discharge medications.  Relevant Imaging Results:  Relevant Lab Results:   Additional Information Physical therapy at facility: ALF  Raye SorrowCoble, Candi Profit N, LCSW

## 2016-01-19 NOTE — Progress Notes (Signed)
PROGRESS NOTE    Taylor Padilla  NWG:[id number] DOB: Sep 20, 1945 DOA: 01/15/2016  PCP: No PCP Per Patient    Brief Narrative:  Taylor Padilla is a 70 y.o. female with a history of CAD, Diastolic CHF, HTN, DM2 who [facility] due to increased weakness and poor intake of foods and liquids for the past 4 days. She was found to have an elevated BUN/Cr of 92/4.79 and a positive UA. Despite her Cr improving, she was quite lethargic on 5/16. Her Xanax and Seroquel were held.   Subjective: Tells me she feels great this AM. Appears oriented. No complaints of nausea, vomiting, pain dyspnea cough.   Assessment & Plan:   Principal Problem:   Acute kidney injury (HCC)CKD 4 - due to volume depletion from diuretics and poor PO intake - continues to improve- renal consulted and seen by Dr Arlean HoppingSchertz who agrees with IVF and holding Lasix and Aldactone - Cr is now down to her baseline  Active Problems:   Chronic diastolic heart failure (HCC) - follow Resp status while lasix and aldactone on hold and hydration- asked RN to check pulse ox- 98% on room air today-  - ECHO 3/16- Mod MR, grade 2 dd, severe pulm HTN, RV dilatation - last seen in HF clinic on 11/16  Acute encephalopathy - holding Xanax and Seroquel ( 25 mg BID) as of 5/16- follow for Xanax withdrawal - takes 0.25 BID  Anemia - on Iron as outpt- having black stools- Hb at baseline    Diabetes mellitus type II, uncontrolled (HCC) - cont lantus and SSI    CAD (coronary artery disease) s/p CABG - on Plavix, statin    Essential hypertension - Clonidine Imdur    UTI (urinary tract infection) - Rocephin started 5/13- change to Vantin   DVT prophylaxis: Heparin Code Status: full code Family Communication:  Disposition Plan: SNF vs Home  Consultants:   nephro Procedures:    Antimicrobials:  Anti-infectives    Start     Dose/Rate Route Frequency Ordered Stop   01/16/16  2200  cefTRIAXone (ROCEPHIN) 1 g in dextrose 5 % 50 mL IVPB     1 g 100 mL/hr over 30 Minutes Intravenous Every 24 hours 01/15/16 2302     01/15/16 2230  cefTRIAXone (ROCEPHIN) 1 g in dextrose 5 % 50 mL IVPB     1 g 100 mL/hr over 30 Minutes Intravenous  Once 01/15/16 2219 01/15/16 2334       Objective: Filed Vitals:   01/18/16 0533 01/18/16 0942 01/18/16 2017 01/19/16 0409  BP: 151/60 156/76 189/95 173/71  Pulse: 57 57 79 73  Temp: 98.2 F (36.8 C) 98.5 F (36.9 C) 99.9 F (37.7 C) 99 F (37.2 C)  TempSrc: Oral Axillary Oral Oral  Resp: 18 18 20 20   Height:      Weight:      SpO2: 100% 100% 100% 100%    Intake/Output Summary (Last 24 hours) at 01/19/16 1209 Last data filed at 01/19/16 0536  Gross per 24 hour  Intake   1390 ml  Output    450 ml  Net    940 ml   Filed Weights   01/15/16 2351  Weight: 59.7 kg (131 lb 9.8 oz)    Examination: General exam: Appears comfortable  HEENT: PERRLA, oral mucosa moist, no sclera icterus or thrush Respiratory system: Clear to auscultation. Respiratory effort normal. Cardiovascular system: S1 & S2 heard, RRR.  No  murmurs  Gastrointestinal system: Abdomen soft, non-tender, nondistended. Normal bowel sound. No organomegaly Central nervous system: Alert and oriented. No focal neurological deficits. Extremities: No cyanosis, clubbing or edema Skin: No rashes or ulcers Psychiatry:  Mood & affect appropriate.     Data Reviewed: I have personally reviewed following labs and imaging studies  CBC:  Recent Labs Lab 01/15/16 2033 01/16/16 0607 01/17/16 0748 01/18/16 0524 01/19/16 0813  WBC 21.3* 19.1* 13.8* 12.3* 15.5*  NEUTROABS 19.0*  --   --   --   --   HGB 9.9* 9.2* 8.9* 9.0* 9.4*  HCT 30.3* 27.7* 27.6* 28.0* 28.2*  MCV 90.7 88.2 90.5 92.1 87.6  PLT 439* 418* 355 349 PLATELET CLUMPS NOTED ON SMEAR, COUNT APPEARS ADEQUATE   Basic Metabolic Panel:  Recent Labs Lab 01/15/16 2013 01/16/16 0607 01/17/16 0748  01/18/16 0524 01/19/16 0813  NA 135 137 133* 137 134*  K 3.9 3.7 3.6 3.2* 3.5  CL 93* 98* 100* 105 102  CO2 23 25 22 23  20*  GLUCOSE 305* 227* 135* 106* 150*  BUN 92* 83* 68* 57* 44*  CREATININE 4.79* 4.17* 3.65* 3.22* 2.80*  CALCIUM 9.4 8.7* 8.2* 8.5* 8.4*   GFR: Estimated Creatinine Clearance: 15.3 mL/min (by C-G formula based on Cr of 2.8). Liver Function Tests: No results for input(s): AST, ALT, ALKPHOS, BILITOT, PROT, ALBUMIN in the last 168 hours. No results for input(s): LIPASE, AMYLASE in the last 168 hours. No results for input(s): AMMONIA in the last 168 hours. Coagulation Profile: No results for input(s): INR, PROTIME in the last 168 hours. Cardiac Enzymes: No results for input(s): CKTOTAL, CKMB, CKMBINDEX, TROPONINI in the last 168 hours. BNP (last 3 results) No results for input(s): PROBNP in the last 8760 hours. HbA1C: No results for input(s): HGBA1C in the last 72 hours. CBG:  Recent Labs Lab 01/18/16 2031 01/18/16 2344 01/19/16 0412 01/19/16 0817 01/19/16 1150  GLUCAP 139* 135* 130* 149* 145*   Lipid Profile: No results for input(s): CHOL, HDL, LDLCALC, TRIG, CHOLHDL, LDLDIRECT in the last 72 hours. Thyroid Function Tests: No results for input(s): TSH, T4TOTAL, FREET4, T3FREE, THYROIDAB in the last 72 hours. Anemia Panel: No results for input(s): VITAMINB12, FOLATE, FERRITIN, TIBC, IRON, RETICCTPCT in the last 72 hours. Urine analysis:    Component Value Date/Time   COLORURINE YELLOW 01/15/2016 2112   APPEARANCEUR TURBID* 01/15/2016 2112   LABSPEC 1.015 01/15/2016 2112   PHURINE 5.0 01/15/2016 2112   GLUCOSEU 250* 01/15/2016 2112   HGBUR LARGE* 01/15/2016 2112   BILIRUBINUR NEGATIVE 01/15/2016 2112   KETONESUR NEGATIVE 01/15/2016 2112   PROTEINUR 100* 01/15/2016 2112   UROBILINOGEN 0.2 07/10/2015 0401   NITRITE NEGATIVE 01/15/2016 2112   LEUKOCYTESUR LARGE* 01/15/2016 2112   Sepsis Labs: @LABRCNTIP (procalcitonin:4,lacticidven:4)  ) Recent  Results (from the past 240 hour(s))  Urine culture     Status: None   Collection Time: 01/15/16  9:30 PM  Result Value Ref Range Status   Specimen Description URINE, CATHETERIZED  Final   Special Requests NONE  Final   Culture NO GROWTH Performed at [facility]   Final   Report Status 01/17/2016 FINAL  Final  Blood culture (routine x 2)     Status: None (Preliminary result)   Collection Time: 01/15/16 10:25 PM  Result Value Ref Range Status   Specimen Description BLOOD LEFT FOREARM  Final   Special Requests BOTTLES DRAWN AEROBIC AND ANAEROBIC 5CC  Final   Culture   Final    NO GROWTH  2 DAYS Performed at [facility]    Report Status PENDING  Incomplete  Blood culture (routine x 2)     Status: None (Preliminary result)   Collection Time: 01/15/16 10:30 PM  Result Value Ref Range Status   Specimen Description BLOOD RIGHT HAND  Final   Special Requests BOTTLES DRAWN AEROBIC AND ANAEROBIC 5CC  Final   Culture   Final    NO GROWTH 2 DAYS Performed at [facility]    Report Status PENDING  Incomplete  MRSA PCR Screening     Status: Abnormal   Collection Time: 01/16/16 12:26 AM  Result Value Ref Range Status   MRSA by PCR POSITIVE (A) NEGATIVE Final    Comment:        The GeneXpert MRSA Assay (FDA approved for NASAL specimens only), is one component of a comprehensive MRSA colonization surveillance program. It is not intended to diagnose MRSA infection nor to guide or monitor treatment for MRSA infections. RESULT CALLED TO, READ BACK BY AND VERIFIED WITH: A.LEMONS RN [phone] A.QUIZON [id number]          Radiology Studies: Ct Head Wo Contrast  01/18/2016  CLINICAL DATA:  Patient was brought in from assisted living center due to increased weakness and decreased food intake for 4 days. EXAM: CT HEAD WITHOUT CONTRAST TECHNIQUE: Contiguous axial images were obtained from the base of the skull through the vertex without intravenous contrast. COMPARISON:   October 31, 2014 FINDINGS: There is chronic diffuse atrophy. Chronic bilateral periventricular white matter small vessel ischemic change is identified. There is no midline shift, hydrocephalus, or mass. No acute hemorrhage or acute transcortical infarct is identified. The bony calvarium is intact. There is mucoperiosteal thickening of bilateral ethmoid sinus. IMPRESSION: No focal acute intracranial abnormality identified. Chronic diffuse atrophy. Chronic bilateral periventricular white matter small vessel ischemic change. Electronically Signed   By: Sherian Rein M.D.   On: 01/18/2016 15:07   US Renal  01/17/2016  CLINICAL DATA:  Renal failure, hypertension, coronary artery disease post MI, hypercholesterolemia, stage III chronic kidney disease, CHF, type II diabetes mellitus EXAM: RENAL / URINARY TRACT ULTRASOUND COMPLETE COMPARISON:  11/25/2014 FINDINGS: Right Kidney: Length: 9.5 cm. Cortical thinning. Upper cortical echogenicity. Small exophytic nodule at upper to mid kidney likely small cyst 2.3 x 1.7 x 2.2 cm, noted previously. No solid mass or hydronephrosis. Left Kidney: Length: 10.5 cm. Cortical thinning. Increased cortical echogenicity. Questionable small hypoechoic nodule at lower pole versus artifact, poorly visualized, 1.4 x 1.2 x 1.6 cm, complex cyst not excluded. A cyst was identified at the inferior pole LEFT kidney on the previous exam, slightly larger than the current finding. No additional or enlarging mass, or evidence of hydronephrosis. Bladder: Normal appearance IMPRESSION: BILATERAL renal cortical atrophy. Small RIGHT renal cysts, with complicated cystic lesion at inferior pole LEFT kidney less well visualized than on previous study. Electronically Signed   By: Ulyses Southward M.D.   On: 01/17/2016 23:35        Scheduled Meds: . atorvastatin  80 mg Oral QHS  . cefTRIAXone (ROCEPHIN)  IV  1 g Intravenous Q24H  . Chlorhexidine Gluconate Cloth  6 each Topical Q0600  . cloNIDine  0.1 mg  Oral BID  . clopidogrel  75 mg Oral Daily  . dorzolamide-timolol  1 drop Both Eyes Q12H  . ferrous sulfate  325 mg Oral BID WC  . heparin  5,000 Units Subcutaneous Q8H  . insulin aspart  0-5 Units Subcutaneous QHS  .  insulin aspart  0-9 Units Subcutaneous TID WC  . isosorbide dinitrate  20 mg Oral TID  . [START ON 01/20/2016] levothyroxine  100 mcg Oral QAC breakfast  . mupirocin ointment  1 application Nasal BID  . pantoprazole  20 mg Oral Daily  . polyvinyl alcohol  1 drop Left Eye QID  . potassium chloride  20 mEq Oral Once  . prednisoLONE acetate  1 drop Left Eye QID   Continuous Infusions: . sodium chloride 100 mL/hr at 01/19/16 0830     LOS: 4 days    Time spent in minutes: 35    Jaydian Santana, MD [facility] Pager: [email or web address] Password [facility] 01/19/2016, 12:09 PM

## 2016-01-19 NOTE — Progress Notes (Signed)
[facility] Progress Note   Subjective: still confused, disoriented. Creat down 2.8  Filed Vitals:   01/18/16 0942 01/18/16 2017 01/19/16 0409 01/19/16 1216  BP: 156/76 189/95 173/71   Pulse: 57 79 73   Temp: 98.5 F (36.9 C) 99.9 F (37.7 C) 99 F (37.2 C)   TempSrc: Axillary Oral Oral   Resp: 18 20 20    Height:      Weight:      SpO2: 100% 100% 100% 98%    Inpatient medications: . atorvastatin  80 mg Oral QHS  . cefpodoxime  200 mg Oral Q12H  . Chlorhexidine Gluconate Cloth  6 each Topical Q0600  . cloNIDine  0.1 mg Oral BID  . clopidogrel  75 mg Oral Daily  . dorzolamide-timolol  1 drop Both Eyes Q12H  . ferrous sulfate  325 mg Oral BID WC  . heparin  5,000 Units Subcutaneous Q8H  . insulin aspart  0-5 Units Subcutaneous QHS  . insulin aspart  0-9 Units Subcutaneous TID WC  . isosorbide dinitrate  20 mg Oral TID  . [START ON 01/20/2016] levothyroxine  100 mcg Oral QAC breakfast  . mupirocin ointment  1 application Nasal BID  . pantoprazole  20 mg Oral Daily  . polyvinyl alcohol  1 drop Left Eye QID  . potassium chloride  20 mEq Oral Once  . prednisoLONE acetate  1 drop Left Eye QID   . sodium chloride 100 mL/hr at 01/19/16 0830   acetaminophen **OR** acetaminophen, ondansetron **OR** ondansetron (ZOFRAN) IV  Exam: Elderly WF , eyes closed but responds to questions approp, no distress No rash, cyanosis or gangrene Sclera anicteric, throat clear and dry  No jvd or bruits Chest clear bilat RRR no MRG, sternotomy scar Abd soft ntnd no mass or ascites +bs GU defer MS no joint effusions or deformity Ext no LE or UE edema / no wounds or ulcers Neuro is awake, moving all ext, follows most commands, confused  UA 5/13 - turbid, many bact, large Hb/ lrg LE/ neg nit/ 5.0, tntc WBC/ RBC, 0-5 epis Urine culture 5/13 - no growth CXR 5/13 - clear Renal US - 9.5/ 10.5 cm, cortical thinning, ^'d cortical echo, no hydro   Assessment: 1. Acute on CKD IV  - resolving w IVF's. Creat 2.8 2. CKD IV baseline creat 2.2- 2.5 3. Vol depletion - cont IVF 4. AMS - doubt uremia, prob medication related 5. HTN - on clonidine 6. DM2 per primary  Plan - will sign off.    Vinson Moselleob Coyt Govoni MD [facility] pager [phone]    cell [phone] 01/19/2016, 12:24 PM    Recent Labs Lab 01/17/16 0748 01/18/16 0524 01/19/16 0813  NA 133* 137 134*  K 3.6 3.2* 3.5  CL 100* 105 102  CO2 22 23 20*  GLUCOSE 135* 106* 150*  BUN 68* 57* 44*  CREATININE 3.65* 3.22* 2.80*  CALCIUM 8.2* 8.5* 8.4*   No results for input(s): AST, ALT, ALKPHOS, BILITOT, PROT, ALBUMIN in the last 168 hours.  Recent Labs Lab 01/15/16 2033  01/17/16 0748 01/18/16 0524 01/19/16 0813  WBC 21.3*  < > 13.8* 12.3* 15.5*  NEUTROABS 19.0*  --   --   --   --   HGB 9.9*  < > 8.9* 9.0* 9.4*  HCT 30.3*  < > 27.6* 28.0* 28.2*  MCV 90.7  < > 90.5 92.1 87.6  PLT 439*  < > 355 349 PLATELET CLUMPS NOTED ON SMEAR, COUNT APPEARS ADEQUATE  < > =  values in this interval not displayed.

## 2016-01-19 NOTE — Clinical Social Work Note (Signed)
Clinical Social Work Assessment  Patient Details  Name: Taylor Padilla MRN: [id number] Date of Birth: October 14, 1945  Date of referral:  01/19/16               Reason for consult:  Discharge Planning, Other (Comment Required) (Assisted Living: [facility])                Permission sought to share information with:  Case Manager, Magazine features editor, Family Supports Permission granted to share information::  Yes, Verbal Permission Granted  Name::        Agency::  ALF: [employer] [facility]  Relationship::  sister in Social worker (POA) Hotel manager Information:     Housing/Transportation Living arrangements for the past 2 months:  Assisted Dealer of Information:  Medical Team, Facility, Adult Children Patient Interpreter Needed:  None Criminal Activity/Legal Involvement Pertinent to Current Situation/Hospitalization:  No - Comment as needed Significant Relationships:  Merchandiser, retail, Other Family Members, Adult Children Lives with:  Facility Resident Do you feel safe going back to the place where you live?  Yes Need for family participation in patient care:  Yes (Comment)  Care giving concerns:  Patient admitted from [facility] where she has been living since Feb 2017.  Patient was living at [facility] prior to current facility before moving due to running out of funds to pay for ALF.  Currently she is in a locked unit and baseline is independent in wheelchair.  Patient is able to feed herself and at times will be stubborn and fiesty per family.  In the last 2 weeks, patient has declined with wanting to feed herself, motivated to go to her activities at facility and fighting back with staff or family when she does not want to do something. Family feels this could be secondary to her illness and medical problems at this time. Facility is in agreement. Facility reports they are able to accomodates [facility] PT at facility as they have a physical therapist in house that  works daily.  Family is wanting her to return and very happy with care.  Family working to not admit to SNF, patient was in SNF in 2016 due to a fall and went to [place]. Patient is not able to have her stuff thus wants to be at ALF and regain independence.   Social Worker assessment / plan:  LCSW completed assessment with family via phone as none present in room and spoke with facility. Updated facility and they are in agreement to accept her back. PT added to [facility] and pt's recent labs indicating infection. LCSW will continue to follow for disposition of needs and assist patient back to ALF.  Employment status:  Retired Database administrator PT Recommendations:  Skilled Holiday representative, Home with Home Health (can return to facility with HHPT) Information / Referral to community resources:  Other (Comment Required) (discussed goals of care if patient continues to decline)  Patient/Family's Response to care:  Agreeable to plan  Patient/Family's Understanding of and Emotional Response to Diagnosis, Current Treatment, and Prognosis:  Family expresses concern for patient and hoping she has not given up on life. Understand her depression could play a part in reason for not wanting to eat, but hopeful this is secondary to UTI and infection.    Emotional Assessment Appearance:  Appears stated age Attitude/Demeanor/Rapport:  Other (stubborn at times and restless) Affect (typically observed):  Apprehensive, Frustrated, Stable Orientation:  Oriented to Self Alcohol / Substance use:  Not Applicable  Psych involvement (Current and /or in the community):  No (Comment)  Discharge Needs  Concerns to be addressed:  No discharge needs identified Readmission within the last 30 days:  No Current discharge risk:  None Barriers to Discharge:  No Barriers Identified   Raye SorrowCoble, Amr [place] N, LCSW 01/19/2016, 1:15 PM

## 2016-01-19 NOTE — Progress Notes (Signed)
Pharmacy IV to PO conversion  The patient is receiving Levothyroxine by the intravenous route.  Based on criteria approved by the Pharmacy and Therapeutics Committee and the Medical Executive Committee, the medication is being converted to the equivalent oral dose form.   No active GI bleeding or impaired absorption  Not s/p esophagectomy  Documented ability to take oral medications for > 24 hr  Plan to continue treatment for at least 1 day  If you have any questions about this conversion, please contact the Pharmacy Department (ext [phone]).  Thank you.   Greer PickerelJigna Oumar Marcott, PharmD, BCPS Pager: [phone] 01/19/2016 10:59 AM

## 2016-01-19 NOTE — Progress Notes (Signed)
Pt O2 sats 98% Room Air. Justin Mendaudle, Rabiah Goeser H, RN

## 2016-01-19 NOTE — Evaluation (Signed)
Physical Therapy Evaluation Patient Details Name: Taylor [name] MRN: [id number] DOB: 1946/05/23 Today's Date: 01/19/2016   History of Present Illness  70 yo female admitted with acute kidney injury. hx of htn, cad, chf, ckd, mi, dm. cva. cabg, R acetbular/R inferior pubic ramus fx.   Clinical Impression  On eval, pt required Mod-Max assist for mobility. Pt stood briefly with RW for ~10 seconds but she was unable to take any steps, pivotal or otherwise. Recommend SNF vs ALF depending on level of assist ALF is able to provide. Family would like pt to be able to return to ALF if possible.     Follow Up Recommendations SNF vs Home health PT (unless ALF feels they can provide current level of care. If back to ALF, then HHPT trial)    Equipment Recommendations  None recommended by PT    Recommendations for Other Services       Precautions / Restrictions Precautions Precautions: Fall Restrictions Weight Bearing Restrictions: No      Mobility  Bed Mobility Overal bed mobility: Needs Assistance Bed Mobility: Supine to Sit     Supine to sit: Max assist;Mod assist     General bed mobility comments: Max assist for supine to sit (partially due to limited participation). Mod assist for sit to supine. Assist for trunk and bil LEs. Increased time.  Transfers Overall transfer level: Needs assistance Equipment used: Rolling walker (2 wheeled) Transfers: Sit to/from Stand Sit to Stand: Mod assist;From elevated surface         General transfer comment: Assist to rise, stabilize, control descent. Stood x2 for ~10 seconds each time. Attempted to have pt take steps however pt was unable/unwilling. Assisted back to bed at pt's request.   Ambulation/Gait             General Gait Details: NT-pt is non ambulatory  Stairs            Wheelchair Mobility    Modified Rankin (Stroke Patients Only)       Balance Overall balance assessment: Needs assistance Sitting-balance  support: Feet supported Sitting balance-Leahy Scale: Fair Sitting balance - Comments: Sat EOB ~7-8 minutes with Min guard assist once positioned appropriately at EOB.                                      Pertinent Vitals/Pain Pain Assessment: No/denies pain    Home Living Family/patient expects to be discharged to:: Unsure                 Additional Comments: Pt is from an ALF    Prior Function Level of Independence: Needs assistance   Gait / Transfers Assistance Needed: non ambulatory. per family, pt transferred to/from wheelchair with assistance.   ADL's / Homemaking Assistance Needed: assist needed for ADLs        Hand Dominance        Extremity/Trunk Assessment   Upper Extremity Assessment: Generalized weakness           Lower Extremity Assessment: Generalized weakness      Cervical / Trunk Assessment: Kyphotic  Communication   Communication: No difficulties  Cognition Arousal/Alertness:  (pt tended to keep her eyes open) Behavior During Therapy: Flat affect Overall Cognitive Status: History of cognitive impairments - at baseline                      General Comments  Exercises        Assessment/Plan    PT Assessment Patient needs continued PT services  PT Diagnosis Generalized weakness;Abnormality of gait   PT Problem List Decreased strength;Decreased activity tolerance;Decreased balance;Decreased mobility;Decreased knowledge of use of DME;Decreased cognition  PT Treatment Interventions Functional mobility training;Therapeutic activities;Patient/family education;Balance training;Therapeutic exercise;DME instruction   PT Goals (Current goals can be found in the Care Plan section) Acute Rehab PT Goals Patient Stated Goal: none stated. Family would like pt to return to ALF PT Goal Formulation: With family Time For Goal Achievement: 02/02/16 Potential to Achieve Goals: Fair    Frequency Min 2X/week   Barriers  to discharge        Co-evaluation               End of Session Equipment Utilized During Treatment: Gait belt Activity Tolerance: Patient tolerated treatment well Patient left: in bed;with call bell/phone within reach;with bed alarm set;with family/visitor present           Time: 1610-9604 PT Time Calculation (min) (ACUTE ONLY): 30 min   Charges:   PT Evaluation $PT Eval Low Complexity: 1 Procedure PT Treatments $Therapeutic Activity: 8-22 mins   PT G Codes:        Rebeca Alert, MPT Pager: [phone]

## 2016-01-20 DIAGNOSIS — I10 Essential (primary) hypertension: Secondary | ICD-10-CM

## 2016-01-20 LAB — BASIC METABOLIC PANEL
Anion gap: 8 (ref 5–15)
BUN: 35 mg/dL — ABNORMAL HIGH (ref 6–20)
CHLORIDE: 106 mmol/L (ref 101–111)
CO2: 19 mmol/L — AB (ref 22–32)
CREATININE: 2.28 mg/dL — AB (ref 0.44–1.00)
Calcium: 7.9 mg/dL — ABNORMAL LOW (ref 8.9–10.3)
GFR calc non Af Amer: 20 mL/min — ABNORMAL LOW (ref >60)
GFR, EST AFRICAN AMERICAN: 24 mL/min — AB (ref >60)
Glucose, Bld: 156 mg/dL — ABNORMAL HIGH (ref 65–99)
Potassium: 3.1 mmol/L — ABNORMAL LOW (ref 3.5–5.1)
Sodium: 133 mmol/L — ABNORMAL LOW (ref 135–145)

## 2016-01-20 LAB — CBC
HEMATOCRIT: 25.7 % — AB (ref 36.0–46.0)
HEMOGLOBIN: 8.5 g/dL — AB (ref 12.0–15.0)
MCH: 29.6 pg (ref 26.0–34.0)
MCHC: 33.1 g/dL (ref 30.0–36.0)
MCV: 89.5 fL (ref 78.0–100.0)
Platelets: 389 10*3/uL (ref 150–400)
RBC: 2.87 MIL/uL — ABNORMAL LOW (ref 3.87–5.11)
RDW: 13.3 % (ref 11.5–15.5)
WBC: 12.5 10*3/uL — ABNORMAL HIGH (ref 4.0–10.5)

## 2016-01-20 LAB — GLUCOSE, CAPILLARY
GLUCOSE-CAPILLARY: 121 mg/dL — AB (ref 65–99)
GLUCOSE-CAPILLARY: 126 mg/dL — AB (ref 65–99)
Glucose-Capillary: 110 mg/dL — ABNORMAL HIGH (ref 65–99)
Glucose-Capillary: 121 mg/dL — ABNORMAL HIGH (ref 65–99)
Glucose-Capillary: 147 mg/dL — ABNORMAL HIGH (ref 65–99)
Glucose-Capillary: 153 mg/dL — ABNORMAL HIGH (ref 65–99)
Glucose-Capillary: 157 mg/dL — ABNORMAL HIGH (ref 65–99)

## 2016-01-20 MED ORDER — DRONABINOL 2.5 MG PO CAPS
2.5000 mg | ORAL_CAPSULE | Freq: Two times a day (BID) | ORAL | Status: DC
  Administered 2016-01-20 – 2016-01-21 (×2): 2.5 mg via ORAL
  Filled 2016-01-20 (×2): qty 1

## 2016-01-20 MED ORDER — POTASSIUM CHLORIDE CRYS ER 20 MEQ PO TBCR
40.0000 meq | EXTENDED_RELEASE_TABLET | ORAL | Status: AC
  Administered 2016-01-20 (×2): 40 meq via ORAL
  Filled 2016-01-20 (×2): qty 2

## 2016-01-20 NOTE — Progress Notes (Signed)
PROGRESS NOTE    Gurneet Matarese  WJX:[id number] DOB: 1946-07-08 DOA: 01/15/2016  PCP: No PCP Per Patient    Brief Narrative:  Moyinoluwa Dawe is a 70 y.o. female with a history of CAD, Diastolic CHF, HTN, DM2 who was sent to the ED from the [facility] due to increased weakness and poor intake of foods and liquids for the past 4 days. She was found to have an elevated BUN/Cr of 92/4.79 and a positive UA. Despite her Cr improving, she was quite lethargic on 5/16. Her Xanax and Seroquel were held.   Subjective: States she is doing well today. No complaints. No complaints of nausea, vomiting, pain dyspnea cough.   Assessment & Plan:   Principal Problem:   Acute kidney injury (HCC)CKD 4 - due to volume depletion from diuretics and poor PO intake - continues to improve- renal consulted and seen by Dr Arlean Hopping who agrees with IVF and holding Lasix and Aldactone - Cr is now 2.28- down to her baseline- BUN steadily improving with IVF - BUN/Cr ratio now < 20 - stop IVF  Active Problems:   Chronic diastolic heart failure (HCC) - follow Resp status while lasix and aldactone on hold and hydration- asked RN to check pulse ox- 98% on room air today-  - ECHO 3/16- Mod MR, grade 2 dd, severe pulm HTN, RV dilatation - last seen in HF clinic on 11/16  UTI (urinary tract infection) - Rocephin started 5/13- changed to Vantin  Hypokalemia - replace  Acute encephalopathy - holding Xanax(0.25 BID) and Seroquel ( 25 mg BID) as of 5/16- no Xanax withdrawal noted  - confused at baseline but has been quite alerts for the passed couple of days  Anorexia - still barely eating and drinking- not improving with treating AKI and UTI - start Marinol- if this does not help increase PO intake, may need to consider comfort care- d/w sister- will try to contact POA sister in law as well.  Anemia - on Iron as outpt- having black stools- Hb at baseline    Diabetes mellitus type II,  uncontrolled (HCC) - cont lantus and SSI    CAD (coronary artery disease) s/p CABG - on Plavix, statin    Essential hypertension - Clonidine Imdur    DVT prophylaxis: Heparin Code Status: full code Family Communication:  Disposition Plan: SNF vs Home  Consultants:   nephro Procedures:    Antimicrobials:  Anti-infectives    Start     Dose/Rate Route Frequency Ordered Stop   01/19/16 1400  cefpodoxime (VANTIN) tablet 200 mg     200 mg Oral Every 24 hours 01/19/16 1346     01/19/16 1230  cefpodoxime (VANTIN) tablet 200 mg  Status:  Discontinued     200 mg Oral Every 12 hours 01/19/16 1215 01/19/16 1346   01/16/16 2200  cefTRIAXone (ROCEPHIN) 1 g in dextrose 5 % 50 mL IVPB  Status:  Discontinued     1 g 100 mL/hr over 30 Minutes Intravenous Every 24 hours 01/15/16 2302 01/19/16 1215   01/15/16 2230  cefTRIAXone (ROCEPHIN) 1 g in dextrose 5 % 50 mL IVPB     1 g 100 mL/hr over 30 Minutes Intravenous  Once 01/15/16 2219 01/15/16 2334       Objective: Filed Vitals:   01/19/16 0409 01/19/16 1216 01/19/16 2133 01/20/16 0436  BP: 173/71  165/74 152/66  Pulse: 73  70 72  Temp: 99 F (37.2 C)  99.3 F (37.4 C) 99.2  F (37.3 C)  TempSrc: Oral  Oral Oral  Resp: Height:      Weight:      SpO2: 100% 98% 99% 97%    Intake/Output Summary (Last 24 hours) at 01/20/16 1409 Last data filed at 01/20/16 1221  Gross per 24 hour  Intake 3028.33 ml  Output    750 ml  Net 2278.33 ml   Filed Weights   01/15/16 2351  Weight: 59.7 kg (131 lb 9.8 oz)    Examination: General exam: Appears comfortable  HEENT: PERRLA, oral mucosa moist, no sclera icterus or thrush Respiratory system: Clear to auscultation. Respiratory effort normal. Cardiovascular system: S1 & S2 heard, RRR.  No murmurs  Gastrointestinal system: Abdomen soft, non-tender, nondistended. Normal bowel sound. No organomegaly Central nervous system: Alert - confused-  No focal neurological  deficits. Extremities: No cyanosis, clubbing or edema Skin: No rashes or ulcers Psychiatry:  Mood & affect appropriate.     Data Reviewed: I have personally reviewed following labs and imaging studies  CBC:  Recent Labs Lab 01/15/16 2033 01/16/16 0607 01/17/16 0748 01/18/16 0524 01/19/16 0813 01/20/16 1315  WBC 21.3* 19.1* 13.8* 12.3* 15.5* 12.5*  NEUTROABS 19.0*  --   --   --   --   --   HGB 9.9* 9.2* 8.9* 9.0* 9.4* 8.5*  HCT 30.3* 27.7* 27.6* 28.0* 28.2* 25.7*  MCV 90.7 88.2 90.5 92.1 87.6 89.5  PLT 439* 418* 355 349 PLATELET CLUMPS NOTED ON SMEAR, COUNT APPEARS ADEQUATE 389   Basic Metabolic Panel:  Recent Labs Lab 01/16/16 0607 01/17/16 0748 01/18/16 0524 01/19/16 0813 01/20/16 1315  NA 137 133* 137 134* 133*  K 3.7 3.6 3.2* 3.5 3.1*  CL 98* 100* 105 102 106  CO2 20* 19*  GLUCOSE 227* 135* 106* 150* 156*  BUN 83* 68* 57* 44* 35*  CREATININE 4.17* 3.65* 3.22* 2.80* 2.28*  CALCIUM 8.7* 8.2* 8.5* 8.4* 7.9*   GFR: Estimated Creatinine Clearance: 18.8 mL/min (by C-G formula based on Cr of 2.28). Liver Function Tests: No results for input(s): AST, ALT, ALKPHOS, BILITOT, PROT, ALBUMIN in the last 168 hours. No results for input(s): LIPASE, AMYLASE in the last 168 hours. No results for input(s): AMMONIA in the last 168 hours. Coagulation Profile: No results for input(s): INR, PROTIME in the last 168 hours. Cardiac Enzymes: No results for input(s): CKTOTAL, CKMB, CKMBINDEX, TROPONINI in the last 168 hours. BNP (last 3 results) No results for input(s): PROBNP in the last 8760 hours. HbA1C: No results for input(s): HGBA1C in the last 72 hours. CBG:  Recent Labs Lab 01/19/16 2024 01/20/16 0011 01/20/16 0436 01/20/16 0723 01/20/16 1214  GLUCAP 134* 126* 153* 147* 157*   Lipid Profile: No results for input(s): CHOL, HDL, LDLCALC, TRIG, CHOLHDL, LDLDIRECT in the last 72 hours. Thyroid Function Tests: No results for input(s): TSH, T4TOTAL, FREET4,  T3FREE, THYROIDAB in the last 72 hours. Anemia Panel: No results for input(s): VITAMINB12, FOLATE, FERRITIN, TIBC, IRON, RETICCTPCT in the last 72 hours. Urine analysis:    Component Value Date/Time   COLORURINE YELLOW 01/15/2016 2112   APPEARANCEUR TURBID* 01/15/2016 2112   LABSPEC 1.015 01/15/2016 2112   PHURINE 5.0 01/15/2016 2112   GLUCOSEU 250* 01/15/2016 2112   HGBUR LARGE* 01/15/2016 2112   BILIRUBINUR NEGATIVE 01/15/2016 2112   KETONESUR NEGATIVE 01/15/2016 2112   PROTEINUR 100* 01/15/2016 2112   UROBILINOGEN 0.2 07/10/2015 0401   NITRITE NEGATIVE 01/15/2016 2112   LEUKOCYTESUR LARGE* 01/15/2016  2112   Sepsis Labs: @LABRCNTIP (procalcitonin:4,lacticidven:4)  ) Recent Results (from the past 240 hour(s))  Urine culture     Status: None   Collection Time: 01/15/16  9:30 PM  Result Value Ref Range Status   Specimen Description URINE, CATHETERIZED  Final   Special Requests NONE  Final   Culture NO GROWTH Performed at [facility]   Final   Report Status 01/17/2016 FINAL  Final  Blood culture (routine x 2)     Status: None (Preliminary result)   Collection Time: 01/15/16 10:25 PM  Result Value Ref Range Status   Specimen Description BLOOD LEFT FOREARM  Final   Special Requests BOTTLES DRAWN AEROBIC AND ANAEROBIC 5CC  Final   Culture   Final    NO GROWTH 4 DAYS Performed at [facility]    Report Status PENDING  Incomplete  Blood culture (routine x 2)     Status: None (Preliminary result)   Collection Time: 01/15/16 10:30 PM  Result Value Ref Range Status   Specimen Description BLOOD RIGHT HAND  Final   Special Requests BOTTLES DRAWN AEROBIC AND ANAEROBIC 5CC  Final   Culture   Final    NO GROWTH 4 DAYS Performed at [facility]    Report Status PENDING  Incomplete  MRSA PCR Screening     Status: Abnormal   Collection Time: 01/16/16 12:26 AM  Result Value Ref Range Status   MRSA by PCR POSITIVE (A) NEGATIVE Final    Comment:        The  GeneXpert MRSA Assay (FDA approved for NASAL specimens only), is one component of a comprehensive MRSA colonization surveillance program. It is not intended to diagnose MRSA infection nor to guide or monitor treatment for MRSA infections. RESULT CALLED TO, READ BACK BY AND VERIFIED WITH: A.LEMONS RN [phone] A.QUIZON [id number]          Radiology Studies: Ct Head Wo Contrast  01/18/2016  CLINICAL DATA:  Patient was brought in from assisted living center due to increased weakness and decreased food intake for 4 days. EXAM: CT HEAD WITHOUT CONTRAST TECHNIQUE: Contiguous axial images were obtained from the base of the skull through the vertex without intravenous contrast. COMPARISON:  October 31, 2014 FINDINGS: There is chronic diffuse atrophy. Chronic bilateral periventricular white matter small vessel ischemic change is identified. There is no midline shift, hydrocephalus, or mass. No acute hemorrhage or acute transcortical infarct is identified. The bony calvarium is intact. There is mucoperiosteal thickening of bilateral ethmoid sinus. IMPRESSION: No focal acute intracranial abnormality identified. Chronic diffuse atrophy. Chronic bilateral periventricular white matter small vessel ischemic change. Electronically Signed   By: Sherian Rein M.D.   On: 01/18/2016 15:07        Scheduled Meds: . atorvastatin  80 mg Oral QHS  . cefpodoxime  200 mg Oral Q24H  . cloNIDine  0.1 mg Oral BID  . clopidogrel  75 mg Oral Daily  . dorzolamide-timolol  1 drop Both Eyes Q12H  . ferrous sulfate  325 mg Oral BID WC  . heparin  5,000 Units Subcutaneous Q8H  . insulin aspart  0-5 Units Subcutaneous QHS  . insulin aspart  0-9 Units Subcutaneous TID WC  . isosorbide dinitrate  20 mg Oral TID  . levothyroxine  100 mcg Oral QAC breakfast  . pantoprazole  20 mg Oral Daily  . polyvinyl alcohol  1 drop Left Eye QID  . potassium chloride  20 mEq Oral Once  . prednisoLONE acetate  1 drop Left Eye QID    Continuous Infusions: . sodium chloride 100 mL/hr at 01/20/16 0810     LOS: 5 days    Time spent in minutes: 35    Angles Trevizo, MD [facility] Pager: [email or web address] Password [email or web address] 01/20/2016, 2:09 PM

## 2016-01-20 NOTE — Progress Notes (Signed)
Date:  Jan 20, 2016 Chart reviewed for concurrent status and case management needs. Will continue to follow patient for changes and needs: bun, creat. And wbc remain elevated Expected discharge date: [id number] Marcelle SmilingRhonda Davis, BSN, PrentissRN3, [place]CCM   [phone]

## 2016-01-21 ENCOUNTER — Inpatient Hospital Stay (HOSPITAL_COMMUNITY): Payer: Medicare Other

## 2016-01-21 DIAGNOSIS — I2510 Atherosclerotic heart disease of native coronary artery without angina pectoris: Secondary | ICD-10-CM

## 2016-01-21 LAB — BASIC METABOLIC PANEL
Anion gap: 6 (ref 5–15)
BUN: 31 mg/dL — AB (ref 6–20)
CALCIUM: 8.4 mg/dL — AB (ref 8.9–10.3)
CO2: 20 mmol/L — AB (ref 22–32)
CREATININE: 2.21 mg/dL — AB (ref 0.44–1.00)
Chloride: 107 mmol/L (ref 101–111)
GFR calc Af Amer: 25 mL/min — ABNORMAL LOW (ref >60)
GFR calc non Af Amer: 21 mL/min — ABNORMAL LOW (ref >60)
GLUCOSE: 163 mg/dL — AB (ref 65–99)
Potassium: 4.6 mmol/L (ref 3.5–5.1)
Sodium: 133 mmol/L — ABNORMAL LOW (ref 135–145)

## 2016-01-21 LAB — GLUCOSE, CAPILLARY
GLUCOSE-CAPILLARY: 130 mg/dL — AB (ref 65–99)
GLUCOSE-CAPILLARY: 155 mg/dL — AB (ref 65–99)
Glucose-Capillary: 147 mg/dL — ABNORMAL HIGH (ref 65–99)

## 2016-01-21 LAB — CBC
HCT: 27.5 % — ABNORMAL LOW (ref 36.0–46.0)
Hemoglobin: 9 g/dL — ABNORMAL LOW (ref 12.0–15.0)
MCH: 29.4 pg (ref 26.0–34.0)
MCHC: 32.7 g/dL (ref 30.0–36.0)
MCV: 89.9 fL (ref 78.0–100.0)
PLATELETS: 389 10*3/uL (ref 150–400)
RBC: 3.06 MIL/uL — ABNORMAL LOW (ref 3.87–5.11)
RDW: 13.3 % (ref 11.5–15.5)
WBC: 11.4 10*3/uL — ABNORMAL HIGH (ref 4.0–10.5)

## 2016-01-21 LAB — ECHOCARDIOGRAM COMPLETE
Height: 61 in
WEIGHTICAEL: 2105.83 [oz_av]

## 2016-01-21 LAB — CULTURE, BLOOD (ROUTINE X 2)
CULTURE: NO GROWTH
Culture: NO GROWTH

## 2016-01-21 MED ORDER — DRONABINOL 2.5 MG PO CAPS
2.5000 mg | ORAL_CAPSULE | Freq: Three times a day (TID) | ORAL | Status: DC
Start: 2016-01-21 — End: 2016-08-24

## 2016-01-21 MED ORDER — CEFPODOXIME PROXETIL 200 MG PO TABS: 200.0000 mg | ORAL_TABLET | ORAL | Status: DC

## 2016-01-21 NOTE — Progress Notes (Signed)
Reported called to RN Tawan at [facility].

## 2016-01-21 NOTE — Progress Notes (Signed)
Echocardiogram 2D Echocardiogram has been performed.  Dorothey BasemanReel, Michon Kaczmarek M 01/21/2016, 9:47 AM

## 2016-01-21 NOTE — Plan of Care (Signed)
Problem: Education: Goal: Knowledge of treatment and prevention of UTI/Pyleonephritis will improve Outcome: Not Met (add Reason) Pt confused.     

## 2016-01-21 NOTE — Clinical Social Work Placement (Signed)
   CLINICAL SOCIAL WORK PLACEMENT  NOTE  Date:  01/21/2016  Patient Details  Name: Taylor Padilla MRN: [id number] Date of Birth: July 20, 1946  Clinical Social Work is seeking post-discharge placement for this patient at the Skilled  Nursing Facility level of care (*CSW will initial, date and re-position this form in  chart as items are completed):  Yes   Patient/family provided with [facility] Clinical Social Work Department's list of facilities offering this level of care within the geographic area requested by the patient (or if unable, by the patient's family).  Yes   Patient/family informed of their freedom to choose among providers that offer the needed level of care, that participate in Medicare, Medicaid or managed care program needed by the patient, have an available bed and are willing to accept the patient.  Yes   Patient/family informed of [facility] ownership interest [facility], as well as of the fact that they are under no obligation to receive care at these facilities.  PASRR submitted to EDS on       PASRR number received on       Existing PASRR number confirmed on 01/21/16     FL2 transmitted to all facilities in geographic area requested by pt/family on 01/21/16     FL2 transmitted to all facilities within larger geographic area on       Patient informed that his/her managed care company has contracts with or will negotiate with certain facilities, including the following:      Yes      Patient/family informed of bed offers received. 01/21/2016   [facility]  Physician recommends and patient chooses bed at     SNF Patient to be transferred to   on  . 01/21/2016   Patient to be transferred to facility by     EMS  Patient family notified on   of transfer.  POA Victorino DikeJennifer  Name of family member notified:      Sister in Social workerlaw  PHYSICIAN Please sign FL2, Please prepare prescriptions     Additional Comment:     _______________________________________________ Raye Sorrowoble, Mahek Schlesinger N, LCSW 01/21/2016, 3:18 PM

## 2016-01-21 NOTE — Discharge Summary (Signed)
Physician Discharge Summary  Taylor [name] ZOX:[id number] DOB: 1945/09/30 DOA: 01/15/2016  PCP: No PCP Per Patient  Admit date: 01/15/2016 Discharge date: 01/21/2016  Time spent: 60 minutes  Recommendations for Outpatient Follow-up:  1. If PO intake continues to be poor despite completing treatment for UTI and despite Marinol, recommend palliative care- do not recommend return to hospital- discussed with POA who is in agreement 2. Assist with feed- frequent feeding every 4 hrs over the next few days 3. Bmet in 5 days to check for dehydration 4. DNR 5. Stop Vantin after dose on 5/21  Discharge Condition: stable    Discharge Diagnoses:  Principal Problem:   Acute kidney injury (HCC) Active Problems:   UTI (urinary tract infection)   Acute encephalopathy   Diabetes mellitus type II, uncontrolled (HCC)   CAD (coronary artery disease)   CKD (chronic kidney disease) stage 4, GFR 15-29 ml/min (HCC)   Anemia   Essential hypertension   Chronic diastolic heart failure (HCC)   Dementia  History of present illness:  Taylor [name] is a 70 y.o. Female who is legally blind and has CAD, Diastolic CHF, HTN, dementia, DM2 who was [facility] due to increased weakness and poor intake of foods and liquids. She was found to have an elevated BUN/Cr of 92/4.79 and a positive UA. Despite her Cr improving, she was quite lethargic on 5/16. Her Xanax and Seroquel were held.  Despite our attempt to correct underlying problems to see if her PO intake will improve, she is still only taking 2-3 bites of her meals.   Hospital Course:  Principal Problem:  Acute kidney injury- CKD 4 - due to volume depletion from diuretics and poor PO intake - continues to improve- renal consulted and seen by Dr Arlean HoppingSchertz who agrees with IVF and holding Lasix and Aldactone - Cr is now 2.21- down to her baseline - BUN steadily improving with IVF - BUN/Cr ratio now < 20     Active Problems:  Chronic diastolic heart failure (HCC) - follow Resp status while lasix and aldactone on hold and hydration- asked RN to check pulse ox- 98% on room air today-  - ECHO 3/16- Mod MR, grade 2 dd, severe pulm HTN, RV dilatation - last seen in HF clinic on 11/16  UTI (urinary tract infection) - UA> many bacteria and too numerous to count WBC  - Rocephin started 5/13- U culture >> no growth - changed to Vantin to complete a 7 day course  Hypokalemia - replaced  Acute encephalopathy - sleepy on 5/16 - held Xanax(0.25 BID) and Seroquel ( 25 mg BID) as of 5/16- no Xanax withdrawal noted  - confused at baseline but has been quite alert and pleasant for the passed couple of days- no sings of agitation with Seroquel on hold  Anorexia - still barely eating and drinking- not improving with treating AKI and UTI - started Marinol- if this does not help increase PO intake, may need to consider comfort care- d/w sister and her POA (sister in law) as well.  Anemia - on Iron as outpt- having black stools- Hb at baseline   Diabetes mellitus type II, uncontrolled (HCC) -  lantus held due to poor oral intake -- sugars not high enough for short acting insulin - SSI should be used at mealtime if sugars high   CAD (coronary artery disease) s/p CABG - on Plavix, statin   Essential hypertension - Clonidine Imdur Hydralazine Amlodipine  Procedures:  none (i.e. Studies not automatically included, echos, thoracentesis, etc; not x-rays)  Consultations:  none  Discharge Exam: Filed Weights   01/15/16 2351  Weight: 59.7 kg (131 lb 9.8 oz)   Filed Vitals:   01/20/16 2212 01/21/16 0954  BP: 161/86 162/53  Pulse: 69 63  Temp: 98.2 F (36.8 C) 98.4 F (36.9 C)  Resp: 20 18    General: alert, no distress Cardiovascular: RRR, no murmurs  Respiratory: clear to auscultation bilaterally GI: soft, non-tender, non-distended, bowel sound positive  Discharge  Instructions You were cared for by a hospitalist during your hospital stay. If you have any questions about your discharge medications or the care you received while you were in the hospital after you are discharged, you can call the unit and asked to speak with the hospitalist on call if the hospitalist that took care of you is not available. Once you are discharged, your primary care physician will handle any further medical issues. Please note that NO REFILLS for any discharge medications will be authorized once you are discharged, as it is imperative that you return to your primary care physician (or establish a relationship with a primary care physician if you do not have one) for your aftercare needs so that they can reassess your need for medications and monitor your lab values.      Discharge Instructions    Increase activity slowly    Complete by:  As directed             Medication List    STOP taking these medications        ALPRAZolam 0.25 MG tablet  Commonly known as:  XANAX     ciprofloxacin 500 MG tablet  Commonly known as:  CIPRO     ferrous sulfate 325 (65 FE) MG tablet     furosemide 40 MG tablet  Commonly known as:  LASIX     insulin glargine 100 UNIT/ML injection  Commonly known as:  LANTUS     oxyCODONE-acetaminophen 5-325 MG tablet  Commonly known as:  PERCOCET/ROXICET     QUEtiapine 25 MG tablet  Commonly known as:  SEROQUEL     ROBAFEN 100 MG/5ML syrup  Generic drug:  guaifenesin     spironolactone 25 MG tablet  Commonly known as:  ALDACTONE     traMADol 50 MG tablet  Commonly known as:  ULTRAM      TAKE these medications        acetaminophen 500 MG tablet  Commonly known as:  TYLENOL  Take 500 mg by mouth every 4 (four) hours as needed for mild pain, fever or headache.     amLODipine 10 MG tablet  Commonly known as:  NORVASC  Take 10 mg by mouth daily.     atorvastatin 80 MG tablet  Commonly known as:  LIPITOR  Take 80 mg by mouth at  bedtime.     cefpodoxime 200 MG tablet  Commonly known as:  VANTIN  Take 1 tablet (200 mg total) by mouth daily.     cloNIDine 0.1 MG tablet  Commonly known as:  CATAPRES  Take 0.1 mg by mouth 2 (two) times daily.     clopidogrel 75 MG tablet  Commonly known as:  PLAVIX  Take 75 mg by mouth daily.     dorzolamide-timolol 22.3-6.8 MG/ML ophthalmic solution  Commonly known as:  COSOPT  Place 1 drop into both eyes every 12 (twelve) hours.     dronabinol 2.5 MG  capsule  Commonly known as:  MARINOL  Take 1 capsule (2.5 mg total) by mouth 3 (three) times daily before meals.     HUMALOG KWIKPEN 100 UNIT/ML KiwkPen  Generic drug:  insulin lispro  Inject 0-12 Units into the skin 4 (four) times daily. Sliding scale 0-150= 0 units 151-200= 2 units 201-250= 4 units 251-300= 6 units 301-350= 8 units 351-400= 10 units 401-450= 12 units 451< contact MD     hydrALAZINE 100 MG tablet  Commonly known as:  APRESOLINE  Take 1 tablet (100 mg total) by mouth every 8 (eight) hours.     isosorbide dinitrate 20 MG tablet  Commonly known as:  ISORDIL  Take 20 mg by mouth 3 (three) times daily.     latanoprost 0.005 % ophthalmic solution  Commonly known as:  XALATAN  Place 1 drop into the right eye at bedtime.     levothyroxine 100 MCG tablet  Commonly known as:  SYNTHROID, LEVOTHROID  Take 1 tablet (100 mcg total) by mouth daily before breakfast.     loperamide 2 MG capsule  Commonly known as:  IMODIUM  Take 2 mg by mouth as needed for diarrhea or loose stools.     magnesium hydroxide 400 MG/5ML suspension  Commonly known as:  MILK OF MAGNESIA  Take 30 mLs by mouth at bedtime as needed for mild constipation.     Melatonin 10 MG Caps  Take 10 mg by mouth every evening.     MINTOX 200-200-20 MG/5ML suspension  Generic drug:  alum & mag hydroxide-simeth  Take 30 mLs by mouth as needed for indigestion or heartburn.     neomycin-bacitracin-polymyxin ointment  Commonly known as:  NEOSPORIN   Apply 1 application topically as directed. Apply to skin tear as needed until healed     NUTRITIONAL DRINK PO  Take 1 each by mouth 3 (three) times daily. Mighty shakes     pantoprazole 20 MG tablet  Commonly known as:  PROTONIX  Take 20 mg by mouth daily.     PARoxetine 20 MG tablet  Commonly known as:  PAXIL  Take 20 mg by mouth daily.     polyvinyl alcohol 1.4 % ophthalmic solution  Commonly known as:  LIQUIFILM TEARS  Place 1 drop into the left eye 4 (four) times daily.     prednisoLONE acetate 1 % ophthalmic suspension  Commonly known as:  PRED FORTE  Place 1 drop into the left eye 4 (four) times daily.     Vitamin D (Ergocalciferol) 50000 units Caps capsule  Commonly known as:  DRISDOL  Take 50,000 Units by mouth See admin instructions. On the 27th day of each month       Allergies  Allergen Reactions  . Epinephrine Other (See Comments)    Patient says she is not allergic to epi, but that it makes her jittery.  WILL RECEIVE IF NEEDED IN AN EMERGENCY.      The results of significant diagnostics from this hospitalization (including imaging, microbiology, ancillary and laboratory) are listed below for reference.    Significant Diagnostic Studies: Dg Cervical Spine Complete  12/28/2015  CLINICAL DATA:  Neck pain without known injury. EXAM: CERVICAL SPINE - COMPLETE 4+ VIEW COMPARISON:  CT scan of October 31, 2014. FINDINGS: Severe degenerative disc disease is noted at C5-6. Mild grade 1 retrolisthesis of C5-6 is noted with anterior and posterior osteophyte formation. Degenerative changes seen involving multiple left-sided posterior facet joints. No significant neural foraminal stenosis is noted. IMPRESSION:  Severe degenerative disc disease is noted at C5-6 with mild grade 1 retrolisthesis. No acute abnormality seen in the cervical spine. Electronically Signed   By: Lupita Raider, M.D.   On: 12/28/2015 13:09   Ct Head Wo Contrast  01/18/2016  CLINICAL DATA:  Patient  was brought in from assisted living center due to increased weakness and decreased food intake for 4 days. EXAM: CT HEAD WITHOUT CONTRAST TECHNIQUE: Contiguous axial images were obtained from the base of the skull through the vertex without intravenous contrast. COMPARISON:  October 31, 2014 FINDINGS: There is chronic diffuse atrophy. Chronic bilateral periventricular white matter small vessel ischemic change is identified. There is no midline shift, hydrocephalus, or mass. No acute hemorrhage or acute transcortical infarct is identified. The bony calvarium is intact. There is mucoperiosteal thickening of bilateral ethmoid sinus. IMPRESSION: No focal acute intracranial abnormality identified. Chronic diffuse atrophy. Chronic bilateral periventricular white matter small vessel ischemic change. Electronically Signed   By: Sherian Rein M.D.   On: 01/18/2016 15:07   US Renal  01/17/2016  CLINICAL DATA:  Renal failure, hypertension, coronary artery disease post MI, hypercholesterolemia, stage III chronic kidney disease, CHF, type II diabetes mellitus EXAM: RENAL / URINARY TRACT ULTRASOUND COMPLETE COMPARISON:  11/25/2014 FINDINGS: Right Kidney: Length: 9.5 cm. Cortical thinning. Upper cortical echogenicity. Small exophytic nodule at upper to mid kidney likely small cyst 2.3 x 1.7 x 2.2 cm, noted previously. No solid mass or hydronephrosis. Left Kidney: Length: 10.5 cm. Cortical thinning. Increased cortical echogenicity. Questionable small hypoechoic nodule at lower pole versus artifact, poorly visualized, 1.4 x 1.2 x 1.6 cm, complex cyst not excluded. A cyst was identified at the inferior pole LEFT kidney on the previous exam, slightly larger than the current finding. No additional or enlarging mass, or evidence of hydronephrosis. Bladder: Normal appearance IMPRESSION: BILATERAL renal cortical atrophy. Small RIGHT renal cysts, with complicated cystic lesion at inferior pole LEFT kidney less well visualized than on  previous study. Electronically Signed   By: Ulyses Southward M.D.   On: 01/17/2016 23:35   Dg Chest Port 1 View  01/15/2016  CLINICAL DATA:  Vomiting EXAM: PORTABLE CHEST 1 VIEW COMPARISON:  09/22/2015 FINDINGS: Unchanged borderline cardiomegaly. Prior sternotomy and CABG. Mild chronic appearing interstitial coarsening. No consolidation. No large effusion. No pneumothorax. Unremarkable hilar and mediastinal contours. IMPRESSION: No acute cardiopulmonary findings. Unchanged borderline cardiomegaly. Electronically Signed   By: Ellery Plunk M.D.   On: 01/15/2016 23:17    Microbiology: Recent Results (from the past 240 hour(s))  Urine culture     Status: None   Collection Time: 01/15/16  9:30 PM  Result Value Ref Range Status   Specimen Description URINE, CATHETERIZED  Final   Special Requests NONE  Final   Culture NO GROWTH Performed at [facility]   Final   Report Status 01/17/2016 FINAL  Final  Blood culture (routine x 2)     Status: None (Preliminary result)   Collection Time: 01/15/16 10:25 PM  Result Value Ref Range Status   Specimen Description BLOOD LEFT FOREARM  Final   Special Requests BOTTLES DRAWN AEROBIC AND ANAEROBIC 5CC  Final   Culture   Final    NO GROWTH 4 DAYS Performed at [facility]    Report Status PENDING  Incomplete  Blood culture (routine x 2)     Status: None (Preliminary result)   Collection Time: 01/15/16 10:30 PM  Result Value Ref Range Status   Specimen Description BLOOD RIGHT HAND  Final   Special Requests BOTTLES DRAWN AEROBIC AND ANAEROBIC 5CC  Final   Culture   Final    NO GROWTH 4 DAYS Performed at [facility]    Report Status PENDING  Incomplete  MRSA PCR Screening     Status: Abnormal   Collection Time: 01/16/16 12:26 AM  Result Value Ref Range Status   MRSA by PCR POSITIVE (A) NEGATIVE Final    Comment:        The GeneXpert MRSA Assay (FDA approved for NASAL specimens only), is one component of a comprehensive MRSA  colonization surveillance program. It is not intended to diagnose MRSA infection nor to guide or monitor treatment for MRSA infections. RESULT CALLED TO, READ BACK BY AND VERIFIED WITH: A.LEMONS RN 0252 A.QUIZON [id number]      Labs: Basic Metabolic Panel:  Recent Labs Lab 01/17/16 0748 01/18/16 0524 01/19/16 0813 01/20/16 1315 01/21/16 0854  NA 133* 137 134* 133* 133*  K 3.6 3.2* 3.5 3.1* 4.6  CL 100* 105 102 106 107  CO2 22 23 20* 19* 20*  GLUCOSE 135* 106* 150* 156* 163*  BUN 68* 57* 44* 35* 31*  CREATININE 3.65* 3.22* 2.80* 2.28* 2.21*  CALCIUM 8.2* 8.5* 8.4* 7.9* 8.4*   Liver Function Tests: No results for input(s): AST, ALT, ALKPHOS, BILITOT, PROT, ALBUMIN in the last 168 hours. No results for input(s): LIPASE, AMYLASE in the last 168 hours. No results for input(s): AMMONIA in the last 168 hours. CBC:  Recent Labs Lab 01/15/16 2033  01/17/16 0748 01/18/16 0524 01/19/16 0813 01/20/16 1315 01/21/16 0854  WBC 21.3*  < > 13.8* 12.3* 15.5* 12.5* 11.4*  NEUTROABS 19.0*  --   --   --   --   --   --   HGB 9.9*  < > 8.9* 9.0* 9.4* 8.5* 9.0*  HCT 30.3*  < > 27.6* 28.0* 28.2* 25.7* 27.5*  MCV 90.7  < > 90.5 92.1 87.6 89.5 89.9  PLT 439*  < > 355 349 PLATELET CLUMPS NOTED ON SMEAR, COUNT APPEARS ADEQUATE 389 389  < > = values in this interval not displayed. Cardiac Enzymes: No results for input(s): CKTOTAL, CKMB, CKMBINDEX, TROPONINI in the last 168 hours. BNP: BNP (last 3 results)  Recent Labs  07/14/15 1308 01/15/16 2033  BNP 1431.8* 169.8*    ProBNP (last 3 results) No results for input(s): PROBNP in the last 8760 hours.  CBG:  Recent Labs Lab 01/20/16 2014 01/20/16 2358 01/21/16 0400 01/21/16 0724 01/21/16 1115  GLUCAP 110* 121* 130* 147* 155*       SignedCalvert Cantor, MD [facility] 01/21/2016, 11:28 AM

## 2016-01-21 NOTE — Progress Notes (Addendum)
Update LCSW planning to send patient back to ALF, but ALF unwilling to accept patient back due to: Mobility issues Isolation Precautions recommend palliative care- do not recommend return to hospital- discussed with POA who is in agreement.  Barrier to DC:   Faciliy feels they cannot manage patient at this level.  Call placed to POA with regards to SNF placement.  Message left for POA to follow up with plan.  CSW following.   Resolved Barriers:  See below!   1.Spoke with sister in law and she is in agreement with SNF placement 2. Updated FL2 for SNF 3. Obtained SNF [facility] 4. Faxed to facilities for review and bed offers.  Family first choice: [facility], [facility] and [facility]. Contact made with [facility], awaiting review of clinicals.  5. Bed offers given and family accepted bed at [facility].  Transport by EMS and packet sent with patient. No other needs. DC to SNF.   LCSWA notified facility of patient DC. Waiting for return call.  Sent clinicals (FL2, DC Summary, Therapy Notes) via HUB. Patient is Full code Notified Family of DC and confirmed plan for transport-[facility]. Filled out Medical Necessity form and placed in chart on unit.   Vivi BarrackNicole Sinclair, Theresia MajorsLCSWA, MSW Clinical Social Worker 5E and Psychiatric Service Line [phone] 01/21/2016  12:13 PM

## 2016-01-21 NOTE — NC FL2 (Signed)
[place] MEDICAID FL2 LEVEL OF CARE SCREENING TOOL     IDENTIFICATION  Patient Name: Taylor Padilla Birthdate: 12/12/1945 Sex: female Admission Date (Current Location): 01/15/2016  [facility] and [place] Number:  Producer, television/film/video and Address:  [facility],  [address] [place] [address]      Provider Number: [id number]  Attending Physician Name and Address:  Calvert Cantor, MD  Relative Name and Phone Number:       Current Level of Care: Hospital Recommended Level of Care: Skilled Nursing Facility Prior Approval Number:    Date Approved/Denied:   PASRR Number:   [id number]   Discharge Plan: SNF    Current Diagnoses: Patient Active Problem List   Diagnosis Date Noted  . Acute encephalopathy 01/18/2016  . Acute kidney injury (HCC) 01/15/2016  . Anemia due to other cause 03/07/2015  . UTI (urinary tract infection) 03/04/2015  . Right acetabular fracture (HCC) 03/02/2015  . Chronic diastolic heart failure (HCC) 12/31/2014  . AKI (acute kidney injury) (HCC)   . Staphylococcus aureus bacteremia 11/23/2014  . Fever   . Acute diastolic heart failure, NYHA class 1 (HCC) 11/19/2014  . Acute on chronic diastolic heart failure (HCC)   . Coronary artery disease involving native coronary artery of native heart without angina pectoris   . Essential hypertension   . CKD (chronic kidney disease) stage 4, GFR 15-29 ml/min (HCC) 10/07/2014  . Hypothyroidism 10/07/2014  . Dyslipidemia 10/07/2014  . GERD (gastroesophageal reflux disease) 10/07/2014  . Acute respiratory failure with hypoxia (HCC) 10/07/2014  . Sinus bradycardia 10/07/2014  . Anemia 10/07/2014  . Diabetes mellitus type II, uncontrolled (HCC)   . Hypertension   . CAD (coronary artery disease)     Orientation RESPIRATION BLADDER Height & Weight     Self  Normal Incontinent Weight: 131 lb 9.8 oz (59.7 kg) Height:   (154.9 cm)  BEHAVIORAL SYMPTOMS/MOOD NEUROLOGICAL BOWEL NUTRITION STATUS   Other (Comment) ( none)   Incontinent Diet (heart healthy)  AMBULATORY STATUS COMMUNICATION OF NEEDS Skin   Extensive Assist Verbally Normal                       Personal Care Assistance Level of Assistance  Bathing, Feeding, Dressing Bathing Assistance: Limited assistance Feeding assistance: Limited assistance Dressing Assistance: Limited assistance     Functional Limitations Info  Sight, Hearing, Speech Sight Info: Impaired Hearing Info: Adequate Speech Info: Adequate    SPECIAL CARE FACTORS FREQUENCY  PT (By licensed PT), OT (By licensed OT)     PT Frequency: 5 OT Frequency: 5            Contractures Contractures Info: Not present    Additional Factors Info  Code Status, Allergies Code Status Info: Full Allergies Info: Epinephrine     Isolation Precautions Info: Yes, on contact for MRSA     Current Medications (01/21/2016):  This is the current hospital active medication list Current Facility-Administered Medications  Medication Dose Route Frequency Provider Last Rate Last Dose  . acetaminophen (TYLENOL) tablet 650 mg  650 mg Oral Q6H PRN Ron Parker, MD   650 mg at 01/16/16 2119   Or  . acetaminophen (TYLENOL) suppository 650 mg  650 mg Rectal Q6H PRN Ron Parker, MD      . atorvastatin (LIPITOR) tablet 80 mg  80 mg Oral QHS Ron Parker, MD   80 mg at 01/20/16 2203  . cefpodoxime (VANTIN) tablet 200 mg  200 mg Oral Q24H Calvert CantorSaima Rizwan, MD   200 mg at 01/20/16 1514  . cloNIDine (CATAPRES) tablet 0.1 mg  0.1 mg Oral BID Ron ParkerHarvette C Jenkins, MD   0.1 mg at 01/21/16 1046  . clopidogrel (PLAVIX) tablet 75 mg  75 mg Oral Daily Ron ParkerHarvette C Jenkins, MD   75 mg at 01/21/16 1046  . dorzolamide-timolol (COSOPT) 22.3-6.8 MG/ML ophthalmic solution 1 drop  1 drop Both Eyes Q12H Ron ParkerHarvette C Jenkins, MD   1 drop at 01/21/16 1046  . dronabinol (MARINOL) capsule 2.5 mg  2.5 mg Oral BID AC Calvert CantorSaima Rizwan, MD   2.5 mg at 01/21/16 1253  . ferrous sulfate tablet  325 mg  325 mg Oral BID WC Ron ParkerHarvette C Jenkins, MD   325 mg at 01/21/16 0852  . heparin injection 5,000 Units  5,000 Units Subcutaneous Q8H Ron ParkerHarvette C Jenkins, MD   5,000 Units at 01/21/16 [phone]  . insulin aspart (novoLOG) injection 0-5 Units  0-5 Units Subcutaneous QHS Ron ParkerHarvette C Jenkins, MD   4 Units at 01/16/16 0119  . insulin aspart (novoLOG) injection 0-9 Units  0-9 Units Subcutaneous TID WC Ron ParkerHarvette C Jenkins, MD   2 Units at 01/21/16 1253  . isosorbide dinitrate (ISORDIL) tablet 20 mg  20 mg Oral TID Belkys A Regalado, MD   20 mg at 01/21/16 1046  . levothyroxine (SYNTHROID, LEVOTHROID) tablet 100 mcg  100 mcg Oral QAC breakfast Jamse MeadJigna M Gadhia, RPH   100 mcg at 01/21/16 [id number]  . ondansetron (ZOFRAN) tablet 4 mg  4 mg Oral Q6H PRN Ron ParkerHarvette C Jenkins, MD       Or  . ondansetron (ZOFRAN) injection 4 mg  4 mg Intravenous Q6H PRN Ron ParkerHarvette C Jenkins, MD      . pantoprazole (PROTONIX) EC tablet 20 mg  20 mg Oral Daily Ron ParkerHarvette C Jenkins, MD   20 mg at 01/21/16 1046  . polyvinyl alcohol (LIQUIFILM TEARS) 1.4 % ophthalmic solution 1 drop  1 drop Left Eye QID Ron ParkerHarvette C Jenkins, MD   1 drop at 01/21/16 1046  . potassium chloride SA (K-DUR,KLOR-CON) CR tablet 20 mEq  20 mEq Oral Once Leda GauzeKaren J Kirby-Graham, NP   20 mEq at 01/18/16 1000  . prednisoLONE acetate (PRED FORTE) 1 % ophthalmic suspension 1 drop  1 drop Left Eye QID Ron ParkerHarvette C Jenkins, MD   1 drop at 01/21/16 1046     Discharge Medications: Please see discharge summary for a list of discharge medications.  Relevant Imaging Results:  Relevant Lab Results:   Additional Information SS #  [id number]  Raye SorrowCoble, Kamy Poinsett N, LCSW

## 2016-01-21 NOTE — Progress Notes (Signed)
Pt d/c. VSS. IV d/c with cath intact.  Pt transported to facility via [facility].

## 2016-08-18 ENCOUNTER — Observation Stay (HOSPITAL_BASED_OUTPATIENT_CLINIC_OR_DEPARTMENT_OTHER): Payer: Medicare Other

## 2016-08-18 ENCOUNTER — Encounter (HOSPITAL_COMMUNITY): Payer: Self-pay

## 2016-08-18 ENCOUNTER — Emergency Department (HOSPITAL_COMMUNITY): Payer: Medicare Other

## 2016-08-18 ENCOUNTER — Inpatient Hospital Stay (HOSPITAL_COMMUNITY)
Admission: EM | Admit: 2016-08-18 | Discharge: 2016-08-24 | DRG: 291 | Disposition: A | Discharge status: Skilled Nursing Facility | Payer: Medicare Other | Attending: Internal Medicine | Admitting: Internal Medicine

## 2016-08-18 DIAGNOSIS — I5033 Acute on chronic diastolic (congestive) heart failure: Secondary | ICD-10-CM | POA: Diagnosis not present

## 2016-08-18 DIAGNOSIS — K219 Gastro-esophageal reflux disease without esophagitis: Secondary | ICD-10-CM | POA: Diagnosis present

## 2016-08-18 DIAGNOSIS — M7120 Synovial cyst of popliteal space [Baker], unspecified knee: Secondary | ICD-10-CM | POA: Diagnosis present

## 2016-08-18 DIAGNOSIS — R0902 Hypoxemia: Secondary | ICD-10-CM

## 2016-08-18 DIAGNOSIS — I2510 Atherosclerotic heart disease of native coronary artery without angina pectoris: Secondary | ICD-10-CM | POA: Diagnosis present

## 2016-08-18 DIAGNOSIS — D638 Anemia in other chronic diseases classified elsewhere: Secondary | ICD-10-CM | POA: Diagnosis present

## 2016-08-18 DIAGNOSIS — E785 Hyperlipidemia, unspecified: Secondary | ICD-10-CM | POA: Diagnosis present

## 2016-08-18 DIAGNOSIS — I132 Hypertensive heart and chronic kidney disease with heart failure and with stage 5 chronic kidney disease, or end stage renal disease: Principal | ICD-10-CM | POA: Diagnosis present

## 2016-08-18 DIAGNOSIS — F0390 Unspecified dementia without behavioral disturbance: Secondary | ICD-10-CM | POA: Diagnosis present

## 2016-08-18 DIAGNOSIS — Z888 Allergy status to other drugs, medicaments and biological substances status: Secondary | ICD-10-CM

## 2016-08-18 DIAGNOSIS — R188 Other ascites: Secondary | ICD-10-CM | POA: Diagnosis present

## 2016-08-18 DIAGNOSIS — E1121 Type 2 diabetes mellitus with diabetic nephropathy: Secondary | ICD-10-CM | POA: Diagnosis present

## 2016-08-18 DIAGNOSIS — I5031 Acute diastolic (congestive) heart failure: Secondary | ICD-10-CM | POA: Diagnosis present

## 2016-08-18 DIAGNOSIS — I509 Heart failure, unspecified: Secondary | ICD-10-CM | POA: Diagnosis not present

## 2016-08-18 DIAGNOSIS — E1165 Type 2 diabetes mellitus with hyperglycemia: Secondary | ICD-10-CM | POA: Diagnosis present

## 2016-08-18 DIAGNOSIS — I2721 Secondary pulmonary arterial hypertension: Secondary | ICD-10-CM | POA: Diagnosis present

## 2016-08-18 DIAGNOSIS — I252 Old myocardial infarction: Secondary | ICD-10-CM

## 2016-08-18 DIAGNOSIS — R001 Bradycardia, unspecified: Secondary | ICD-10-CM | POA: Diagnosis present

## 2016-08-18 DIAGNOSIS — N184 Chronic kidney disease, stage 4 (severe): Secondary | ICD-10-CM | POA: Diagnosis not present

## 2016-08-18 DIAGNOSIS — J9600 Acute respiratory failure, unspecified whether with hypoxia or hypercapnia: Secondary | ICD-10-CM

## 2016-08-18 DIAGNOSIS — H548 Legal blindness, as defined in USA: Secondary | ICD-10-CM | POA: Diagnosis present

## 2016-08-18 DIAGNOSIS — F329 Major depressive disorder, single episode, unspecified: Secondary | ICD-10-CM | POA: Diagnosis present

## 2016-08-18 DIAGNOSIS — E860 Dehydration: Secondary | ICD-10-CM | POA: Diagnosis present

## 2016-08-18 DIAGNOSIS — J9811 Atelectasis: Secondary | ICD-10-CM | POA: Diagnosis present

## 2016-08-18 DIAGNOSIS — R54 Age-related physical debility: Secondary | ICD-10-CM | POA: Diagnosis present

## 2016-08-18 DIAGNOSIS — Z8673 Personal history of transient ischemic attack (TIA), and cerebral infarction without residual deficits: Secondary | ICD-10-CM

## 2016-08-18 DIAGNOSIS — Z951 Presence of aortocoronary bypass graft: Secondary | ICD-10-CM

## 2016-08-18 DIAGNOSIS — Z79899 Other long term (current) drug therapy: Secondary | ICD-10-CM

## 2016-08-18 DIAGNOSIS — E039 Hypothyroidism, unspecified: Secondary | ICD-10-CM | POA: Diagnosis present

## 2016-08-18 DIAGNOSIS — H409 Unspecified glaucoma: Secondary | ICD-10-CM | POA: Diagnosis present

## 2016-08-18 DIAGNOSIS — Z7902 Long term (current) use of antithrombotics/antiplatelets: Secondary | ICD-10-CM

## 2016-08-18 DIAGNOSIS — Z66 Do not resuscitate: Secondary | ICD-10-CM | POA: Diagnosis present

## 2016-08-18 DIAGNOSIS — E1122 Type 2 diabetes mellitus with diabetic chronic kidney disease: Secondary | ICD-10-CM | POA: Diagnosis present

## 2016-08-18 DIAGNOSIS — R0602 Shortness of breath: Secondary | ICD-10-CM

## 2016-08-18 DIAGNOSIS — E118 Type 2 diabetes mellitus with unspecified complications: Secondary | ICD-10-CM | POA: Diagnosis not present

## 2016-08-18 DIAGNOSIS — Z794 Long term (current) use of insulin: Secondary | ICD-10-CM | POA: Diagnosis present

## 2016-08-18 DIAGNOSIS — J9601 Acute respiratory failure with hypoxia: Secondary | ICD-10-CM | POA: Diagnosis present

## 2016-08-18 DIAGNOSIS — N185 Chronic kidney disease, stage 5: Secondary | ICD-10-CM | POA: Diagnosis present

## 2016-08-18 DIAGNOSIS — N179 Acute kidney failure, unspecified: Secondary | ICD-10-CM | POA: Diagnosis present

## 2016-08-18 HISTORY — DX: Endocarditis, valve unspecified: I38

## 2016-08-18 HISTORY — DX: Anemia, unspecified: D64.9

## 2016-08-18 HISTORY — DX: Secondary pulmonary arterial hypertension: I27.21

## 2016-08-18 HISTORY — DX: Hypertensive heart disease without heart failure: I11.9

## 2016-08-18 HISTORY — DX: Chronic kidney disease, stage 4 (severe): N18.4

## 2016-08-18 HISTORY — DX: Chronic diastolic (congestive) heart failure: I50.32

## 2016-08-18 LAB — CBC WITH DIFFERENTIAL/PLATELET
Basophils Absolute: 0 10*3/uL (ref 0.0–0.1)
Basophils Relative: 0 %
EOS ABS: 0.3 10*3/uL (ref 0.0–0.7)
Eosinophils Relative: 3 %
HEMATOCRIT: 27 % — AB (ref 36.0–46.0)
HEMOGLOBIN: 8.2 g/dL — AB (ref 12.0–15.0)
LYMPHS ABS: 0.9 10*3/uL (ref 0.7–4.0)
Lymphocytes Relative: 8 %
MCH: 28.2 pg (ref 26.0–34.0)
MCHC: 30.4 g/dL (ref 30.0–36.0)
MCV: 92.8 fL (ref 78.0–100.0)
MONO ABS: 0.4 10*3/uL (ref 0.1–1.0)
MONOS PCT: 4 %
NEUTROS ABS: 9.4 10*3/uL — AB (ref 1.7–7.7)
NEUTROS PCT: 85 %
Platelets: 401 10*3/uL — ABNORMAL HIGH (ref 150–400)
RBC: 2.91 MIL/uL — ABNORMAL LOW (ref 3.87–5.11)
RDW: 15.2 % (ref 11.5–15.5)
WBC: 11 10*3/uL — ABNORMAL HIGH (ref 4.0–10.5)

## 2016-08-18 LAB — COMPREHENSIVE METABOLIC PANEL
ALBUMIN: 3.4 g/dL — AB (ref 3.5–5.0)
ALT: 9 U/L — AB (ref 14–54)
AST: 18 U/L (ref 15–41)
Alkaline Phosphatase: 81 U/L (ref 38–126)
Anion gap: 14 (ref 5–15)
BUN: 40 mg/dL — ABNORMAL HIGH (ref 6–20)
CHLORIDE: 106 mmol/L (ref 101–111)
CO2: 19 mmol/L — AB (ref 22–32)
CREATININE: 3.37 mg/dL — AB (ref 0.44–1.00)
Calcium: 9.1 mg/dL (ref 8.9–10.3)
GFR calc non Af Amer: 13 mL/min — ABNORMAL LOW (ref >60)
GFR, EST AFRICAN AMERICAN: 15 mL/min — AB (ref >60)
GLUCOSE: 255 mg/dL — AB (ref 65–99)
Potassium: 3.5 mmol/L (ref 3.5–5.1)
SODIUM: 139 mmol/L (ref 135–145)
Total Bilirubin: 0.8 mg/dL (ref 0.3–1.2)
Total Protein: 7.1 g/dL (ref 6.5–8.1)

## 2016-08-18 LAB — TROPONIN I
TROPONIN I: 0.04 ng/mL — AB (ref <0.03)
TROPONIN I: 0.05 ng/mL — AB (ref <0.03)

## 2016-08-18 LAB — LACTIC ACID, PLASMA
Lactic Acid, Venous: 1.3 mmol/L (ref 0.5–1.9)
Lactic Acid, Venous: 1.3 mmol/L (ref 0.5–1.9)

## 2016-08-18 LAB — INFLUENZA PANEL BY PCR (TYPE A & B)
INFLAPCR: NEGATIVE
Influenza B By PCR: NEGATIVE

## 2016-08-18 LAB — I-STAT CHEM 8, ED
BUN: 38 mg/dL — ABNORMAL HIGH (ref 6–20)
CHLORIDE: 106 mmol/L (ref 101–111)
CREATININE: 3.6 mg/dL — AB (ref 0.44–1.00)
Calcium, Ion: 1.1 mmol/L — ABNORMAL LOW (ref 1.15–1.40)
GLUCOSE: 253 mg/dL — AB (ref 65–99)
HCT: 25 % — ABNORMAL LOW (ref 36.0–46.0)
Hemoglobin: 8.5 g/dL — ABNORMAL LOW (ref 12.0–15.0)
POTASSIUM: 3.5 mmol/L (ref 3.5–5.1)
Sodium: 139 mmol/L (ref 135–145)
TCO2: 21 mmol/L (ref 0–100)

## 2016-08-18 LAB — ECHOCARDIOGRAM COMPLETE: Weight: 2240 oz

## 2016-08-18 LAB — D-DIMER, QUANTITATIVE (NOT AT ARMC): D DIMER QUANT: 1.64 ug{FEU}/mL — AB (ref 0.00–0.50)

## 2016-08-18 LAB — BRAIN NATRIURETIC PEPTIDE: B Natriuretic Peptide: 1537.5 pg/mL — ABNORMAL HIGH (ref 0.0–100.0)

## 2016-08-18 LAB — I-STAT TROPONIN, ED: Troponin i, poc: 0.02 ng/mL (ref 0.00–0.08)

## 2016-08-18 LAB — GLUCOSE, CAPILLARY
GLUCOSE-CAPILLARY: 157 mg/dL — AB (ref 65–99)
Glucose-Capillary: 178 mg/dL — ABNORMAL HIGH (ref 65–99)

## 2016-08-18 LAB — MRSA PCR SCREENING: MRSA by PCR: POSITIVE — AB

## 2016-08-18 LAB — TSH: TSH: 1.212 u[IU]/mL (ref 0.350–4.500)

## 2016-08-18 MED ORDER — HEPARIN SODIUM (PORCINE) 5000 UNIT/ML IJ SOLN
5000.0000 [IU] | Freq: Three times a day (TID) | INTRAMUSCULAR | Status: DC
  Administered 2016-08-18 – 2016-08-19 (×3): 5000 [IU] via SUBCUTANEOUS
  Filled 2016-08-18 (×3): qty 1

## 2016-08-18 MED ORDER — AMLODIPINE BESYLATE 10 MG PO TABS
10.0000 mg | ORAL_TABLET | Freq: Every day | ORAL | Status: DC
  Administered 2016-08-18 – 2016-08-24 (×7): 10 mg via ORAL
  Filled 2016-08-18 (×7): qty 1

## 2016-08-18 MED ORDER — FUROSEMIDE 10 MG/ML IJ SOLN
20.0000 mg | Freq: Once | INTRAMUSCULAR | Status: AC
  Administered 2016-08-18: 20 mg via INTRAVENOUS
  Filled 2016-08-18: qty 2

## 2016-08-18 MED ORDER — MELATONIN 3 MG PO TABS
9.0000 mg | ORAL_TABLET | Freq: Every evening | ORAL | Status: DC
  Administered 2016-08-18 – 2016-08-23 (×6): 9 mg via ORAL
  Filled 2016-08-18 (×7): qty 3

## 2016-08-18 MED ORDER — PAROXETINE HCL 20 MG PO TABS
20.0000 mg | ORAL_TABLET | Freq: Every day | ORAL | Status: DC
  Administered 2016-08-18 – 2016-08-19 (×2): 20 mg via ORAL
  Filled 2016-08-18 (×2): qty 1

## 2016-08-18 MED ORDER — CLOPIDOGREL BISULFATE 75 MG PO TABS
75.0000 mg | ORAL_TABLET | Freq: Every day | ORAL | Status: DC
  Administered 2016-08-18 – 2016-08-24 (×7): 75 mg via ORAL
  Filled 2016-08-18 (×7): qty 1

## 2016-08-18 MED ORDER — HYDRALAZINE HCL 50 MG PO TABS
100.0000 mg | ORAL_TABLET | Freq: Three times a day (TID) | ORAL | Status: DC
  Administered 2016-08-18 – 2016-08-24 (×17): 100 mg via ORAL
  Filled 2016-08-18 (×18): qty 2

## 2016-08-18 MED ORDER — NUTRITIONAL DRINK PO LIQD: Freq: Three times a day (TID) | ORAL | Status: DC

## 2016-08-18 MED ORDER — IPRATROPIUM BROMIDE HFA 17 MCG/ACT IN AERS: 2.0000 | INHALATION_SPRAY | RESPIRATORY_TRACT | Status: DC | PRN

## 2016-08-18 MED ORDER — ATORVASTATIN CALCIUM 80 MG PO TABS
80.0000 mg | ORAL_TABLET | Freq: Every day | ORAL | Status: DC
  Administered 2016-08-18 – 2016-08-23 (×6): 80 mg via ORAL
  Filled 2016-08-18: qty 2
  Filled 2016-08-18 (×5): qty 1

## 2016-08-18 MED ORDER — PANTOPRAZOLE SODIUM 20 MG PO TBEC
20.0000 mg | DELAYED_RELEASE_TABLET | Freq: Every day | ORAL | Status: DC
  Administered 2016-08-19 – 2016-08-24 (×6): 20 mg via ORAL
  Filled 2016-08-18 (×5): qty 1

## 2016-08-18 MED ORDER — CLONIDINE HCL 0.1 MG PO TABS
0.1000 mg | ORAL_TABLET | Freq: Two times a day (BID) | ORAL | Status: DC
  Administered 2016-08-18 – 2016-08-21 (×8): 0.1 mg via ORAL
  Filled 2016-08-18 (×9): qty 1

## 2016-08-18 MED ORDER — PREDNISOLONE ACETATE 1 % OP SUSP
1.0000 [drp] | Freq: Four times a day (QID) | OPHTHALMIC | Status: DC
  Administered 2016-08-18 – 2016-08-24 (×21): 1 [drp] via OPHTHALMIC
  Filled 2016-08-18: qty 1

## 2016-08-18 MED ORDER — DORZOLAMIDE HCL 2 % OP SOLN
1.0000 [drp] | Freq: Two times a day (BID) | OPHTHALMIC | Status: DC
  Administered 2016-08-18 – 2016-08-24 (×12): 1 [drp] via OPHTHALMIC
  Filled 2016-08-18: qty 10

## 2016-08-18 MED ORDER — TIMOLOL MALEATE 0.5 % OP SOLN
1.0000 [drp] | Freq: Two times a day (BID) | OPHTHALMIC | Status: DC
  Administered 2016-08-18 – 2016-08-24 (×12): 1 [drp] via OPHTHALMIC
  Filled 2016-08-18: qty 5

## 2016-08-18 MED ORDER — ONDANSETRON HCL 4 MG/2ML IJ SOLN
4.0000 mg | Freq: Four times a day (QID) | INTRAMUSCULAR | Status: DC | PRN
  Administered 2016-08-22: 4 mg via INTRAVENOUS
  Filled 2016-08-18: qty 2

## 2016-08-18 MED ORDER — ACETAMINOPHEN 325 MG PO TABS
650.0000 mg | ORAL_TABLET | ORAL | Status: DC | PRN
  Administered 2016-08-22 – 2016-08-23 (×2): 650 mg via ORAL
  Filled 2016-08-18 (×2): qty 2

## 2016-08-18 MED ORDER — DRONABINOL 2.5 MG PO CAPS
2.5000 mg | ORAL_CAPSULE | Freq: Three times a day (TID) | ORAL | Status: DC
  Administered 2016-08-18 – 2016-08-19 (×2): 2.5 mg via ORAL
  Filled 2016-08-18 (×2): qty 1

## 2016-08-18 MED ORDER — ISOSORBIDE DINITRATE 20 MG PO TABS
20.0000 mg | ORAL_TABLET | Freq: Three times a day (TID) | ORAL | Status: DC
  Administered 2016-08-18 – 2016-08-24 (×16): 20 mg via ORAL
  Filled 2016-08-18: qty 2
  Filled 2016-08-18: qty 1
  Filled 2016-08-18: qty 2
  Filled 2016-08-18 (×2): qty 1
  Filled 2016-08-18: qty 2
  Filled 2016-08-18: qty 1
  Filled 2016-08-18: qty 2
  Filled 2016-08-18: qty 1
  Filled 2016-08-18 (×3): qty 2
  Filled 2016-08-18 (×4): qty 1
  Filled 2016-08-18: qty 2
  Filled 2016-08-18: qty 1
  Filled 2016-08-18: qty 2
  Filled 2016-08-18 (×2): qty 1
  Filled 2016-08-18 (×4): qty 2
  Filled 2016-08-18 (×4): qty 1
  Filled 2016-08-18: qty 2
  Filled 2016-08-18: qty 1
  Filled 2016-08-18 (×2): qty 2
  Filled 2016-08-18: qty 1
  Filled 2016-08-18: qty 2

## 2016-08-18 MED ORDER — LEVOFLOXACIN 250 MG PO TABS
250.0000 mg | ORAL_TABLET | Freq: Every day | ORAL | Status: DC
  Administered 2016-08-19 – 2016-08-22 (×4): 250 mg via ORAL
  Filled 2016-08-18 (×5): qty 1

## 2016-08-18 MED ORDER — INSULIN ASPART 100 UNIT/ML ~~LOC~~ SOLN
0.0000 [IU] | Freq: Three times a day (TID) | SUBCUTANEOUS | Status: DC
  Administered 2016-08-18: 2 [IU] via SUBCUTANEOUS
  Administered 2016-08-19 – 2016-08-24 (×8): 1 [IU] via SUBCUTANEOUS

## 2016-08-18 MED ORDER — SODIUM CHLORIDE 0.9% FLUSH
3.0000 mL | Freq: Two times a day (BID) | INTRAVENOUS | Status: DC
  Administered 2016-08-18 – 2016-08-24 (×10): 3 mL via INTRAVENOUS

## 2016-08-18 MED ORDER — DORZOLAMIDE HCL-TIMOLOL MAL 2-0.5 % OP SOLN: 1.0000 [drp] | Freq: Two times a day (BID) | OPHTHALMIC | Status: DC

## 2016-08-18 MED ORDER — SODIUM CHLORIDE 0.9 % IV SOLN: 250.0000 mL | INTRAVENOUS | Status: DC | PRN

## 2016-08-18 MED ORDER — LEVOTHYROXINE SODIUM 100 MCG PO TABS
100.0000 ug | ORAL_TABLET | Freq: Every day | ORAL | Status: DC
  Administered 2016-08-19 – 2016-08-24 (×5): 100 ug via ORAL
  Filled 2016-08-18 (×7): qty 1

## 2016-08-18 MED ORDER — SODIUM CHLORIDE 0.9% FLUSH
3.0000 mL | INTRAVENOUS | Status: DC | PRN
  Administered 2016-08-23: 3 mL via INTRAVENOUS
  Filled 2016-08-18: qty 3

## 2016-08-18 MED ORDER — LATANOPROST 0.005 % OP SOLN
1.0000 [drp] | Freq: Every day | OPHTHALMIC | Status: DC
  Administered 2016-08-18 – 2016-08-23 (×6): 1 [drp] via OPHTHALMIC
  Filled 2016-08-18: qty 2.5

## 2016-08-18 MED ORDER — IPRATROPIUM BROMIDE 0.02 % IN SOLN: 0.5000 mg | RESPIRATORY_TRACT | Status: DC | PRN

## 2016-08-18 MED ORDER — POLYVINYL ALCOHOL 1.4 % OP SOLN
1.0000 [drp] | Freq: Four times a day (QID) | OPHTHALMIC | Status: DC
  Administered 2016-08-18 – 2016-08-24 (×21): 1 [drp] via OPHTHALMIC
  Filled 2016-08-18: qty 15

## 2016-08-18 NOTE — Progress Notes (Signed)
Pt came in by EMS on cpap. Pt taken off cpap and polaced on 4L [place] with no distress noted. Pt with Rhonchi/Coarse Crackles/ expiratory wheezes throughout. Rn aware. RT will continue to monitor.

## 2016-08-18 NOTE — Progress Notes (Signed)
Patient is from a [facility]; Soc Worker to follow up; Alexis GoodellB Deondre Marinaro RN,MHA,BSN [phone]

## 2016-08-18 NOTE — ED Provider Notes (Signed)
[facility]-EMERGENCY DEPT Provider Note   CSN: [id number] Arrival date & time: 08/18/16  1004     History   Chief Complaint Chief Complaint  Patient presents with  . Respiratory Distress    HPI Valentino HueDorcas Brightbill is a 70 y.o. female with a past medical history of hypertension, CAD, history of MI, type 2 diabetes, diastolic heart failure, history CVA, bilateral blindness presenting to the ED [facility] with acute onset shortness of breath that started this morning. She states she was just sitting when she suddenly became short of breath. She states she has never been this short of breath before. She endorses new orthopnea over the last couple of days. She denies any chest pain. She denies cough or URI symptoms. She denies fevers, but states she has had chills starting today and she is very cold. She was initially placed on NRB by EMS. She received Albuterol, Atrovent 0.5mg , and Nitroglycerin x 1 en route. She arrived on CPAP but was transitioned to 6L O2 by [place] in the ED.   She denies abdominal pain, nausea, vomiting, or lower extremity edema.  HPI  Past Medical History:  Diagnosis Date  . Anemia   . 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 CAD (coronary artery disease)   . CHF (congestive heart failure) (HCC)   . Chronic kidney disease (CKD), stage II (mild)    [name]/notes 10/07/2014  . GERD (gastroesophageal reflux disease)   . Glaucoma, both eyes   . Heart failure (HCC)   . High cholesterol   . History of blood transfusion 2003   "related to OHS"  . Hypertension   . Hypothyroidism   . Macular edema    hx  . Old myocardial infarct    "I was told I've had one; don't know when" (10/08/2014)  . Pneumonia 1990's X 1  . Stroke [facility](HCC)    "I've been told I've had one; I don't remember having any reaction from it at all"; denies residual on 10/08/2014  . Type II diabetes mellitus [facility](HCC)     Patient Active Problem List   Diagnosis Date Noted  . Acute encephalopathy 01/18/2016  .  Acute kidney injury (HCC) 01/15/2016  . Anemia due to other cause 03/07/2015  . UTI (urinary tract infection) 03/04/2015  . Right acetabular fracture (HCC) 03/02/2015  . Chronic diastolic heart failure (HCC) 12/31/2014  . AKI (acute kidney injury) (HCC)   . Staphylococcus aureus bacteremia 11/23/2014  . Fever   . Acute diastolic heart failure, NYHA class 1 (HCC) 11/19/2014  . Acute on chronic diastolic heart failure (HCC)   . Coronary artery disease involving native coronary artery of native heart without angina pectoris   . Essential hypertension   . CKD (chronic kidney disease) stage 4, GFR 15-29 ml/min (HCC) 10/07/2014  . Hypothyroidism 10/07/2014  . Dyslipidemia 10/07/2014  . GERD (gastroesophageal reflux disease) 10/07/2014  . Acute respiratory failure with hypoxia (HCC) 10/07/2014  . Sinus bradycardia 10/07/2014  . Anemia 10/07/2014  . Diabetes mellitus type II, uncontrolled (HCC)   . Hypertension   . CAD (coronary artery disease)     Past Surgical History:  Procedure Laterality Date  . CATARACT EXTRACTION W/ INTRAOCULAR LENS  IMPLANT, BILATERAL Bilateral   . CORONARY ARTERY BYPASS GRAFT  01/2002   [facility]    OB History    No data available       Home Medications    Prior to Admission medications   Medication Sig Start  Date End Date Taking? Authorizing Provider  acetaminophen (TYLENOL) 500 MG tablet Take 500 mg by mouth every 4 (four) hours as needed for mild pain, fever or headache.    Historical Provider, MD  alum & mag hydroxide-simeth (MINTOX) 200-200-20 MG/5ML suspension Take 30 mLs by mouth as needed for indigestion or heartburn.    Historical Provider, MD  amLODipine (NORVASC) 10 MG tablet Take 10 mg by mouth daily. 09/27/14   Historical Provider, MD  atorvastatin (LIPITOR) 80 MG tablet Take 80 mg by mouth at bedtime. 09/21/14   Historical Provider, MD  cefpodoxime (VANTIN) 200 MG tablet Take 1 tablet (200 mg total) by mouth daily. 01/21/16    Calvert Cantor, MD  cloNIDine (CATAPRES) 0.1 MG tablet Take 0.1 mg by mouth 2 (two) times daily.  07/08/15   Historical Provider, MD  clopidogrel (PLAVIX) 75 MG tablet Take 75 mg by mouth daily. 09/24/14   Historical Provider, MD  dorzolamide-timolol (COSOPT) 22.3-6.8 MG/ML ophthalmic solution Place 1 drop into both eyes every 12 (twelve) hours. 10/06/14   Historical Provider, MD  dronabinol (MARINOL) 2.5 MG capsule Take 1 capsule (2.5 mg total) by mouth 3 (three) times daily before meals. 01/21/16   Calvert Cantor, MD  hydrALAZINE (APRESOLINE) 100 MG tablet Take 1 tablet (100 mg total) by mouth every 8 (eight) hours. 11/03/14   Joseph Art, DO  insulin lispro (HUMALOG KWIKPEN) 100 UNIT/ML KiwkPen Inject 0-12 Units into the skin 4 (four) times daily. Sliding scale 0-150= 0 units 151-200= 2 units 201-250= 4 units 251-300= 6 units 301-350= 8 units 351-400= 10 units 401-450= 12 units 451< contact MD    Historical Provider, MD  isosorbide dinitrate (ISORDIL) 20 MG tablet Take 20 mg by mouth 3 (three) times daily.    Historical Provider, MD  latanoprost (XALATAN) 0.005 % ophthalmic solution Place 1 drop into the right eye at bedtime.    Historical Provider, MD  levothyroxine (SYNTHROID, LEVOTHROID) 100 MCG tablet Take 1 tablet (100 mcg total) by mouth daily before breakfast. 11/26/14   Renae Fickle, MD  loperamide (IMODIUM) 2 MG capsule Take 2 mg by mouth as needed for diarrhea or loose stools.    Historical Provider, MD  magnesium hydroxide (MILK OF MAGNESIA) 400 MG/5ML suspension Take 30 mLs by mouth at bedtime as needed for mild constipation.    Historical Provider, MD  Melatonin 10 MG CAPS Take 10 mg by mouth every evening.     Historical Provider, MD  neomycin-bacitracin-polymyxin (NEOSPORIN) ointment Apply 1 application topically as directed. Apply to skin tear as needed until healed    Historical Provider, MD  Nutritional Supplements (NUTRITIONAL DRINK PO) Take 1 each by mouth 3 (three) times  daily. Mighty shakes    Historical Provider, MD  pantoprazole (PROTONIX) 20 MG tablet Take 20 mg by mouth daily.    Historical Provider, MD  PARoxetine (PAXIL) 20 MG tablet Take 20 mg by mouth daily.    Historical Provider, MD  polyvinyl alcohol (LIQUIFILM TEARS) 1.4 % ophthalmic solution Place 1 drop into the left eye 4 (four) times daily.    Historical Provider, MD  prednisoLONE acetate (PRED FORTE) 1 % ophthalmic suspension Place 1 drop into the left eye 4 (four) times daily. 09/27/14   Historical Provider, MD  Vitamin D, Ergocalciferol, (DRISDOL) 50000 UNITS CAPS capsule Take 50,000 Units by mouth See admin instructions. On the 27th day of each month    Historical Provider, MD    Family History Family History  Problem Relation Age of  Onset  . CAD Neg Hx     Social History Social History  Substance Use Topics  . Smoking status: Never Smoker  . Smokeless tobacco: Never Used  . Alcohol use No     Allergies   Epinephrine   Review of Systems Review of Systems 10 Systems reviewed and are negative for acute change except as noted in the HPI.  Physical Exam Updated Vital Signs BP 144/55   Pulse 71   Temp 97.8 F (36.6 C) (Oral)   Resp 22   SpO2 96%   Physical Exam  Constitutional: She is oriented to person, place, and time. She appears well-developed and well-nourished. No distress.  HENT:  Head: Normocephalic and atraumatic.  Eyes:  Both eyes are closed (Pt is blind)  Neck: Neck supple. No JVD present.  Cardiovascular: Normal rate and regular rhythm.   No murmur heard. Pulmonary/Chest: No respiratory distress.  Breathing somewhat comfortably on 6L O2 by [place], able to complete full sentences, slightly tachypneic, crackles in the lung bases bilaterally, occasional diffuse expiratory wheezing present, moderate air movement throughout mid and upper lung fields  Abdominal: Soft. There is no tenderness.  Musculoskeletal: She exhibits no edema or tenderness.  Lymphadenopathy:      She has no cervical adenopathy.  Neurological: She is alert and oriented to person, place, and time. No sensory deficit. She exhibits normal muscle tone.  Skin: Skin is warm and dry.  Psychiatric: She has a normal mood and affect.  Nursing note and vitals reviewed.    ED Treatments / Results  Labs (all labs ordered are listed, but only abnormal results are displayed) Labs Reviewed  COMPREHENSIVE METABOLIC PANEL - Abnormal; Notable for the following:       Result Value   CO2 19 (*)    Glucose, Bld 255 (*)    BUN 40 (*)    Creatinine, Ser 3.37 (*)    Albumin 3.4 (*)    ALT 9 (*)    GFR calc non Af Amer 13 (*)    GFR calc Af Amer 15 (*)    All other components within normal limits  CBC WITH DIFFERENTIAL/PLATELET - Abnormal; Notable for the following:    WBC 11.0 (*)    RBC 2.91 (*)    Hemoglobin 8.2 (*)    HCT 27.0 (*)    Platelets 401 (*)    Neutro Abs 9.4 (*)    All other components within normal limits  I-STAT CHEM 8, ED - Abnormal; Notable for the following:    BUN 38 (*)    Creatinine, Ser 3.60 (*)    Glucose, Bld 253 (*)    Calcium, Ion 1.10 (*)    Hemoglobin 8.5 (*)    HCT 25.0 (*)    All other components within normal limits  I-STAT TROPOININ, ED    EKG  EKG Interpretation  Date/Time:  Friday August 18 2016 10:10:37 EST Ventricular Rate:  93 PR Interval:    QRS Duration: 104 QT Interval:  387 QTC Calculation: 482 R Axis:   84 Text Interpretation:  Sinus rhythm Atrial premature complex Non-specific ST-t changes Confirmed by Denton Lank  MD, Caryn Bee ([id number]) on 08/18/2016 10:26:56 AM       Radiology Dg Chest Portable 1 View  Result Date: 08/18/2016 CLINICAL DATA:  Shortness of breath. EXAM: PORTABLE CHEST 1 VIEW COMPARISON:  One-view chest x-ray 01/15/2016. FINDINGS: The heart size is upper limits of normal. This is exaggerated by low lung volumes. Moderate edema and bilateral pleural  effusions are new. Bibasilar airspace disease likely reflects  atelectasis. Atherosclerotic calcifications are again noted at the aortic arch. Degenerate changes are noted the shoulder and thoracic spine. The visualized soft tissues and bony thorax are otherwise unremarkable. IMPRESSION: 1. Congestive heart failure. 2. Bibasilar airspace disease likely reflects atelectasis. Infection is not excluded. 3. Aortic atherosclerosis. Electronically Signed   By: Marin Robertshristopher  Mattern M.D.   On: 08/18/2016 11:08    Procedures Procedures (including critical care time)  Medications Ordered in ED Medications  furosemide (LASIX) injection 20 mg (20 mg Intravenous Given 08/18/16 1222)     Initial Impression / Assessment and Plan / ED Course  I have reviewed the triage vital signs and the nursing notes.  Pertinent labs & imaging results that were available during my care of the patient were reviewed by me and considered in my medical decision making (see chart for details).  Clinical Course    Laverle PatterDorcas is a 70 year old female with a PMH of diastolic CHF, HTN, CAD w/ hx MI, T2DM, hx CVA who presents to the ED with sudden onset SOB that started this morning. Think this may be CHF exacerbation, as Pt has bibasilar crackles and new orthopnea. PE is also a consideration given her sudden onset of shortness of breath. She does not have any clinical signs of a DVT, has not had a surgery, and is not having any chest pain. Wells score is a 0. ACS is also a consideration. Pt has a known history of CAD and has a history of MI. She denies any chest pain, but this could be a chest pain equivalent. Will order I-stat chem 8, CBC, CMP, I-stat trop, 2-view CXR to evaluate further.  11:50AM: Labs with elevated Cr to 3.37, Hgb 8.2, trop 0.02. CXR with moderate edema and bilateral pleural effusions. Will give Lasix 20mg  IV x 1.  12:45PM: Re-assessed patient. She is breathing more comfortably. She is currently on 2.5L of O2.  1:15PM: Discussed with [facility], who will admit.  Final Clinical  Impressions(s) / ED Diagnoses   Final diagnoses:  Acute on chronic diastolic congestive heart failure (HCC)  Hypoxia    New Prescriptions New Prescriptions   No medications on file     Campbell StallKaty Dodd Mayo, MD 08/18/16 1315    Campbell StallKaty Dodd Mayo, MD 08/18/16 1316    Cathren LaineKevin Steinl, MD 08/18/16 1352

## 2016-08-18 NOTE — ED Triage Notes (Addendum)
[facility]- [facility]. Initially resp distress, on NRB at 98%. Pt on CPAP on on arrival. 10mg  of albuterol, 0.5mg  atrovent, and 1 nitro PTA. Pt has DNR and MOST form at bedside.

## 2016-08-18 NOTE — Progress Notes (Signed)
[facility]. CSW following for disposition and return to SNF when medically stable and cleared for discharge.          Enos FlingAshley Omarri Eich, MSW, LCSW [facility] ED/75M Clinical Social Worker [phone]

## 2016-08-18 NOTE — ED Notes (Signed)
Pt calling out, RN to bedside, asked patient how I could help she states "I don't know." Pt denies pain. Pt reports she does not need anything at this time.

## 2016-08-18 NOTE — Progress Notes (Signed)
  Echocardiogram 2D Echocardiogram has been performed.  [name], [name] M 08/18/2016, 3:15 PM

## 2016-08-18 NOTE — H&P (Signed)
History and Physical    Taylor Padilla ZOX:[id number] DOB: July 31, 1946 DOA: 08/18/2016   PCP: No PCP Per Patient   Patient coming from:  SNF    Chief Complaint: Shortness of breath   HPI: Taylor Padilla is a 70 y.o. female with extensive  medical history including diastolic CHF, hypertension, CAD status post MI, diabetes, history of CVA, presenting to the emergency department with sudden onset of shortness of breath since this morning. The patient reported orthopnea, but not paroxysmal nocturnal dyspnea. She denies any chest pain or palpitations. She denies any history of DVT or PE. She denies any recent long distance trips, or hormonal replacement she denies any changes in her medications, and she is compliant with them. Last doses of her medications were given today. She denies any fever, however she reports chills today, feeling very cold. She is noted aware of any sick contacts, but she does live in a nursing home. She is very sedentary. She denies any nausea, vomiting, abdominal pain, appetite changes, dysuria, hematuria, lower extremity swelling. She denies any headaches, or confusion. She is legally blind.  On transit,   she was on NRB, later placed on CPAP. She was in 6 L, and now she is on 2.5 L of nasal cannula. The patient is not on oxygen at home. On transit, the patient received albuterol, nitro, and Atrovent nebulizers, with improvement of her symptoms   ED Course:  BP 144/55   Pulse 71   Temp 97.8 F (36.6 C) (Oral)   Resp 22   SpO2 96%    CXR with CHF, and by basilar airspace disease, likely atelectasis  Tn less than 0.02 Weight 59.7 kg  O2 sats 96  in 2.5 L   ECHO in 01/2016  Normal  LFV  60-65 with GR 2 DD   BNP  pending   Received IV Lasix  20 mg in ED  creatinine 3.6   Review of Systems: As per HPI otherwise 10 point review of systems negative.   Past Medical History:  Diagnosis Date  . Anemia   . 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 CAD (coronary artery disease)     . CHF (congestive heart failure) (HCC)   . Chronic kidney disease (CKD), stage II (mild)    [name] 10/07/2014  . GERD (gastroesophageal reflux disease)   . Glaucoma, both eyes   . Heart failure (HCC)   . High cholesterol   . History of blood transfusion 2003   "related to [facility]"  . Hypertension   . Hypothyroidism   . Macular edema    hx  . Old myocardial infarct    "I was told I've had one; don't know when" (10/08/2014)  . Pneumonia 1990's X 1  . Stroke [facility])    "I've been told I've had one; I don't remember having any reaction from it at all"; denies residual on 10/08/2014  . Type II diabetes mellitus (HCC)     Past Surgical History:  Procedure Laterality Date  . CATARACT EXTRACTION W/ INTRAOCULAR LENS  IMPLANT, BILATERAL Bilateral   . CORONARY ARTERY BYPASS GRAFT  01/2002   "CABG X4" at [facility]    Social History Social History   Social History  . Marital status: Widowed    Spouse name: N/A  . Number of children: N/A  . Years of education: N/A   Occupational History  . Not on file.   Social History Main Topics  . Smoking status: Never Smoker  . Smokeless tobacco:  Never Used  . Alcohol use No  . Drug use: No  . Sexual activity: No   Other Topics Concern  . Not on file   Social History Narrative   Live in [facility], moved from [place] in Dec 2015     Allergies  Allergen Reactions  . Epinephrine Other (See Comments)    Patient says she is not allergic to epi, but that it makes her jittery.  WILL RECEIVE IF NEEDED IN AN EMERGENCY.    Family History  Problem Relation Age of Onset  . CAD Neg Hx       Prior to Admission medications   Medication Sig Start Date End Date Taking? Authorizing Provider  acetaminophen (TYLENOL) 500 MG tablet Take 500 mg by mouth every 4 (four) hours as needed for mild pain, fever or headache.    Historical Provider, MD  alum & mag hydroxide-simeth (MINTOX) 200-200-20 MG/5ML suspension Take 30 mLs by  mouth as needed for indigestion or heartburn.    Historical Provider, MD  amLODipine (NORVASC) 10 MG tablet Take 10 mg by mouth daily. 09/27/14   Historical Provider, MD  atorvastatin (LIPITOR) 80 MG tablet Take 80 mg by mouth at bedtime. 09/21/14   Historical Provider, MD  cefpodoxime (VANTIN) 200 MG tablet Take 1 tablet (200 mg total) by mouth daily. 01/21/16   Calvert Cantor, MD  cloNIDine (CATAPRES) 0.1 MG tablet Take 0.1 mg by mouth 2 (two) times daily.  07/08/15   Historical Provider, MD  clopidogrel (PLAVIX) 75 MG tablet Take 75 mg by mouth daily. 09/24/14   Historical Provider, MD  dorzolamide-timolol (COSOPT) 22.3-6.8 MG/ML ophthalmic solution Place 1 drop into both eyes every 12 (twelve) hours. 10/06/14   Historical Provider, MD  dronabinol (MARINOL) 2.5 MG capsule Take 1 capsule (2.5 mg total) by mouth 3 (three) times daily before meals. 01/21/16   Calvert Cantor, MD  hydrALAZINE (APRESOLINE) 100 MG tablet Take 1 tablet (100 mg total) by mouth every 8 (eight) hours. 11/03/14   Joseph Art, DO  insulin lispro (HUMALOG KWIKPEN) 100 UNIT/ML KiwkPen Inject 0-12 Units into the skin 4 (four) times daily. Sliding scale 0-150= 0 units 151-200= 2 units 201-250= 4 units 251-300= 6 units 301-350= 8 units 351-400= 10 units 401-450= 12 units 451< contact MD    Historical Provider, MD  isosorbide dinitrate (ISORDIL) 20 MG tablet Take 20 mg by mouth 3 (three) times daily.    Historical Provider, MD  latanoprost (XALATAN) 0.005 % ophthalmic solution Place 1 drop into the right eye at bedtime.    Historical Provider, MD  levothyroxine (SYNTHROID, LEVOTHROID) 100 MCG tablet Take 1 tablet (100 mcg total) by mouth daily before breakfast. 11/26/14   Renae Fickle, MD  loperamide (IMODIUM) 2 MG capsule Take 2 mg by mouth as needed for diarrhea or loose stools.    Historical Provider, MD  magnesium hydroxide (MILK OF MAGNESIA) 400 MG/5ML suspension Take 30 mLs by mouth at bedtime as needed for mild constipation.     Historical Provider, MD  Melatonin 10 MG CAPS Take 10 mg by mouth every evening.     Historical Provider, MD  neomycin-bacitracin-polymyxin (NEOSPORIN) ointment Apply 1 application topically as directed. Apply to skin tear as needed until healed    Historical Provider, MD  Nutritional Supplements (NUTRITIONAL DRINK PO) Take 1 each by mouth 3 (three) times daily. Mighty shakes    Historical Provider, MD  pantoprazole (PROTONIX) 20 MG tablet Take 20 mg by mouth daily.  Historical Provider, MD  PARoxetine (PAXIL) 20 MG tablet Take 20 mg by mouth daily.    Historical Provider, MD  polyvinyl alcohol (LIQUIFILM TEARS) 1.4 % ophthalmic solution Place 1 drop into the left eye 4 (four) times daily.    Historical Provider, MD  prednisoLONE acetate (PRED FORTE) 1 % ophthalmic suspension Place 1 drop into the left eye 4 (four) times daily. 09/27/14   Historical Provider, MD  Vitamin D, Ergocalciferol, (DRISDOL) 50000 UNITS CAPS capsule Take 50,000 Units by mouth See admin instructions. On the 27th day of each month    Historical Provider, MD    Physical Exam:    Vitals:   08/18/16 1015 08/18/16 1045 08/18/16 1130 08/18/16 1215  BP: 168/55 (!) 164/43 164/68 144/55  Pulse: 93 73 73 71  Resp: 18 20 (!) 28 22  Temp:      TempSrc:      SpO2: 97% 98% 95% 96%       Constitutional: NAD, calm, comfortable   Vitals:   08/18/16 1015 08/18/16 1045 08/18/16 1130 08/18/16 1215  BP: 168/55 (!) 164/43 164/68 144/55  Pulse: 93 73 73 71  Resp: 18 20 (!) 28 22  Temp:      TempSrc:      SpO2: 97% 98% 95% 96%   Eyes: PERRL, lids and conjunctivae normal ENMT: Mucous membranes are moist. Posterior pharynx clear of any exudate or lesions.Normal dentition.  Neck: normal, supple, no masses, no thyromegaly Respiratory:  Essentially clear to auscultation bilaterally except for some atelectatic sounds noted , no wheezing, no crackles. Normal respiratory effort. No accessory muscle use.  Cardiovascular: Regular  rate and rhythm, no murmurs / rubs / gallops. No extremity edema. 2+ pedal pulses. No carotid bruits.  Abdomen: no tenderness, no masses palpated. No hepatosplenomegaly. Bowel sounds positive.  Musculoskeletal: no clubbing / cyanosis. No joint deformity upper and lower extremities. Good ROM, no contractures. Normal muscle tone.  Skin: no rashes, lesions, ulcers.  Neurologic: CN 2-12 grossly intact. Sensation intact, DTR normal. Strength 5/5 in all 4.  Psychiatric: Normal judgment and insight. Alert and oriented x 3. Normal mood.     Labs on Admission: I have personally reviewed following labs and imaging studies  CBC:  Recent Labs Lab 08/18/16 1025 08/18/16 1035  WBC 11.0*  --   NEUTROABS 9.4*  --   HGB 8.2* 8.5*  HCT 27.0* 25.0*  MCV 92.8  --   PLT 401*  --     Basic Metabolic Panel:  Recent Labs Lab 08/18/16 1025 08/18/16 1035  NA 139 139  K 3.5 3.5  CL 106 106  CO2 19*  --   GLUCOSE 255* 253*  BUN 40* 38*  CREATININE 3.37* 3.60*  CALCIUM 9.1  --     GFR: CrCl cannot be calculated (Unknown ideal weight.).  Liver Function Tests:  Recent Labs Lab 08/18/16 1025  AST 18  ALT 9*  ALKPHOS 81  BILITOT 0.8  PROT 7.1  ALBUMIN 3.4*   No results for input(s): LIPASE, AMYLASE in the last 168 hours. No results for input(s): AMMONIA in the last 168 hours.  Coagulation Profile: No results for input(s): INR, PROTIME in the last 168 hours.  Cardiac Enzymes: No results for input(s): CKTOTAL, CKMB, CKMBINDEX, TROPONINI in the last 168 hours.  BNP (last 3 results) No results for input(s): PROBNP in the last 8760 hours.  HbA1C: No results for input(s): HGBA1C in the last 72 hours.  CBG: No results for input(s): GLUCAP in the  last 168 hours.  Lipid Profile: No results for input(s): CHOL, HDL, LDLCALC, TRIG, CHOLHDL, LDLDIRECT in the last 72 hours.  Thyroid Function Tests: No results for input(s): TSH, T4TOTAL, FREET4, T3FREE, THYROIDAB in the last 72  hours.  Anemia Panel: No results for input(s): VITAMINB12, FOLATE, FERRITIN, TIBC, IRON, RETICCTPCT in the last 72 hours.  Urine analysis:    Component Value Date/Time   COLORURINE YELLOW 01/15/2016 2112   APPEARANCEUR TURBID (A) 01/15/2016 2112   LABSPEC 1.015 01/15/2016 2112   PHURINE 5.0 01/15/2016 2112   GLUCOSEU 250 (A) 01/15/2016 2112   HGBUR LARGE (A) 01/15/2016 2112   BILIRUBINUR NEGATIVE 01/15/2016 2112   KETONESUR NEGATIVE 01/15/2016 2112   PROTEINUR 100 (A) 01/15/2016 2112   UROBILINOGEN 0.2 07/10/2015 0401   NITRITE NEGATIVE 01/15/2016 2112   LEUKOCYTESUR LARGE (A) 01/15/2016 2112    Sepsis Labs: @LABRCNTIP (procalcitonin:4,lacticidven:4) )No results found for this or any previous visit (from the past 240 hour(s)).   Radiological Exams on Admission: Dg Chest Portable 1 View  Result Date: 08/18/2016 CLINICAL DATA:  Shortness of breath. EXAM: PORTABLE CHEST 1 VIEW COMPARISON:  One-view chest x-ray 01/15/2016. FINDINGS: The heart size is upper limits of normal. This is exaggerated by low lung volumes. Moderate edema and bilateral pleural effusions are new. Bibasilar airspace disease likely reflects atelectasis. Atherosclerotic calcifications are again noted at the aortic arch. Degenerate changes are noted the shoulder and thoracic spine. The visualized soft tissues and bony thorax are otherwise unremarkable. IMPRESSION: 1. Congestive heart failure. 2. Bibasilar airspace disease likely reflects atelectasis. Infection is not excluded. 3. Aortic atherosclerosis. Electronically Signed   By: Marin Roberts M.D.   On: 08/18/2016 11:08    EKG: Independently reviewed.  Assessment/Plan Active Problems:   Acute diastolic heart failure, NYHA class 1 (HCC)   CHF exacerbation (HCC)   Diabetes mellitus type II, uncontrolled (HCC)   Hypertension   CAD (coronary artery disease)   CKD (chronic kidney disease) stage 4, GFR 15-29 ml/min (HCC)   Hypothyroidism   Dyslipidemia    GERD (gastroesophageal reflux disease)   Acute respiratory failure with hypoxia (HCC)   Anemia   BP 144/55   Pulse 71   Temp 97.8 F (36.6 C) (Oral)   Resp 22   SpO2 96%   Acute respiratory failure with hypoxia likely due to CHF exacerbation versus infection of pulmonary source   CXR with CHF, and bibasilar airspace disease, likely atelectasis versus infection  Tn less than 0.02 Weight 59.7 kg  O2 sats 96  in 2.5 L  ECHO in 01/2016  Normal  LFV  60-65 with GR 2 DD  BNP . PAtient afebrile. WBC 11.    Received IV Lasix  20 mg in ED   Admit to Telemetry Obs   CHF order set   TSH  Lactic Acid  BNP Cycle troponins  Daily weights and strict I/O  ECHO  Lasix prn, next dose 20 mg IV in am  D Dimer. If positive, will check CTA/ Dopplers r/o PE/DVT  In addition will order CXR in am, obtain cultures,  place patient on Oral Levaquin and order Flu panel to rule out any other bacterial and viral  Source of her dyspnea   Acute on Chronic kidney disease stage 4 likely due to dehydration. PAtient makes urine.   baseline creatinine 2.2-2.3    Current Cr 3.60 Lab Results  Component Value Date   CREATININE 3.60 (H) 08/18/2016   CREATININE 3.37 (H) 08/18/2016   CREATININE 2.21 (H)  01/21/2016  CMET in am Avoid nephrotoxins   Hypertension BP 144/55   Pulse 71   Temp 97.8 F (36.6 C) (Oral)   Resp 22   SpO2 96%  Controlled Continue home anti-hypertensive medications    Hyperlipidemia Continue home statins  Hypothyroidism: -Continue home Synthroid CHeck TSH   Type II Diabetes Current blood sugar level is 253 Lab Results  Component Value Date   HGBA1C 7.8 (H) 01/16/2016  Hgb A1C Hold home oral diabetic medications.  SSI   GERD, no acute symptoms: Continue PPI   CAD, s/p CABG 2003   EKG and Troponin as above  patient is cardiac pain free at this time. Continue  meds  Anemia of chronic disease Hemoglobin on admission 8.5  . Baseline Hb in the 8s and 9s   No bleeding issues  reported  Repeat CBC in am no transfusion is indicated at this time    Deconditioning OT/PT   Depression Continue Paxil   DVT prophylaxis: Heparin  Unless bleeding issues occur or Hb drops to less than 7  Code Status:    DNR Family Communication:  Discussed with patient Disposition Plan: Expect patient to be discharged to home after condition improves Consults called:    None Admission status:Tele  Obs   WERTMAN,SARA E, PA-C [facility]   08/18/2016, 1:22 PM   Patient seen and examined.  Case and orders reviewed with PA Marlowe KaysSara Wertman.  Agree with above findings, assessment/plan and orders.   -[employer] Charisa Twitty, D.O.

## 2016-08-19 ENCOUNTER — Observation Stay (HOSPITAL_COMMUNITY): Payer: Medicare Other

## 2016-08-19 DIAGNOSIS — I5031 Acute diastolic (congestive) heart failure: Secondary | ICD-10-CM | POA: Diagnosis not present

## 2016-08-19 DIAGNOSIS — D649 Anemia, unspecified: Secondary | ICD-10-CM

## 2016-08-19 DIAGNOSIS — E118 Type 2 diabetes mellitus with unspecified complications: Secondary | ICD-10-CM

## 2016-08-19 DIAGNOSIS — J9601 Acute respiratory failure with hypoxia: Secondary | ICD-10-CM | POA: Diagnosis not present

## 2016-08-19 DIAGNOSIS — Z794 Long term (current) use of insulin: Secondary | ICD-10-CM

## 2016-08-19 DIAGNOSIS — E1165 Type 2 diabetes mellitus with hyperglycemia: Secondary | ICD-10-CM

## 2016-08-19 DIAGNOSIS — E034 Atrophy of thyroid (acquired): Secondary | ICD-10-CM

## 2016-08-19 DIAGNOSIS — N184 Chronic kidney disease, stage 4 (severe): Secondary | ICD-10-CM

## 2016-08-19 DIAGNOSIS — I5033 Acute on chronic diastolic (congestive) heart failure: Secondary | ICD-10-CM | POA: Diagnosis not present

## 2016-08-19 LAB — CBC
HCT: 21 % — ABNORMAL LOW (ref 36.0–46.0)
HEMATOCRIT: 20.2 % — AB (ref 36.0–46.0)
HEMOGLOBIN: 6.5 g/dL — AB (ref 12.0–15.0)
Hemoglobin: 6.4 g/dL — CL (ref 12.0–15.0)
MCH: 28.7 pg (ref 26.0–34.0)
MCH: 28 pg (ref 26.0–34.0)
MCHC: 31.7 g/dL (ref 30.0–36.0)
MCHC: 31 g/dL (ref 30.0–36.0)
MCV: 90.6 fL (ref 78.0–100.0)
MCV: 90.5 fL (ref 78.0–100.0)
PLATELETS: 269 10*3/uL (ref 150–400)
PLATELETS: 255 10*3/uL (ref 150–400)
RBC: 2.23 MIL/uL — ABNORMAL LOW (ref 3.87–5.11)
RBC: 2.32 MIL/uL — AB (ref 3.87–5.11)
RDW: 15.1 % (ref 11.5–15.5)
RDW: 15.1 % (ref 11.5–15.5)
WBC: 6.1 10*3/uL (ref 4.0–10.5)
WBC: 5.9 10*3/uL (ref 4.0–10.5)

## 2016-08-19 LAB — GLUCOSE, CAPILLARY
GLUCOSE-CAPILLARY: 134 mg/dL — AB (ref 65–99)
GLUCOSE-CAPILLARY: 165 mg/dL — AB (ref 65–99)
GLUCOSE-CAPILLARY: 136 mg/dL — AB (ref 65–99)
Glucose-Capillary: 143 mg/dL — ABNORMAL HIGH (ref 65–99)
Glucose-Capillary: 116 mg/dL — ABNORMAL HIGH (ref 65–99)

## 2016-08-19 LAB — HEMOGLOBIN A1C
Hgb A1c MFr Bld: 5.8 % — ABNORMAL HIGH (ref 4.8–5.6)
MEAN PLASMA GLUCOSE: 120 mg/dL

## 2016-08-19 LAB — COMPREHENSIVE METABOLIC PANEL
ALBUMIN: 2.6 g/dL — AB (ref 3.5–5.0)
ALT: 10 U/L — ABNORMAL LOW (ref 14–54)
AST: 15 U/L (ref 15–41)
Alkaline Phosphatase: 62 U/L (ref 38–126)
Anion gap: 12 (ref 5–15)
BILIRUBIN TOTAL: 0.7 mg/dL (ref 0.3–1.2)
BUN: 44 mg/dL — AB (ref 6–20)
CHLORIDE: 103 mmol/L (ref 101–111)
CO2: 21 mmol/L — AB (ref 22–32)
Calcium: 8.3 mg/dL — ABNORMAL LOW (ref 8.9–10.3)
Creatinine, Ser: 3.33 mg/dL — ABNORMAL HIGH (ref 0.44–1.00)
GFR calc Af Amer: 15 mL/min — ABNORMAL LOW (ref >60)
GFR calc non Af Amer: 13 mL/min — ABNORMAL LOW (ref >60)
GLUCOSE: 123 mg/dL — AB (ref 65–99)
POTASSIUM: 3.7 mmol/L (ref 3.5–5.1)
SODIUM: 136 mmol/L (ref 135–145)
Total Protein: 5.9 g/dL — ABNORMAL LOW (ref 6.5–8.1)

## 2016-08-19 LAB — PREPARE RBC (CROSSMATCH)

## 2016-08-19 LAB — TROPONIN I: TROPONIN I: 0.06 ng/mL — AB (ref <0.03)

## 2016-08-19 MED ORDER — TECHNETIUM TO 99M ALBUMIN AGGREGATED
4.2700 | Freq: Once | INTRAVENOUS | Status: AC | PRN
  Administered 2016-08-19: 4.27 via INTRAVENOUS

## 2016-08-19 MED ORDER — VITAMIN D (ERGOCALCIFEROL) 1.25 MG (50000 UNIT) PO CAPS: 50000.0000 [IU] | ORAL_CAPSULE | ORAL | Status: DC

## 2016-08-19 MED ORDER — CHLORHEXIDINE GLUCONATE CLOTH 2 % EX PADS
6.0000 | MEDICATED_PAD | Freq: Every day | CUTANEOUS | Status: AC
  Administered 2016-08-19 – 2016-08-22 (×4): 6 via TOPICAL

## 2016-08-19 MED ORDER — SODIUM CHLORIDE 0.9 % IV SOLN: Freq: Once | INTRAVENOUS | Status: DC

## 2016-08-19 MED ORDER — TECHNETIUM TC 99M DIETHYLENETRIAME-PENTAACETIC ACID: 31.0000 | Freq: Once | INTRAVENOUS | Status: DC | PRN

## 2016-08-19 MED ORDER — MUPIROCIN 2 % EX OINT
1.0000 "application " | TOPICAL_OINTMENT | Freq: Two times a day (BID) | CUTANEOUS | Status: AC
  Administered 2016-08-19 – 2016-08-23 (×10): 1 via NASAL
  Filled 2016-08-19: qty 22

## 2016-08-19 MED ORDER — MIRTAZAPINE 15 MG PO TABS
15.0000 mg | ORAL_TABLET | Freq: Every day | ORAL | Status: DC
  Administered 2016-08-19 – 2016-08-23 (×5): 15 mg via ORAL
  Filled 2016-08-19 (×5): qty 1

## 2016-08-19 MED ORDER — DONEPEZIL HCL 5 MG PO TABS
5.0000 mg | ORAL_TABLET | Freq: Every day | ORAL | Status: DC
  Administered 2016-08-19 – 2016-08-23 (×5): 5 mg via ORAL
  Filled 2016-08-19 (×5): qty 1

## 2016-08-19 MED ORDER — SERTRALINE HCL 50 MG PO TABS
50.0000 mg | ORAL_TABLET | Freq: Every day | ORAL | Status: DC
  Administered 2016-08-20 – 2016-08-24 (×4): 50 mg via ORAL
  Filled 2016-08-19 (×4): qty 1

## 2016-08-19 NOTE — Progress Notes (Signed)
Patient is incontinent. Patient refused in and out cath to obtain urine specimen for UA Petra Kubarica Christyana Corwin Rn 08/19/2016 5:09 AM

## 2016-08-19 NOTE — Progress Notes (Signed)
PT Cancellation Note  Patient Details Name: Valentino HueDorcas Cislo MRN: [id number] DOB: 07-04-1946   Cancelled Treatment:    Reason Eval/Treat Not Completed: Medical issues which prohibited therapy. Pt with Hgb at 6.5 this AM and about to receive two units of blood. PT will continue to f/u with pt when medically stable and appropriate for PT evaluation.   Alessandra BevelsJennifer M Jaidin Ugarte 08/19/2016, 3:24 PM

## 2016-08-19 NOTE — Progress Notes (Signed)
PROGRESS NOTE    Taylor Padilla  ZOX:[id number] DOB: Jun 17, 1946 DOA: 08/18/2016 PCP: No PCP Per Patient   Brief Narrative:  Taylor Padilla is a 70 y.o. female with extensive  medical history including diastolic CHF, hypertension, CAD status post MI, diabetes, history of CVA, presenting to the emergency department with sudden onset of shortness of breath since this morning. The patient reported orthopnea, but not paroxysmal nocturnal dyspnea. She denies any chest pain or palpitations. She denies any history of DVT or PE. She denies any recent long distance trips, or hormonal replacement she denies any changes in her medications, and she is compliant with them. Last doses of her medications were given today. She denies any fever, however she reports chills today, feeling very cold. She is noted aware of any sick contacts, but she does live in a nursing home. She is very sedentary. She denies any nausea, vomiting, abdominal pain, appetite changes, dysuria, hematuria, lower extremity swelling. She denies any headaches, or confusion. She is legally blind.  On transit,   she was on NRB, later placed on CPAP. She was in 6 L, and now she is on 2.5 L of nasal cannula. The patient is not on oxygen at home. On transit, the patient received albuterol, nitro, and Atrovent nebulizers, with improvement of her symptoms    Assessment & Plan:   Active Problems:   Diabetes mellitus type II, uncontrolled (HCC)   Hypertension   CAD (coronary artery disease)   CKD (chronic kidney disease) stage 4, GFR 15-29 ml/min (HCC)   Hypothyroidism   Dyslipidemia   GERD (gastroesophageal reflux disease)   Acute respiratory failure with hypoxia (HCC)   Anemia   Acute diastolic heart failure, NYHA class 1 (HCC)   CHF exacerbation (HCC)   Acute respiratory failure with hypoxia likely due to CHF exacerbation versus infection of pulmonary source   CXR with CHF, and bibasilar airspace disease, likely atelectasis versus infection   Tn less than 0.02 Weight 59.7 kg  O2 sats 96  in 2.5 L  ECHO in 01/2016  Normal  LFV  60-65 with GR 2 DD  BNP . PAtient afebrile. WBC 11.    Received IV Lasix  20 mg in ED  TSH WNL Lactic Acid normal BNP of 1537.5 Cycle troponins  Daily weights and strict I/O- weight down since yesterday, need strict I/O  ECHO: - Left ventricle: The cavity size was normal. There was mild concentric hypertrophy. Systolic function was normal. The estimated ejection fraction was in the range of 50% to 55%. Wall   motion was normal; there were no regional wall motion abnormalities. Mitral valve: The findings are consistent with mild stenosis. There was moderate regurgitation. Left atrium: The atrium was severely dilated. Tricuspid valve: There was moderate regurgitation. Pericardium, extracardiac: There was a left pleural effusion. Ascites was noted. Lasix 20mg  given again this am D Dimer positive V/Q scan showed no sign of PE Dopplers pending CXR in am pending Blood cultures pending Influenza A negative  Anemia of chronic disease Hemoglobin on admission 8.5  . Baseline Hb in the 8s and 9s   No bleeding issues reported  CBC this am shows drop in H/H Transfusing 2 units Place additional large bore IV Hemoccult ordered    Acute on Chronic kidney disease stage 4 likely due to dehydration. PAtient makes urine.   baseline creatinine 2.2-2.3    Current Cr 3.60 Recent Labs       Lab Results  Component Value Date   CREATININE  3.60 (H) 08/18/2016   CREATININE 3.37 (H) 08/18/2016   CREATININE 2.21 (H) 01/21/2016    Cr essentially unchanged this am Will redraw in AM (patient received additional dose of lasix and 2units PRBC)  Hypertension BP 144/55   Pulse 71   Temp 97.8 F (36.6 C) (Oral)   Resp 22   SpO2 96%  Controlled Continue home anti-hypertensive medications    Hyperlipidemia Continue home statins  Hypothyroidism: -Continue home Synthroid TSH WNL  Type II Diabetes Current blood  sugar level is 253 Recent Labs       Lab Results  Component Value Date   HGBA1C 7.8 (H) 01/16/2016    Hgb A1C Hold home oral diabetic medications.  SSI   GERD, no acute symptoms: Continue PPI   CAD, s/p CABG 2003   EKG and Troponin as above  patient is cardiac pain free at this time. Continue  meds   Deconditioning OT/PT   Depression D/c Paxil Continue Zoloft  DVT prophylaxis: Heparin discontinued as Hb dropped to less than 7  Code Status:    DNR Family Communication:  Discussed with patient Disposition Plan: Expect patient to be discharged to home after condition improves   Consultants:   PT/OT  Procedures:   None  Antimicrobials:   None    Subjective: Patient seen this morning.  She is sleeping but upon awakening states she feels a bit better than yesterday when she came in.  Still voices some shortness of breath.  No chest pain, no respiratory pain, no abdominal pain.  Objective: Vitals:   08/18/16 1730 08/18/16 1945 08/19/16 0043 08/19/16 0457  BP: (!) 163/52 (!) 153/54 (!) 142/52 (!) 155/57  Pulse:  71 60 (!) 55  Resp:  18 18 18   Temp:  99.1 F (37.3 C) 98.8 F (37.1 C) 98.2 F (36.8 C)  TempSrc:  Oral Oral Oral  SpO2:  93% 93% 94%  Weight:    62.2 kg (137 lb 1.6 oz)  Height:        Intake/Output Summary (Last 24 hours) at 08/19/16 1344 Last data filed at 08/19/16 0919  Gross per 24 hour  Intake              960 ml  Output                0 ml  Net              960 ml   Filed Weights   08/18/16 1425 08/19/16 0457  Weight: 63.5 kg (140 lb) 62.2 kg (137 lb 1.6 oz)    Examination:  General exam: Appears calm and comfortable  Respiratory system: Respiratory effort normal. Few rhonchi in lung bases Cardiovascular system: S1 & S2 heard, RRR. No JVD, murmurs, rubs, gallops or clicks. No pedal edema. Gastrointestinal system: Abdomen is nondistended, soft and nontender. No organomegaly or masses felt. Normal bowel sounds  heard. Central nervous system: Alert and oriented x 3. No focal neurological deficits. Extremities: Symmetric 5 x 5 power. Skin: No rashes, lesions or ulcers Psychiatry: Judgement and insight appear normal. Mood & affect appropriate.     Data Reviewed: I have personally reviewed following labs and imaging studies  CBC:  Recent Labs Lab 08/18/16 1025 08/18/16 1035 08/19/16 0555 08/19/16 0928  WBC 11.0*  --  6.1 5.9  NEUTROABS 9.4*  --   --   --   HGB 8.2* 8.5* 6.4* 6.5*  HCT 27.0* 25.0* 20.2* 21.0*  MCV 92.8  --  90.6  90.5  PLT 401*  --  269 255   Basic Metabolic Panel:  Recent Labs Lab 08/18/16 1025 08/18/16 1035 08/19/16 0400  NA 139 139 136  K 3.5 3.5 3.7  CL 106 106 103  CO2 19*  --  21*  GLUCOSE 255* 253* 123*  BUN 40* 38* 44*  CREATININE 3.37* 3.60* 3.33*  CALCIUM 9.1  --  8.3*   GFR: Estimated Creatinine Clearance: 13.1 mL/min (by C-G formula based on SCr of 3.33 mg/dL (H)). Liver Function Tests:  Recent Labs Lab 08/18/16 1025 08/19/16 0400  AST 18 15  ALT 9* 10*  ALKPHOS 81 62  BILITOT 0.8 0.7  PROT 7.1 5.9*  ALBUMIN 3.4* 2.6*   No results for input(s): LIPASE, AMYLASE in the last 168 hours. No results for input(s): AMMONIA in the last 168 hours. Coagulation Profile: No results for input(s): INR, PROTIME in the last 168 hours. Cardiac Enzymes:  Recent Labs Lab 08/18/16 1615 08/18/16 2145 08/19/16 0400  TROPONINI 0.04* 0.05* 0.06*   BNP (last 3 results) No results for input(s): PROBNP in the last 8760 hours. HbA1C:  Recent Labs  08/18/16 1615  HGBA1C 5.8*   CBG:  Recent Labs Lab 08/18/16 1702 08/18/16 2143 08/19/16 0603 08/19/16 1105  GLUCAP 178* 157* 134* 165*   Lipid Profile: No results for input(s): CHOL, HDL, LDLCALC, TRIG, CHOLHDL, LDLDIRECT in the last 72 hours. Thyroid Function Tests:  Recent Labs  08/18/16 1615  TSH 1.212   Anemia Panel: No results for input(s): VITAMINB12, FOLATE, FERRITIN, TIBC, IRON,  RETICCTPCT in the last 72 hours. Sepsis Labs:  Recent Labs Lab 08/18/16 1615 08/18/16 1659  LATICACIDVEN 1.3 1.3    Recent Results (from the past 240 hour(s))  MRSA PCR Screening     Status: Abnormal   Collection Time: 08/18/16  2:36 PM  Result Value Ref Range Status   MRSA by PCR POSITIVE (A) NEGATIVE Final    Comment:        The GeneXpert MRSA Assay (FDA approved for NASAL specimens only), is one component of a comprehensive MRSA colonization surveillance program. It is not intended to diagnose MRSA infection nor to guide or monitor treatment for MRSA infections. RESULT CALLED TO, READ BACK BY AND VERIFIED WITH: Yevonne Aline RN 16:20 08/18/16 ([email or web address])          Radiology Studies: Dg Chest Portable 1 View  Result Date: 08/18/2016 CLINICAL DATA:  Shortness of breath. EXAM: PORTABLE CHEST 1 VIEW COMPARISON:  One-view chest x-ray 01/15/2016. FINDINGS: The heart size is upper limits of normal. This is exaggerated by low lung volumes. Moderate edema and bilateral pleural effusions are new. Bibasilar airspace disease likely reflects atelectasis. Atherosclerotic calcifications are again noted at the aortic arch. Degenerate changes are noted the shoulder and thoracic spine. The visualized soft tissues and bony thorax are otherwise unremarkable. IMPRESSION: 1. Congestive heart failure. 2. Bibasilar airspace disease likely reflects atelectasis. Infection is not excluded. 3. Aortic atherosclerosis. Electronically Signed   By: Marin Roberts M.D.   On: 08/18/2016 11:08        Scheduled Meds: . sodium chloride   Intravenous Once  . amLODipine  10 mg Oral Daily  . atorvastatin  80 mg Oral QHS  . Chlorhexidine Gluconate Cloth  6 each Topical Q0600  . cloNIDine  0.1 mg Oral BID  . clopidogrel  75 mg Oral Daily  . dorzolamide  1 drop Both Eyes BID   And  . timolol  1 drop Both Eyes BID  .  dronabinol  2.5 mg Oral TID AC  . heparin  5,000 Units Subcutaneous Q8H  . hydrALAZINE   100 mg Oral Q8H  . insulin aspart  0-9 Units Subcutaneous TID WC  . isosorbide dinitrate  20 mg Oral TID  . latanoprost  1 drop Right Eye QHS  . levofloxacin  250 mg Oral Daily  . levothyroxine  100 mcg Oral QAC breakfast  . Melatonin  9 mg Oral QPM  . mupirocin ointment  1 application Nasal BID  . pantoprazole  20 mg Oral Daily  . PARoxetine  20 mg Oral Daily  . polyvinyl alcohol  1 drop Left Eye QID  . prednisoLONE acetate  1 drop Left Eye QID  . sodium chloride flush  3 mL Intravenous Q12H   Continuous Infusions:   LOS: 0 days    Time spent: 35 minutes    Katrinka BlazingAlex U Kadolph, MD [facility] Pager [phone]  If 7PM-7AM, please contact night-coverage [email or web address] Password [email or web address] 08/19/2016, 1:44 PM

## 2016-08-20 ENCOUNTER — Observation Stay (HOSPITAL_BASED_OUTPATIENT_CLINIC_OR_DEPARTMENT_OTHER): Payer: Medicare Other

## 2016-08-20 ENCOUNTER — Encounter (HOSPITAL_COMMUNITY): Payer: Self-pay | Admitting: Nurse Practitioner

## 2016-08-20 DIAGNOSIS — R0602 Shortness of breath: Secondary | ICD-10-CM | POA: Diagnosis not present

## 2016-08-20 DIAGNOSIS — I5032 Chronic diastolic (congestive) heart failure: Secondary | ICD-10-CM | POA: Diagnosis not present

## 2016-08-20 DIAGNOSIS — K219 Gastro-esophageal reflux disease without esophagitis: Secondary | ICD-10-CM | POA: Diagnosis present

## 2016-08-20 DIAGNOSIS — E1122 Type 2 diabetes mellitus with diabetic chronic kidney disease: Secondary | ICD-10-CM | POA: Diagnosis present

## 2016-08-20 DIAGNOSIS — J9811 Atelectasis: Secondary | ICD-10-CM | POA: Diagnosis present

## 2016-08-20 DIAGNOSIS — I5031 Acute diastolic (congestive) heart failure: Secondary | ICD-10-CM | POA: Diagnosis not present

## 2016-08-20 DIAGNOSIS — H548 Legal blindness, as defined in USA: Secondary | ICD-10-CM | POA: Diagnosis present

## 2016-08-20 DIAGNOSIS — R509 Fever, unspecified: Secondary | ICD-10-CM | POA: Diagnosis not present

## 2016-08-20 DIAGNOSIS — N179 Acute kidney failure, unspecified: Secondary | ICD-10-CM | POA: Diagnosis present

## 2016-08-20 DIAGNOSIS — F329 Major depressive disorder, single episode, unspecified: Secondary | ICD-10-CM | POA: Diagnosis present

## 2016-08-20 DIAGNOSIS — I2510 Atherosclerotic heart disease of native coronary artery without angina pectoris: Secondary | ICD-10-CM | POA: Diagnosis present

## 2016-08-20 DIAGNOSIS — N184 Chronic kidney disease, stage 4 (severe): Secondary | ICD-10-CM | POA: Diagnosis not present

## 2016-08-20 DIAGNOSIS — N185 Chronic kidney disease, stage 5: Secondary | ICD-10-CM | POA: Diagnosis present

## 2016-08-20 DIAGNOSIS — Z66 Do not resuscitate: Secondary | ICD-10-CM | POA: Diagnosis present

## 2016-08-20 DIAGNOSIS — I132 Hypertensive heart and chronic kidney disease with heart failure and with stage 5 chronic kidney disease, or end stage renal disease: Secondary | ICD-10-CM | POA: Diagnosis present

## 2016-08-20 DIAGNOSIS — I10 Essential (primary) hypertension: Secondary | ICD-10-CM | POA: Diagnosis not present

## 2016-08-20 DIAGNOSIS — E785 Hyperlipidemia, unspecified: Secondary | ICD-10-CM | POA: Diagnosis present

## 2016-08-20 DIAGNOSIS — E039 Hypothyroidism, unspecified: Secondary | ICD-10-CM | POA: Diagnosis not present

## 2016-08-20 DIAGNOSIS — I110 Hypertensive heart disease with heart failure: Secondary | ICD-10-CM | POA: Diagnosis not present

## 2016-08-20 DIAGNOSIS — I38 Endocarditis, valve unspecified: Secondary | ICD-10-CM

## 2016-08-20 DIAGNOSIS — R188 Other ascites: Secondary | ICD-10-CM | POA: Diagnosis present

## 2016-08-20 DIAGNOSIS — M7120 Synovial cyst of popliteal space [Baker], unspecified knee: Secondary | ICD-10-CM | POA: Diagnosis present

## 2016-08-20 DIAGNOSIS — R0902 Hypoxemia: Secondary | ICD-10-CM | POA: Diagnosis present

## 2016-08-20 DIAGNOSIS — R54 Age-related physical debility: Secondary | ICD-10-CM | POA: Diagnosis present

## 2016-08-20 DIAGNOSIS — K210 Gastro-esophageal reflux disease with esophagitis: Secondary | ICD-10-CM

## 2016-08-20 DIAGNOSIS — I5033 Acute on chronic diastolic (congestive) heart failure: Secondary | ICD-10-CM | POA: Diagnosis present

## 2016-08-20 DIAGNOSIS — E1165 Type 2 diabetes mellitus with hyperglycemia: Secondary | ICD-10-CM | POA: Diagnosis present

## 2016-08-20 DIAGNOSIS — H409 Unspecified glaucoma: Secondary | ICD-10-CM | POA: Diagnosis present

## 2016-08-20 DIAGNOSIS — I2721 Secondary pulmonary arterial hypertension: Secondary | ICD-10-CM | POA: Diagnosis present

## 2016-08-20 DIAGNOSIS — D638 Anemia in other chronic diseases classified elsewhere: Secondary | ICD-10-CM | POA: Diagnosis present

## 2016-08-20 DIAGNOSIS — D649 Anemia, unspecified: Secondary | ICD-10-CM | POA: Diagnosis not present

## 2016-08-20 DIAGNOSIS — E860 Dehydration: Secondary | ICD-10-CM | POA: Diagnosis present

## 2016-08-20 DIAGNOSIS — R001 Bradycardia, unspecified: Secondary | ICD-10-CM | POA: Diagnosis present

## 2016-08-20 DIAGNOSIS — F0390 Unspecified dementia without behavioral disturbance: Secondary | ICD-10-CM | POA: Diagnosis present

## 2016-08-20 DIAGNOSIS — J9601 Acute respiratory failure with hypoxia: Secondary | ICD-10-CM | POA: Diagnosis present

## 2016-08-20 LAB — COMPREHENSIVE METABOLIC PANEL
ALT: 10 U/L — ABNORMAL LOW (ref 14–54)
ANION GAP: 14 (ref 5–15)
AST: 16 U/L (ref 15–41)
Albumin: 2.9 g/dL — ABNORMAL LOW (ref 3.5–5.0)
Alkaline Phosphatase: 69 U/L (ref 38–126)
BUN: 44 mg/dL — ABNORMAL HIGH (ref 6–20)
CHLORIDE: 102 mmol/L (ref 101–111)
CO2: 20 mmol/L — AB (ref 22–32)
CREATININE: 3.48 mg/dL — AB (ref 0.44–1.00)
Calcium: 8.7 mg/dL — ABNORMAL LOW (ref 8.9–10.3)
GFR calc Af Amer: 14 mL/min — ABNORMAL LOW (ref >60)
GFR, EST NON AFRICAN AMERICAN: 12 mL/min — AB (ref >60)
Glucose, Bld: 100 mg/dL — ABNORMAL HIGH (ref 65–99)
POTASSIUM: 3.6 mmol/L (ref 3.5–5.1)
SODIUM: 136 mmol/L (ref 135–145)
Total Bilirubin: 1 mg/dL (ref 0.3–1.2)
Total Protein: 6.3 g/dL — ABNORMAL LOW (ref 6.5–8.1)

## 2016-08-20 LAB — GLUCOSE, CAPILLARY
GLUCOSE-CAPILLARY: 105 mg/dL — AB (ref 65–99)
GLUCOSE-CAPILLARY: 114 mg/dL — AB (ref 65–99)
GLUCOSE-CAPILLARY: 140 mg/dL — AB (ref 65–99)
GLUCOSE-CAPILLARY: 105 mg/dL — AB (ref 65–99)

## 2016-08-20 LAB — CBC WITH DIFFERENTIAL/PLATELET
Basophils Absolute: 0 10*3/uL (ref 0.0–0.1)
Basophils Relative: 0 %
EOS ABS: 0.3 10*3/uL (ref 0.0–0.7)
EOS PCT: 3 %
HCT: 29.9 % — ABNORMAL LOW (ref 36.0–46.0)
Hemoglobin: 9.6 g/dL — ABNORMAL LOW (ref 12.0–15.0)
LYMPHS ABS: 0.7 10*3/uL (ref 0.7–4.0)
LYMPHS PCT: 9 %
MCH: 29.2 pg (ref 26.0–34.0)
MCHC: 32.1 g/dL (ref 30.0–36.0)
MCV: 90.9 fL (ref 78.0–100.0)
MONO ABS: 0.5 10*3/uL (ref 0.1–1.0)
Monocytes Relative: 6 %
Neutro Abs: 6.1 10*3/uL (ref 1.7–7.7)
Neutrophils Relative %: 81 %
PLATELETS: 253 10*3/uL (ref 150–400)
RBC: 3.29 MIL/uL — AB (ref 3.87–5.11)
RDW: 14.5 % (ref 11.5–15.5)
WBC: 7.5 10*3/uL (ref 4.0–10.5)

## 2016-08-20 LAB — TYPE AND SCREEN
BLOOD PRODUCT EXPIRATION DATE: 201801022359
Blood Product Expiration Date: 201801022359
ISSUE DATE / TIME: 201712161738
ISSUE DATE / TIME: 201712161443
UNIT TYPE AND RH: 5100
Unit Type and Rh: 5100

## 2016-08-20 MED ORDER — FUROSEMIDE 10 MG/ML IJ SOLN
20.0000 mg | Freq: Once | INTRAMUSCULAR | Status: AC
  Administered 2016-08-20: 20 mg via INTRAVENOUS
  Filled 2016-08-20: qty 2

## 2016-08-20 MED ORDER — ORAL CARE MOUTH RINSE
15.0000 mL | Freq: Two times a day (BID) | OROMUCOSAL | Status: DC
  Administered 2016-08-21 – 2016-08-24 (×6): 15 mL via OROMUCOSAL

## 2016-08-20 MED ORDER — FUROSEMIDE 10 MG/ML IJ SOLN
40.0000 mg | Freq: Two times a day (BID) | INTRAMUSCULAR | Status: DC
  Administered 2016-08-20 – 2016-08-22 (×4): 40 mg via INTRAVENOUS
  Filled 2016-08-20 (×4): qty 4

## 2016-08-20 MED ORDER — HEPARIN SODIUM (PORCINE) 5000 UNIT/ML IJ SOLN
5000.0000 [IU] | Freq: Three times a day (TID) | INTRAMUSCULAR | Status: DC
  Administered 2016-08-20 – 2016-08-24 (×11): 5000 [IU] via SUBCUTANEOUS
  Filled 2016-08-20 (×11): qty 1

## 2016-08-20 NOTE — Progress Notes (Signed)
*  PRELIMINARY RESULTS* Vascular Ultrasound Bilateral lower extremity venous duplex has been completed.  Preliminary findings: No evidence of deep vein thrombosis bilaterally.  Left sided baker's cyst noted.   Taylor FischerCharlotte C Parag Padilla 08/20/2016, 8:54 AM

## 2016-08-20 NOTE — Progress Notes (Signed)
Occupational Therapy Evaluation Patient Details Name: Taylor [name] MRN: [id number] DOB: Nov 05, 1945 Today's Date: 08/20/2016    History of Present Illness 70 y/o ? with a h/o CAD s/p CABG in 2003, chronic diastolic CHF, HTN, HL, DM, CKD III-IV, glaucoma w/ legal blindness, valvular heart disease, hypothyroidism, and normocytic anemia, who was admitted 12/15 with dyspnea and found to be volume overloaded and subsequently anemic.   Clinical Impression   Pt is SNF resident and legally blind. Pt is total A for ADL, although she is capable to completing some ADL with set up/S. Recommend finger foods to increase her ability to self feed and drinks with lids. Recommend setting pt up with food tray and using "clock technique" to increase her ability to self feed. Pt states she gets up to w/c daily but does not ambulate. Recommend nsg get pt OOB daily to chair with +2 mod A and stand pivot to chair. Any further OT can be deferred to SNF. OT signing off.     Follow Up Recommendations  SNF;Supervision/Assistance - 24 hour    Equipment Recommendations  None recommended by OT    Recommendations for Other Services       Precautions / Restrictions Precautions Precautions: Fall      Mobility Bed Mobility Overal bed mobility: Needs Assistance Bed Mobility: Supine to Sit     Supine to sit: Min assist        Transfers Overall transfer level: Needs assistance Equipment used: 2 person hand held assist Transfers: Sit to/from BJ'sStand;Stand Pivot Transfers Sit to Stand: +2 physical assistance;Mod assist Stand pivot transfers: Mod assist;+2 physical assistance            Balance Overall balance assessment: Needs assistance   Sitting balance-Leahy Scale: Fair       Standing balance-Leahy Scale: Poor                              ADL Overall ADL's : At baseline      Pt incontinent of urine. Cleaned with total A.     Pt set up with drink. Pt able ot hold own  drink. Pt would do well with finger foods.                                   Vision  legally blind   Perception     Praxis      Pertinent Vitals/Pain Pain Assessment: Faces Faces Pain Scale: Hurts a little bit Pain Location: back Pain Descriptors / Indicators: Discomfort Pain Intervention(s): Limited activity within patient's tolerance     Hand Dominance Right   Extremity/Trunk Assessment Upper Extremity Assessment Upper Extremity Assessment: Generalized weakness   Lower Extremity Assessment Lower Extremity Assessment: Defer to PT evaluation   Cervical / Trunk Assessment Cervical / Trunk Assessment: Kyphotic   Communication Communication Communication: No difficulties   Cognition Arousal/Alertness: Awake/alert Behavior During Therapy: Flat affect;Anxious Overall Cognitive Status: No family/caregiver present to determine baseline cognitive functioning                     General Comments       Exercises       Shoulder Instructions      Home Living Family/patient expects to be discharged to:: Skilled nursing facility  Prior Functioning/Environment Level of Independence: Needs assistance  Gait / Transfers Assistance Needed: nonambulatory. gets up in wc daily with help of staff ADL's / Homemaking Assistance Needed: total A            OT Problem List: Decreased activity tolerance;Impaired vision/perception;Impaired balance (sitting and/or standing);Cardiopulmonary status limiting activity   OT Treatment/Interventions:      OT Goals(Current goals can be found in the care plan section) Acute Rehab OT Goals Patient Stated Goal: to have someone feed me OT Goal Formulation: All assessment and education complete, DC therapy  OT Frequency:     Barriers to D/C:            Co-evaluation PT/OT/SLP Co-Evaluation/Treatment: Yes (parital session) Reason for Co-Treatment: Complexity  of the patient's impairments (multi-system involvement);Necessary to address cognition/behavior during functional activity;For patient/therapist safety   OT goals addressed during session: ADL's and self-care      End of Session Nurse Communication: Mobility status  Activity Tolerance: Patient tolerated treatment well Patient left: in chair;with call bell/phone within reach;with chair alarm set   Time: 1200-1227 OT Time Calculation (min): 27 min Charges:  OT General Charges $OT Visit: 1 Procedure OT Evaluation $OT Eval Moderate Complexity: 1 Procedure G-Codes: OT G-codes **NOT FOR INPATIENT CLASS** Functional Assessment Tool Used: clinical judgement Functional Limitation: Self care Self Care Current Status ([id number]): At least 80 percent but less than 100 percent impaired, limited or restricted Self Care Goal Status ([id number]): At least 80 percent but less than 100 percent impaired, limited or restricted Self Care Discharge Status [id number]([id number]): At least 80 percent but less than 100 percent impaired, limited or restricted  Taylor Padilla,Taylor Padilla 08/20/2016, 2:01 PM   [facility] Hospitalil[facility] Mikenzi Padilla, OT/L  [phone] 08/20/2016

## 2016-08-20 NOTE — Consult Note (Addendum)
Renal Service Consult Note [facility]  Taylor Padilla 08/20/2016 Roney Jaffe D Requesting Physician:  Dr Adair Patter  Reason for Consult:   HPI: The patient is a 70 y.o. year-old with history of diast CHF, CAD sp CABG, HTN, DM and hx CVA, , blind both eyes since 2005, pHTN, macular edema, HL, glaucoma and CKD stage IV.  Presented 12/15 with sudden onset SOB and orthopnea.  CXR showed bilat pulm edema with effusions. Admitted and diuresed with IV lasix.  Wearing diapers and UOP not recorded. Repeat  CXR yest showed improved pulm edema with some residual congestion and effusions. Pt feeling better. Asked to see for [facility].   Seen by renal team in July 2017 when creat was 4.7 due to vol depletion, improved to 2.2 at dc with IVF's.  Renal US showed 10 cm kidneys.    Patient gives following history, she is vague historian.  No PCP, grew up in [place], graduated HS , worked as a Art therapist for a Industrial/product designer.  Then not sure what she did after that.  Married, no kids, husband died 2 yrs ago per pt , not sure the cause.  Blind since 2005.  No tob /etoh, lives in [place]. Requires assist with feeding and dressing.  Has a sister and a brother here living.  Doesn't know the answer to questions about code status, says to talk with her sister Lattie Haw.    [place] - CABG '03   ECHO 12/ 15 > LVEF 50-55%, severe pulm HTN  Date   Creat  eGFR  CKD stage 2016    1.8- 2.3 20-27  IV Jan 2017  2.42  19  IV May 2017  4.7 > 2.2 8- 21  IV-V Dec 2017 (now) 3.3- 3.6 12- 13  V  ROS  denies CP  no joint pain   no HA  no blurry vision  no rash  no diarrhea  no nausea/ vomiting  no dysuria  no difficulty voiding  no change in urine color    Past Medical History  Past Medical History:  Diagnosis Date  . Blind in both eyes    a. legally blind since 2005 per pt.  Marland Kitchen CAD (coronary artery disease)    a. 01/2002 s/p CABG x 4.  . Chronic diastolic CHF (congestive heart failure) ([place])    a. 08/2016 Echo:  EF 50-55%, no rwma, mild MS, mod MR, sev dil LA, nl RV fxn, mod TR, PASP 93mHg.  . CKD (chronic kidney disease), [place]   . GERD (gastroesophageal reflux disease)   . Glaucoma, both eyes    a. legally blind.  . High cholesterol   . History of blood transfusion 2003   "related to OHS"  . Hypertensive heart disease   . Hypothyroidism   . Macular edema    hx  . Normocytic anemia    a. 08/2016 s/p 2u PRBCs for H/H 6/20.  .Marland KitchenPAH (pulmonary artery hypertension)    a. 08/2016 Echo: PASP 870mg.  . [facility] Kitchen[facility] 1990's X 1  . [facility]   "I've been told I've had one; I don't remember having any reaction from it at all"; denies residual on 10/08/2014  . [place]  . Valvular heart disease    a. 08/2016 Echo: Mild MS, Mod MR/TR.   Past Surgical History  Past Surgical History:  Procedure Laterality Date  . CATARACT EXTRACTION W/ INTRAOCULAR LENS  IMPLANT, BILATERAL Bilateral   . CORONARY ARTERY BYPASS  GRAFT  01/2002   "CABG X4" at [place]  Family History  Problem Relation Age of Onset  . CAD Neg Hx    Social History  reports that she has never smoked. She has never used smokeless tobacco. She reports that she does not drink alcohol or use drugs. Allergies  Allergies  Allergen Reactions  . Epinephrine Other (See Comments)    Patient says she is not allergic to epi, but that it makes her jittery.  WILL RECEIVE IF NEEDED IN AN EMERGENCY.   Home medications Prior to Admission medications   Medication Sig Start Date End Date Taking? Authorizing Provider  acetaminophen (TYLENOL) 500 MG tablet Take 500 mg by mouth every 4 (four) hours as needed for fever (pain).    Yes Historical Provider, MD  amLODipine (NORVASC) 10 MG tablet Take 10 mg by mouth daily. 09/27/14  Yes Historical Provider, MD  atorvastatin (LIPITOR) 80 MG tablet Take 80 mg by mouth at bedtime. 09/21/14  Yes Historical Provider, MD  Calcium & Magnesium Carbonates (MYLANTA PO)  Take 30 mLs by mouth daily as needed (indigestion).   Yes Historical Provider, MD  Cholecalciferol (VITAMIN D3) 50000 units CAPS Take 50,000 Units by mouth every 30 (thirty) days. Take 1 capsule (50,000 units) by mouth on the 10th of each month   Yes Historical Provider, MD  cloNIDine (CATAPRES) 0.1 MG tablet Take 0.1 mg by mouth 2 (two) times daily. 8am, 4pm 07/08/15  Yes Historical Provider, MD  clopidogrel (PLAVIX) 75 MG tablet Take 75 mg by mouth daily. 09/24/14  Yes Historical Provider, MD  donepezil (ARICEPT) 5 MG tablet Take 5 mg by mouth at bedtime.   Yes Historical Provider, MD  dorzolamide-timolol (COSOPT) 22.3-6.8 MG/ML ophthalmic solution Place 1 drop into both eyes every 12 (twelve) hours. 10/06/14  Yes Historical Provider, MD  furosemide (LASIX) 20 MG tablet Take 20 mg by mouth daily.   Yes Historical Provider, MD  hydrALAZINE (APRESOLINE) 100 MG tablet Take 1 tablet (100 mg total) by mouth every 8 (eight) hours. 11/03/14  Yes Geradine Girt, DO  insulin aspart (NOVOLOG) 100 UNIT/ML injection Inject 2-12 Units into the skin 4 (four) times daily. Per sliding scale: CBG 151-200 2 units, 201-250 4 units, 251-300 6 units, 301-350 8 units, 351-400 10 units, 401-450 12 units, Call MD for CBG >451 or <60   Yes Historical Provider, MD  isosorbide dinitrate (ISORDIL) 20 MG tablet Take 20 mg by mouth 3 (three) times daily. 9am, 1pm, 5pm   Yes Historical Provider, MD  latanoprost (XALATAN) 0.005 % ophthalmic solution Place 1 drop into the right eye at bedtime.   Yes Historical Provider, MD  levothyroxine (SYNTHROID, LEVOTHROID) 100 MCG tablet Take 1 tablet (100 mcg total) by mouth daily before breakfast. 11/26/14  Yes Janece Canterbury, MD  loperamide (IMODIUM A-D) 2 MG tablet Take 2 mg by mouth every 3 (three) hours as needed for diarrhea or loose stools. Maximum 8 tablets in 24 hours   Yes Historical Provider, MD  Magnesium Hydroxide (MILK OF MAGNESIA PO) Take 30 mLs by mouth at bedtime as needed  (constipation).   Yes Historical Provider, MD  Melatonin 3 MG TABS Take 3 mg by mouth at bedtime.   Yes Historical Provider, MD  mirtazapine (REMERON) 15 MG tablet Take 15 mg by mouth at bedtime. For appetite   Yes Historical Provider, MD  Nutritional Supplements (NUTRITIONAL SUPPLEMENT PO) Take 90 mLs by mouth 3 (three) times daily. Med Pass  Yes Historical Provider, MD  ondansetron (ZOFRAN) 4 MG tablet Take 4 mg by mouth every 6 (six) hours as needed for nausea or vomiting.   Yes Historical Provider, MD  OXYGEN Inhale 2 L into the lungs See admin instructions. "Apply 2 liters per minute via nasal canula for shortness of breath"   Yes Historical Provider, MD  pantoprazole (PROTONIX) 20 MG tablet Take 20 mg by mouth daily at 6 (six) AM.    Yes Historical Provider, MD  polyvinyl alcohol (LIQUIFILM TEARS) 1.4 % ophthalmic solution Place 1 drop into the left eye 4 (four) times daily.   Yes Historical Provider, MD  prednisoLONE acetate (PRED FORTE) 1 % ophthalmic suspension Place 1 drop into the left eye 4 (four) times daily. 09/27/14  Yes Historical Provider, MD  sertraline (ZOLOFT) 50 MG tablet Take 50 mg by mouth daily.   Yes Historical Provider, MD  dronabinol (MARINOL) 2.5 MG capsule Take 1 capsule (2.5 mg total) by mouth 3 (three) times daily before meals. Patient not taking: Reported on 08/18/2016 01/21/16   Debbe Odea, MD   Liver Function Tests  Recent Labs Lab 08/18/16 1025 08/19/16 0400 08/20/16 0417  AST 18 15 16   ALT 9* 10* 10*  ALKPHOS 81 62 69  BILITOT 0.8 0.7 1.0  PROT 7.1 5.9* 6.3*  ALBUMIN 3.4* 2.6* 2.9*   No results for input(s): LIPASE, AMYLASE in the last 168 hours. CBC  Recent Labs Lab 08/18/16 1025  08/19/16 0555 08/19/16 0928 08/20/16 0417  WBC 11.0*  --  6.1 5.9 7.5  NEUTROABS 9.4*  --   --   --  6.1  HGB 8.2*  < > 6.4* 6.5* 9.6*  HCT 27.0*  < > 20.2* 21.0* 29.9*  MCV 92.8  --  90.6 90.5 90.9  PLT 401*  --  269 255 253  < > = values in this interval not  displayed. Basic Metabolic Panel  Recent Labs Lab 08/18/16 1025 08/18/16 1035 08/19/16 0400 08/20/16 0417  NA 139 139 136 136  K 3.5 3.5 3.7 3.6  CL 106 106 103 102  CO2 19*  --  21* 20*  GLUCOSE 255* 253* 123* 100*  BUN 40* 38* 44* 44*  CREATININE 3.37* 3.60* 3.33* 3.48*  CALCIUM 9.1  --  8.3* 8.7*   Iron/TIBC/Ferritin/ %Sat    Component Value Date/Time   IRON 23 (L) 11/19/2014 1520   TIBC 324 11/19/2014 1520   FERRITIN 26 11/19/2014 1520   IRONPCTSAT 7 (L) 11/19/2014 1520    Vitals:   08/19/16 1818 08/19/16 1949 08/20/16 0626 08/20/16 1241  BP: (!) 137/47 (!) 146/52 (!) 162/63 (!) 152/58  Pulse: 62 (!) 45 (!) 59 85  Resp: 20 18 20 20   Temp: 97.7 F (36.5 C) 98.2 F (36.8 C) 97.9 F (36.6 C) 98.1 F (36.7 C)  TempSrc: Oral Oral Oral Oral  SpO2: 99% 98% 94% 98%  Weight:   63.1 kg (139 lb 1.6 oz)   Height:       Exam Gen pleasant blind elderly WF, chron ill appearing, lying 40 deg in the bed, in diapers, no distress, coughs off and on No rash, cyanosis or gangrene Sclera anicteric, throat clear  +JVD Chest rales R base 1/3 up, L clear RRR 2/6 holosyst M, no RG Abd soft ntnd no mass or ascites +bs obese GU defer MS no joint effusions or deformity Ext + bilat pretib pitting edema / no wounds or ulcers Neuro is alert, nonfocal  CXR 12/15 -  CHF bilat, moderate edema with bilat effusions CXR 12/16 - improving CHF  Date   Creat  eGFR  CKD stage 2016    1.8- 2.3 20-27  IV Jan 2017  2.42  19  IV May 2017  4.7 > 2.2 8- 21  IV-V Dec 2017 (now) 3.3- 3.6 12- 13  V  Na 136  K 3.6  CO2 20  Cr 3.48  eGFR 12  Glu 100  Alb 2.9  CA 8.7   WBC 7K Hb 9.6  Assessment: 1. CKD stage 4/5 - progressive renal failure over the last 2 years.  Not good candidate for dialysis . w multiple comorbidities.  DNR.  Recommend medical / conservative Rx.  Will continue IV lasix 40 q 12.  Still has edema and SOB lying flat. Diurese until SOB better. Creat may rise some into high 3's with  diuresis, or possibly not.  Will sign off.   2. Pulm edema - as above   3. HTN 4. Blind both eyes 5. Hx CABG 6. Dementia - aricept    Plan - IV lasix, foley, no dialysis  Kelly Splinter MD [place] pager [phone]   08/20/2016, 6:42 PM

## 2016-08-20 NOTE — Evaluation (Signed)
Physical Therapy Evaluation Patient Details Name: Valentino HueDorcas Heffern MRN: [id number] DOB: December 21, 1945 Today's Date: 08/20/2016   History of Present Illness  70 y/o ? with a h/o CAD s/p CABG in 2003, chronic diastolic CHF, HTN, HL, DM, CKD III-IV, glaucoma w/ legal blindness, valvular heart disease, hypothyroidism, and normocytic anemia, who was admitted 12/15 with dyspnea and found to be volume overloaded and subsequently anemic.  Clinical Impression  Patient seen for evaluation. Patient demonstrates deficits in functional mobility as indicated below. Will need continued skilled PT to address deficits and maximize function. Will see as indicated and progress as tolerated. Recommend return to SNF upon acute discharge.    Follow Up Recommendations SNF    Equipment Recommendations  None recommended by PT    Recommendations for Other Services       Precautions / Restrictions Precautions Precautions: Fall      Mobility  Bed Mobility Overal bed mobility: Needs Assistance Bed Mobility: Supine to Sit     Supine to sit: Min assist        Transfers Overall transfer level: Needs assistance Equipment used: 2 person hand held assist Transfers: Sit to/from Stand;Stand Pivot Transfers Sit to Stand: +2 physical assistance;Mod assist Stand pivot transfers: Mod assist;+2 physical assistance          Ambulation/Gait                Stairs            Wheelchair Mobility    Modified Rankin (Stroke Patients Only)       Balance Overall balance assessment: Needs assistance   Sitting balance-Leahy Scale: Fair       Standing balance-Leahy Scale: Poor                               Pertinent Vitals/Pain Pain Assessment: Faces Faces Pain Scale: Hurts a little bit Pain Location: back Pain Descriptors / Indicators: Discomfort Pain Intervention(s): Limited activity within patient's tolerance    Home Living Family/patient expects to be discharged to::  Skilled nursing facility                      Prior Function Level of Independence: Needs assistance   Gait / Transfers Assistance Needed: nonambulatory. gets up in wc daily with help of staff  ADL's / Homemaking Assistance Needed: total A        Hand Dominance   Dominant Hand: Right    Extremity/Trunk Assessment   Upper Extremity Assessment Upper Extremity Assessment: Generalized weakness    Lower Extremity Assessment Lower Extremity Assessment: Generalized weakness    Cervical / Trunk Assessment Cervical / Trunk Assessment: Kyphotic  Communication   Communication: No difficulties  Cognition Arousal/Alertness: Awake/alert Behavior During Therapy: Flat affect;Anxious Overall Cognitive Status: No family/caregiver present to determine baseline cognitive functioning                      General Comments      Exercises     Assessment/Plan    PT Assessment Patient needs continued PT services  PT Problem List Decreased strength;Decreased activity tolerance;Decreased balance;Decreased mobility;Decreased safety awareness;Pain          PT Treatment Interventions DME instruction;Gait training;Functional mobility training;Therapeutic exercise;Therapeutic activities;Balance training;Cognitive remediation;Patient/family education    PT Goals (Current goals can be found in the Care Plan section)  Acute Rehab PT Goals Patient Stated Goal: to have someone feed me PT  Goal Formulation: With patient Time For Goal Achievement: 09/03/16 Potential to Achieve Goals: Good    Frequency Min 2X/week   Barriers to discharge        Co-evaluation PT/OT/SLP Co-Evaluation/Treatment: Yes Reason for Co-Treatment: Complexity of the patient's impairments (multi-system involvement);Necessary to address cognition/behavior during functional activity;For patient/therapist safety PT goals addressed during session: Mobility/safety with mobility OT goals addressed during  session: ADL's and self-care       End of Session Equipment Utilized During Treatment: Gait belt;Oxygen Activity Tolerance: Patient limited by fatigue Patient left: in chair;with call bell/phone within reach;with chair alarm set Nurse Communication: Mobility status    Functional Assessment Tool Used: clinical judgement Functional Limitation: Mobility: Walking and moving around Mobility: Walking and Moving Around Current Status (W0981(G8978): At least 60 percent but less than 80 percent impaired, limited or restricted Mobility: Walking and Moving Around Goal Status [id number]([id number]): At least 40 percent but less than 60 percent impaired, limited or restricted    Time: 1215-1227 PT Time Calculation (min) (ACUTE ONLY): 12 min   Charges:   PT Evaluation $PT Eval Moderate Complexity: 1 Procedure     PT G Codes:   PT G-Codes **NOT FOR INPATIENT CLASS** Functional Assessment Tool Used: clinical judgement Functional Limitation: Mobility: Walking and moving around Mobility: Walking and Moving Around Current Status (W2956(G8978): At least 60 percent but less than 80 percent impaired, limited or restricted Mobility: Walking and Moving Around Goal Status [phone](G8979): At least 40 percent but less than 60 percent impaired, limited or restricted    Fabio AsaDevon J Kember Boch 08/20/2016, 2:07 PM Charlotte Crumbevon Rexann Lueras, PT DPT  [phone]

## 2016-08-20 NOTE — Progress Notes (Signed)
Foley catheter ordered for aggressive diuresis. Patient educated about indications for Foley. Patient  Refused Foley to be inserted. MD notified.   Will continue to monitor.   Andreyah Natividad, RN

## 2016-08-20 NOTE — Progress Notes (Signed)
Refusing IN and Out Cath.

## 2016-08-20 NOTE — Plan of Care (Signed)
Problem: Safety: Goal: Ability to remain free from injury will improve Outcome: Progressing Safety precautions and fall preventions maintained  Problem: Pain Managment: Goal: General experience of comfort will improve Outcome: Progressing Denies pain   Problem: Bowel/Gastric: Goal: Will not experience complications related to bowel motility Outcome: Progressing No gastric or bowel complications noted

## 2016-08-20 NOTE — Progress Notes (Signed)
PROGRESS NOTE    Taylor HueDorcas Mehringer  ZOX:[id number] DOB: 1945-12-22 DOA: 08/18/2016 PCP: No PCP Per Patient   Brief Narrative:  Taylor Padilla is a 70 y.o. female with extensive  medical history including diastolic CHF, hypertension, CAD status post MI, diabetes, history of CVA, presenting to the emergency department with sudden onset of shortness of breath since this morning. The patient reported orthopnea, but not paroxysmal nocturnal dyspnea. She denies any chest pain or palpitations. She denies any history of DVT or PE. She denies any recent long distance trips, or hormonal replacement she denies any changes in her medications, and she is compliant with them. Last doses of her medications were given today. She denies any fever, however she reports chills today, feeling very cold. She is noted aware of any sick contacts, but she does live in a nursing home. She is very sedentary. She denies any nausea, vomiting, abdominal pain, appetite changes, dysuria, hematuria, lower extremity swelling. She denies any headaches, or confusion. She is legally blind.  On transit,   she was on NRB, later placed on CPAP. She was in 6 L, and now she is on 2.5 L of nasal cannula. The patient is not on oxygen at home. On transit, the patient received albuterol, nitro, and Atrovent nebulizers, with improvement of her symptoms    Assessment & Plan:   Active Problems:   Diabetes mellitus type II, uncontrolled (HCC)   Hypertension   CAD (coronary artery disease)   CKD (chronic kidney disease) stage 4, GFR 15-29 ml/min (HCC)   Hypothyroidism   Dyslipidemia   GERD (gastroesophageal reflux disease)   Acute respiratory failure with hypoxia (HCC)   Anemia   Acute diastolic heart failure, NYHA class 1 (HCC)   CHF exacerbation (HCC)   Hypertensive heart disease   Valvular heart disease   Type II diabetes mellitus (HCC)   PAH (pulmonary artery hypertension)   Normocytic anemia   Acute respiratory failure with hypoxia  likely due to CHF exacerbation versus infection of pulmonary source    Initial CXR with CHF, and bibasilar airspace disease, likely atelectasis versus infection TSH WNL Lactic Acid normal BNP of 1537.5  Daily weights and strict I/O- weight down since yesterday, need strict I/O  ECHO: - Left ventricle: The cavity size was normal. There was mild concentric hypertrophy. Systolic function was normal. The estimated ejection fraction was in the range of 50% to 55%. Wall   motion was normal; there were no regional wall motion abnormalities. Mitral valve: The findings are consistent with mild stenosis. There was moderate regurgitation. Left atrium: The atrium was severely dilated. Tricuspid valve: There was moderate regurgitation. Pericardium, extracardiac: There was a left pleural effusion. Ascites was noted. Lasix 20mg  IV given again this am D Dimer positive but V/Q scan showed no sign of PE Dopplers preliminary negative for DVT CXR yesterday am shows improvement in infiltrates Blood cultures pending (NG <24 hours) Influenza A negative Cardiology consulted  Anemia of chronic disease Hemoglobin on admission 8.5  . Baseline Hb in the 8s and 9s   No bleeding issues reported  CBC yesterday showed acute drop in H/H Transfused 2 units Place additional large bore IV Hemoccult ordered but no stools at this time Repeat CBC in am    Acute on Chronic kidney disease stage 4 likely due to dehydration. PAtient makes urine.   baseline creatinine 2.2-2.3    Current Cr 3.48 Consulted Nephrology  Hypertension BP 144/55   Pulse 71   Temp 97.8 F (36.6  C) (Oral)   Resp 22   SpO2 96%  Controlled Continue home anti-hypertensive medications   Hyperlipidemia Continue home statins  Hypothyroidism: -Continue home Synthroid TSH WNL  Type II Diabetes  Hgb A1C of 5.8 Hold home oral diabetic medications.  SSI CBG's well controlled at this time  GERD, no acute symptoms: Continue PPI   CAD,  s/p CABG 2003   EKG and Troponin as above  patient. Continue  meds  Deconditioning OT/PT   Depression D/c Paxil Continue Zoloft  DVT prophylaxis: Can restart heparin today  Code Status:    DNR Family Communication:  Discussed with patient and her sister in law Disposition Plan: Expect patient to be discharged to home after condition improves   Consultants:   PT/OT  Cardiology  Nephrology  Procedures:   None  Antimicrobials:   None    Subjective: Went to see patient earlier but patient she was getting vascular ultrasound.  On next evaluation patient is sitting in chair listening to television.  She denies any chest pain, chest pressure, palpitations, abdominal pain.  Does say she has some shortness of breath; does not feel breathing has improved.  Objective: Vitals:   08/19/16 1730 08/19/16 1818 08/19/16 1949 08/20/16 0626  BP: (!) 117/44 (!) 137/47 (!) 146/52 (!) 162/63  Pulse: 67 62 (!) 45 (!) 59  Resp: 19 20 18 20   Temp: 98 F (36.7 C) 97.7 F (36.5 C) 98.2 F (36.8 C) 97.9 F (36.6 C)  TempSrc: Oral Oral Oral Oral  SpO2: 96% 99% 98% 94%  Weight:    63.1 kg (139 lb 1.6 oz)  Height:        Intake/Output Summary (Last 24 hours) at 08/20/16 1135 Last data filed at 08/19/16 1949  Gross per 24 hour  Intake           931.33 ml  Output                0 ml  Net           931.33 ml   Filed Weights   08/18/16 1425 08/19/16 0457 08/20/16 0626  Weight: 63.5 kg (140 lb) 62.2 kg (137 lb 1.6 oz) 63.1 kg (139 lb 1.6 oz)    Examination:  General exam: Appears calm and comfortable  Respiratory system: Bibasilar rales, wearing [place] O2, no increased work of breathing Cardiovascular system: S1 & S2 heard, RRR. No JVD, murmurs, rubs, gallops or clicks. No pedal edema. Gastrointestinal system: Abdomen is nondistended, soft and nontender. No organomegaly or masses felt. Normal bowel sounds heard. Central nervous system: Alert and oriented x 3. No focal neurological  deficits. Extremities: Symmetric 5 x 5 power. Skin: No rashes, lesions or ulcers Psychiatry: Judgement and insight appear normal. Mood & affect appropriate.     Data Reviewed: I have personally reviewed following labs and imaging studies  CBC:  Recent Labs Lab 08/18/16 1025 08/18/16 1035 08/19/16 0555 08/19/16 0928 08/20/16 0417  WBC 11.0*  --  6.1 5.9 7.5  NEUTROABS 9.4*  --   --   --  6.1  HGB 8.2* 8.5* 6.4* 6.5* 9.6*  HCT 27.0* 25.0* 20.2* 21.0* 29.9*  MCV 92.8  --  90.6 90.5 90.9  PLT 401*  --  269 255 253   Basic Metabolic Panel:  Recent Labs Lab 08/18/16 1025 08/18/16 1035 08/19/16 0400 08/20/16 0417  NA 139 139 136 136  K 3.5 3.5 3.7 3.6  CL 106 106 103 102  CO2 19*  --  21* 20*  GLUCOSE 255* 253* 123* 100*  BUN 40* 38* 44* 44*  CREATININE 3.37* 3.60* 3.33* 3.48*  CALCIUM 9.1  --  8.3* 8.7*   GFR: Estimated Creatinine Clearance: 12.6 mL/min (by C-G formula based on SCr of 3.48 mg/dL (H)). Liver Function Tests:  Recent Labs Lab 08/18/16 1025 08/19/16 0400 08/20/16 0417  AST 18 15 16   ALT 9* 10* 10*  ALKPHOS 81 62 69  BILITOT 0.8 0.7 1.0  PROT 7.1 5.9* 6.3*  ALBUMIN 3.4* 2.6* 2.9*   No results for input(s): LIPASE, AMYLASE in the last 168 hours. No results for input(s): AMMONIA in the last 168 hours. Coagulation Profile: No results for input(s): INR, PROTIME in the last 168 hours. Cardiac Enzymes:  Recent Labs Lab 08/18/16 1615 08/18/16 2145 08/19/16 0400  TROPONINI 0.04* 0.05* 0.06*   BNP (last 3 results) No results for input(s): PROBNP in the last 8760 hours. HbA1C:  Recent Labs  08/18/16 1615  HGBA1C 5.8*   CBG:  Recent Labs Lab 08/19/16 1443 08/19/16 1630 08/19/16 2104 08/20/16 0622 08/20/16 1129  GLUCAP 136* 143* 116* 105* 114*   Lipid Profile: No results for input(s): CHOL, HDL, LDLCALC, TRIG, CHOLHDL, LDLDIRECT in the last 72 hours. Thyroid Function Tests:  Recent Labs  08/18/16 1615  TSH 1.212   Anemia  Panel: No results for input(s): VITAMINB12, FOLATE, FERRITIN, TIBC, IRON, RETICCTPCT in the last 72 hours. Sepsis Labs:  Recent Labs Lab 08/18/16 1615 08/18/16 1659  LATICACIDVEN 1.3 1.3    Recent Results (from the past 240 hour(s))  MRSA PCR Screening     Status: Abnormal   Collection Time: 08/18/16  2:36 PM  Result Value Ref Range Status   MRSA by PCR POSITIVE (A) NEGATIVE Final    Comment:        The GeneXpert MRSA Assay (FDA approved for NASAL specimens only), is one component of a comprehensive MRSA colonization surveillance program. It is not intended to diagnose MRSA infection nor to guide or monitor treatment for MRSA infections. RESULT CALLED TO, READ BACK BY AND VERIFIED WITH: Yevonne AlineL. King RN 16:20 08/18/16 ([email or web address])   Culture, blood (Routine X 2) w Reflex to ID Panel     Status: None (Preliminary result)   Collection Time: 08/18/16  4:15 PM  Result Value Ref Range Status   Specimen Description BLOOD LEFT ANTECUBITAL  Final   Special Requests IN PEDIATRIC BOTTLE 2CC  Final   Culture NO GROWTH < 24 HOURS  Final   Report Status PENDING  Incomplete  Culture, blood (Routine X 2) w Reflex to ID Panel     Status: None (Preliminary result)   Collection Time: 08/18/16  4:21 PM  Result Value Ref Range Status   Specimen Description BLOOD RIGHT HAND  Final   Special Requests BOTTLES DRAWN AEROBIC ONLY 5CC  Final   Culture NO GROWTH < 24 HOURS  Final   Report Status PENDING  Incomplete         Radiology Studies: Dg Chest 2 View  Result Date: 08/19/2016 CLINICAL DATA:  [facility]- pt coming from [facility] nursing facility. Initially resp distress, on NRB at 98%. Pt on CPAP on on arrival. Hx of CHF, GERD, hypertension, stroke, myocardia infarction, and pneumonia. Hx of CABG in 2003. EXAM: CHEST - 2 VIEW COMPARISON:  08/18/2016 FINDINGS: Mild interstitial edema and central below vascular congestion, improved since previous. Small pleural effusions left greater than right,  improved. Mild cardiomegaly.  Atheromatous aorta. Previous CABG. IMPRESSION: 1. Improving interstitial edema  and pleural effusions. Electronically Signed   By: Corlis Leak M.D.   On: 08/19/2016 13:54   Nm Pulmonary Perf And Vent  Result Date: 08/19/2016 CLINICAL DATA:  Respiratory failure EXAM: NUCLEAR MEDICINE VENTILATION - PERFUSION LUNG SCAN TECHNIQUE: Ventilation images were obtained in multiple projections using inhaled aerosol Tc-1m DTPA. Perfusion images were obtained in multiple projections after intravenous injection of Tc-19m MAA. RADIOPHARMACEUTICALS:  31.0 mCi Technetium-3m DTPA aerosol inhalation and 4.3 mCi Technetium-32m MAA IV COMPARISON:  Chest x-ray 08/18/2016 FINDINGS: Ventilation: Patchy ventilation defects throughout the lungs, best seen in the right lung Perfusion: No wedge shaped peripheral perfusion defects to suggest acute pulmonary embolism. IMPRESSION: No perfusion defects to suggest pulmonary embolus. Electronically Signed   By: Charlett Nose M.D.   On: 08/19/2016 13:48        Scheduled Meds: . sodium chloride   Intravenous Once  . amLODipine  10 mg Oral Daily  . atorvastatin  80 mg Oral QHS  . Chlorhexidine Gluconate Cloth  6 each Topical Q0600  . cloNIDine  0.1 mg Oral BID  . clopidogrel  75 mg Oral Daily  . donepezil  5 mg Oral QHS  . dorzolamide  1 drop Both Eyes BID   And  . timolol  1 drop Both Eyes BID  . hydrALAZINE  100 mg Oral Q8H  . insulin aspart  0-9 Units Subcutaneous TID WC  . isosorbide dinitrate  20 mg Oral TID  . latanoprost  1 drop Right Eye QHS  . levofloxacin  250 mg Oral Daily  . levothyroxine  100 mcg Oral QAC breakfast  . Melatonin  9 mg Oral QPM  . mirtazapine  15 mg Oral QHS  . mupirocin ointment  1 application Nasal BID  . pantoprazole  20 mg Oral Daily  . polyvinyl alcohol  1 drop Left Eye QID  . prednisoLONE acetate  1 drop Left Eye QID  . sertraline  50 mg Oral Daily  . sodium chloride flush  3 mL Intravenous Q12H  .  [START ON 09/13/2016] Vitamin D (Ergocalciferol)  50,000 Units Oral Q30 days   Continuous Infusions:   LOS: 0 days    Time spent: 35 minutes    Katrinka Blazing, MD [facility] Pager [phone]  If 7PM-7AM, please contact night-coverage [email or web address] Password [email or web address] 08/20/2016, 11:35 AM

## 2016-08-20 NOTE — Consult Note (Signed)
Cardiology Consult    Patient ID: Tyniya Kuyper MRN: [id number], DOB/AGE: 1946-07-03   Admit date: 08/18/2016 Date of Consult: 08/20/2016  Primary Physician: No PCP Per Patient Primary Cardiologist: D. Bensimhon, MD (last seen in CHF clinic in 2016) Requesting Provider: A. Hugelmeyer  Patient Profile    70 y/o ? with a h/o CAD s/p CABG in 2003, chronic diastolic CHF, HTN, HL, DM, CKD III-IV, glaucoma w/ legal blindness, valvular heart disease, hypothyroidism, and normocytic anemia, who was admitted 12/15 with dyspnea and found to be volume overloaded and subsequently anemic.  Past Medical History   Past Medical History:  Diagnosis Date  . Blind in both eyes    a. legally blind since 2005 per pt.  Marland Kitchen CAD (coronary artery disease)    a. 01/2002 s/p CABG x 4.  . Chronic diastolic CHF (congestive heart failure) (HCC)    a. 08/2016 Echo: EF 50-55%, no rwma, mild MS, mod MR, sev dil LA, nl RV fxn, mod TR, PASP .  . CKD (chronic kidney disease), stage IV (HCC)   . GERD (gastroesophageal reflux disease)   . Glaucoma, both eyes    a. legally blind.  . High cholesterol   . History of blood transfusion 2003   "related to OHS"  . Hypertensive heart disease   . Hypothyroidism   . Macular edema    hx  . Normocytic anemia    a. 08/2016 s/p 2u PRBCs for H/H 6/20.  Marland Kitchen PAH (pulmonary artery hypertension)    a. 08/2016 Echo: PASP .  Marland Kitchen Pneumonia 1990's X 1  . Stroke [facility])    "I've been told I've had one; I don't remember having any reaction from it at all"; denies residual on 10/08/2014  . Type II diabetes mellitus (HCC)   . Valvular heart disease    a. 08/2016 Echo: Mild MS, Mod MR/TR.    Past Surgical History:  Procedure Laterality Date  . CATARACT EXTRACTION W/ INTRAOCULAR LENS  IMPLANT, BILATERAL Bilateral   . CORONARY ARTERY BYPASS GRAFT  01/2002   "CABG X4" at [facility]     Allergies  Allergies  Allergen Reactions  . Epinephrine Other (See Comments)    Patient  says she is not allergic to epi, but that it makes her jittery.  WILL RECEIVE IF NEEDED IN AN EMERGENCY.    History of Present Illness    70 y/o ? with the above complex PMH including CAD s/p CABG x 3 in 2004, chronic diast CHF, progressive CKD (now stage IV), HTN, HL, DM, glaucoma w/ blindness, hypothyroidism, and normocytic anemia.  She had previously been seen by our CHF team - last visit 07/2015.  She was admitted earlier this year with anorexia, wkns, and UTI.  At that time diuretic therapy was d/c'd.  She has apparently been doing well and says that her appetite has improved since that admission in May.  Weight has stabilized.  It appears low dose lasix therapy was also added back to her medical regimen.  She is not able to weigh herself @ home and relies on the kitchen @ the SNF for meals.  She does not add salt to food and tries to avoid soups and lunch meats.  She is not aware of any lower ext edema, early satiety, or change in abd girth.  She was in her USOH until 12/15 when she developed acute onset of dyspnea.  She denies chest pain.  She was taken to the [facility] ED where ECG was non-acute.  Troponin was initially normal.  BNP was elevated @ 1537.5 and CXR showed interstitial edema.  She briefly required BiPap.  She has chronic, normocytic anemia and H/H were @ baseline on admission.  Creat was elevated above baseline @ 3.37.  She was admitted and treated with IV lasix with 3 lbs wt loss (I/O inaccurate 2/2 incontinence).  Echo was performed and showed Nl EF with PAH (PASP 83 mmHg) and valvular heart disease including mod TR/MR (progressed from mild in 01/2016).  D dimer was elevated however V:Q scan was low prob.  LE u/s showed a baker's cyst on the left.  No DVT.    On 12/16, she was noted to be more anemic w/ a drop in H/H to 6.4/20.2.  She was transfused w/ 2 units prbcs.  FOB pending.  Pt is not aware of any dark stools or brbpr, though blindness prevents her evaluation of stools.  We've been  asked to eval related to diast chf and now mildly elevated troponin w flat trend.  She denies any c/p and says that she thinks breathing has improved some.  Inpatient Medications    . sodium chloride   Intravenous Once  . amLODipine  10 mg Oral Daily  . atorvastatin  80 mg Oral QHS  . Chlorhexidine Gluconate Cloth  6 each Topical Q0600  . cloNIDine  0.1 mg Oral BID  . clopidogrel  75 mg Oral Daily  . donepezil  5 mg Oral QHS  . dorzolamide  1 drop Both Eyes BID   And  . timolol  1 drop Both Eyes BID  . furosemide  20 mg Intravenous Once  . hydrALAZINE  100 mg Oral Q8H  . insulin aspart  0-9 Units Subcutaneous TID WC  . isosorbide dinitrate  20 mg Oral TID  . latanoprost  1 drop Right Eye QHS  . levofloxacin  250 mg Oral Daily  . levothyroxine  100 mcg Oral QAC breakfast  . Melatonin  9 mg Oral QPM  . mirtazapine  15 mg Oral QHS  . mupirocin ointment  1 application Nasal BID  . pantoprazole  20 mg Oral Daily  . polyvinyl alcohol  1 drop Left Eye QID  . prednisoLONE acetate  1 drop Left Eye QID  . sertraline  50 mg Oral Daily  . sodium chloride flush  3 mL Intravenous Q12H  . [START ON 09/13/2016] Vitamin D (Ergocalciferol)  50,000 Units Oral Q30 days    Family History    Family History  Problem Relation Age of Onset  . CAD Neg Hx     Social History    Social History   Social History  . Marital status: Widowed    Spouse name: N/A  . Number of children: N/A  . Years of education: N/A   Occupational History  . Not on file.   Social History Main Topics  . Smoking status: Never Smoker  . Smokeless tobacco: Never Used  . Alcohol use No  . Drug use: No  . Sexual activity: No   Other Topics Concern  . Not on file   Social History Narrative   Live in [facility], moved from [place] in Dec 2015.     Review of Systems    General:  No chills, fever, night sweats or weight changes.  Cardiovascular:  No chest pain, +++ dyspnea on day of  admission.  No edema, orthopnea, palpitations, paroxysmal nocturnal dyspnea. Dermatological: No rash, lesions/masses Respiratory: No cough, +++ dyspnea  Urologic: No hematuria, dysuria Abdominal:   No nausea, vomiting, diarrhea, bright red blood per rectum, melena, or hematemesis Neurologic:  No visual changes, wkns, changes in mental status. All other systems reviewed and are otherwise negative except as noted above.  Physical Exam    Blood pressure (!) 162/63, pulse (!) 59, temperature 97.9 F (36.6 C), temperature source Oral, resp. rate 20, height 5\' 1"  (1.549 m), weight 139 lb 1.6 oz (63.1 kg), SpO2 94 %.  General: Pleasant, NAD, Sitting in bed side at chair Psych: flat affect. Neuro: Alert and oriented X 3. Moves all extremities spontaneously. HEENT: Normal  Neck: Supple without bruits.  JVP ~ 12 cm. Lungs:  Resp regular and unlabored, bibasilar crackles. Heart: RRR, 2/6 syst murmur noted throughout, loudest @ LLSB  [place].  No s3, s4. Abdomen: Soft, non-tender, non-distended, BS + x 4.  Extremities: No clubbing, cyanosis or edema. DP/PT/Radials 2+ and equal bilaterally.  Labs    Troponin [facility](Point of Care Test)  Recent Labs  08/18/16 1033  TROPIPOC 0.02    Recent Labs  08/18/16 1615 08/18/16 2145 08/19/16 0400  TROPONINI 0.04* 0.05* 0.06*   Lab Results  Component Value Date   WBC 7.5 08/20/2016   HGB 9.6 (L) 08/20/2016   HCT 29.9 (L) 08/20/2016   MCV 90.9 08/20/2016   PLT 253 08/20/2016    Recent Labs Lab 08/20/16 0417  NA 136  K 3.6  CL 102  CO2 20*  BUN 44*  CREATININE 3.48*  CALCIUM 8.7*  PROT 6.3*  BILITOT 1.0  ALKPHOS 69  ALT 10*  AST 16  GLUCOSE 100*   Lab Results  Component Value Date   DDIMER 1.64 (H) 08/18/2016     Radiology Studies    Dg Chest 2 View  Result Date: 08/19/2016 CLINICAL DATA:  [facility]- pt coming from [facility] facility. Initially resp distress, on NRB at 98%. Pt on CPAP on on arrival. Hx of CHF, GERD,  hypertension, stroke, myocardia infarction, and pneumonia. Hx of CABG in 2003. EXAM: CHEST - 2 VIEW COMPARISON:  08/18/2016 FINDINGS: Mild interstitial edema and central below vascular congestion, improved since previous. Small pleural effusions left greater than right, improved. Mild cardiomegaly.  Atheromatous aorta. Previous CABG. IMPRESSION: 1. Improving interstitial edema and pleural effusions. Electronically Signed   By: Corlis Leak  Hassell M.D.   On: 08/19/2016 13:54   Nm Pulmonary Perf And Vent  Result Date: 08/19/2016 CLINICAL DATA:  Respiratory failure EXAM: NUCLEAR MEDICINE VENTILATION - PERFUSION LUNG SCAN TECHNIQUE: Ventilation images were obtained in multiple projections using inhaled aerosol Tc-5286m DTPA. Perfusion images were obtained in multiple projections after intravenous injection of Tc-8386m MAA. RADIOPHARMACEUTICALS:  31.0 mCi Technetium-9886m DTPA aerosol inhalation and 4.3 mCi Technetium-1686m MAA IV COMPARISON:  Chest x-ray 08/18/2016 FINDINGS: Ventilation: Patchy ventilation defects throughout the lungs, best seen in the right lung Perfusion: No wedge shaped peripheral perfusion defects to suggest acute pulmonary embolism. IMPRESSION: No perfusion defects to suggest pulmonary embolus. Electronically Signed   By: Charlett NoseKevin  Dover M.D.   On: 08/19/2016 13:48   Dg Chest Portable 1 View  Result Date: 08/18/2016 CLINICAL DATA:  Shortness of breath. EXAM: PORTABLE CHEST 1 VIEW COMPARISON:  One-view chest x-ray 01/15/2016. FINDINGS: The heart size is upper limits of normal. This is exaggerated by low lung volumes. Moderate edema and bilateral pleural effusions are new. Bibasilar airspace disease likely reflects atelectasis. Atherosclerotic calcifications are again noted at the aortic arch. Degenerate changes are noted the shoulder and thoracic spine. The visualized soft  tissues and bony thorax are otherwise unremarkable. IMPRESSION: 1. Congestive heart failure. 2. Bibasilar airspace disease likely reflects  atelectasis. Infection is not excluded. 3. Aortic atherosclerosis. Electronically Signed   By: Marin Roberts M.D.   On: 08/18/2016 11:08  _____________   LE U/S 12.16.2017  Summary:   - Incidental findings are consistent with: Baker&'s Cyst on the   left. - No evidence of deep vein thrombosis involving the visualized   veins of the right lower extremity. - No evidence of deep vein thrombosis involving the visualized   veins of the left lower extremity. _____________   2D Echocardiogram 12.15.2017  Study Conclusions   - Left ventricle: The cavity size was normal. There was mild   concentric hypertrophy. Systolic function was normal. The   estimated ejection fraction was in the range of 50% to 55%. Wall   motion was normal; there were no regional wall motion   abnormalities. - Aortic valve: Transvalvular velocity was within the normal range.   There was no stenosis. There was no regurgitation. 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   There was moderate regurgitation. - Left atrium: The atrium was severely dilated. - Right ventricle: The cavity size was normal. Wall thickness was   normal. Systolic function was normal. - Atrial septum: No defect or patent foramen ovale was identified   by color flow Doppler. - Tricuspid valve: There was moderate regurgitation. - Pulmonary arteries: Systolic pressure was severely increased. PA   peak pressure: 83 mm Hg (S). - Pericardium, extracardiac: There was a left pleural effusion.   Ascites was noted.  ECG & Cardiac Imaging    RSR, 93, PAC, LAE.  Assessment & Plan    1.  Acute on chronic diastolic CHF/Acute respiratory failure:  Pt presented 12/15 with acute dyspnea in the absence of chest pain or any progressive symptoms @ home prior to that morning.  She briefly required BiPap.  BNP was >1500 and CXR showed interstitial edema.  She received 2 doses of IV lasix on 12/15 with 3 lbs wt loss by 12/16. Echo  showed nl EF with mod MR/TR and PASP of 83 mmHg.  She received 2 u prbcs on 12/16 and this am, wt is up 2 lbs and she has crackles and JVD on exam.  HR has been stable (bradycardic) while BPs have been variable.  Will give a dose of lasix 20 mg IV this am and suspect that she will need an oral dose daily (appears to have been on 20 daily @ home prior to admission).  Nephrology to see re: progressive CKD.  Cont current BP meds (amlodipine, clonidine, hydralazine, nitrate), though with bradycardia that sometimes drops into the 40's, we may need to consider d/c'ing clonidine.  2.  Normocytic Anemia with acute worsening: H/o normocytic anemia.  Admission H/H in line with prior recordings however she acutely dropped on 12/16  6.4/20.2.  Now s/p 2 units of prbcs.  H/H 9.6/29.9.  FOB pending.  Further w/u per IM.   3.  CAD s/p CABG/elevated troponin:  Mild troponin elevation with flat trend (0.04  0.05  0.06) in the setting of #1.  She has not had any chest pain.  Echo shows nl EF w/o wma.  Review of notes from early 2016 shows that she was advised to undergo stress testing as an outpt but subsequent admissions prevented this and it was later dropped from her recommendations.  Given frailty, co-morbidities, nl EF, and lack of c/p,  continue conservative medical therapy w/ plavix, statin, and nitrate.  If H/H stable and no bleeding/source for worsening anemia found, could also add ASA 81.  No  blocker in setting of baseline bradycardia.  4.  Hypertensive Heart Disease:  BP trend variable over the last 24 hrs - 117  162 systolic.  Follow with additional diuresis.  Already on good doses of ccb, hydralazine.  Would not push clonidine any further w/ baseline bradycardia.  Not a candidate for  blocker for the same reason.  Not a candidate for ACEI/ARB 2/2 progressive CKD.  5.  Acute on chronic stage IV kidney dzs: Creat elevated above prior baseline throughout this admission in the setting of anemia and CHF.  Creat  stable with diuresis.  Nephrology to see.  Avoiding nephrotoxic agents.  6.  Valvular Heart Disease:  Mod MR/TR.  Conservative Rx given co-morbidities.  7.  Pulmonary Arterial HTN:  PASP markedly elevated @ 83 mmHg on echo 12/15.  Echo in 01/2016 noted mildly elevated PASP.  V:Q scan was negative for PE.  ? Acutely elevated in setting of volume overload w/ underlying mod MR?  Conservative Rx.  Could consider outpt sleep eval if she'd be willing.  8.  DM II:  Per IM.  Signed, Nicolasa Duckinghristopher Berge, NP 08/20/2016, 11:13 AM  Patient seen and examined, history reviewed as above.  On examination she has basilar crackles, mild elevation of JVD, 2/6 murmur, trivial edema noted.  Agree with the impressions above.  The only thing that I would have to offer her is that she has significant anemia that could easily be contributing to her symptoms.  I would recommend a nephrology consultation and she may need to be considered for EPO if she is not iron deficiency.  A higher hemoglobin may help her to avoid some of the symptoms that she is having.  I think a lot of this is diastolic congestive heart failure and would recommend intravenous diuresis as she is complaining of dyspnea even now despite having received furosemide.   Darden PalmerW. Spencer Jamier Urbas, Jr. MD Eastside Medical CenterFACC 12:39 PM 08/20/2016

## 2016-08-21 DIAGNOSIS — I110 Hypertensive heart disease with heart failure: Secondary | ICD-10-CM

## 2016-08-21 DIAGNOSIS — I5031 Acute diastolic (congestive) heart failure: Secondary | ICD-10-CM

## 2016-08-21 DIAGNOSIS — I5033 Acute on chronic diastolic (congestive) heart failure: Secondary | ICD-10-CM

## 2016-08-21 LAB — GLUCOSE, CAPILLARY
GLUCOSE-CAPILLARY: 121 mg/dL — AB (ref 65–99)
GLUCOSE-CAPILLARY: 92 mg/dL (ref 65–99)
Glucose-Capillary: 114 mg/dL — ABNORMAL HIGH (ref 65–99)
Glucose-Capillary: 135 mg/dL — ABNORMAL HIGH (ref 65–99)

## 2016-08-21 LAB — BASIC METABOLIC PANEL
ANION GAP: 13 (ref 5–15)
BUN: 46 mg/dL — ABNORMAL HIGH (ref 6–20)
CO2: 21 mmol/L — ABNORMAL LOW (ref 22–32)
Calcium: 8.6 mg/dL — ABNORMAL LOW (ref 8.9–10.3)
Chloride: 101 mmol/L (ref 101–111)
Creatinine, Ser: 3.51 mg/dL — ABNORMAL HIGH (ref 0.44–1.00)
GFR, EST AFRICAN AMERICAN: 14 mL/min — AB (ref >60)
GFR, EST NON AFRICAN AMERICAN: 12 mL/min — AB (ref >60)
GLUCOSE: 108 mg/dL — AB (ref 65–99)
POTASSIUM: 3.5 mmol/L (ref 3.5–5.1)
Sodium: 135 mmol/L (ref 135–145)

## 2016-08-21 LAB — PROCALCITONIN: PROCALCITONIN: 0.47 ng/mL

## 2016-08-21 NOTE — Clinical Social Work Note (Signed)
Clinical Social Work Assessment  Patient Details  Name: Taylor Padilla MRN: [id number] Date of Birth: 01/15/1946  Date of referral:  08/21/16               Reason for consult:  Discharge Planning                Permission sought to share information with:  Facility Sport and exercise psychologist, Family Supports Permission granted to share information::  Yes, Verbal Permission Granted  Name::     [name] (HCPOA/Sister-in-law), [name] (Sister)  Agency::  [facility]  Relationship::     Contact Information:  [name]: [phone]: [phone]  Housing/Transportation Living arrangements for the past 2 months:  [place] of Information:  Patient, Medical Team, Other (Comment Required), Power of Forensic psychologist (Sister) Patient Interpreter Needed:  None Criminal Activity/Legal Involvement Pertinent to Current Situation/Hospitalization:  No - Comment as needed Significant Relationships:  Siblings, Other Family Members, Friend Lives with:  Facility Resident Do you feel safe going back to the place where you live?  Yes Need for family participation in patient care:  Yes (Comment)  Care giving concerns:  PT recommending SNF once medically stable for discharge. Patient is a long-term resident from [facility].   Social Worker assessment / plan:  CSW met with patient. No supports at bedside. CSW introduced role and explained that discharge planning would be discussed. Patient confirmed that she is from [facility] and the plan is for her to return. CSW asked who the best family contact is and patient replied it is her sister, [name]. CSW called [name] and confirmed the same. She stated that she has been at [facility] since October 2015. Patient's husband passed away in 2013/09/07 and patient went to two ALF's after that due to being unable to care for herself. Patient's sister stated that their sister-in-law, [name], is patient's HCPOA. CSW called  [name] and confirmed plan for return to [facility]. CSW explained that PT recommending more PT and she is agreeable as long as insurance will pay. No further concerns. CSW encouraged patient, her sister, and sister-in-law to contact CSW as needed. CSW will continue to follow patient and her family for support and facilitate discharge back to SNF once medically stable.  Employment status:  Retired Nurse, adult PT Recommendations:  [place] / Referral to community resources:  [place]  Patient/Family's Response to care:  Patient and her family agreeable to return to SNF. Patient's family supportive and involved in patient's care. Patient and her family appreciated social work intervention.  Patient/Family's Understanding of and Emotional Response to Diagnosis, Current Treatment, and Prognosis:  Patient and her family understand and are agreeable to return to SNF. Patient and her family appear happy with hospital care.  Emotional Assessment Appearance:  Appears stated age Attitude/Demeanor/Rapport:  Other (Pleasant) Affect (typically observed):  Accepting, Appropriate, Calm, Pleasant Orientation:  Oriented to Self, Oriented to Place, Oriented to Situation Alcohol / Substance use:  Never Used Psych involvement (Current and /or in the community):  No (Comment)  Discharge Needs  Concerns to be addressed:  Care Coordination Readmission within the last 30 days:  No Current discharge risk:  Cognitively Impaired, Dependent with Mobility Barriers to Discharge:  Continued Medical Work up   Candie Chroman, LCSW 08/21/2016, 10:40 AM

## 2016-08-21 NOTE — Progress Notes (Signed)
Patient Name: Taylor Padilla Date of Encounter: 08/21/2016  Primary Cardiologist: Dr. Gala RomneyBensimhon ([facility] 16)  Hospital Problem List     Active Problems:   Diabetes mellitus type II, uncontrolled (HCC)   Hypertension   CAD (coronary artery disease)   CKD (chronic kidney disease) stage 4, GFR 15-29 ml/min (HCC)   Hypothyroidism   Dyslipidemia   GERD (gastroesophageal reflux disease)   Acute respiratory failure with hypoxia (HCC)   Anemia   Acute diastolic heart failure, NYHA class 1 (HCC)   CHF exacerbation (HCC)   Hypertensive heart disease   Valvular heart disease   Type II diabetes mellitus (HCC)   PAH (pulmonary artery hypertension)   Normocytic anemia   CHF (congestive heart failure) (HCC)     Subjective   Breathing is ok today. No other complaints.   Inpatient Medications    Scheduled Meds: . sodium chloride   Intravenous Once  . amLODipine  10 mg Oral Daily  . atorvastatin  80 mg Oral QHS  . Chlorhexidine Gluconate Cloth  6 each Topical Q0600  . cloNIDine  0.1 mg Oral BID  . clopidogrel  75 mg Oral Daily  . donepezil  5 mg Oral QHS  . dorzolamide  1 drop Both Eyes BID   And  . timolol  1 drop Both Eyes BID  . furosemide  40 mg Intravenous Q12H  . heparin subcutaneous  5,000 Units Subcutaneous Q8H  . hydrALAZINE  100 mg Oral Q8H  . insulin aspart  0-9 Units Subcutaneous TID WC  . isosorbide dinitrate  20 mg Oral TID  . latanoprost  1 drop Right Eye QHS  . levofloxacin  250 mg Oral Daily  . levothyroxine  100 mcg Oral QAC breakfast  . mouth rinse  15 mL Mouth Rinse BID  . Melatonin  9 mg Oral QPM  . mirtazapine  15 mg Oral QHS  . mupirocin ointment  1 application Nasal BID  . pantoprazole  20 mg Oral Daily  . polyvinyl alcohol  1 drop Left Eye QID  . prednisoLONE acetate  1 drop Left Eye QID  . sertraline  50 mg Oral Daily  . sodium chloride flush  3 mL Intravenous Q12H  . [START ON 09/13/2016] Vitamin D (Ergocalciferol)  50,000 Units Oral Q30  days   Continuous Infusions:  PRN Meds: sodium chloride, acetaminophen, ipratropium, ondansetron (ZOFRAN) IV, sodium chloride flush, technetium TC 49M diethylenetriame-pentaacetic acid   Vital Signs    Vitals:   08/20/16 1241 08/20/16 2156 08/21/16 0019 08/21/16 0618  BP: (!) 152/58 (!) 158/53 (!) 159/48 (!) 156/59  Pulse: 85 (!) 52 (!) 50 (!) 56  Resp: 20 16  16   Temp: 98.1 F (36.7 C) 98.2 F (36.8 C)  99 F (37.2 C)  TempSrc: Oral Oral  Oral  SpO2: 98% 96% 96% 97%  Weight:    136 lb (61.7 kg)  Height:        Intake/Output Summary (Last 24 hours) at 08/21/16 1026 Last data filed at 08/21/16 0950  Gross per 24 hour  Intake             1125 ml  Output                0 ml  Net             1125 ml   Filed Weights   08/19/16 0457 08/20/16 0626 08/21/16 0618  Weight: 137 lb 1.6 oz (62.2 kg) 139 lb 1.6 oz (63.1  kg) 136 lb (61.7 kg)    Physical Exam    GEN: Pleasant older female, in no acute distress.  HEENT: Grossly normal.  Neck: Supple, + JVD, carotid bruits Cardiac: RRR, 2/6 systolic murmurs, rubs, or gallops. No clubbing, cyanosis, edema.  Radials/DP/PT 2+ and equal bilaterally.  Respiratory:  Respirations regular and unlabored, mild crackles. GI: Soft, nontender, nondistended, BS + x 4. MS: no deformity or atrophy. Skin: warm and dry, no rash. Neuro:  Strength and sensation are intact. Psych: AAOx3.  Normal affect.  Labs    CBC  Recent Labs  08/19/16 0928 08/20/16 0417  WBC 5.9 7.5  NEUTROABS  --  6.1  HGB 6.5* 9.6*  HCT 21.0* 29.9*  MCV 90.5 90.9  PLT 255 253   Basic Metabolic Panel  Recent Labs  08/20/16 0417 08/21/16 0552  NA 136 135  K 3.6 3.5  CL 102 101  CO2 20* 21*  GLUCOSE 100* 108*  BUN 44* 46*  CREATININE 3.48* 3.51*  CALCIUM 8.7* 8.6*   Liver Function Tests  Recent Labs  08/19/16 0400 08/20/16 0417  AST 15 16  ALT 10* 10*  ALKPHOS 62 69  BILITOT 0.7 1.0  PROT 5.9* 6.3*  ALBUMIN 2.6* 2.9*   No results for input(s):  LIPASE, AMYLASE in the last 72 hours. Cardiac Enzymes  Recent Labs  08/18/16 1615 08/18/16 2145 08/19/16 0400  TROPONINI 0.04* 0.05* 0.06*   BNP Invalid input(s): POCBNP D-Dimer  Recent Labs  08/18/16 1615  DDIMER 1.64*   Hemoglobin A1C  Recent Labs  08/18/16 1615  HGBA1C 5.8*   Fasting Lipid Panel No results for input(s): CHOL, HDL, LDLCALC, TRIG, CHOLHDL, LDLDIRECT in the last 72 hours. Thyroid Function Tests  Recent Labs  08/18/16 1615  TSH 1.212    Telemetry    SR - Personally Reviewed  ECG    N/A - Personally Reviewed  Radiology    Dg Chest 2 View  Result Date: 08/19/2016 CLINICAL DATA:  [facility]- pt coming from [facility] nursing facility. Initially resp distress, on NRB at 98%. Pt on CPAP on on arrival. Hx of CHF, GERD, hypertension, stroke, myocardia infarction, and pneumonia. Hx of CABG in 2003. EXAM: CHEST - 2 VIEW COMPARISON:  08/18/2016 FINDINGS: Mild interstitial edema and central below vascular congestion, improved since previous. Small pleural effusions left greater than right, improved. Mild cardiomegaly.  Atheromatous aorta. Previous CABG. IMPRESSION: 1. Improving interstitial edema and pleural effusions. Electronically Signed   By: Corlis Leak M.D.   On: 08/19/2016 13:54   Nm Pulmonary Perf And Vent  Result Date: 08/19/2016 CLINICAL DATA:  Respiratory failure EXAM: NUCLEAR MEDICINE VENTILATION - PERFUSION LUNG SCAN TECHNIQUE: Ventilation images were obtained in multiple projections using inhaled aerosol Tc-76m DTPA. Perfusion images were obtained in multiple projections after intravenous injection of Tc-63m MAA. RADIOPHARMACEUTICALS:  31.0 mCi Technetium-83m DTPA aerosol inhalation and 4.3 mCi Technetium-74m MAA IV COMPARISON:  Chest x-ray 08/18/2016 FINDINGS: Ventilation: Patchy ventilation defects throughout the lungs, best seen in the right lung Perfusion: No wedge shaped peripheral perfusion defects to suggest acute pulmonary embolism.  IMPRESSION: No perfusion defects to suggest pulmonary embolus. Electronically Signed   By: Charlett Nose M.D.   On: 08/19/2016 13:48    Cardiac Studies   TTE: 08/18/16  Study Conclusions  - Left ventricle: The cavity size was normal. There was mild   concentric hypertrophy. Systolic function was normal. The   estimated ejection fraction was in the range of 50% to 55%. Wall   motion  was normal; there were no regional wall motion   abnormalities. - Aortic valve: Transvalvular velocity was within the normal range.   There was no stenosis. There was no regurgitation. 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   There was moderate regurgitation. - Left atrium: The atrium was severely dilated. - Right ventricle: The cavity size was normal. Wall thickness was   normal. Systolic function was normal. - Atrial septum: No defect or patent foramen ovale was identified   by color flow Doppler. - Tricuspid valve: There was moderate regurgitation. - Pulmonary arteries: Systolic pressure was severely increased. PA   peak pressure: 83 mm Hg (S). - Pericardium, extracardiac: There was a left pleural effusion.   Ascites was noted.  Patient Profile     70 y/o ? with a h/o CAD s/p CABG in 2003, chronic diastolic CHF, HTN, HL, DM, CKD III-IV, glaucoma w/ legal blindness, valvular heart disease, hypothyroidism, and normocytic anemia, who was admitted 12/15 with dyspnea and found to be volume overloaded and subsequently anemic.  Assessment & Plan    1.  Acute on chronic diastolic CHF/Acute respiratory failure:  Pt presented 12/15 with acute dyspnea in the absence of chest pain or any progressive symptoms @ home prior to that morning.  She briefly required BiPap.  BNP was >1500, CXR showed interstitial edema.  She received 2 doses of IV lasix on 12/15 with 3 lbs wt loss by 12/16. Echo showed nl EF with mod MR/TR and PASP of 83 mmHg.   -- Currently on lasix IV 40mg  BID, I&O incorrect  2/2 incontinence, but weight down 140>>136 --Cont current BP meds (amlodipine, clonidine, hydralazine, nitrate)  2.  Normocytic Anemia with acute worsening: H/o normocytic anemia.  Admission H/H in line with prior recordings however she acutely dropped on 12/16  6.4/20.2.  Now s/p 2 units of prbcs.  H/H 9.6/29.9.  FOB pending.  Further w/u per IM.   3.  CAD s/p CABG/elevated troponin:  Mild troponin elevation with flat trend (0.04  0.05  0.06) in the setting of #1. No chest pain. Echo shows nl EF w/o wma.  Review of notes from early 2016 shows that she was advised to undergo stress testing as an outpt but subsequent admissions prevented this and it was later dropped from her recommendations.   --Given frailty, co-morbidities, nl EF, and lack of c/p, continue conservative medical therapy w/ plavix, statin, and nitrate.   4.  Hypertensive Heart Disease:  BP borderline controlled.  Already on good doses of ccb, hydralazine. --Would not push clonidine any further w/ baseline bradycardia, and no BB  Not a candidate for ACEI/ARB 2/2 progressive CKD.  5.  Acute on chronic stage IV kidney dzs: Creat elevated above prior baseline throughout this admission in the setting of anemia and CHF.  Cr with slight elevation today 3.4>>3.5 with diuresis.  6.  Valvular Heart Disease:  Mod MR/TR.  Conservative Rx given co-morbidities.  7.  Pulmonary Arterial HTN:  PASP markedly elevated @ 83 mmHg on echo 12/15.  Echo in 01/2016 noted mildly elevated PASP.  No PE  -- Conservative Rx.  Could consider outpt sleep eval if she'd be willing.  8.  DM II:  Per IM.  Signed, Laverda Page, NP  08/21/2016, 10:26 AM   As above, patient seen and examined. Patient states she remains mildly dyspneic. No chest pain. We will continue Lasix at present dose. Follow renal function closely. Nephrology has seen and feels she is not  a dialysis candidate.

## 2016-08-21 NOTE — Progress Notes (Signed)
Patient with no complaints or concerns during 7pm - 7am shift.  Briant Angelillo, RN 

## 2016-08-21 NOTE — Progress Notes (Signed)
PROGRESS NOTE  Taylor [name] ZOX:[id number] DOB: 1946/01/28 DOA: 08/18/2016 PCP: No PCP Per Patient  Brief History:  70 y.o.femalewith extensive medical history including diastolic CHF, hypertension, CAD status post MI, diabetes, history of CVA, presenting to the emergency department with sudden onset of shortness of breath since this morning. The patient reported orthopnea, but not paroxysmal nocturnal dyspnea. She denies any chest pain or palpitations. She denies any history of DVT or PE. She denies any recent long distance trips, or hormonal replacement she denies any changes in her medications, and she is compliant with them. Last doses of her medications were given today. She denies any fever, however she reports chills today, feeling very cold. She is noted aware of any sick contacts, but she does live in a nursing home. She is very sedentary. She denies any nausea, vomiting, abdominal pain, appetite changes, dysuria, hematuria, lower extremity swelling. She denies any headaches, or confusion. She is legally blind. On transit, she was on NRB, later placed on CPAP. She was in 6 L, and now she is on 2.5 L of nasal cannula. The patient is not on oxygen at home. On transit, the patient received albuterol, nitro, and Atrovent nebulizers, with improvement of her symptoms   Assessment/Plan: Acute respiratory failure with hypoxia --due to CHF -Initial CXR with CHF, and bibasilar airspace disease, likely atelectasis versus infection Lactic Acid normal 08/19/16--ECHO: 50-55%, no WMA, PASP 83, mod TR, MR -continue lasix -NEG 4 lbs since admission--140-->136 -I/O not accurate -D Dimer positive but V/Q scan showed no sign of PE -Leg dopplers--negative for DVT 08/19/16--CXR shows improvement in infiltrates -Blood cultures--neg to date -Influenza A negative -appreciate cardiology follow up -check procalcitonin  Anemia of chronic disease -Hemoglobin on admission 8.5 .  -Baseline  Hb in the 8-9 No bleeding issues reported  -CBC 12/16 showed acute drop in H/H -Transfused 2 units 08/19/16- -Hemoccult ordered but no stools at this time -CBC in am  Acute on Chronic kidney disease stage 4 -baseline creatinine 2.2-2.4  -serum creatinine peaked 3.51 -nephrology consult appreciated--not HD candidate  CAD s/p CABG/elevated troponin:  -Mild troponin elevation with flat trend (0.04 0.05 0.06) in the setting of CHF.  -no chest pain  HTN with HTN Heart Disease -controlled -continue amlodipine, clonidine, hydralazine, isordil  Hyperlipidemia Continue home statins  Hypothyroidism: -Continue home Synthroid TSH WNL  Type II Diabetes  Hgb A1C of 5.8 Hold home oral diabetic medications.  SSI CBG's well controlled at this time  GERD,no acute symptoms: Continue PPI   CAD,s/p CABG 2003  EKG and Troponin as above patient. Continue meds  Deconditioning OT/PT-->SNF  Depression -D/c Paxil -Continue Zoloft -continue remeron       Disposition Plan:   Home in 1-2 days  Family Communication:   Sister update on phone 12/18  Consultants:  Cardiology/nephrology  Code Status:   DNR  DVT Prophylaxis:  [place] Heparin   Procedures: As Listed in Progress Note Above  Antibiotics: None    Subjective: Patient denies fevers, chills, headache, chest pain, dyspnea, nausea, vomiting, diarrhea, abdominal pain, dysuria, hematuria, hematochezia, and melena. Overall she is breathing better.  Objective: Vitals:   08/20/16 1241 08/20/16 2156 08/21/16 0019 08/21/16 0618  BP: (!) 152/58 (!) 158/53 (!) 159/48 (!) 156/59  Pulse: 85 (!) 52 (!) 50 (!) 56  Resp: 20 16  16   Temp: 98.1 F (36.7 C) 98.2 F (36.8 C)  99 F (37.2 C)  TempSrc: Oral Oral  Oral  SpO2: 98% 96% 96% 97%  Weight:    61.7 kg (136 lb)  Height:        Intake/Output Summary (Last 24 hours) at 08/21/16 1408 Last data filed at 08/21/16 1311  Gross per 24 hour  Intake               885 ml  Output                0 ml  Net              885 ml   Weight change: -1.406 kg (-3 lb 1.6 oz) Exam:   General:  Pt is alert, follows commands appropriately, not in acute distress  HEENT: No icterus, No thrush, No neck mass, [place]/AT  Cardiovascular: RRR, S1/S2, no rubs, no gallops  Respiratory: bibasilar crackles, no wheeze  Abdomen: Soft/+BS, non tender, non distended, no guarding  Extremities: 1+ LE  edema, No lymphangitis, No petechiae, No rashes, no synovitis   Data Reviewed: I have personally reviewed following labs and imaging studies Basic Metabolic Panel:  Recent Labs Lab 08/18/16 1025 08/18/16 1035 08/19/16 0400 08/20/16 0417 08/21/16 0552  NA 139 139 136 136 135  K 3.5 3.5 3.7 3.6 3.5  CL 106 106 103 102 101  CO2 19*  --  21* 20* 21*  GLUCOSE 255* 253* 123* 100* 108*  BUN 40* 38* 44* 44* 46*  CREATININE 3.37* 3.60* 3.33* 3.48* 3.51*  CALCIUM 9.1  --  8.3* 8.7* 8.6*   Liver Function Tests:  Recent Labs Lab 08/18/16 1025 08/19/16 0400 08/20/16 0417  AST 18 15 16   ALT 9* 10* 10*  ALKPHOS 81 62 69  BILITOT 0.8 0.7 1.0  PROT 7.1 5.9* 6.3*  ALBUMIN 3.4* 2.6* 2.9*   No results for input(s): LIPASE, AMYLASE in the last 168 hours. No results for input(s): AMMONIA in the last 168 hours. Coagulation Profile: No results for input(s): INR, PROTIME in the last 168 hours. CBC:  Recent Labs Lab 08/18/16 1025 08/18/16 1035 08/19/16 0555 08/19/16 0928 08/20/16 0417  WBC 11.0*  --  6.1 5.9 7.5  NEUTROABS 9.4*  --   --   --  6.1  HGB 8.2* 8.5* 6.4* 6.5* 9.6*  HCT 27.0* 25.0* 20.2* 21.0* 29.9*  MCV 92.8  --  90.6 90.5 90.9  PLT 401*  --  269 255 253   Cardiac Enzymes:  Recent Labs Lab 08/18/16 1615 08/18/16 2145 08/19/16 0400  TROPONINI 0.04* 0.05* 0.06*   BNP: Invalid input(s): POCBNP CBG:  Recent Labs Lab 08/20/16 1129 08/20/16 1636 08/20/16 2155 08/21/16 0623 08/21/16 1303  GLUCAP 114* 140* 105* 114* 135*    HbA1C:  Recent Labs  08/18/16 1615  HGBA1C 5.8*   Urine analysis:    Component Value Date/Time   COLORURINE YELLOW 01/15/2016 2112   APPEARANCEUR TURBID (A) 01/15/2016 2112   LABSPEC 1.015 01/15/2016 2112   PHURINE 5.0 01/15/2016 2112   GLUCOSEU 250 (A) 01/15/2016 2112   HGBUR LARGE (A) 01/15/2016 2112   BILIRUBINUR NEGATIVE 01/15/2016 2112   KETONESUR NEGATIVE 01/15/2016 2112   PROTEINUR 100 (A) 01/15/2016 2112   UROBILINOGEN 0.2 07/10/2015 0401   NITRITE NEGATIVE 01/15/2016 2112   LEUKOCYTESUR LARGE (A) 01/15/2016 2112   Sepsis Labs: @LABRCNTIP (procalcitonin:4,lacticidven:4) ) Recent Results (from the past 240 hour(s))  MRSA PCR Screening     Status: Abnormal   Collection Time: 08/18/16  2:36 PM  Result Value Ref Range Status   MRSA by PCR POSITIVE (  A) NEGATIVE Final    Comment:        The GeneXpert MRSA Assay (FDA approved for NASAL specimens only), is one component of a comprehensive MRSA colonization surveillance program. It is not intended to diagnose MRSA infection nor to guide or monitor treatment for MRSA infections. RESULT CALLED TO, READ BACK BY AND VERIFIED WITH: Yevonne Aline RN 16:20 08/18/16 ([email or web address])   Culture, blood (Routine X 2) w Reflex to ID Panel     Status: None (Preliminary result)   Collection Time: 08/18/16  4:15 PM  Result Value Ref Range Status   Specimen Description BLOOD LEFT ANTECUBITAL  Final   Special Requests IN PEDIATRIC BOTTLE 2CC  Final   Culture NO GROWTH 2 DAYS  Final   Report Status PENDING  Incomplete  Culture, blood (Routine X 2) w Reflex to ID Panel     Status: None (Preliminary result)   Collection Time: 08/18/16  4:21 PM  Result Value Ref Range Status   Specimen Description BLOOD RIGHT HAND  Final   Special Requests BOTTLES DRAWN AEROBIC ONLY 5CC  Final   Culture NO GROWTH 2 DAYS  Final   Report Status PENDING  Incomplete     Scheduled Meds: . sodium chloride   Intravenous Once  . amLODipine  10 mg Oral Daily  .  atorvastatin  80 mg Oral QHS  . Chlorhexidine Gluconate Cloth  6 each Topical Q0600  . cloNIDine  0.1 mg Oral BID  . clopidogrel  75 mg Oral Daily  . donepezil  5 mg Oral QHS  . dorzolamide  1 drop Both Eyes BID   And  . timolol  1 drop Both Eyes BID  . furosemide  40 mg Intravenous Q12H  . heparin subcutaneous  5,000 Units Subcutaneous Q8H  . hydrALAZINE  100 mg Oral Q8H  . insulin aspart  0-9 Units Subcutaneous TID WC  . isosorbide dinitrate  20 mg Oral TID  . latanoprost  1 drop Right Eye QHS  . levofloxacin  250 mg Oral Daily  . levothyroxine  100 mcg Oral QAC breakfast  . mouth rinse  15 mL Mouth Rinse BID  . Melatonin  9 mg Oral QPM  . mirtazapine  15 mg Oral QHS  . mupirocin ointment  1 application Nasal BID  . pantoprazole  20 mg Oral Daily  . polyvinyl alcohol  1 drop Left Eye QID  . prednisoLONE acetate  1 drop Left Eye QID  . sertraline  50 mg Oral Daily  . sodium chloride flush  3 mL Intravenous Q12H  . [START ON 09/13/2016] Vitamin D (Ergocalciferol)  50,000 Units Oral Q30 days   Continuous Infusions:  Procedures/Studies: Dg Chest 2 View  Result Date: 08/19/2016 CLINICAL DATA:  [facility]- pt coming from [facility]. Initially resp distress, on NRB at 98%. Pt on CPAP on on arrival. Hx of CHF, GERD, hypertension, stroke, myocardia infarction, and pneumonia. Hx of CABG in 2003. EXAM: CHEST - 2 VIEW COMPARISON:  08/18/2016 FINDINGS: Mild interstitial edema and central below vascular congestion, improved since previous. Small pleural effusions left greater than right, improved. Mild cardiomegaly.  Atheromatous aorta. Previous CABG. IMPRESSION: 1. Improving interstitial edema and pleural effusions. Electronically Signed   By: Corlis Leak M.D.   On: 08/19/2016 13:54   Nm Pulmonary Perf And Vent  Result Date: 08/19/2016 CLINICAL DATA:  Respiratory failure EXAM: NUCLEAR MEDICINE VENTILATION - PERFUSION LUNG SCAN TECHNIQUE: Ventilation images were obtained in  multiple projections using inhaled aerosol Tc-73m  DTPA. Perfusion images were obtained in multiple projections after intravenous injection of Tc-31m MAA. RADIOPHARMACEUTICALS:  31.0 mCi Technetium-96m DTPA aerosol inhalation and 4.3 mCi Technetium-79m MAA IV COMPARISON:  Chest x-ray 08/18/2016 FINDINGS: Ventilation: Patchy ventilation defects throughout the lungs, best seen in the right lung Perfusion: No wedge shaped peripheral perfusion defects to suggest acute pulmonary embolism. IMPRESSION: No perfusion defects to suggest pulmonary embolus. Electronically Signed   By: Charlett Nose M.D.   On: 08/19/2016 13:48   Dg Chest Portable 1 View  Result Date: 08/18/2016 CLINICAL DATA:  Shortness of breath. EXAM: PORTABLE CHEST 1 VIEW COMPARISON:  One-view chest x-ray 01/15/2016. FINDINGS: The heart size is upper limits of normal. This is exaggerated by low lung volumes. Moderate edema and bilateral pleural effusions are new. Bibasilar airspace disease likely reflects atelectasis. Atherosclerotic calcifications are again noted at the aortic arch. Degenerate changes are noted the shoulder and thoracic spine. The visualized soft tissues and bony thorax are otherwise unremarkable. IMPRESSION: 1. Congestive heart failure. 2. Bibasilar airspace disease likely reflects atelectasis. Infection is not excluded. 3. Aortic atherosclerosis. Electronically Signed   By: Marin Roberts M.D.   On: 08/18/2016 11:08    Tayonna Bacha, DO  [facility] Pager [phone]  If 7PM-7AM, please contact night-coverage [email or web address] Password [email or web address] 08/21/2016, 2:08 PM   LOS: 1 day

## 2016-08-21 NOTE — [place] FL2 (Signed)
[place] MEDICAID FL2 LEVEL OF CARE SCREENING TOOL     IDENTIFICATION  Patient Name: Taylor Padilla Birthdate: 1946/08/25 Sex: female Admission Date (Current Location): 08/18/2016  [facility] [place]   [facility]ility and [facility]  [facility], [address], [place], [place] [address]      Provider Number: [id number]  Attending Physician Name and Address:  Catarina Hartshornavid Tat, MD  Relative Name and Phone Number:       Current Level of Care: Hospital Recommended Level of Care: Skilled Nursing Facility Prior Approval Number:    Date Approved/Denied:   PASRR Number: [id number]  Discharge Plan: SNF    Current Diagnoses: Patient Active Problem List   Diagnosis Date Noted  . CHF (congestive heart failure) (HCC) 08/20/2016  . Hypertensive heart disease   . Valvular heart disease   . Type II diabetes mellitus (HCC)   . PAH (pulmonary artery hypertension)   . Normocytic anemia   . CHF exacerbation (HCC) 08/18/2016  . Acute encephalopathy 01/18/2016  . Acute kidney injury (HCC) 01/15/2016  . Anemia due to other cause 03/07/2015  . UTI (urinary tract infection) 03/04/2015  . Right acetabular fracture (HCC) 03/02/2015  . Chronic diastolic heart failure (HCC) 12/31/2014  . AKI (acute kidney injury) (HCC)   . Staphylococcus aureus bacteremia 11/23/2014  . Fever   . Acute diastolic heart failure, NYHA class 1 (HCC) 11/19/2014  . Acute on chronic diastolic heart failure (HCC)   . Coronary artery disease involving native coronary artery of native heart without angina pectoris   . Essential hypertension   . CKD (chronic kidney disease) stage 4, GFR 15-29 ml/min (HCC) 10/07/2014  . Hypothyroidism 10/07/2014  . Dyslipidemia 10/07/2014  . GERD (gastroesophageal reflux disease) 10/07/2014  . Acute respiratory failure with hypoxia (HCC) 10/07/2014  . Sinus bradycardia 10/07/2014  . Anemia 10/07/2014  . Diabetes mellitus type II, uncontrolled (HCC)    . Hypertension   . CAD (coronary artery disease)     Orientation RESPIRATION BLADDER Height & Weight     Self, Situation, Place  O2 Incontinent Weight: 136 lb (61.7 kg) Height:  5\' 1"  (154.9 cm)  BEHAVIORAL SYMPTOMS/MOOD NEUROLOGICAL BOWEL NUTRITION STATUS   (None)  (None) Continent Diet (Renal/carb modified with fluid restriction)  AMBULATORY STATUS COMMUNICATION OF NEEDS Skin   Extensive Assist Verbally Other (Comment) (Rash)                       Personal Care Assistance Level of Assistance  Bathing, Feeding, Dressing Bathing Assistance: Limited assistance Feeding assistance: Limited assistance Dressing Assistance: Limited assistance     Functional Limitations Info  Sight, Hearing, Speech Sight Info: Impaired Hearing Info: Adequate Speech Info: Adequate    SPECIAL CARE FACTORS FREQUENCY  PT (By licensed PT), OT (By licensed OT), Blood pressure, Diabetic urine testing     PT Frequency: 5 x week OT Frequency: 5 x week            Contractures Contractures Info: Not present    Additional Factors Info  Code Status, Allergies, Isolation Precautions Code Status Info: DNR Allergies Info: Epinephrine           Current Medications (08/21/2016):  This is the current hospital active medication list Current Facility-Administered Medications  Medication Dose Route Frequency Provider Last Rate Last Dose  . 0.9 %  sodium chloride infusion  250 mL Intravenous PRN Marcos EkeSara E Wertman, PA-C      . 0.9 %  sodium chloride infusion   Intravenous Once Filbert Schilder, MD      . acetaminophen (TYLENOL) tablet 650 mg  650 mg Oral Q4H PRN Marcos Eke, PA-C      . amLODipine (NORVASC) tablet 10 mg  10 mg Oral Daily Marcos Eke, PA-C   10 mg at 08/21/16 0948  . atorvastatin (LIPITOR) tablet 80 mg  80 mg Oral QHS Marcos Eke, PA-C   80 mg at 08/20/16 2200  . Chlorhexidine Gluconate Cloth 2 % PADS 6 each  6 each Topical Q0600 Alexis Hugelmeyer, DO   6 each at 08/21/16  0600  . cloNIDine (CATAPRES) tablet 0.1 mg  0.1 mg Oral BID Marcos Eke, PA-C   0.1 mg at 08/21/16 1610  . clopidogrel (PLAVIX) tablet 75 mg  75 mg Oral Daily Marcos Eke, PA-C   75 mg at 08/21/16 9604  . donepezil (ARICEPT) tablet 5 mg  5 mg Oral QHS Filbert Schilder, MD   5 mg at 08/20/16 2200  . dorzolamide (TRUSOPT) 2 % ophthalmic solution 1 drop  1 drop Both Eyes BID Joaquim Nam, RPH   1 drop at 08/21/16 5409   And  . timolol (TIMOPTIC) 0.5 % ophthalmic solution 1 drop  1 drop Both Eyes BID Joaquim Nam, RPH   1 drop at 08/21/16 0948  . furosemide (LASIX) injection 40 mg  40 mg Intravenous Q12H Othella Boyer, MD   40 mg at 08/21/16 0021  . heparin injection 5,000 Units  5,000 Units Subcutaneous Q8H Darl Householder Masters, [facility]   5,000 Units at 08/21/16 [phone]  . hydrALAZINE (APRESOLINE) tablet 100 mg  100 mg Oral Q8H Marcos Eke, PA-C   100 mg at 08/21/16 1478  . insulin aspart (novoLOG) injection 0-9 Units  0-9 Units Subcutaneous TID WC Marcos Eke, PA-C   1 Units at 08/20/16 1706  . ipratropium (ATROVENT) nebulizer solution 0.5 mg  0.5 mg Nebulization Q4H PRN Joaquim Nam, RPH      . isosorbide dinitrate (ISORDIL) tablet 20 mg  20 mg Oral TID Marcos Eke, PA-C   20 mg at 08/21/16 0948  . latanoprost (XALATAN) 0.005 % ophthalmic solution 1 drop  1 drop Right Eye QHS Marcos Eke, PA-C   1 drop at 08/20/16 2210  . levofloxacin (LEVAQUIN) tablet 250 mg  250 mg Oral Daily Marcos Eke, PA-C   250 mg at 08/21/16 2956  . levothyroxine (SYNTHROID, LEVOTHROID) tablet 100 mcg  100 mcg Oral QAC breakfast Marcos Eke, PA-C   100 mcg at 08/21/16 2130  . MEDLINE mouth rinse  15 mL Mouth Rinse BID Filbert Schilder, MD   15 mL at 08/21/16 0949  . Melatonin TABS 9 mg  9 mg Oral QPM Marcos Eke, PA-C   9 mg at 08/20/16 2201  . mirtazapine (REMERON) tablet 15 mg  15 mg Oral QHS Filbert Schilder, MD   15 mg at 08/20/16 2200  . mupirocin ointment (BACTROBAN) 2 % 1 application   1 application Nasal BID Alexis Hugelmeyer, DO   1 application at 08/21/16 0949  . ondansetron (ZOFRAN) injection 4 mg  4 mg Intravenous Q6H PRN Marcos Eke, PA-C      . pantoprazole (PROTONIX) EC tablet 20 mg  20 mg Oral Daily Marcos Eke, PA-C   20 mg at 08/21/16 0948  . polyvinyl alcohol (LIQUIFILM TEARS) 1.4 % ophthalmic solution 1 drop  1 drop  Left Eye QID Marcos EkeSara E Wertman, PA-C   1 drop at 08/21/16 [phone]  . prednisoLONE acetate (PRED FORTE) 1 % ophthalmic suspension 1 drop  1 drop Left Eye QID Marcos EkeSara E Wertman, PA-C   1 drop at 08/21/16 [id number]  . sertraline (ZOLOFT) tablet 50 mg  50 mg Oral Daily Filbert SchilderAlexandria U Kadolph, MD   50 mg at 08/21/16 0948  . sodium chloride flush (NS) 0.9 % injection 3 mL  3 mL Intravenous Q12H Marcos EkeSara E Wertman, PA-C   3 mL at 08/21/16 0950  . sodium chloride flush (NS) 0.9 % injection 3 mL  3 mL Intravenous PRN Marcos EkeSara E Wertman, PA-C      . technetium TC 76M diethylenetriame-pentaacetic acid (DTPA) injection 31 millicurie  31 millicurie Inhalation Once PRN Myles RosenthalJohn Stahl, MD      . Melene Muller[START ON 09/13/2016] Vitamin D (Ergocalciferol) (DRISDOL) capsule 50,000 Units  50,000 Units Oral Q30 days Filbert SchilderAlexandria U Kadolph, MD         Discharge Medications: Please see discharge summary for a list of discharge medications.  Relevant Imaging Results:  Relevant Lab Results:   Additional Information SS#: [id number]  Margarito LinerSarah C Alzada Brazee, LCSW

## 2016-08-22 DIAGNOSIS — E785 Hyperlipidemia, unspecified: Secondary | ICD-10-CM

## 2016-08-22 DIAGNOSIS — N179 Acute kidney failure, unspecified: Secondary | ICD-10-CM

## 2016-08-22 LAB — BASIC METABOLIC PANEL
Anion gap: 10 (ref 5–15)
BUN: 44 mg/dL — AB (ref 6–20)
CALCIUM: 8.2 mg/dL — AB (ref 8.9–10.3)
CO2: 24 mmol/L (ref 22–32)
CREATININE: 3.58 mg/dL — AB (ref 0.44–1.00)
Chloride: 102 mmol/L (ref 101–111)
GFR calc non Af Amer: 12 mL/min — ABNORMAL LOW (ref >60)
GFR, EST AFRICAN AMERICAN: 14 mL/min — AB (ref >60)
Glucose, Bld: 126 mg/dL — ABNORMAL HIGH (ref 65–99)
Potassium: 3 mmol/L — ABNORMAL LOW (ref 3.5–5.1)
SODIUM: 136 mmol/L (ref 135–145)

## 2016-08-22 LAB — GLUCOSE, CAPILLARY
GLUCOSE-CAPILLARY: 126 mg/dL — AB (ref 65–99)
GLUCOSE-CAPILLARY: 106 mg/dL — AB (ref 65–99)
Glucose-Capillary: 121 mg/dL — ABNORMAL HIGH (ref 65–99)
Glucose-Capillary: 83 mg/dL (ref 65–99)

## 2016-08-22 MED ORDER — FUROSEMIDE 40 MG PO TABS
40.0000 mg | ORAL_TABLET | Freq: Two times a day (BID) | ORAL | Status: DC
  Administered 2016-08-22 – 2016-08-24 (×4): 40 mg via ORAL
  Filled 2016-08-22 (×4): qty 1

## 2016-08-22 MED ORDER — CLONIDINE HCL 0.2 MG PO TABS
0.2000 mg | ORAL_TABLET | Freq: Two times a day (BID) | ORAL | Status: DC
  Administered 2016-08-22 – 2016-08-23 (×2): 0.2 mg via ORAL
  Filled 2016-08-22 (×2): qty 1

## 2016-08-22 MED ORDER — POTASSIUM CHLORIDE CRYS ER 20 MEQ PO TBCR
40.0000 meq | EXTENDED_RELEASE_TABLET | Freq: Once | ORAL | Status: AC
Start: 2016-08-22 — End: 2016-08-22
  Administered 2016-08-22: 40 meq via ORAL
  Filled 2016-08-22: qty 2

## 2016-08-22 NOTE — Progress Notes (Signed)
Patient Name: Taylor Padilla Date of Encounter: 08/22/2016  Primary Cardiologist: Dr. Gala RomneyBensimhon (CHF clinic 16)  Hospital Problem List     Active Problems:   Diabetes mellitus type II, uncontrolled (HCC)   Hypertension   CAD (coronary artery disease)   CKD (chronic kidney disease) stage 4, GFR 15-29 ml/min (HCC)   Hypothyroidism   Dyslipidemia   GERD (gastroesophageal reflux disease)   Acute respiratory failure with hypoxia (HCC)   Anemia   Acute diastolic heart failure, NYHA class 1 (HCC)   CHF exacerbation (HCC)   Hypertensive heart disease   Valvular heart disease   Type II diabetes mellitus (HCC)   PAH (pulmonary artery hypertension)   Normocytic anemia   CHF (congestive heart failure) (HCC)   Acute on chronic diastolic congestive heart failure (HCC)   Acute respiratory failure (HCC)   Acute renal failure superimposed on stage 4 chronic kidney disease (HCC)     Subjective   Complains of nausea; mild dyspnea; no chest pain  Inpatient Medications    Scheduled Meds: . sodium chloride   Intravenous Once  . amLODipine  10 mg Oral Daily  . atorvastatin  80 mg Oral QHS  . Chlorhexidine Gluconate Cloth  6 each Topical Q0600  . cloNIDine  0.1 mg Oral BID  . clopidogrel  75 mg Oral Daily  . donepezil  5 mg Oral QHS  . dorzolamide  1 drop Both Eyes BID   And  . timolol  1 drop Both Eyes BID  . furosemide  40 mg Intravenous Q12H  . heparin subcutaneous  5,000 Units Subcutaneous Q8H  . hydrALAZINE  100 mg Oral Q8H  . insulin aspart  0-9 Units Subcutaneous TID WC  . isosorbide dinitrate  20 mg Oral TID  . latanoprost  1 drop Right Eye QHS  . levofloxacin  250 mg Oral Daily  . levothyroxine  100 mcg Oral QAC breakfast  . mouth rinse  15 mL Mouth Rinse BID  . Melatonin  9 mg Oral QPM  . mirtazapine  15 mg Oral QHS  . mupirocin ointment  1 application Nasal BID  . pantoprazole  20 mg Oral Daily  . polyvinyl alcohol  1 drop Left Eye QID  . prednisoLONE acetate  1  drop Left Eye QID  . sertraline  50 mg Oral Daily  . sodium chloride flush  3 mL Intravenous Q12H  . [START ON 09/13/2016] Vitamin D (Ergocalciferol)  50,000 Units Oral Q30 days   Continuous Infusions:  PRN Meds: sodium chloride, acetaminophen, ipratropium, ondansetron (ZOFRAN) IV, sodium chloride flush, technetium TC 76M diethylenetriame-pentaacetic acid   Vital Signs    Vitals:   08/21/16 1200 08/21/16 2112 08/21/16 2233 08/22/16 0529  BP: (!) 155/58 (!) 155/60 (!) 162/57 (!) 167/55  Pulse: (!) 52 (!) 51  (!) 47  Resp: 18 18  18   Temp: 98.6 F (37 C) 99 F (37.2 C)  97 F (36.1 C)  TempSrc: Oral Oral  Oral  SpO2: 98% 97%  98%  Weight:    138 lb (62.6 kg)  Height:        Intake/Output Summary (Last 24 hours) at 08/22/16 1127 Last data filed at 08/22/16 0600  Gross per 24 hour  Intake              763 ml  Output                0 ml  Net  763 ml   Filed Weights   08/20/16 0626 08/21/16 0618 08/22/16 0529  Weight: 139 lb 1.6 oz (63.1 kg) 136 lb (61.7 kg) 138 lb (62.6 kg)    Physical Exam    GEN: Pleasant older female, in no acute distress.  HEENT: Grossly normal.  Neck: Supple Cardiac: RRR Respiratory:  CTA anteriorly GI: Soft, nontender, nondistended MS: no deformity or atrophy. Skin: warm and dry, no rash. Neuro:  Strength and sensation are intact. Psych: AAOx3.  Normal affect.  Labs    CBC  Recent Labs  08/20/16 0417  WBC 7.5  NEUTROABS 6.1  HGB 9.6*  HCT 29.9*  MCV 90.9  PLT 253   Basic Metabolic Panel  Recent Labs  08/21/16 0552 08/22/16 0332  NA 135 136  K 3.5 3.0*  CL 101 102  CO2 21* 24  GLUCOSE 108* 126*  BUN 46* 44*  CREATININE 3.51* 3.58*  CALCIUM 8.6* 8.2*   Liver Function Tests  Recent Labs  08/20/16 0417  AST 16  ALT 10*  ALKPHOS 69  BILITOT 1.0  PROT 6.3*  ALBUMIN 2.9*     Telemetry    Sinus bradycardia - Personally Reviewed   Cardiac Studies   TTE: 08/18/16  Study Conclusions  - Left  ventricle: The cavity size was normal. There was mild   concentric hypertrophy. Systolic function was normal. The   estimated ejection fraction was in the range of 50% to 55%. Wall   motion was normal; there were no regional wall motion   abnormalities. - Aortic valve: Transvalvular velocity was within the normal range.   There was no stenosis. There was no regurgitation. 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   There was moderate regurgitation. - Left atrium: The atrium was severely dilated. - Right ventricle: The cavity size was normal. Wall thickness was   normal. Systolic function was normal. - Atrial septum: No defect or patent foramen ovale was identified   by color flow Doppler. - Tricuspid valve: There was moderate regurgitation. - Pulmonary arteries: Systolic pressure was severely increased. PA   peak pressure: 83 mm Hg (S). - Pericardium, extracardiac: There was a left pleural effusion.   Ascites was noted.  Patient Profile     70 y/o ? with a h/o CAD s/p CABG in 2003, chronic diastolic CHF, HTN, HL, DM, CKD III-IV, glaucoma w/ legal blindness, valvular heart disease, hypothyroidism, and normocytic anemia, who was admitted 12/15 with dyspnea and found to be volume overloaded and subsequently anemic.  Assessment & Plan    1.  Acute on chronic diastolic CHF/Acute respiratory failure:  Pt improving on exam; I/O not accurate due to incontinence; change lasix to 40 po BID and follow.  2.  Normocytic Anemia with acute worsening: Further w/u per IM.   3.  Hypertension:  BP borderline controlled. Continue present meds and follow.  4.  Acute on chronic stage IV kidney dzs: not a dialysis candidate per nephrology; follow renal function closely.  5.  DM II:  Per IM.  Signed, Olga MillersBrian Marq Rebello, MD  08/22/2016, 11:27 AM

## 2016-08-22 NOTE — Progress Notes (Signed)
Patient is alert and forgetful, can be demanding, patient states that she has trouble breathing with lying flat, on 2L  02, desat on room air to 87 in bed, has nasal congestion and and dry cough.

## 2016-08-22 NOTE — Progress Notes (Signed)
PROGRESS NOTE  Taylor Padilla ZOX:[id number] DOB: Apr 02, 1946 DOA: 08/18/2016 PCP: No PCP Per Patient Brief History:  70 y.o.femalewith extensive medical history including diastolic CHF, hypertension, CAD status post MI, diabetes, history of CVA, presenting to the emergency department with sudden onset of shortness of breath since this morning. The patient reported orthopnea, but not paroxysmal nocturnal dyspnea. She denies any chest pain or palpitations. She denies any history of DVT or PE. She denies any recent long distance trips, or hormonal replacement she denies any changes in her medications, and she is compliant with them. Last doses of her medications were given today. She denies any fever, however she reports chills today, feeling very cold. She is noted aware of any sick contacts, but she does live in a nursing home. She is very sedentary. She denies any nausea, vomiting, abdominal pain, appetite changes, dysuria, hematuria, lower extremity swelling. She denies any headaches, or confusion. She is legally blind. On transit, she was on NRB, later placed on CPAP. She was in 6 L, and now she is on 2.5 L of nasal cannula. The patient is not on oxygen at home. On transit, the patient received albuterol, nitro, and Atrovent nebulizers, with improvement of her symptoms   Assessment/Plan: Acute respiratory failure with hypoxia --due to CHF -Initial CXR with CHF, and bibasilar airspace disease, likely atelectasis versus infection Lactic Acid normal 08/19/16--ECHO: 50-55%, no WMA, PASP 83, mod TR, MR -continue lasix -NEG 4 lbs since admission--140-->136 -I/O not accurate -D Dimer positive but V/Q scan showed no sign of PE -Leg dopplers--negative for DVT 08/19/16--CXR shows improvement in infiltrates -Blood cultures--neg to date -Influenza A negative -appreciate cardiology follow up-->switched to po lasix 12/19 -check procalcitonin--0.47-->d/c levoflox--had 5 days  Acute on  chronic diastolic CHF -appreciate cardiology follow up-->switched to po lasix 12/19 -08/19/16--ECHO: 50-55%, no WMA, PASP 83, mod TR, MR -as above  Anemia of chronic disease -Hemoglobin on admission 8.5 .  -Baseline Hb in the 8-9 No bleeding issues reported  -CBC 12/16 showed acute drop in H/H -Transfused2 units 08/19/16- -Hemoccult ordered but no stools at this time -CBC in am  Acute on Chronic kidney disease stage 4 -baseline creatinine 2.2-2.4  -serum creatinine peaked 3.58 -nephrology consult appreciated--not HD candidate  CAD s/p CABG/elevated troponin:  -Mild troponin elevation with flat trend (0.04 0.05 0.06) in the setting of CHF.  -no chest pain -Continue Plavix 75 mg daily  HTN with HTN Heart Disease -controlled -continue amlodipine, clonidine, hydralazine, isordil -Increase clonidine to 0.2 mg twice a day  Hyperlipidemia Continue home statins  Hypothyroidism: -Continue home Synthroid TSH WNL  Type II Diabetes  Hgb A1C of 5.8 Hold home oral diabetic medications.  SSI CBG's well controlled at this time  GERD,no acute symptoms: Continue PPI   CAD,s/p CABG 2003  EKG and Troponin as above patient. Continue meds  Deconditioning OT/PT-->SNF  Depression -D/c Paxil -Continue Zoloft -continue remeron    Disposition Plan:   Home when cleared by cardiology Family Communication:   Sister update on phone 12/18  Consultants:  Cardiology/nephrology  Code Status:   DNR  DVT Prophylaxis:  [place] Heparin   Procedures: As Listed in Progress Note Above  Antibiotics: None    Subjective: Overall, the patient is breathing better but she still has some occasional dyspnea on exertion. Denies any headache, chest pain, nausea, vomiting, diarrhea, chest pain. No abdominal pain or dysuria or hematuria  Objective: Vitals:   08/21/16 2112 08/21/16 2233  08/22/16 0529 08/22/16 1200  BP: (!) 155/60 (!) 162/57 (!) 167/55 (!)  159/59  Pulse: (!) 51  (!) 47 (!) 50  Resp: 18  18 16   Temp: 99 F (37.2 C)  97 F (36.1 C) 98 F (36.7 C)  TempSrc: Oral  Oral Oral  SpO2: 97%  98% 95%  Weight:   62.6 kg (138 lb)   Height:        Intake/Output Summary (Last 24 hours) at 08/22/16 1704 Last data filed at 08/22/16 0600  Gross per 24 hour  Intake              643 ml  Output                0 ml  Net              643 ml   Weight change: 0.907 kg (2 lb) Exam:   General:  Pt is alert, follows commands appropriately, not in acute distress  HEENT: No icterus, No thrush, No neck mass, [place]/AT  Cardiovascular: RRR, S1/S2, no rubs, no gallops  Respiratory: Fine bibasilar crackles. No wheeze  Abdomen: Soft/+BS, non tender, non distended, no guarding  Extremities: No edema, No lymphangitis, No petechiae, No rashes, no synovitis   Data Reviewed: I have personally reviewed following labs and imaging studies Basic Metabolic Panel:  Recent Labs Lab 08/18/16 1025 08/18/16 1035 08/19/16 0400 08/20/16 0417 08/21/16 0552 08/22/16 0332  NA 139 139 136 136 135 136  K 3.5 3.5 3.7 3.6 3.5 3.0*  CL 106 106 103 102 101 102  CO2 19*  --  21* 20* 21* 24  GLUCOSE 255* 253* 123* 100* 108* 126*  BUN 40* 38* 44* 44* 46* 44*  CREATININE 3.37* 3.60* 3.33* 3.48* 3.51* 3.58*  CALCIUM 9.1  --  8.3* 8.7* 8.6* 8.2*   Liver Function Tests:  Recent Labs Lab 08/18/16 1025 08/19/16 0400 08/20/16 0417  AST 18 15 16   ALT 9* 10* 10*  ALKPHOS 81 62 69  BILITOT 0.8 0.7 1.0  PROT 7.1 5.9* 6.3*  ALBUMIN 3.4* 2.6* 2.9*   No results for input(s): LIPASE, AMYLASE in the last 168 hours. No results for input(s): AMMONIA in the last 168 hours. Coagulation Profile: No results for input(s): INR, PROTIME in the last 168 hours. CBC:  Recent Labs Lab 08/18/16 1025 08/18/16 1035 08/19/16 0555 08/19/16 0928 08/20/16 0417  WBC 11.0*  --  6.1 5.9 7.5  NEUTROABS 9.4*  --   --   --  6.1  HGB 8.2* 8.5* 6.4* 6.5* 9.6*  HCT 27.0*  25.0* 20.2* 21.0* 29.9*  MCV 92.8  --  90.6 90.5 90.9  PLT 401*  --  269 255 253   Cardiac Enzymes:  Recent Labs Lab 08/18/16 1615 08/18/16 2145 08/19/16 0400  TROPONINI 0.04* 0.05* 0.06*   BNP: Invalid input(s): POCBNP CBG:  Recent Labs Lab 08/21/16 1616 08/21/16 2113 08/22/16 0558 08/22/16 1120 08/22/16 1642  GLUCAP 121* 92 126* 106* 121*   HbA1C: No results for input(s): HGBA1C in the last 72 hours. Urine analysis:    Component Value Date/Time   COLORURINE YELLOW 01/15/2016 2112   APPEARANCEUR TURBID (A) 01/15/2016 2112   LABSPEC 1.015 01/15/2016 2112   PHURINE 5.0 01/15/2016 2112   GLUCOSEU 250 (A) 01/15/2016 2112   HGBUR LARGE (A) 01/15/2016 2112   BILIRUBINUR NEGATIVE 01/15/2016 2112   KETONESUR NEGATIVE 01/15/2016 2112   PROTEINUR 100 (A) 01/15/2016 2112   UROBILINOGEN 0.2 07/10/2015 0401  NITRITE NEGATIVE 01/15/2016 2112   LEUKOCYTESUR LARGE (A) 01/15/2016 2112   Sepsis Labs: @LABRCNTIP (procalcitonin:4,lacticidven:4) ) Recent Results (from the past 240 hour(s))  MRSA PCR Screening     Status: Abnormal   Collection Time: 08/18/16  2:36 PM  Result Value Ref Range Status   MRSA by PCR POSITIVE (A) NEGATIVE Final    Comment:        The GeneXpert MRSA Assay (FDA approved for NASAL specimens only), is one component of a comprehensive MRSA colonization surveillance program. It is not intended to diagnose MRSA infection nor to guide or monitor treatment for MRSA infections. RESULT CALLED TO, READ BACK BY AND VERIFIED WITH: Yevonne Aline RN 16:20 08/18/16 ([email or web address])   Culture, blood (Routine X 2) w Reflex to ID Panel     Status: None (Preliminary result)   Collection Time: 08/18/16  4:15 PM  Result Value Ref Range Status   Specimen Description BLOOD LEFT ANTECUBITAL  Final   Special Requests IN PEDIATRIC BOTTLE 2CC  Final   Culture NO GROWTH 4 DAYS  Final   Report Status PENDING  Incomplete  Culture, blood (Routine X 2) w Reflex to ID Panel     Status:  None (Preliminary result)   Collection Time: 08/18/16  4:21 PM  Result Value Ref Range Status   Specimen Description BLOOD RIGHT HAND  Final   Special Requests BOTTLES DRAWN AEROBIC ONLY 5CC  Final   Culture NO GROWTH 4 DAYS  Final   Report Status PENDING  Incomplete     Scheduled Meds: . sodium chloride   Intravenous Once  . amLODipine  10 mg Oral Daily  . atorvastatin  80 mg Oral QHS  . Chlorhexidine Gluconate Cloth  6 each Topical Q0600  . cloNIDine  0.1 mg Oral BID  . clopidogrel  75 mg Oral Daily  . donepezil  5 mg Oral QHS  . dorzolamide  1 drop Both Eyes BID   And  . timolol  1 drop Both Eyes BID  . furosemide  40 mg Oral BID  . heparin subcutaneous  5,000 Units Subcutaneous Q8H  . hydrALAZINE  100 mg Oral Q8H  . insulin aspart  0-9 Units Subcutaneous TID WC  . isosorbide dinitrate  20 mg Oral TID  . latanoprost  1 drop Right Eye QHS  . levofloxacin  250 mg Oral Daily  . levothyroxine  100 mcg Oral QAC breakfast  . mouth rinse  15 mL Mouth Rinse BID  . Melatonin  9 mg Oral QPM  . mirtazapine  15 mg Oral QHS  . mupirocin ointment  1 application Nasal BID  . pantoprazole  20 mg Oral Daily  . polyvinyl alcohol  1 drop Left Eye QID  . prednisoLONE acetate  1 drop Left Eye QID  . sertraline  50 mg Oral Daily  . sodium chloride flush  3 mL Intravenous Q12H  . [START ON 09/13/2016] Vitamin D (Ergocalciferol)  50,000 Units Oral Q30 days   Continuous Infusions:  Procedures/Studies: Dg Chest 2 View  Result Date: 08/19/2016 CLINICAL DATA:  [facility]- pt coming from [facility] nursing facility. Initially resp distress, on NRB at 98%. Pt on CPAP on on arrival. Hx of CHF, GERD, hypertension, stroke, myocardia infarction, and pneumonia. Hx of CABG in 2003. EXAM: CHEST - 2 VIEW COMPARISON:  08/18/2016 FINDINGS: Mild interstitial edema and central below vascular congestion, improved since previous. Small pleural effusions left greater than right, improved. Mild cardiomegaly.   Atheromatous aorta. Previous CABG. IMPRESSION: 1. Improving interstitial  edema and pleural effusions. Electronically Signed   By: Corlis Leak  Hassell M.D.   On: 08/19/2016 13:54   Nm Pulmonary Perf And Vent  Result Date: 08/19/2016 CLINICAL DATA:  Respiratory failure EXAM: NUCLEAR MEDICINE VENTILATION - PERFUSION LUNG SCAN TECHNIQUE: Ventilation images were obtained in multiple projections using inhaled aerosol Tc-5014m DTPA. Perfusion images were obtained in multiple projections after intravenous injection of Tc-8214m MAA. RADIOPHARMACEUTICALS:  31.0 mCi Technetium-4114m DTPA aerosol inhalation and 4.3 mCi Technetium-2014m MAA IV COMPARISON:  Chest x-ray 08/18/2016 FINDINGS: Ventilation: Patchy ventilation defects throughout the lungs, best seen in the right lung Perfusion: No wedge shaped peripheral perfusion defects to suggest acute pulmonary embolism. IMPRESSION: No perfusion defects to suggest pulmonary embolus. Electronically Signed   By: Charlett NoseKevin  Dover M.D.   On: 08/19/2016 13:48   Dg Chest Portable 1 View  Result Date: 08/18/2016 CLINICAL DATA:  Shortness of breath. EXAM: PORTABLE CHEST 1 VIEW COMPARISON:  One-view chest x-ray 01/15/2016. FINDINGS: The heart size is upper limits of normal. This is exaggerated by low lung volumes. Moderate edema and bilateral pleural effusions are new. Bibasilar airspace disease likely reflects atelectasis. Atherosclerotic calcifications are again noted at the aortic arch. Degenerate changes are noted the shoulder and thoracic spine. The visualized soft tissues and bony thorax are otherwise unremarkable. IMPRESSION: 1. Congestive heart failure. 2. Bibasilar airspace disease likely reflects atelectasis. Infection is not excluded. 3. Aortic atherosclerosis. Electronically Signed   By: Marin Robertshristopher  Mattern M.D.   On: 08/18/2016 11:08    Sedra Morfin, DO  [facility] Pager [phone]  If 7PM-7AM, please contact night-coverage [email or web address] Password [email or web address] 08/22/2016, 5:04  PM   LOS: 2 days

## 2016-08-23 DIAGNOSIS — I2510 Atherosclerotic heart disease of native coronary artery without angina pectoris: Secondary | ICD-10-CM

## 2016-08-23 DIAGNOSIS — J9600 Acute respiratory failure, unspecified whether with hypoxia or hypercapnia: Secondary | ICD-10-CM

## 2016-08-23 DIAGNOSIS — I159 Secondary hypertension, unspecified: Secondary | ICD-10-CM

## 2016-08-23 DIAGNOSIS — I5033 Acute on chronic diastolic (congestive) heart failure: Secondary | ICD-10-CM

## 2016-08-23 LAB — CULTURE, BLOOD (ROUTINE X 2)
CULTURE: NO GROWTH
CULTURE: NO GROWTH

## 2016-08-23 LAB — BASIC METABOLIC PANEL
Anion gap: 10 (ref 5–15)
BUN: 44 mg/dL — AB (ref 6–20)
CHLORIDE: 104 mmol/L (ref 101–111)
CO2: 24 mmol/L (ref 22–32)
CREATININE: 3.82 mg/dL — AB (ref 0.44–1.00)
Calcium: 8.4 mg/dL — ABNORMAL LOW (ref 8.9–10.3)
GFR, EST AFRICAN AMERICAN: 13 mL/min — AB (ref >60)
GFR, EST NON AFRICAN AMERICAN: 11 mL/min — AB (ref >60)
Glucose, Bld: 127 mg/dL — ABNORMAL HIGH (ref 65–99)
Potassium: 3.9 mmol/L (ref 3.5–5.1)
SODIUM: 138 mmol/L (ref 135–145)

## 2016-08-23 LAB — GLUCOSE, CAPILLARY
GLUCOSE-CAPILLARY: 120 mg/dL — AB (ref 65–99)
GLUCOSE-CAPILLARY: 143 mg/dL — AB (ref 65–99)
GLUCOSE-CAPILLARY: 103 mg/dL — AB (ref 65–99)
Glucose-Capillary: 103 mg/dL — ABNORMAL HIGH (ref 65–99)

## 2016-08-23 LAB — MAGNESIUM: MAGNESIUM: 2 mg/dL (ref 1.7–2.4)

## 2016-08-23 LAB — CBC
HCT: 30.5 % — ABNORMAL LOW (ref 36.0–46.0)
Hemoglobin: 9.6 g/dL — ABNORMAL LOW (ref 12.0–15.0)
MCH: 29.4 pg (ref 26.0–34.0)
MCHC: 31.5 g/dL (ref 30.0–36.0)
MCV: 93.3 fL (ref 78.0–100.0)
PLATELETS: 251 10*3/uL (ref 150–400)
RBC: 3.27 MIL/uL — ABNORMAL LOW (ref 3.87–5.11)
RDW: 14.4 % (ref 11.5–15.5)
WBC: 4.9 10*3/uL (ref 4.0–10.5)

## 2016-08-23 LAB — PROCALCITONIN: PROCALCITONIN: 0.36 ng/mL

## 2016-08-23 MED ORDER — GUAIFENESIN-DM 100-10 MG/5ML PO SYRP
5.0000 mL | ORAL_SOLUTION | ORAL | Status: DC | PRN
  Administered 2016-08-23: 5 mL via ORAL
  Filled 2016-08-23: qty 5

## 2016-08-23 MED ORDER — CLONIDINE HCL 0.1 MG PO TABS
0.1000 mg | ORAL_TABLET | Freq: Two times a day (BID) | ORAL | Status: DC
  Administered 2016-08-23 – 2016-08-24 (×2): 0.1 mg via ORAL
  Filled 2016-08-23 (×2): qty 1

## 2016-08-23 NOTE — Clinical Social Work Note (Signed)
Patient's family will need to go to SNF to do paperwork prior to readmission. SNF has not been able to reach family. CSW called and left voicemail for patient's HCPOA.  Charlynn CourtSarah Sani Madariaga, CSW [phone]

## 2016-08-23 NOTE — Progress Notes (Signed)
PROGRESS NOTE    Taylor Padilla  XLK:[id number] DOB: 1946-07-11 DOA: 08/18/2016 PCP: No PCP Per Patient   Chief Complaint  Patient presents with  . Respiratory Distress     Brief Narrative:  70 y.o.femalewith extensive medical history including diastolic CHF, hypertension, CAD status post MI, diabetes, history of CVA, presenting to the emergency department with sudden onset of shortness of breath since this morning. The patient reported orthopnea, but not paroxysmal nocturnal dyspnea. She denies any chest pain or palpitations. She denies any history of DVT or PE. She denies any recent long distance trips, or hormonal replacement she denies any changes in her medications, and she is compliant with them. Last doses of her medications were given today. She denies any fever, however she reports chills today, feeling very cold. She is noted aware of any sick contacts, but she does live in a nursing home. She is very sedentary. She denies any nausea, vomiting, abdominal pain, appetite changes, dysuria, hematuria, lower extremity swelling. She denies any headaches, or confusion. She is legally blind. On transit, she was on NRB, later placed on CPAP. She was in 6 L, and now she is on 2.5 L of nasal cannula. The patient is not on oxygen at home. On transit, the patient received albuterol, nitro, and Atrovent nebulizers, with improvement of her symptoms  Assessment & Plan   Acute Respiratory failure with hypoxia -Likely related to CHF -Initial x-ray showed CHF, bibasilar airspace disease, likely atelectasis versus infection -Lactic acid was normal -Continue supplemental oxygen to maintain sats above 92% -D-dimer positive but VQ scan showed no signs of PE -Lower extremity Dopplers negative for DVT -X-ray on 08/19/2016 showed improvement -Influenza A, blood cultures negative -patient was placed on Levaquin, had 5 days worth  Acute on chronic diastolic heart failure -Cardiology consultation  appreciated -Currently on oral Lasix (switched 08/22/2016) -Echocardiogram 08/19/2016 showed an EF of 50-55%, no RWA A, PASP 83, moderate TR, MR -Continue to monitor intake and output, daily weights  Anemia of chronic disease -Given 2 u PRBCs this admission, 08/19/2016 -hemoglobin 9.6 today -FOBT ordered however no stools at this time -Continue to monitor CBC  Acute on chronic kidney disease, stage III -Baseline creatinine 2.2 2.4, currently 3.82 -Nephrology consulted and appreciated, patient not hemodialysis candidate  Coronary artery disease status post CABG/elevated troponin -Cardiology consulted and appreciated -Likely secondary to CHF -No complaints of chest pain -Continue Plavix 75 mg daily  Hypertension/hypertensive heart disease -Clonidine decreased today -Patient mildly bradycardic, avoid beta blockers -Continue amlodipine, Lasix, Isordil, hydralazine  Hyperlipidemia -Continue statin  Hypothyroidism -TSH normal, continue Synthroid  Diabetes mellitus, type II -Hemoglobin A1c 5.8 -Home medications held -Continue insulin sliding scale and CBG monitoring  GERD -Continue PPI  Deconditioning -PT OT recommended SNF  Depression -Paxil discontinued -Continue Zoloft and Remeron  DVT Prophylaxis  heparin  Code Status: DNR  Family Communication: None at bedside  Disposition Plan: Admitted. SNF when stable  Consultants Cardiology Nephrology   Procedures  Echocardiogram LE doppler  Antibiotics   Anti-infectives    Start     Dose/Rate Route Frequency Ordered Stop   08/18/16 1800  levofloxacin (LEVAQUIN) tablet 250 mg  Status:  Discontinued    Comments:  After cultures are drawn   250 mg Oral Daily 08/18/16 1351 08/22/16 1711      Subjective:   Taylor Padilla seen and examined today.  Patient states she does not feel well. Does start breathing is mildly improved because she has a cold. Denies chest pain,  headache, dizziness, abdominal pain, nausea,  vomiting, diarrhea.  Does not feel she wants to speak more or open her eyes.    Objective:   Vitals:   08/22/16 1200 08/22/16 2049 08/22/16 2058 08/23/16 0607  BP: (!) 159/59 (!) 159/54  (!) 154/50  Pulse: (!) 50 (!) 53  (!) 43  Resp: 16 16  18   Temp: 98 F (36.7 C) 99.1 F (37.3 C)  97.9 F (36.6 C)  TempSrc: Oral Oral    SpO2: 95% (!) 87% 98%   Weight:    59.9 kg (132 lb)  Height:        Intake/Output Summary (Last 24 hours) at 08/23/16 1052 Last data filed at 08/23/16 0830  Gross per 24 hour  Intake              960 ml  Output                5 ml  Net              955 ml   Filed Weights   08/21/16 0618 08/22/16 0529 08/23/16 0607  Weight: 61.7 kg (136 lb) 62.6 kg (138 lb) 59.9 kg (132 lb)    Exam  General: Well developed, well nourished, NAD  HEENT: NCAT, mucous membranes moist.  Eyes closed.  Cardiovascular: S1 S2 auscultated, Regular rate and rhythm.  Respiratory: diminished, but clear  Abdomen: Soft, nontender, nondistended, + bowel sounds  Extremities: warm dry without cyanosis clubbing or edema  Neuro: AAOx3, nonfocal  Psych: Normal affect and demeanor   Data Reviewed: I have personally reviewed following labs and imaging studies  CBC:  Recent Labs Lab 08/18/16 1025 08/18/16 1035 08/19/16 0555 08/19/16 0928 08/20/16 0417 08/23/16 0410  WBC 11.0*  --  6.1 5.9 7.5 4.9  NEUTROABS 9.4*  --   --   --  6.1  --   HGB 8.2* 8.5* 6.4* 6.5* 9.6* 9.6*  HCT 27.0* 25.0* 20.2* 21.0* 29.9* 30.5*  MCV 92.8  --  90.6 90.5 90.9 93.3  PLT 401*  --  269 255 253 251   Basic Metabolic Panel:  Recent Labs Lab 08/19/16 0400 08/20/16 0417 08/21/16 0552 08/22/16 0332 08/23/16 0410  NA 136 136 135 136 138  K 3.7 3.6 3.5 3.0* 3.9  CL 103 102 101 102 104  CO2 21* 20* 21* 24 24  GLUCOSE 123* 100* 108* 126* 127*  BUN 44* 44* 46* 44* 44*  CREATININE 3.33* 3.48* 3.51* 3.58* 3.82*  CALCIUM 8.3* 8.7* 8.6* 8.2* 8.4*  MG  --   --   --   --  2.0    GFR: Estimated Creatinine Clearance: 11.2 mL/min (by C-G formula based on SCr of 3.82 mg/dL (H)). Liver Function Tests:  Recent Labs Lab 08/18/16 1025 08/19/16 0400 08/20/16 0417  AST 18 15 16   ALT 9* 10* 10*  ALKPHOS 81 62 69  BILITOT 0.8 0.7 1.0  PROT 7.1 5.9* 6.3*  ALBUMIN 3.4* 2.6* 2.9*   No results for input(s): LIPASE, AMYLASE in the last 168 hours. No results for input(s): AMMONIA in the last 168 hours. Coagulation Profile: No results for input(s): INR, PROTIME in the last 168 hours. Cardiac Enzymes:  Recent Labs Lab 08/18/16 1615 08/18/16 2145 08/19/16 0400  TROPONINI 0.04* 0.05* 0.06*   BNP (last 3 results) No results for input(s): PROBNP in the last 8760 hours. HbA1C: No results for input(s): HGBA1C in the last 72 hours. CBG:  Recent Labs Lab 08/22/16 0558 08/22/16  1120 08/22/16 1642 08/22/16 2046 08/23/16 0609  GLUCAP 126* 106* 121* 83 120*   Lipid Profile: No results for input(s): CHOL, HDL, LDLCALC, TRIG, CHOLHDL, LDLDIRECT in the last 72 hours. Thyroid Function Tests: No results for input(s): TSH, T4TOTAL, FREET4, T3FREE, THYROIDAB in the last 72 hours. Anemia Panel: No results for input(s): VITAMINB12, FOLATE, FERRITIN, TIBC, IRON, RETICCTPCT in the last 72 hours. Urine analysis:    Component Value Date/Time   COLORURINE YELLOW 01/15/2016 2112   APPEARANCEUR TURBID (A) 01/15/2016 2112   LABSPEC 1.015 01/15/2016 2112   PHURINE 5.0 01/15/2016 2112   GLUCOSEU 250 (A) 01/15/2016 2112   HGBUR LARGE (A) 01/15/2016 2112   BILIRUBINUR NEGATIVE 01/15/2016 2112   KETONESUR NEGATIVE 01/15/2016 2112   PROTEINUR 100 (A) 01/15/2016 2112   UROBILINOGEN 0.2 07/10/2015 0401   NITRITE NEGATIVE 01/15/2016 2112   LEUKOCYTESUR LARGE (A) 01/15/2016 2112   Sepsis Labs: @LABRCNTIP (procalcitonin:4,lacticidven:4)  ) Recent Results (from the past 240 hour(s))  MRSA PCR Screening     Status: Abnormal   Collection Time: 08/18/16  2:36 PM  Result Value  Ref Range Status   MRSA by PCR POSITIVE (A) NEGATIVE Final    Comment:        The GeneXpert MRSA Assay (FDA approved for NASAL specimens only), is one component of a comprehensive MRSA colonization surveillance program. It is not intended to diagnose MRSA infection nor to guide or monitor treatment for MRSA infections. RESULT CALLED TO, READ BACK BY AND VERIFIED WITH: Yevonne Aline RN 16:20 08/18/16 ([email or web address])   Culture, blood (Routine X 2) w Reflex to ID Panel     Status: None (Preliminary result)   Collection Time: 08/18/16  4:15 PM  Result Value Ref Range Status   Specimen Description BLOOD LEFT ANTECUBITAL  Final   Special Requests IN PEDIATRIC BOTTLE 2CC  Final   Culture NO GROWTH 4 DAYS  Final   Report Status PENDING  Incomplete  Culture, blood (Routine X 2) w Reflex to ID Panel     Status: None (Preliminary result)   Collection Time: 08/18/16  4:21 PM  Result Value Ref Range Status   Specimen Description BLOOD RIGHT HAND  Final   Special Requests BOTTLES DRAWN AEROBIC ONLY 5CC  Final   Culture NO GROWTH 4 DAYS  Final   Report Status PENDING  Incomplete      Radiology Studies: No results found.   Scheduled Meds: . sodium chloride   Intravenous Once  . amLODipine  10 mg Oral Daily  . atorvastatin  80 mg Oral QHS  . Chlorhexidine Gluconate Cloth  6 each Topical Q0600  . cloNIDine  0.1 mg Oral BID  . clopidogrel  75 mg Oral Daily  . donepezil  5 mg Oral QHS  . dorzolamide  1 drop Both Eyes BID   And  . timolol  1 drop Both Eyes BID  . furosemide  40 mg Oral BID  . heparin subcutaneous  5,000 Units Subcutaneous Q8H  . hydrALAZINE  100 mg Oral Q8H  . insulin aspart  0-9 Units Subcutaneous TID WC  . isosorbide dinitrate  20 mg Oral TID  . latanoprost  1 drop Right Eye QHS  . levothyroxine  100 mcg Oral QAC breakfast  . mouth rinse  15 mL Mouth Rinse BID  . Melatonin  9 mg Oral QPM  . mirtazapine  15 mg Oral QHS  . mupirocin ointment  1 application Nasal BID  .  pantoprazole  20 mg Oral Daily  .  polyvinyl alcohol  1 drop Left Eye QID  . prednisoLONE acetate  1 drop Left Eye QID  . sertraline  50 mg Oral Daily  . sodium chloride flush  3 mL Intravenous Q12H  . [START ON 09/13/2016] Vitamin D (Ergocalciferol)  50,000 Units Oral Q30 days   Continuous Infusions:   LOS: 3 days   Time Spent in minutes   30 minutes  Danyal Adorno D.O. on 08/23/2016 at 10:52 AM  Between 7am to 7pm - Pager - [phone]  After 7pm go to [email or web address] - password [email or web address]  And look for the night coverage person covering for me after hours  [facility] Office  [phone]

## 2016-08-23 NOTE — Progress Notes (Signed)
Patient Name: Taylor Padilla Date of Encounter: 08/23/2016  Primary Cardiologist: Dr. Gala Romney ([facility] 16)  Hospital Problem List     Active Problems:   Diabetes mellitus type II, uncontrolled (HCC)   Hypertension   CAD (coronary artery disease)   CKD (chronic kidney disease) stage 4, GFR 15-29 ml/min (HCC)   Hypothyroidism   Dyslipidemia   GERD (gastroesophageal reflux disease)   Acute respiratory failure with hypoxia (HCC)   Anemia   Acute diastolic heart failure, NYHA class 1 (HCC)   CHF exacerbation (HCC)   Hypertensive heart disease   Valvular heart disease   Type II diabetes mellitus (HCC)   PAH (pulmonary artery hypertension)   Normocytic anemia   CHF (congestive heart failure) (HCC)   Acute on chronic diastolic congestive heart failure (HCC)   Acute respiratory failure (HCC)   Acute renal failure superimposed on stage 4 chronic kidney disease (HCC)     Subjective   Curled up in a ball with eyes closed. Would not roll over for me to listen to her back. Complains of dyspnea. Not sure if worse than yesterday.  Inpatient Medications    Scheduled Meds: . sodium chloride   Intravenous Once  . amLODipine  10 mg Oral Daily  . atorvastatin  80 mg Oral QHS  . Chlorhexidine Gluconate Cloth  6 each Topical Q0600  . cloNIDine  0.2 mg Oral BID  . clopidogrel  75 mg Oral Daily  . donepezil  5 mg Oral QHS  . dorzolamide  1 drop Both Eyes BID   And  . timolol  1 drop Both Eyes BID  . furosemide  40 mg Oral BID  . heparin subcutaneous  5,000 Units Subcutaneous Q8H  . hydrALAZINE  100 mg Oral Q8H  . insulin aspart  0-9 Units Subcutaneous TID WC  . isosorbide dinitrate  20 mg Oral TID  . latanoprost  1 drop Right Eye QHS  . levothyroxine  100 mcg Oral QAC breakfast  . mouth rinse  15 mL Mouth Rinse BID  . Melatonin  9 mg Oral QPM  . mirtazapine  15 mg Oral QHS  . mupirocin ointment  1 application Nasal BID  . pantoprazole  20 mg Oral Daily  . polyvinyl alcohol   1 drop Left Eye QID  . prednisoLONE acetate  1 drop Left Eye QID  . sertraline  50 mg Oral Daily  . sodium chloride flush  3 mL Intravenous Q12H  . [START ON 09/13/2016] Vitamin D (Ergocalciferol)  50,000 Units Oral Q30 days   Continuous Infusions:  PRN Meds: sodium chloride, acetaminophen, ipratropium, ondansetron (ZOFRAN) IV, sodium chloride flush, technetium TC 60M diethylenetriame-pentaacetic acid   Vital Signs    Vitals:   08/22/16 1200 08/22/16 2049 08/22/16 2058 08/23/16 0607  BP: (!) 159/59 (!) 159/54  (!) 154/50  Pulse: (!) 50 (!) 53  (!) 43  Resp: 16 16  18   Temp: 98 F (36.7 C) 99.1 F (37.3 C)  97.9 F (36.6 C)  TempSrc: Oral Oral    SpO2: 95% (!) 87% 98%   Weight:    132 lb (59.9 kg)  Height:        Intake/Output Summary (Last 24 hours) at 08/23/16 0857 Last data filed at 08/23/16 0200  Gross per 24 hour  Intake              720 ml  Output                5  ml  Net              715 ml   Filed Weights   08/21/16 0618 08/22/16 0529 08/23/16 [id number]  Weight: 136 lb (61.7 kg) 138 lb (62.6 kg) 132 lb (59.9 kg)    Physical Exam    GEN: Pleasant older female, in no acute distress. Curled up in a ball with eyes closed HEENT: Grossly normal.  Neck: Supple Cardiac: RRR + JVD Respiratory:  CTA anteriorly GI: Soft, nontender, nondistended MS: no deformity or atrophy. Skin: warm and dry, no rash. Neuro:  Strength and sensation are intact. Psych: AAOx3.  Normal affect.  Labs    CBC  Recent Labs  08/23/16 0410  WBC 4.9  HGB 9.6*  HCT 30.5*  MCV 93.3  PLT 251   Basic Metabolic Panel  Recent Labs  08/22/16 0332 08/23/16 0410  NA 136 138  K 3.0* 3.9  CL 102 104  CO2 24 24  GLUCOSE 126* 127*  BUN 44* 44*  CREATININE 3.58* 3.82*  CALCIUM 8.2* 8.4*  MG  --  2.0   Liver Function Tests No results for input(s): AST, ALT, ALKPHOS, BILITOT, PROT, ALBUMIN in the last 72 hours.   Telemetry    Sinus bradycardia - Personally Reviewed   Cardiac  Studies   TTE: 08/18/16 Study Conclusions - Left ventricle: The cavity size was normal. There was mild   concentric hypertrophy. Systolic function was normal. The   estimated ejection fraction was in the range of 50% to 55%. Wall   motion was normal; there were no regional wall motion   abnormalities. - Aortic valve: Transvalvular velocity was within the normal range.   There was no stenosis. There was no regurgitation. 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   There was moderate regurgitation. - Left atrium: The atrium was severely dilated. - Right ventricle: The cavity size was normal. Wall thickness was   normal. Systolic function was normal. - Atrial septum: No defect or patent foramen ovale was identified   by color flow Doppler. - Tricuspid valve: There was moderate regurgitation. - Pulmonary arteries: Systolic pressure was severely increased. PA   peak pressure: 83 mm Hg (S). - Pericardium, extracardiac: There was a left pleural effusion.   Ascites was noted.  Patient Profile     70 y/o ? with a h/o CAD s/p CABG in 2003, chronic diastolic CHF, HTN, HL, DM, CKD III-IV, glaucoma w/ legal blindness, valvular heart disease, hypothyroidism, and normocytic anemia, who was admitted 12/15 with dyspnea and found to be volume overloaded and subsequently anemic.  Assessment & Plan    1.  Acute on chronic diastolic CHF/Acute respiratory failure:  Pt improving on exam; I/O not accurate due to incontinence; Weight down 8 lbs (140--> 132). Lasix converted to 40 po BID yesterday and weight continued to drop. She still complains of dyspnea, but difficult historian. JVD elevated but could be in part from her TR. Would not change lasix at this time.   2.  Normocytic Anemia with acute worsening: Transfused2 units 08/19/16. H/H has remained stable around 9.6/30.5. Per IM  3.  Hypertension:  BP borderline controlled. Continue present meds and follow.  4.  Acute on  chronic stage IV kidney dzs: not a dialysis candidate per nephrology; follow renal function closely. Creat 3.82 today.  5.  DM II:  Per IM.  6. Sinus bradycardia: HR in 40s on tele. On clonidine and donepezil  Signed, Cline CrockKathryn Thompson, PA-C  08/23/2016, 8:57 AM  As above, patient seen and examined. She denies chest pain this morning. Minimal dyspnea but she states I have a "cold". She is not volume overloaded on examination. Continue Lasix at present dose. She will need her renal function followed closely as she has been deemed not a dialysis candidate by nephrology. She is bradycardic at times. Decrease clonidine to 0.1 mg twice a day for 2 days and then discontinue. We will need to watch blood pressure closely and add additional medications as needed. We will need to avoid beta-blockade given bradycardia and ACE inhibitor/ARB given severity of renal insufficiency. Other issues per primary care.  Olga MillersBrian Crenshaw, MD

## 2016-08-23 NOTE — Progress Notes (Signed)
Nutrition Brief Note  Patient identified due to low Braden score.   Wt Readings from Last 15 Encounters:  08/23/16 132 lb (59.9 kg)  01/15/16 131 lb 9.8 oz (59.7 kg)  10/24/15 147 lb (66.7 kg)  07/14/15 147 lb 12.8 oz (67 kg)  05/30/15 137 lb (62.1 kg)  03/24/15 137 lb (62.1 kg)  03/10/15 138 lb 9.6 oz (62.9 kg)  03/05/15 151 lb 1.8 oz (68.5 kg)  03/03/15 151 lb 1.8 oz (68.5 kg)  12/31/14 146 lb (66.2 kg)  12/07/14 149 lb 8 oz (67.8 kg)  11/26/14 145 lb 1 oz (65.8 kg)  11/03/14 151 lb 6.4 oz (68.7 kg)  10/10/14 144 lb 6.4 oz (65.5 kg)    Body mass index is 24.94 kg/m. Patient meets criteria for Normal Weight based on current BMI.   Current diet order is Renal/Carb Modified, patient is consuming approximately 25 to 80% of meals at this time. Pt states that her appetite is good and she has been eating normally. She states that she has been maintaining her weight recently. She is not interested in talking with RD and denies any nutrition needs at this time. Labs and medications reviewed.   No nutrition interventions warranted at this time. If nutrition issues arise, please consult RD.   Dorothea Ogleeanne Lola Lofaro RD, CSP, LDN Inpatient Clinical Dietitian Pager: [phone] After Hours Pager: [phone]

## 2016-08-24 ENCOUNTER — Encounter (HOSPITAL_COMMUNITY): Payer: Self-pay | Admitting: Emergency Medicine

## 2016-08-24 ENCOUNTER — Inpatient Hospital Stay (HOSPITAL_COMMUNITY)
Admission: EM | Admit: 2016-08-24 | Discharge: 2016-08-26 | DRG: 194 | Disposition: A | Discharge status: Skilled Nursing Facility | Payer: Medicare Other | Attending: Family Medicine | Admitting: Family Medicine

## 2016-08-24 ENCOUNTER — Emergency Department (HOSPITAL_COMMUNITY): Payer: Medicare Other

## 2016-08-24 DIAGNOSIS — R001 Bradycardia, unspecified: Secondary | ICD-10-CM | POA: Diagnosis not present

## 2016-08-24 DIAGNOSIS — H548 Legal blindness, as defined in USA: Secondary | ICD-10-CM | POA: Diagnosis present

## 2016-08-24 DIAGNOSIS — N184 Chronic kidney disease, stage 4 (severe): Secondary | ICD-10-CM | POA: Diagnosis present

## 2016-08-24 DIAGNOSIS — R0602 Shortness of breath: Secondary | ICD-10-CM

## 2016-08-24 DIAGNOSIS — R509 Fever, unspecified: Secondary | ICD-10-CM

## 2016-08-24 DIAGNOSIS — Z66 Do not resuscitate: Secondary | ICD-10-CM | POA: Diagnosis present

## 2016-08-24 DIAGNOSIS — Z961 Presence of intraocular lens: Secondary | ICD-10-CM | POA: Diagnosis present

## 2016-08-24 DIAGNOSIS — I2721 Secondary pulmonary arterial hypertension: Secondary | ICD-10-CM | POA: Diagnosis present

## 2016-08-24 DIAGNOSIS — K219 Gastro-esophageal reflux disease without esophagitis: Secondary | ICD-10-CM | POA: Diagnosis present

## 2016-08-24 DIAGNOSIS — R918 Other nonspecific abnormal finding of lung field: Secondary | ICD-10-CM

## 2016-08-24 DIAGNOSIS — E871 Hypo-osmolality and hyponatremia: Secondary | ICD-10-CM | POA: Diagnosis present

## 2016-08-24 DIAGNOSIS — I2510 Atherosclerotic heart disease of native coronary artery without angina pectoris: Secondary | ICD-10-CM | POA: Diagnosis present

## 2016-08-24 DIAGNOSIS — I130 Hypertensive heart and chronic kidney disease with heart failure and stage 1 through stage 4 chronic kidney disease, or unspecified chronic kidney disease: Secondary | ICD-10-CM | POA: Diagnosis present

## 2016-08-24 DIAGNOSIS — E118 Type 2 diabetes mellitus with unspecified complications: Secondary | ICD-10-CM

## 2016-08-24 DIAGNOSIS — Z8673 Personal history of transient ischemic attack (TIA), and cerebral infarction without residual deficits: Secondary | ICD-10-CM

## 2016-08-24 DIAGNOSIS — I10 Essential (primary) hypertension: Secondary | ICD-10-CM | POA: Diagnosis present

## 2016-08-24 DIAGNOSIS — Z993 Dependence on wheelchair: Secondary | ICD-10-CM

## 2016-08-24 DIAGNOSIS — J129 Viral pneumonia, unspecified: Principal | ICD-10-CM | POA: Diagnosis present

## 2016-08-24 DIAGNOSIS — B974 Respiratory syncytial virus as the cause of diseases classified elsewhere: Secondary | ICD-10-CM | POA: Diagnosis present

## 2016-08-24 DIAGNOSIS — E039 Hypothyroidism, unspecified: Secondary | ICD-10-CM | POA: Diagnosis present

## 2016-08-24 DIAGNOSIS — E1122 Type 2 diabetes mellitus with diabetic chronic kidney disease: Secondary | ICD-10-CM | POA: Diagnosis present

## 2016-08-24 DIAGNOSIS — Z22322 Carrier or suspected carrier of Methicillin resistant Staphylococcus aureus: Secondary | ICD-10-CM

## 2016-08-24 DIAGNOSIS — E1121 Type 2 diabetes mellitus with diabetic nephropathy: Secondary | ICD-10-CM

## 2016-08-24 DIAGNOSIS — Z7902 Long term (current) use of antithrombotics/antiplatelets: Secondary | ICD-10-CM

## 2016-08-24 DIAGNOSIS — Z79899 Other long term (current) drug therapy: Secondary | ICD-10-CM

## 2016-08-24 DIAGNOSIS — Z951 Presence of aortocoronary bypass graft: Secondary | ICD-10-CM

## 2016-08-24 DIAGNOSIS — Z9981 Dependence on supplemental oxygen: Secondary | ICD-10-CM

## 2016-08-24 DIAGNOSIS — I5032 Chronic diastolic (congestive) heart failure: Secondary | ICD-10-CM | POA: Diagnosis present

## 2016-08-24 DIAGNOSIS — N179 Acute kidney failure, unspecified: Secondary | ICD-10-CM | POA: Diagnosis present

## 2016-08-24 DIAGNOSIS — Z794 Long term (current) use of insulin: Secondary | ICD-10-CM

## 2016-08-24 DIAGNOSIS — Z888 Allergy status to other drugs, medicaments and biological substances status: Secondary | ICD-10-CM

## 2016-08-24 DIAGNOSIS — D649 Anemia, unspecified: Secondary | ICD-10-CM | POA: Diagnosis present

## 2016-08-24 DIAGNOSIS — E1165 Type 2 diabetes mellitus with hyperglycemia: Secondary | ICD-10-CM

## 2016-08-24 LAB — CBC
HCT: 28.9 % — ABNORMAL LOW (ref 36.0–46.0)
HEMOGLOBIN: 9.2 g/dL — AB (ref 12.0–15.0)
MCH: 29.5 pg (ref 26.0–34.0)
MCHC: 31.8 g/dL (ref 30.0–36.0)
MCV: 92.6 fL (ref 78.0–100.0)
Platelets: 226 10*3/uL (ref 150–400)
RBC: 3.12 MIL/uL — AB (ref 3.87–5.11)
RDW: 14.9 % (ref 11.5–15.5)
WBC: 7.6 10*3/uL (ref 4.0–10.5)

## 2016-08-24 LAB — CBC WITH DIFFERENTIAL/PLATELET
BASOS PCT: 0 %
Basophils Absolute: 0 10*3/uL (ref 0.0–0.1)
EOS ABS: 0 10*3/uL (ref 0.0–0.7)
EOS PCT: 0 %
HCT: 27.1 % — ABNORMAL LOW (ref 36.0–46.0)
HEMOGLOBIN: 8.7 g/dL — AB (ref 12.0–15.0)
Lymphocytes Relative: 7 %
Lymphs Abs: 0.5 10*3/uL — ABNORMAL LOW (ref 0.7–4.0)
MCH: 28.9 pg (ref 26.0–34.0)
MCHC: 32.1 g/dL (ref 30.0–36.0)
MCV: 90 fL (ref 78.0–100.0)
Monocytes Absolute: 0.5 10*3/uL (ref 0.1–1.0)
Monocytes Relative: 7 %
NEUTROS PCT: 86 %
Neutro Abs: 6 10*3/uL (ref 1.7–7.7)
PLATELETS: 228 10*3/uL (ref 150–400)
RBC: 3.01 MIL/uL — AB (ref 3.87–5.11)
RDW: 14.4 % (ref 11.5–15.5)
WBC: 7.1 10*3/uL (ref 4.0–10.5)

## 2016-08-24 LAB — BASIC METABOLIC PANEL
ANION GAP: 12 (ref 5–15)
BUN: 45 mg/dL — ABNORMAL HIGH (ref 6–20)
CALCIUM: 8.3 mg/dL — AB (ref 8.9–10.3)
CO2: 24 mmol/L (ref 22–32)
Chloride: 101 mmol/L (ref 101–111)
Creatinine, Ser: 3.99 mg/dL — ABNORMAL HIGH (ref 0.44–1.00)
GFR, EST AFRICAN AMERICAN: 12 mL/min — AB (ref >60)
GFR, EST NON AFRICAN AMERICAN: 10 mL/min — AB (ref >60)
Glucose, Bld: 86 mg/dL (ref 65–99)
Potassium: 3.9 mmol/L (ref 3.5–5.1)
Sodium: 137 mmol/L (ref 135–145)

## 2016-08-24 LAB — COMPREHENSIVE METABOLIC PANEL
ALK PHOS: 62 U/L (ref 38–126)
ALT: 10 U/L — AB (ref 14–54)
AST: 21 U/L (ref 15–41)
Albumin: 2.9 g/dL — ABNORMAL LOW (ref 3.5–5.0)
Anion gap: 11 (ref 5–15)
BILIRUBIN TOTAL: 0.7 mg/dL (ref 0.3–1.2)
BUN: 48 mg/dL — ABNORMAL HIGH (ref 6–20)
CALCIUM: 8.2 mg/dL — AB (ref 8.9–10.3)
CO2: 24 mmol/L (ref 22–32)
CREATININE: 4.12 mg/dL — AB (ref 0.44–1.00)
Chloride: 97 mmol/L — ABNORMAL LOW (ref 101–111)
GFR, EST AFRICAN AMERICAN: 12 mL/min — AB (ref >60)
GFR, EST NON AFRICAN AMERICAN: 10 mL/min — AB (ref >60)
Glucose, Bld: 110 mg/dL — ABNORMAL HIGH (ref 65–99)
Potassium: 3.8 mmol/L (ref 3.5–5.1)
Sodium: 132 mmol/L — ABNORMAL LOW (ref 135–145)
Total Protein: 6 g/dL — ABNORMAL LOW (ref 6.5–8.1)

## 2016-08-24 LAB — EXPECTORATED SPUTUM ASSESSMENT W GRAM STAIN, RFLX TO RESP C

## 2016-08-24 LAB — I-STAT CG4 LACTIC ACID, ED
Lactic Acid, Venous: 0.54 mmol/L (ref 0.5–1.9)
Lactic Acid, Venous: 0.42 mmol/L — ABNORMAL LOW (ref 0.5–1.9)

## 2016-08-24 LAB — INFLUENZA PANEL BY PCR (TYPE A & B)
INFLBPCR: NEGATIVE
Influenza A By PCR: NEGATIVE

## 2016-08-24 LAB — GLUCOSE, CAPILLARY
GLUCOSE-CAPILLARY: 109 mg/dL — AB (ref 65–99)
Glucose-Capillary: 100 mg/dL — ABNORMAL HIGH (ref 65–99)
Glucose-Capillary: 123 mg/dL — ABNORMAL HIGH (ref 65–99)

## 2016-08-24 LAB — I-STAT TROPONIN, ED: TROPONIN I, POC: 0.02 ng/mL (ref 0.00–0.08)

## 2016-08-24 LAB — BRAIN NATRIURETIC PEPTIDE: B NATRIURETIC PEPTIDE 5: 1397 pg/mL — AB (ref 0.0–100.0)

## 2016-08-24 MED ORDER — HYDRALAZINE HCL 50 MG PO TABS
100.0000 mg | ORAL_TABLET | Freq: Three times a day (TID) | ORAL | Status: DC
  Administered 2016-08-25 – 2016-08-26 (×6): 100 mg via ORAL
  Filled 2016-08-24 (×6): qty 2

## 2016-08-24 MED ORDER — ENSURE ENLIVE PO LIQD
1.0000 | Freq: Three times a day (TID) | ORAL | Status: DC
  Administered 2016-08-25 (×3): 237 mL via ORAL

## 2016-08-24 MED ORDER — CLONIDINE HCL 0.1 MG PO TABS: 0.1000 mg | ORAL_TABLET | Freq: Two times a day (BID) | ORAL | 0 refills | Status: DC

## 2016-08-24 MED ORDER — DOXAZOSIN MESYLATE 2 MG PO TABS
2.0000 mg | ORAL_TABLET | Freq: Every day | ORAL | Status: DC
  Administered 2016-08-25 – 2016-08-26 (×2): 2 mg via ORAL
  Filled 2016-08-24 (×2): qty 1

## 2016-08-24 MED ORDER — FUROSEMIDE 40 MG PO TABS: 40.0000 mg | ORAL_TABLET | Freq: Two times a day (BID) | ORAL | 0 refills | Status: DC

## 2016-08-24 MED ORDER — HYDRALAZINE HCL 20 MG/ML IJ SOLN
5.0000 mg | Freq: Four times a day (QID) | INTRAMUSCULAR | Status: DC | PRN
  Administered 2016-08-24: 5 mg via INTRAVENOUS
  Filled 2016-08-24: qty 1

## 2016-08-24 MED ORDER — ISOSORBIDE DINITRATE 20 MG PO TABS
20.0000 mg | ORAL_TABLET | Freq: Three times a day (TID) | ORAL | Status: DC
  Administered 2016-08-25 – 2016-08-26 (×6): 20 mg via ORAL
  Filled 2016-08-24 (×7): qty 1

## 2016-08-24 MED ORDER — PIPERACILLIN-TAZOBACTAM 3.375 G IVPB 30 MIN
3.3750 g | Freq: Once | INTRAVENOUS | Status: AC
  Administered 2016-08-24: 3.375 g via INTRAVENOUS
  Filled 2016-08-24: qty 50

## 2016-08-24 MED ORDER — ACETAMINOPHEN 325 MG PO TABS
650.0000 mg | ORAL_TABLET | Freq: Four times a day (QID) | ORAL | Status: DC | PRN
  Administered 2016-08-25: 650 mg via ORAL
  Filled 2016-08-24: qty 2

## 2016-08-24 MED ORDER — DORZOLAMIDE HCL 2 % OP SOLN
1.0000 [drp] | Freq: Two times a day (BID) | OPHTHALMIC | Status: DC
  Administered 2016-08-25 – 2016-08-26 (×4): 1 [drp] via OPHTHALMIC
  Filled 2016-08-24: qty 10

## 2016-08-24 MED ORDER — INSULIN ASPART 100 UNIT/ML ~~LOC~~ SOLN
0.0000 [IU] | Freq: Every day | SUBCUTANEOUS | Status: DC
Start: 2016-08-24 — End: 2016-08-26

## 2016-08-24 MED ORDER — ONDANSETRON HCL 4 MG PO TABS: 4.0000 mg | ORAL_TABLET | Freq: Four times a day (QID) | ORAL | Status: DC | PRN

## 2016-08-24 MED ORDER — SERTRALINE HCL 50 MG PO TABS
50.0000 mg | ORAL_TABLET | Freq: Every day | ORAL | Status: DC
  Administered 2016-08-25 – 2016-08-26 (×2): 50 mg via ORAL
  Filled 2016-08-24 (×2): qty 1

## 2016-08-24 MED ORDER — CLONIDINE HCL 0.1 MG PO TABS
0.1000 mg | ORAL_TABLET | Freq: Two times a day (BID) | ORAL | Status: DC
  Administered 2016-08-25 – 2016-08-26 (×4): 0.1 mg via ORAL
  Filled 2016-08-24 (×4): qty 1

## 2016-08-24 MED ORDER — MIRTAZAPINE 15 MG PO TABS
15.0000 mg | ORAL_TABLET | Freq: Every day | ORAL | Status: DC
  Administered 2016-08-25 (×2): 15 mg via ORAL
  Filled 2016-08-24 (×2): qty 1

## 2016-08-24 MED ORDER — CLOPIDOGREL BISULFATE 75 MG PO TABS
75.0000 mg | ORAL_TABLET | Freq: Every day | ORAL | Status: DC
  Administered 2016-08-25 – 2016-08-26 (×2): 75 mg via ORAL
  Filled 2016-08-24 (×2): qty 1

## 2016-08-24 MED ORDER — ONDANSETRON HCL 4 MG/2ML IJ SOLN: 4.0000 mg | Freq: Four times a day (QID) | INTRAMUSCULAR | Status: DC | PRN

## 2016-08-24 MED ORDER — PREDNISOLONE ACETATE 1 % OP SUSP
1.0000 [drp] | Freq: Three times a day (TID) | OPHTHALMIC | Status: DC
  Administered 2016-08-25 – 2016-08-26 (×8): 1 [drp] via OPHTHALMIC
  Filled 2016-08-24: qty 1

## 2016-08-24 MED ORDER — INSULIN ASPART 100 UNIT/ML ~~LOC~~ SOLN
0.0000 [IU] | Freq: Three times a day (TID) | SUBCUTANEOUS | Status: DC
  Administered 2016-08-25: 2 [IU] via SUBCUTANEOUS
  Administered 2016-08-26: 1 [IU] via SUBCUTANEOUS
  Administered 2016-08-26: 2 [IU] via SUBCUTANEOUS

## 2016-08-24 MED ORDER — POLYVINYL ALCOHOL 1.4 % OP SOLN
1.0000 [drp] | Freq: Three times a day (TID) | OPHTHALMIC | Status: DC
  Administered 2016-08-25 – 2016-08-26 (×8): 1 [drp] via OPHTHALMIC
  Filled 2016-08-24: qty 15

## 2016-08-24 MED ORDER — DOXAZOSIN MESYLATE 2 MG PO TABS: 2.0000 mg | ORAL_TABLET | Freq: Every day | ORAL | 0 refills | Status: DC

## 2016-08-24 MED ORDER — ACETAMINOPHEN 650 MG RE SUPP: 650.0000 mg | Freq: Four times a day (QID) | RECTAL | Status: DC | PRN

## 2016-08-24 MED ORDER — DOXAZOSIN MESYLATE 2 MG PO TABS
2.0000 mg | ORAL_TABLET | Freq: Every day | ORAL | Status: DC
  Administered 2016-08-24: 2 mg via ORAL
  Filled 2016-08-24: qty 1

## 2016-08-24 MED ORDER — TIMOLOL MALEATE 0.5 % OP SOLN
1.0000 [drp] | Freq: Two times a day (BID) | OPHTHALMIC | Status: DC
  Administered 2016-08-25 – 2016-08-26 (×4): 1 [drp] via OPHTHALMIC
  Filled 2016-08-24: qty 5

## 2016-08-24 MED ORDER — HEPARIN SODIUM (PORCINE) 5000 UNIT/ML IJ SOLN
5000.0000 [IU] | Freq: Three times a day (TID) | INTRAMUSCULAR | Status: DC
  Administered 2016-08-25 – 2016-08-26 (×4): 5000 [IU] via SUBCUTANEOUS
  Filled 2016-08-24 (×3): qty 1

## 2016-08-24 MED ORDER — ATORVASTATIN CALCIUM 80 MG PO TABS
80.0000 mg | ORAL_TABLET | Freq: Every day | ORAL | Status: DC
  Administered 2016-08-25 (×2): 80 mg via ORAL
  Filled 2016-08-24 (×2): qty 1

## 2016-08-24 MED ORDER — AMLODIPINE BESYLATE 10 MG PO TABS
10.0000 mg | ORAL_TABLET | Freq: Every day | ORAL | Status: DC
  Administered 2016-08-25 – 2016-08-26 (×2): 10 mg via ORAL
  Filled 2016-08-24 (×2): qty 1

## 2016-08-24 MED ORDER — DONEPEZIL HCL 5 MG PO TABS
5.0000 mg | ORAL_TABLET | Freq: Every day | ORAL | Status: DC
  Administered 2016-08-25 (×2): 5 mg via ORAL
  Filled 2016-08-24 (×2): qty 1

## 2016-08-24 MED ORDER — PANTOPRAZOLE SODIUM 20 MG PO TBEC
20.0000 mg | DELAYED_RELEASE_TABLET | Freq: Every day | ORAL | Status: DC
  Administered 2016-08-25 – 2016-08-26 (×2): 20 mg via ORAL
  Filled 2016-08-24 (×2): qty 1

## 2016-08-24 MED ORDER — POLYETHYLENE GLYCOL 3350 17 G PO PACK: 17.0000 g | PACK | Freq: Every day | ORAL | Status: DC | PRN

## 2016-08-24 MED ORDER — VANCOMYCIN HCL IN DEXTROSE 1-5 GM/200ML-% IV SOLN
1000.0000 mg | Freq: Once | INTRAVENOUS | Status: AC
  Administered 2016-08-24: 1000 mg via INTRAVENOUS
  Filled 2016-08-24: qty 200

## 2016-08-24 MED ORDER — LEVOTHYROXINE SODIUM 100 MCG PO TABS
100.0000 ug | ORAL_TABLET | Freq: Every day | ORAL | Status: DC
  Administered 2016-08-25 – 2016-08-26 (×2): 100 ug via ORAL
  Filled 2016-08-24 (×2): qty 1

## 2016-08-24 MED ORDER — VANCOMYCIN HCL IN DEXTROSE 1-5 GM/200ML-% IV SOLN: 1000.0000 mg | Freq: Once | INTRAVENOUS | Status: DC

## 2016-08-24 MED ORDER — FUROSEMIDE 40 MG PO TABS
40.0000 mg | ORAL_TABLET | Freq: Two times a day (BID) | ORAL | Status: DC
  Administered 2016-08-25 – 2016-08-26 (×4): 40 mg via ORAL
  Filled 2016-08-24 (×4): qty 1

## 2016-08-24 MED ORDER — DORZOLAMIDE HCL-TIMOLOL MAL 2-0.5 % OP SOLN
1.0000 [drp] | Freq: Two times a day (BID) | OPHTHALMIC | Status: DC
  Filled 2016-08-24: qty 10

## 2016-08-24 NOTE — Discharge Instructions (Signed)
Heart Failure °Heart failure is a condition in which the heart has trouble pumping blood because it has become weak or stiff. This means that the heart does not pump blood efficiently for the body to work well. For some people with heart failure, fluid may back up into the lungs and there may be swelling (edema) in the lower legs. Heart failure is usually a long-term (chronic) condition. It is important for you to take good care of yourself and follow the treatment plan from your health care provider. °What are the causes? °This condition is caused by some health problems, including: °· High blood pressure (hypertension). Hypertension causes the heart muscle to work harder than normal. High blood pressure eventually causes the heart to become stiff and weak. °· Coronary artery disease (CAD). CAD is the buildup of cholesterol and fat (plaques) in the arteries of the heart. °· Heart attack (myocardial infarction). Injured tissue, which is caused by the heart attack, does not contract as well and the heart's ability to pump blood is weakened. °· Abnormal heart valves. When the heart valves do not open and close properly, the heart muscle must pump harder to keep the blood flowing. °· Heart muscle disease (cardiomyopathy or myocarditis). Heart muscle disease is damage to the heart muscle from a variety of causes, such as drug or alcohol abuse, infections, or unknown causes. These can increase the risk of heart failure. °· Lung disease. When the lungs do not work properly, the heart must work harder. ° °What increases the risk? °Risk of heart failure increases as a person ages. This condition is also more likely to develop in people who: °· Are overweight. °· Are female. °· Smoke or chew tobacco. °· Abuse alcohol or illegal drugs. °· Have taken medicines that can damage the heart, such as chemotherapy drugs. °· Have diabetes. °? High blood sugar (glucose) is associated with high fat (lipid) levels in the blood. °? Diabetes  can also damage tiny blood vessels that carry nutrients to the heart muscle. °· Have abnormal heart rhythms. °· Have thyroid problems. °· Have low blood counts (anemia). ° °What are the signs or symptoms? °Symptoms of this condition include: °· Shortness of breath with activity, such as when climbing stairs. °· Persistent cough. °· Swelling of the feet, ankles, legs, or abdomen. °· Unexplained weight gain. °· Difficulty breathing when lying flat (orthopnea). °· Waking from sleep because of the need to sit up and get more air. °· Rapid heartbeat. °· Fatigue and loss of energy. °· Feeling light-headed, dizzy, or close to fainting. °· Loss of appetite. °· Nausea. °· Increased urination during the night (nocturia). °· Confusion. ° °How is this diagnosed? °This condition is diagnosed based on: °· Medical history, symptoms, and a physical exam. °· Diagnostic tests, which may include: °? Echocardiogram. °? Electrocardiogram (ECG). °? Chest X-ray. °? Blood tests. °? Exercise stress test. °? Radionuclide scans. °? Cardiac catheterization and angiogram. ° °How is this treated? °Treatment for this condition is aimed at managing the symptoms of heart failure. Medicines, behavioral changes, or other treatments may be necessary to treat heart failure. °Medicines °These may include: °· Angiotensin-converting enzyme (ACE) inhibitors. This type of medicine blocks the effects of a blood protein called angiotensin-converting enzyme. ACE inhibitors relax (dilate) the blood vessels and help to lower blood pressure. °· Angiotensin receptor blockers (ARBs). This type of medicine blocks the actions of a blood protein called angiotensin. ARBs dilate the blood vessels and help to lower blood pressure. °· Water   pills (diuretics). Diuretics cause the kidneys to remove salt and water from the blood. The extra fluid is removed through urination, leaving a lower volume of blood that the heart has to pump. °· Beta blockers. These improve heart  muscle strength and they prevent the heart from beating too quickly. °· Digoxin. This increases the force of the heartbeat. ° °Healthy behavior changes °These may include: °· Reaching and maintaining a healthy weight. °· Stopping smoking or chewing tobacco. °· Eating heart-healthy foods. °· Limiting or avoiding alcohol. °· Stopping use of street drugs (illegal drugs). °· Physical activity. ° °Other treatments °These may include: °· Surgery to open blocked coronary arteries or repair damaged heart valves. °· Placement of a biventricular pacemaker to improve heart muscle function (cardiac resynchronization therapy). This device paces both the right ventricle and left ventricle. °· Placement of a device to treat serious abnormal heart rhythms (implantable cardioverter defibrillator, or ICD). °· Placement of a device to improve the pumping ability of the heart (left ventricular assist device, or LVAD). °· Heart transplant. This can cure heart failure, and it is considered for certain patients who do not improve with other therapies. ° °Follow these instructions at home: °Medicines °· Take over-the-counter and prescription medicines only as told by your health care provider. Medicines are important in reducing the workload of your heart, slowing the progression of heart failure, and improving your symptoms. °? Do not stop taking your medicine unless your health care provider told you to do that. °? Do not skip any dose of medicine. °? Refill your prescriptions before you run out of medicine. You need your medicines every day. °Eating and drinking ° °· Eat heart-healthy foods. Talk with a dietitian to make an eating plan that is right for you. °? Choose foods that contain no trans fat and are low in saturated fat and cholesterol. Healthy choices include fresh or frozen fruits and vegetables, fish, lean meats, legumes, fat-free or low-fat dairy products, and whole-grain or high-fiber foods. °? Limit salt (sodium) if  directed by your health care provider. Sodium restriction may reduce symptoms of heart failure. Ask a dietitian to recommend heart-healthy seasonings. °? Use healthy cooking methods instead of frying. Healthy methods include roasting, grilling, broiling, baking, poaching, steaming, and stir-frying. °· Limit your fluid intake if directed by your health care provider. Fluid restriction may reduce symptoms of heart failure. °Lifestyle °· Stop smoking or using chewing tobacco. Nicotine and tobacco can damage your heart and your blood vessels. Do not use nicotine gum or patches before talking to your health care provider. °· Limit alcohol intake to no more than 1 drink per day for non-pregnant women and 2 drinks per day for men. One drink equals 12 oz of beer, 5 oz of wine, or 1½ oz of hard liquor. °? Drinking more than that is harmful to your heart. Tell your health care provider if you drink alcohol several times a week. °? Talk with your health care provider about whether any level of alcohol use is safe for you. °? If your heart has already been damaged by alcohol or you have severe heart failure, drinking alcohol should be stopped completely. °· Stop use of illegal drugs. °· Lose weight if directed by your health care provider. Weight loss may reduce symptoms of heart failure. °· Do moderate physical activity if directed by your health care provider. People who are elderly and people with severe heart failure should consult with a health care provider for physical activity recommendations. °  Monitor important information °· Weigh yourself every day. Keeping track of your weight daily helps you to notice excess fluid sooner. °? Weigh yourself every morning after you urinate and before you eat breakfast. °? Wear the same amount of clothing each time you weigh yourself. °? Record your daily weight. Provide your health care provider with your weight record. °· Monitor and record your blood pressure as told by your health  care provider. °· Check your pulse as told by your health care provider. °Dealing with extreme temperatures °· If the weather is extremely hot: °? Avoid vigorous physical activity. °? Use air conditioning or fans or seek a cooler location. °? Avoid caffeine and alcohol. °? Wear loose-fitting, lightweight, and light-colored clothing. °· If the weather is extremely cold: °? Avoid vigorous physical activity. °? Layer your clothes. °? Wear mittens or gloves, a hat, and a scarf when you go outside. °? Avoid alcohol. °General instructions °· Manage other health conditions such as hypertension, diabetes, thyroid disease, or abnormal heart rhythms as told by your health care provider. °· Learn to manage stress. If you need help to do this, ask your health care provider. °· Plan rest periods when fatigued. °· Get ongoing education and support as needed. °· Participate in or seek rehabilitation as needed to maintain or improve independence and quality of life. °· Stay up to date with immunizations. Keeping current on pneumococcal and influenza immunizations is especially important to prevent respiratory infections. °· Keep all follow-up visits as told by your health care provider. This is important. °Contact a health care provider if: °· You have a rapid weight gain. °· You have increasing shortness of breath that is unusual for you. °· You are unable to participate in your usual physical activities. °· You tire easily. °· You cough more than normal, especially with physical activity. °· You have any swelling or more swelling in areas such as your hands, feet, ankles, or abdomen. °· You are unable to sleep because it is hard to breathe. °· You feel like your heart is beating quickly (palpitations). °· You become dizzy or light-headed when you stand up. °Get help right away if: °· You have difficulty breathing. °· You notice or your family notices a change in your awareness, such as having trouble staying awake or having  difficulty with concentration. °· You have pain or discomfort in your chest. °· You have an episode of fainting (syncope). °This information is not intended to replace advice given to you by your health care provider. Make sure you discuss any questions you have with your health care provider. °Document Released: 08/21/2005 Document Revised: 04/25/2016 Document Reviewed: 03/15/2016 °[employer] Interactive Patient Education © 2017 [employer] ° °

## 2016-08-24 NOTE — Progress Notes (Signed)
PT Cancellation Note  Patient Details Name: Valentino HueDorcas Santoli MRN: [id number] DOB: 1945-11-06   Cancelled Treatment:    Reason Eval/Treat Not Completed: Patient declined, no reason specified. Pt refusing to participate in therapy session as she stated that she did not sleep at all last night and is too tired to participate. PT will continue to f/u with pt as appropriate.   Alessandra BevelsJennifer M Carma Dwiggins 08/24/2016, 12:52 PM Deborah ChalkJennifer Korey Arroyo, PT, DPT [phone]

## 2016-08-24 NOTE — H&P (Signed)
History and Physical  Patient Name: Taylor Padilla     ZOX:[id number]    DOB: 06/01/1946    DOA: 08/24/2016 PCP: No PCP Per Patient   Patient coming from: [facility], after being there 2hrs since discharge  Chief Complaint: Fever, SOB  HPI: Taylor Padilla is a 70 y.o. female with a past medical history significant for blindness, diastolic CHF, CAD s/p CABG in [id number], CKD for, baseline creatinine 4, anemia, IDDM, PAH, hypothyroidism and HTN who presents with fever and SOB from nursing home.  The patient was admitted one week ago for acute respiratory failure, hypoxia. She was treated with 5 days Levaquin for possible pneumonia, but ultimately was thought to be from acute on chronic diastolic CHF, she was diuresed with IV Lasix and seen by cardiology, did undergo 2 units PRBCs transfusion for anemia, then transitioned to oral Lasix 3 days ago, and appeared stable for discharge this morning, procalcitonin trending down from 0.47 --> 0.36 this morning.  She is wheelchair bound for years, but not on home O2 usually, and was discharged on new O2.  When she arrived back to [facility] this afternoon, she complained to staff there of shortness of breath, they obtained a temperature that was 101F, increased her O2 to 4L and sent her back to the ER.   To me, she states she came back because of shortness of breath.  EMS were told by staff that she was sent for fever.  She has generalized weakness, increased non-productive cough.  No coryza, sore throat.  No myalgias, rigors.  No dysuria, hematuria.  ED course: -Temp 99.77F, heart rate 50s (baseline), respirations 26, pulse ox normal on room air, BP 153/57 -Na 132, K 3.8, Cr 4.12 (baseline 3.99 at discharge, trending up over short term), WBC 7.1K, Hgb 8.7 -Troponin negative -Lactic acid normal -CXR was read by radiology as new patchy mid left opacity -ECG showed no changes -She was given vancomycin and Zosyn for presumed HCAP and TRH were asked to  evaluate           ROS: Review of Systems  Constitutional: Positive for fever.  Respiratory: Positive for shortness of breath.   All other systems reviewed and are negative.         Past Medical History:  Diagnosis Date  . Blind in both eyes    a. legally blind since 2005 per pt.  Marland Kitchen. CAD (coronary artery disease)    a. 01/2002 s/p CABG x 4.  . Chronic diastolic CHF (congestive heart failure) (HCC)    a. 08/2016 Echo: EF 50-55%, no rwma, mild MS, mod MR, sev dil LA, nl RV fxn, mod TR, PASP 83mmHg.  . CKD (chronic kidney disease), stage IV (HCC)   . GERD (gastroesophageal reflux disease)   . Glaucoma, both eyes    a. legally blind.  . High cholesterol   . History of blood transfusion 2003   "related to OHS"  . Hypertensive heart disease   . Hypothyroidism   . Macular edema    hx  . Normocytic anemia    a. 08/2016 s/p 2u PRBCs for H/H 6/20.  Marland Kitchen. PAH (pulmonary artery hypertension)    a. 08/2016 Echo: PASP 83mmHg.  Marland Kitchen. Pneumonia 1990's X 1  . Stroke [facility](HCC)    "I've been told I've had one; I don't remember having any reaction from it at all"; denies residual on 10/08/2014  . Type II diabetes mellitus (HCC)   . Valvular heart disease    a. 08/2016  Echo: Mild MS, Mod MR/TR.    Past Surgical History:  Procedure Laterality Date  . CATARACT EXTRACTION W/ INTRAOCULAR LENS  IMPLANT, BILATERAL Bilateral   . CORONARY ARTERY BYPASS GRAFT  01/2002   "CABG X4" at [facility]    Social History: Patient lives at [facility].  The patient is wheelchair bound for the last few years.  From [place].  Has a brother in area, husband deceased, no children.  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  Never smoker.    Allergies  Allergen Reactions  . Epinephrine Other (See Comments)    Patient says she is not allergic to epi, but that it makes her jittery.  WILL RECEIVE IF NEEDED IN AN EMERGENCY.    Family history: Mother and father deceased, no medical problems.  Prior to Admission medications     Medication Sig Start Date End Date Taking? Authorizing Provider  acetaminophen (TYLENOL) 500 MG tablet Take 500 mg by mouth every 4 (four) hours as needed for fever (pain).    Yes Historical Provider, MD  amLODipine (NORVASC) 10 MG tablet Take 10 mg by mouth daily. 09/27/14  Yes Historical Provider, MD  atorvastatin (LIPITOR) 80 MG tablet Take 80 mg by mouth at bedtime. 09/21/14  Yes Historical Provider, MD  Calcium & Magnesium Carbonates (MYLANTA PO) Take 30 mLs by mouth daily as needed (indigestion).   Yes Historical Provider, MD  Cholecalciferol (VITAMIN D3) 50000 units CAPS Take 50,000 Units by mouth every 30 (thirty) days. Take 1 capsule (50,000 units) by mouth on the 10th of each month   Yes Historical Provider, MD  cloNIDine (CATAPRES) 0.1 MG tablet Take 1 tablet (0.1 mg total) by mouth 2 (two) times daily. 8am, 4pm Would stop this on 08/28/2016. 08/24/16  Yes Maryann Mikhail, DO  clopidogrel (PLAVIX) 75 MG tablet Take 75 mg by mouth daily. 09/24/14  Yes Historical Provider, MD  donepezil (ARICEPT) 5 MG tablet Take 5 mg by mouth at bedtime.   Yes Historical Provider, MD  dorzolamide-timolol (COSOPT) 22.3-6.8 MG/ML ophthalmic solution Place 1 drop into both eyes every 12 (twelve) hours. 10/06/14  Yes Historical Provider, MD  hydrALAZINE (APRESOLINE) 100 MG tablet Take 1 tablet (100 mg total) by mouth every 8 (eight) hours. 11/03/14  Yes Joseph Art, DO  insulin aspart (NOVOLOG) 100 UNIT/ML injection Inject 2-12 Units into the skin 4 (four) times daily. Per sliding scale: CBG 151-200 2 units, 201-250 4 units, 251-300 6 units, 301-350 8 units, 351-400 10 units, 401-450 12 units, Call MD for CBG >451 or <60   Yes Historical Provider, MD  isosorbide dinitrate (ISORDIL) 20 MG tablet Take 20 mg by mouth 3 (three) times daily. 9am, 1pm, 5pm   Yes Historical Provider, MD  latanoprost (XALATAN) 0.005 % ophthalmic solution Place 1 drop into the right eye at bedtime.   Yes Historical Provider, MD   levothyroxine (SYNTHROID, LEVOTHROID) 100 MCG tablet Take 1 tablet (100 mcg total) by mouth daily before breakfast. 11/26/14  Yes Renae Fickle, MD  loperamide (IMODIUM A-D) 2 MG tablet Take 2 mg by mouth every 3 (three) hours as needed for diarrhea or loose stools. Maximum 8 tablets in 24 hours   Yes Historical Provider, MD  Magnesium Hydroxide (MILK OF MAGNESIA PO) Take 30 mLs by mouth at bedtime as needed (constipation).   Yes Historical Provider, MD  Melatonin 3 MG TABS Take 3 mg by mouth at bedtime.   Yes Historical Provider, MD  mirtazapine (REMERON) 15 MG tablet Take 15 mg by  mouth at bedtime. For appetite   Yes Historical Provider, MD  Nutritional Supplements (NUTRITIONAL SUPPLEMENT PO) Take 90 mLs by mouth 3 (three) times daily. Med Pass   Yes Historical Provider, MD  ondansetron (ZOFRAN) 4 MG tablet Take 4 mg by mouth every 6 (six) hours as needed for nausea or vomiting.   Yes Historical Provider, MD  OXYGEN Inhale 2 L into the lungs See admin instructions. "Apply 2 liters per minute via nasal canula for shortness of breath"   Yes Historical Provider, MD  pantoprazole (PROTONIX) 20 MG tablet Take 20 mg by mouth daily at 6 (six) AM.    Yes Historical Provider, MD  polyvinyl alcohol (LIQUIFILM TEARS) 1.4 % ophthalmic solution Place 1 drop into the left eye 4 (four) times daily.   Yes Historical Provider, MD  prednisoLONE acetate (PRED FORTE) 1 % ophthalmic suspension Place 1 drop into the left eye 4 (four) times daily. 09/27/14  Yes Historical Provider, MD  sertraline (ZOLOFT) 50 MG tablet Take 50 mg by mouth daily.   Yes Historical Provider, MD  doxazosin (CARDURA) 2 MG tablet Take 1 tablet (2 mg total) by mouth daily. Patient not taking: Reported on 08/24/2016 08/24/16   Nita SellsMaryann Mikhail, DO  furosemide (LASIX) 40 MG tablet Take 1 tablet (40 mg total) by mouth 2 (two) times daily. Patient not taking: Reported on 08/24/2016 08/24/16   Edsel PetrinMaryann Mikhail, DO       Physical Exam: BP (!)  152/53   Pulse (!) 48   Temp 99.8 F (37.7 C) (Oral)   Resp 24   Ht 5\' 1"  (1.549 m)   Wt 62.1 kg (137 lb)   SpO2 99%   BMI 25.89 kg/m  General appearance: Frail elderly adult female, alert and in no acute distress.   Eyes: Anicteric, conjunctiva pink, lids and lashes normal. [place], blind.   ENT: No nasal deformity, discharge, epistaxis.  Hearing normal. OP tacky dry without lesions.   Neck: No neck masses.  Trachea midline.  No thyromegaly/tenderness. Lymph: No cervical or supraclavicular lymphadenopathy. Skin: Warm and dry.  No jaundice.  No suspicious rashes or lesions. Cardiac: RRR, nl S1-S2, soft brief SEM.  Capillary refill is brisk.  No JVD.  No LE edema.  Radial and DP pulses 2+ and symmetric. Respiratory: Normal respiratory rate and rhythm.  CTAB without rales or wheezes. Abdomen: Abdomen soft.  No TTP. No ascites, distension, hepatosplenomegaly.   MSK: No deformities or effusions.  No cyanosis or clubbing. Neuro: Face symmetric, tongue midline.  Sensation intact to light touch. Speech is fluent.  Muscle strength globally weak but symmetric.    Psych: Sensorium intact and responding to questions, attention normal.  Behavior appropriate.  Affect normal.  Judgment and insight appear normal.     Labs on Admission:  I have personally reviewed following labs and imaging studies: CBC:  Recent Labs Lab 08/18/16 1025  08/19/16 0928 08/20/16 0417 08/23/16 0410 08/24/16 0458 08/24/16 1740  WBC 11.0*  < > 5.9 7.5 4.9 7.6 7.1  NEUTROABS 9.4*  --   --  6.1  --   --  6.0  HGB 8.2*  < > 6.5* 9.6* 9.6* 9.2* 8.7*  HCT 27.0*  < > 21.0* 29.9* 30.5* 28.9* 27.1*  MCV 92.8  < > 90.5 90.9 93.3 92.6 90.0  PLT 401*  < > 255 253 251 226 228  < > = values in this interval not displayed. Basic Metabolic Panel:  Recent Labs Lab 08/21/16 0552 08/22/16 0332 08/23/16 0410  08/24/16 0458 08/24/16 1740  NA 135 136 138 137 132*  K 3.5 3.0* 3.9 3.9 3.8  CL 101 102 104 101 97*  CO2 21*  24 24 24 24   GLUCOSE 108* 126* 127* 86 110*  BUN 46* 44* 44* 45* 48*  CREATININE 3.51* 3.58* 3.82* 3.99* 4.12*  CALCIUM 8.6* 8.2* 8.4* 8.3* 8.2*  MG  --   --  2.0  --   --    GFR: Estimated Creatinine Clearance: 10.6 mL/min (by C-G formula based on SCr of 4.12 mg/dL (H)).  Liver Function Tests:  Recent Labs Lab 08/18/16 1025 08/19/16 0400 08/20/16 0417 08/24/16 1740  AST 18 15 16 21   ALT 9* 10* 10* 10*  ALKPHOS 81 62 69 62  BILITOT 0.8 0.7 1.0 0.7  PROT 7.1 5.9* 6.3* 6.0*  ALBUMIN 3.4* 2.6* 2.9* 2.9*   No results for input(s): LIPASE, AMYLASE in the last 168 hours. No results for input(s): AMMONIA in the last 168 hours. Coagulation Profile: No results for input(s): INR, PROTIME in the last 168 hours. Cardiac Enzymes:  Recent Labs Lab 08/18/16 1615 08/18/16 2145 08/19/16 0400  TROPONINI 0.04* 0.05* 0.06*   BNP (last 3 results) No results for input(s): PROBNP in the last 8760 hours. HbA1C: No results for input(s): HGBA1C in the last 72 hours. CBG:  Recent Labs Lab 08/23/16 1238 08/23/16 1623 08/23/16 2111 08/24/16 0703 08/24/16 1133  GLUCAP 143* 103* 103* 100* 123*   Lipid Profile: No results for input(s): CHOL, HDL, LDLCALC, TRIG, CHOLHDL, LDLDIRECT in the last 72 hours. Thyroid Function Tests: No results for input(s): TSH, T4TOTAL, FREET4, T3FREE, THYROIDAB in the last 72 hours. Anemia Panel: No results for input(s): VITAMINB12, FOLATE, FERRITIN, TIBC, IRON, RETICCTPCT in the last 72 hours. Sepsis Labs: Lactate normal Invalid input(s): PROCALCITONIN, LACTICIDVEN Recent Results (from the past 240 hour(s))  MRSA PCR Screening     Status: Abnormal   Collection Time: 08/18/16  2:36 PM  Result Value Ref Range Status   MRSA by PCR POSITIVE (A) NEGATIVE Final    Comment:        The GeneXpert MRSA Assay (FDA approved for NASAL specimens only), is one component of a comprehensive MRSA colonization surveillance program. It is not intended to diagnose  MRSA infection nor to guide or monitor treatment for MRSA infections. RESULT CALLED TO, READ BACK BY AND VERIFIED WITH: Yevonne Aline RN 16:20 08/18/16 ([email or web address])   Culture, blood (Routine X 2) w Reflex to ID Panel     Status: None   Collection Time: 08/18/16  4:15 PM  Result Value Ref Range Status   Specimen Description BLOOD LEFT ANTECUBITAL  Final   Special Requests IN PEDIATRIC BOTTLE 2CC  Final   Culture NO GROWTH 5 DAYS  Final   Report Status 08/23/2016 FINAL  Final  Culture, blood (Routine X 2) w Reflex to ID Panel     Status: None   Collection Time: 08/18/16  4:21 PM  Result Value Ref Range Status   Specimen Description BLOOD RIGHT HAND  Final   Special Requests BOTTLES DRAWN AEROBIC ONLY 5CC  Final   Culture NO GROWTH 5 DAYS  Final   Report Status 08/23/2016 FINAL  Final         Radiological Exams on Admission: Personally reviewed CXR shows patchy bilateral opacities.  These are not significantly different from previous CXR at the time of last admission, which was more clearly atelectasis; there is no correlate on lung exam: Dg Chest [facility]  Result Date: 08/24/2016 CLINICAL DATA:  Shortness of breath x2 days EXAM: PORTABLE CHEST 1 VIEW COMPARISON:  08/19/2016 FINDINGS: Mild patchy right lower lobe opacity, suspicious for pneumonia. Mild left basilar opacity, likely atelectasis. No pleural effusion or pneumothorax. Cardiomegaly.  Postsurgical changes related to prior CABG. IMPRESSION: Mild patchy right lower lobe opacity, suspicious for pneumonia. Electronically Signed   By: Charline Bills M.D.   On: 08/24/2016 17:56    EKG: Independently reviewed. Rate 49, QTc 438, no change from previous.  Echocardiogram last admission: 50-55% PAP 83           Assessment/Plan  1. Fever:  Was asked to see the patient because of new fever.  While in the hospital, had been treated with Levaquin for presumed CAP because of equivocal CXR and hypoxia.  She got 5d Levaquin, which  seems adequate, and her procalcitonin was low and trending down at the time of discharge TODAY, militating strongly against that this is bacterial pneumonia.  Other causes of fever are atelectasis, new URI, UTI.  Doubt drug fever, not on any new Abx for last few days. -Observe off antibiotics -Culture for fever  -Incentive spirometry -Obtain UA and culture if pyuria -Obtain sputum culture and gram stain -Follow flu swab and RVP    2. Shortness of breath:  This is chronic.  At her facility, she was put on 4L O2, but on arrival here, she was saturating in 95-100% on room air, except briefly while eating.  On my arrival to room, she was off O2 and saturating well without respiratory distress and her lung exam is benign.   She has advanced heart failure and pulmonary hypertension and was just diuresed under the direction of Cardiology, doubt acute CHF.   Anemia is only slightly lower than post-transfusion, doubt this is symptomatic anemia. -Monitor off O2 overnight with spot O2 checks  3. HTN and CAD secondary prevention:  -Continue amlodipine, Imdur, hydralazine, clonidine -Continue new doxazosin -Continue statin and Plavix  4. Chronic disatolic CHF:  EF 50-55%.   -Continue furosemide BID  5. Normocytic anemia:  Slight trend down.  Anemia not addressed by Neph during last hospitalization, but given 2U PRBCs for Hgb 6.4 (bumped to 9.6, down to 9.2 at discharge, 8.7 today).   -Trend CBC -Check iron stores, B12 stores  6. Hyponatremia:  In setting of CHF.  Mild. -Trend BMP  7. IDDM:  -Hold home insulin -Low dose SSI with meals  8. CKD IV: Recent baseline mid-to-high 3s expected per Neph.  Previous baseline 2.2.  Not good dialysis candidate.  No significant fluid OL, uremia, acidosis -Daily BMP  9. Hypothyroidism: -Continue levothyroxine  10. Other medications: -Continue donepezil -Continue eye drops -Continue sertraline and mirtazapine -Continue nutrition  supplements        DVT prophylaxis: Heparin  Code Status: DO NOT RESUSCITATE  Family Communication: None  Disposition Plan: Anticipate observe off antibiotics overnight.  If no fever and lab work reassuring, discharge tomorrow. Consults called: None Admission status: OBS At the point of initial evaluation, it is my clinical opinion that admission for OBSERVATION is reasonable and necessary because the patient's presenting complaints in the context of their chronic conditions represent sufficient risk of deterioration or significant morbidity to constitute reasonable grounds for close observation in the hospital setting, but that the patient may be medically stable for discharge from the hospital within 24 to 48 hours.    Medical decision making: Patient seen at 8:15 PM on 08/24/2016.  The patient was  discussed with Dr. Ranae Palms.  What exists of the patient's chart was reviewed in depth and summarized above.  Clinical condition: stable.        Alberteen Sam [facility] Pager [phone]

## 2016-08-24 NOTE — Progress Notes (Addendum)
Hydralazine 5 mg ordered and given.  Will continue to monitor.   Chanze Teagle, RN  

## 2016-08-24 NOTE — Progress Notes (Signed)
Patient has order to discharge to [facility]. IV and telemetry removed. Report called to [facility]. Patient placed in transport gown. Patient transferred via [facility] with belongings and paperwork.

## 2016-08-24 NOTE — Discharge Summary (Addendum)
Physician Discharge Summary  Taylor Padilla ZOX:[id number] DOB: [birth year] DOA: 08/18/2016  PCP: No PCP Per Patient  Admit date: 08/18/2016 Discharge date: 08/24/2016  Time spent: 45 minutes  Recommendations for Outpatient Follow-up:  Patient will be discharged to nursing facility.  Patient will need to follow up with primary care provider (at the nursing facility) within one week of discharge, repeat CBC and BMP.  Continue to follow up with Dr. Gala Romney, cardiology.   Patient should continue medications as prescribed.  Patient should follow a Renal/carb modified diet with Fluid restriction per day.  Discharge Diagnoses:  Acute Respiratory failure with hypoxia Acute on chronic diastolic heart failure Anemia of chronic disease Acute on chronic kidney disease, stage III Coronary artery disease status post CABG/elevated troponin Hypertension/hypertensive heart disease Hyperlipidemia Hypothyroidism Diabetes mellitus, type II GERD Deconditioning Depression  Discharge Condition: Stable  Diet recommendation: Renal/carb modified  Filed Weights   08/22/16 0529 08/23/16 0607 08/24/16 0636  Weight: 62.6 kg (138 lb) 59.9 kg (132 lb) 59.2 kg (130 lb 9.6 oz)    History of present illness:  On 08/18/2016 by Dr. Tonye Royalty Taylor Padilla is a 70 y.o. female with extensive  medical history including diastolic CHF, hypertension, CAD status post MI, diabetes, history of CVA, presenting to the emergency department with sudden onset of shortness of breath since this morning. The patient reported orthopnea, but not paroxysmal nocturnal dyspnea. She denies any chest pain or palpitations. She denies any history of DVT or PE. She denies any recent long distance trips, or hormonal replacement she denies any changes in her medications, and she is compliant with them. Last doses of her medications were given today. She denies any fever, however she reports chills today, feeling very cold.  She is noted aware of any sick contacts, but she does live in a nursing home. She is very sedentary. She denies any nausea, vomiting, abdominal pain, appetite changes, dysuria, hematuria, lower extremity swelling. She denies any headaches, or confusion. She is legally blind.  On transit,   she was on NRB, later placed on CPAP. She was in 6 L, and now she is on 2.5 L of nasal cannula. The patient is not on oxygen at home. On transit, the patient received albuterol, nitro, and Atrovent nebulizers, with improvement of her symptoms   Hospital Course:  Acute Respiratory failure with hypoxia -Likely related to CHF -Initial x-ray showed CHF, bibasilar airspace disease, likely atelectasis versus infection -Lactic acid was normal -Continue supplemental oxygen to maintain sats above 92% -D-dimer positive but VQ scan showed no signs of PE -Lower extremity Dopplers negative for DVT -X-ray on 08/19/2016 showed improvement -Influenza A, blood cultures negative -patient was placed on Levaquin, had 5 days worth  Acute on chronic diastolic heart failure -Cardiology consultation appreciated -Currently on oral Lasix (switched 08/22/2016) -Echocardiogram 08/19/2016 showed an EF of 50-55%, no RWA A, PASP 83, moderate TR, MR -Continue to monitor intake and output, daily weights  Anemia of chronic disease -Given 2 u PRBCs this admission, 08/19/2016 -hemoglobin 9.2 today -FOBT ordered however no stools at this time -Repeat CBC in one week  Acute on chronic kidney disease, stage III -Baseline creatinine 2.2 2.4, currently 3.99 -Nephrology consulted and appreciated, patient not hemodialysis candidate -Repeat BMP in one week  Coronary artery disease status post CABG/elevated troponin -Cardiology consulted and appreciated -Likely secondary to CHF -No complaints of chest pain -Continue Plavix 75 mg daily  Hypertension/hypertensive heart disease -Clonidine decreased - plan to stop in 3-4 days (spoke  with Dr. Jens Somrenshaw, plan to stop clonidine and start cardura, cardura may need to be titrated) -Patient mildly bradycardic, avoid beta blockers -Continue amlodipine, Lasix, Isordil, hydralazine -Cardura 2mg  added  Hyperlipidemia -Continue statin  Hypothyroidism -TSH normal, continue Synthroid  Diabetes mellitus, type II -Hemoglobin A1c 5.8 -Home medications held- restart discharge  GERD -Continue PPI  Deconditioning -PT OT recommended SNF  Depression -Paxil discontinued -Continue Zoloft and Remeron  Code status: DNR  Consultants Cardiology Nephrology   Procedures  Echocardiogram LE doppler  Discharge Exam: Vitals:   08/24/16 0013 08/24/16 0636  BP: (!) 174/50 (!) 169/49  Pulse:  (!) 57  Resp:  18  Temp:  99.7 F (37.6 C)   Does feel breathing has improved, not back to baseline. Denies chest pain, headache, dizziness, abdominal pain, nausea, vomiting, diarrhea.    Exam  General: Well developed, well nourished, NAD  HEENT: NCAT, mucous membranes moist.  Eyes closed.  Cardiovascular: S1 S2 auscultated, Regular rate and rhythm.  Respiratory: diminished, but clear  Abdomen: Soft, nontender, nondistended, + bowel sounds  Extremities: warm dry without cyanosis clubbing or edema  Neuro: AAOx3, nonfocal  Psych: Normal affect and demeanor  Discharge Instructions Discharge Instructions    Discharge instructions    Complete by:  As directed    Patient will be discharged to nursing facility.  Patient will need to follow up with primary care provider (at the nursing facility) within one week of discharge, repeat CBC and BMP.  Continue to follow up with Dr. Gala RomneyBensimhon, cardiology.   Patient should continue medications as prescribed.  Patient should follow a Renal/carb modified diet with 1200ml Fluid restriction per day.     Current Discharge Medication List    START taking these medications   Details  doxazosin (CARDURA) 2 MG tablet Take 1 tablet (2 mg  total) by mouth daily. Qty: 30 tablet, Refills: 0      CONTINUE these medications which have CHANGED   Details  cloNIDine (CATAPRES) 0.1 MG tablet Take 1 tablet (0.1 mg total) by mouth 2 (two) times daily. 8am, 4pm Would stop this on 08/28/2016. Qty: 8 tablet, Refills: 0    furosemide (LASIX) 40 MG tablet Take 1 tablet (40 mg total) by mouth 2 (two) times daily. Qty: 60 tablet, Refills: 0      CONTINUE these medications which have NOT CHANGED   Details  acetaminophen (TYLENOL) 500 MG tablet Take 500 mg by mouth every 4 (four) hours as needed for fever (pain).     amLODipine (NORVASC) 10 MG tablet Take 10 mg by mouth daily.    atorvastatin (LIPITOR) 80 MG tablet Take 80 mg by mouth at bedtime.    Calcium & Magnesium Carbonates (MYLANTA PO) Take 30 mLs by mouth daily as needed (indigestion).    Cholecalciferol (VITAMIN D3) 50000 units CAPS Take 50,000 Units by mouth every 30 (thirty) days. Take 1 capsule (50,000 units) by mouth on the 10th of each month    clopidogrel (PLAVIX) 75 MG tablet Take 75 mg by mouth daily.    donepezil (ARICEPT) 5 MG tablet Take 5 mg by mouth at bedtime.    dorzolamide-timolol (COSOPT) 22.3-6.8 MG/ML ophthalmic solution Place 1 drop into both eyes every 12 (twelve) hours.    hydrALAZINE (APRESOLINE) 100 MG tablet Take 1 tablet (100 mg total) by mouth every 8 (eight) hours. Qty: 90 tablet, Refills: 0    insulin aspart (NOVOLOG) 100 UNIT/ML injection Inject 2-12 Units into the skin 4 (four) times daily. Per sliding scale:  CBG 151-200 2 units, 201-250 4 units, 251-300 6 units, 301-350 8 units, 351-400 10 units, 401-450 12 units, Call MD for CBG >451 or <60    isosorbide dinitrate (ISORDIL) 20 MG tablet Take 20 mg by mouth 3 (three) times daily. 9am, 1pm, 5pm    latanoprost (XALATAN) 0.005 % ophthalmic solution Place 1 drop into the right eye at bedtime.    levothyroxine (SYNTHROID, LEVOTHROID) 100 MCG tablet Take 1 tablet (100 mcg total) by mouth daily  before breakfast. Qty: 30 tablet, Refills: 0    loperamide (IMODIUM A-D) 2 MG tablet Take 2 mg by mouth every 3 (three) hours as needed for diarrhea or loose stools. Maximum 8 tablets in 24 hours    Magnesium Hydroxide (MILK OF MAGNESIA PO) Take 30 mLs by mouth at bedtime as needed (constipation).    Melatonin 3 MG TABS Take 3 mg by mouth at bedtime.    mirtazapine (REMERON) 15 MG tablet Take 15 mg by mouth at bedtime. For appetite    Nutritional Supplements (NUTRITIONAL SUPPLEMENT PO) Take 90 mLs by mouth 3 (three) times daily. Med Pass    ondansetron (ZOFRAN) 4 MG tablet Take 4 mg by mouth every 6 (six) hours as needed for nausea or vomiting.    OXYGEN Inhale 2 L into the lungs See admin instructions. "Apply 2 liters per minute via nasal canula for shortness of breath"    pantoprazole (PROTONIX) 20 MG tablet Take 20 mg by mouth daily at 6 (six) AM.     polyvinyl alcohol (LIQUIFILM TEARS) 1.4 % ophthalmic solution Place 1 drop into the left eye 4 (four) times daily.    prednisoLONE acetate (PRED FORTE) 1 % ophthalmic suspension Place 1 drop into the left eye 4 (four) times daily.    sertraline (ZOLOFT) 50 MG tablet Take 50 mg by mouth daily.      STOP taking these medications     dronabinol (MARINOL) 2.5 MG capsule      Melatonin 10 MG CAPS        Allergies  Allergen Reactions  . Epinephrine Other (See Comments)    Patient says she is not allergic to epi, but that it makes her jittery.  WILL RECEIVE IF NEEDED IN AN EMERGENCY.    Contact information for follow-up providers    Bensimhon, Daniel, MD. Schedule an appointment as soon as possible for a visit in 1 week(s).   Specialty:  Cardiology Why:  [facility] follow up Contact information: [address] [place] [address] [phone]            Contact information for after-discharge care    Destination    [facility] Follow up.   Specialty:  Skilled Nursing  Facility Contact information: [address] [place] [address] [phone]                   The results of significant diagnostics from this hospitalization (including imaging, microbiology, ancillary and laboratory) are listed below for reference.    Significant Diagnostic Studies: Dg Chest 2 View  Result Date: 08/19/2016 CLINICAL DATA:  [facility]- pt coming from [facility] nursing facility. Initially resp distress, on NRB at 98%. Pt on CPAP on on arrival. Hx of CHF, GERD, hypertension, stroke, myocardia infarction, and pneumonia. Hx of CABG in 2003. EXAM: CHEST - 2 VIEW COMPARISON:  08/18/2016 FINDINGS: Mild interstitial edema and central below vascular congestion, improved since previous. Small pleural effusions left greater than right, improved. Mild cardiomegaly.  Atheromatous aorta. Previous CABG. IMPRESSION: 1. Improving interstitial edema and pleural effusions. Electronically Signed   By: Corlis Leak  Hassell M.D.   On: 08/19/2016 13:54   Nm Pulmonary Perf And Vent  Result Date: 08/19/2016 CLINICAL DATA:  Respiratory failure EXAM: NUCLEAR MEDICINE VENTILATION - PERFUSION LUNG SCAN TECHNIQUE: Ventilation images were obtained in multiple projections using inhaled aerosol Tc-4673m DTPA. Perfusion images were obtained in multiple projections after intravenous injection of Tc-7673m MAA. RADIOPHARMACEUTICALS:  31.0 mCi Technetium-3673m DTPA aerosol inhalation and 4.3 mCi Technetium-9673m MAA IV COMPARISON:  Chest x-ray 08/18/2016 FINDINGS: Ventilation: Patchy ventilation defects throughout the lungs, best seen in the right lung Perfusion: No wedge shaped peripheral perfusion defects to suggest acute pulmonary embolism. IMPRESSION: No perfusion defects to suggest pulmonary embolus. Electronically Signed   By: Charlett NoseKevin  Dover M.D.   On: 08/19/2016 13:48   Dg Chest Portable 1 View  Result Date: 08/18/2016 CLINICAL DATA:  Shortness of breath. EXAM: PORTABLE CHEST 1 VIEW COMPARISON:   One-view chest x-ray 01/15/2016. FINDINGS: The heart size is upper limits of normal. This is exaggerated by low lung volumes. Moderate edema and bilateral pleural effusions are new. Bibasilar airspace disease likely reflects atelectasis. Atherosclerotic calcifications are again noted at the aortic arch. Degenerate changes are noted the shoulder and thoracic spine. The visualized soft tissues and bony thorax are otherwise unremarkable. IMPRESSION: 1. Congestive heart failure. 2. Bibasilar airspace disease likely reflects atelectasis. Infection is not excluded. 3. Aortic atherosclerosis. Electronically Signed   By: Marin Robertshristopher  Mattern M.D.   On: 08/18/2016 11:08    Microbiology: Recent Results (from the past 240 hour(s))  MRSA PCR Screening     Status: Abnormal   Collection Time: 08/18/16  2:36 PM  Result Value Ref Range Status   MRSA by PCR POSITIVE (A) NEGATIVE Final    Comment:        The GeneXpert MRSA Assay (FDA approved for NASAL specimens only), is one component of a comprehensive MRSA colonization surveillance program. It is not intended to diagnose MRSA infection nor to guide or monitor treatment for MRSA infections. RESULT CALLED TO, READ BACK BY AND VERIFIED WITH: Yevonne AlineL. King RN 16:20 08/18/16 ([email or web address])   Culture, blood (Routine X 2) w Reflex to ID Panel     Status: None   Collection Time: 08/18/16  4:15 PM  Result Value Ref Range Status   Specimen Description BLOOD LEFT ANTECUBITAL  Final   Special Requests IN PEDIATRIC BOTTLE 2CC  Final   Culture NO GROWTH 5 DAYS  Final   Report Status 08/23/2016 FINAL  Final  Culture, blood (Routine X 2) w Reflex to ID Panel     Status: None   Collection Time: 08/18/16  4:21 PM  Result Value Ref Range Status   Specimen Description BLOOD RIGHT HAND  Final   Special Requests BOTTLES DRAWN AEROBIC ONLY 5CC  Final   Culture NO GROWTH 5 DAYS  Final   Report Status 08/23/2016 FINAL  Final     Labs: Basic Metabolic Panel:  Recent Labs Lab  08/20/16 0417 08/21/16 0552 08/22/16 0332 08/23/16 0410 08/24/16 0458  NA 136 135 136 138 137  K 3.6 3.5 3.0* 3.9 3.9  CL 102 101 102 104 101  CO2 20* 21* 24 24 24   GLUCOSE 100* 108* 126* 127* 86  BUN 44* 46* 44* 44* 45*  CREATININE 3.48* 3.51* 3.58* 3.82* 3.99*  CALCIUM 8.7* 8.6* 8.2* 8.4* 8.3*  MG  --   --   --  2.0  --  Liver Function Tests:  Recent Labs Lab 08/18/16 1025 08/19/16 0400 08/20/16 0417  AST 18 15 16   ALT 9* 10* 10*  ALKPHOS 81 62 69  BILITOT 0.8 0.7 1.0  PROT 7.1 5.9* 6.3*  ALBUMIN 3.4* 2.6* 2.9*   No results for input(s): LIPASE, AMYLASE in the last 168 hours. No results for input(s): AMMONIA in the last 168 hours. CBC:  Recent Labs Lab 08/18/16 1025  08/19/16 0555 08/19/16 0928 08/20/16 0417 08/23/16 0410 08/24/16 0458  WBC 11.0*  --  6.1 5.9 7.5 4.9 7.6  NEUTROABS 9.4*  --   --   --  6.1  --   --   HGB 8.2*  < > 6.4* 6.5* 9.6* 9.6* 9.2*  HCT 27.0*  < > 20.2* 21.0* 29.9* 30.5* 28.9*  MCV 92.8  --  90.6 90.5 90.9 93.3 92.6  PLT 401*  --  269 255 253 251 226  < > = values in this interval not displayed. Cardiac Enzymes:  Recent Labs Lab 08/18/16 1615 08/18/16 2145 08/19/16 0400  TROPONINI 0.04* 0.05* 0.06*   BNP: BNP (last 3 results)  Recent Labs  01/15/16 2033 08/18/16 1615  BNP 169.8* 1,537.5*    ProBNP (last 3 results) No results for input(s): PROBNP in the last 8760 hours.  CBG:  Recent Labs Lab 08/23/16 0609 08/23/16 1238 08/23/16 1623 08/23/16 2111 08/24/16 0703  GLUCAP 120* 143* 103* 103* 100*       Signed:  Edsel Petrin  [facility] 08/24/2016, 11:25 AM

## 2016-08-24 NOTE — Progress Notes (Signed)
Patient Name: Taylor Padilla Date of Encounter: 08/24/2016  Primary Cardiologist: Dr. Gala RomneyBensimhon (CHF clinic 16)  Hospital Problem List     Active Problems:   Diabetes mellitus type II, uncontrolled (HCC)   Hypertension   CAD (coronary artery disease)   CKD (chronic kidney disease) stage 4, GFR 15-29 ml/min (HCC)   Hypothyroidism   Dyslipidemia   GERD (gastroesophageal reflux disease)   Acute respiratory failure with hypoxia (HCC)   Anemia   Acute diastolic heart failure, NYHA class 1 (HCC)   CHF exacerbation (HCC)   Hypertensive heart disease   Valvular heart disease   Type II diabetes mellitus (HCC)   PAH (pulmonary artery hypertension)   Normocytic anemia   CHF (congestive heart failure) (HCC)   Acute on chronic diastolic congestive heart failure (HCC)   Acute respiratory failure (HCC)   Acute renal failure superimposed on stage 4 chronic kidney disease (HCC)     Subjective   Complains of dyspnea; no chest pain  Inpatient Medications    Scheduled Meds: . sodium chloride   Intravenous Once  . amLODipine  10 mg Oral Daily  . atorvastatin  80 mg Oral QHS  . cloNIDine  0.1 mg Oral BID  . clopidogrel  75 mg Oral Daily  . donepezil  5 mg Oral QHS  . dorzolamide  1 drop Both Eyes BID   And  . timolol  1 drop Both Eyes BID  . furosemide  40 mg Oral BID  . heparin subcutaneous  5,000 Units Subcutaneous Q8H  . hydrALAZINE  100 mg Oral Q8H  . insulin aspart  0-9 Units Subcutaneous TID WC  . isosorbide dinitrate  20 mg Oral TID  . latanoprost  1 drop Right Eye QHS  . levothyroxine  100 mcg Oral QAC breakfast  . mouth rinse  15 mL Mouth Rinse BID  . Melatonin  9 mg Oral QPM  . mirtazapine  15 mg Oral QHS  . pantoprazole  20 mg Oral Daily  . polyvinyl alcohol  1 drop Left Eye QID  . prednisoLONE acetate  1 drop Left Eye QID  . sertraline  50 mg Oral Daily  . sodium chloride flush  3 mL Intravenous Q12H  . [START ON 09/13/2016] Vitamin D (Ergocalciferol)  50,000  Units Oral Q30 days   Continuous Infusions:  PRN Meds: sodium chloride, acetaminophen, guaiFENesin-dextromethorphan, hydrALAZINE, ipratropium, ondansetron (ZOFRAN) IV, sodium chloride flush, technetium TC 75M diethylenetriame-pentaacetic acid   Vital Signs    Vitals:   08/23/16 1200 08/23/16 2113 08/24/16 0013 08/24/16 0636  BP: (!) 149/41 (!) 178/83 (!) 174/50 (!) 169/49  Pulse: (!) 42 (!) 56  (!) 57  Resp: 18 18  18   Temp: 98.3 F (36.8 C) 99.1 F (37.3 C)  99.7 F (37.6 C)  TempSrc: Oral Oral  Oral  SpO2: 97% 95%  97%  Weight:    130 lb 9.6 oz (59.2 kg)  Height:        Intake/Output Summary (Last 24 hours) at 08/24/16 1050 Last data filed at 08/24/16 0938  Gross per 24 hour  Intake              481 ml  Output              350 ml  Net              131 ml   Filed Weights   08/22/16 0529 08/23/16 0607 08/24/16 0636  Weight: 138 lb (62.6 kg) 132 lb (59.9  kg) 130 lb 9.6 oz (59.2 kg)    Physical Exam    GEN: Pleasant older female, in no acute distress.  HEENT: Grossly normal.  Neck: Supple Cardiac: RRR + JVD Respiratory:  CTA  GI: Soft, nontender, nondistended MS: no deformity or atrophy. Skin: warm and dry, no rash. Neuro:  Strength and sensation are intact.   Labs    CBC  Recent Labs  08/23/16 0410 08/24/16 0458  WBC 4.9 7.6  HGB 9.6* 9.2*  HCT 30.5* 28.9*  MCV 93.3 92.6  PLT 251 226   Basic Metabolic Panel  Recent Labs  08/23/16 0410 08/24/16 0458  NA 138 137  K 3.9 3.9  CL 104 101  CO2 24 24  GLUCOSE 127* 86  BUN 44* 45*  CREATININE 3.82* 3.99*  CALCIUM 8.4* 8.3*  MG 2.0  --      Telemetry    Sinus bradycardia (HR 30s occasionally) - Personally Reviewed   Cardiac Studies   TTE: 08/18/16 Study Conclusions - Left ventricle: The cavity size was normal. There was mild   concentric hypertrophy. Systolic function was normal. The   estimated ejection fraction was in the range of 50% to 55%. Wall   motion was normal; there were no  regional wall motion   abnormalities. - Aortic valve: Transvalvular velocity was within the normal range.   There was no stenosis. There was no regurgitation. 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   There was moderate regurgitation. - Left atrium: The atrium was severely dilated. - Right ventricle: The cavity size was normal. Wall thickness was   normal. Systolic function was normal. - Atrial septum: No defect or patent foramen ovale was identified   by color flow Doppler. - Tricuspid valve: There was moderate regurgitation. - Pulmonary arteries: Systolic pressure was severely increased. PA   peak pressure: 83 mm Hg (S). - Pericardium, extracardiac: There was a left pleural effusion.   Ascites was noted.  Patient Profile     70 y/o ? with a h/o CAD s/p CABG in 2003, chronic diastolic CHF, HTN, HL, DM, CKD III-IV, glaucoma w/ legal blindness, valvular heart disease, hypothyroidism, and normocytic anemia, who was admitted 12/15 with dyspnea and found to be volume overloaded and subsequently anemic.  Assessment & Plan    1.  Acute on chronic diastolic CHF/Acute respiratory failure:  Pt improving on exam; I/O not accurate due to incontinence; Weight 130. Would continue present dose of lasix.   2.  Normocytic Anemia with acute worsening: Per IM  3.  Hypertension:  BP elevated; weaning clonidine to off; cannot add beta blocker due to bradycardia; no ACEI due to renal insuff; add cardura 2 mg daily and increase as needed.  4.  Acute on chronic stage IV kidney dzs: not a dialysis candidate per nephrology; follow renal function closely. 5.  DM II:  Per IM.  6. Sinus bradycardia: HR in 40s on tele. Weaning clonidine to off.  Signed, Olga MillersBrian Keaghan Staton, MD  08/24/2016, 10:50 AM

## 2016-08-24 NOTE — Progress Notes (Signed)
Received report from ED RN Wynema BirchAngela S.

## 2016-08-24 NOTE — Clinical Social Work Note (Signed)
CSW facilitated patient discharge including contacting patient family and facility to confirm patient discharge plans. Clinical information faxed to facility and family agreeable with plan. CSW arranged ambulance transport via [facility] to [facility] around 1:15 pm. RN to call report prior to discharge [phone] Room [phone]).  CSW will sign off for now as social work intervention is no longer needed. Please consult us again if new needs arise.  Charlynn CourtSarah Genae Strine, CSW [phone]

## 2016-08-24 NOTE — ED Provider Notes (Signed)
[facility]-EMERGENCY DEPT Provider Note   CSN: [id number] Arrival date & time: 08/24/16  1658     History   Chief Complaint Chief Complaint  Patient presents with  . Weakness    HPI Taylor Padilla is a 70 y.o. female.  HPI Patient was just discharged from the hospital. She is admitted for CHF exacerbation and acute on chronic renal failure. Patient was noted to have a fever of 101 when arriving back to the nursing home. Endorse continued shortness of breath and generalized weakness. No nausea or vomiting. Denies chest pain or abdominal pain. Past Medical History:  Diagnosis Date  . Blind in both eyes    a. legally blind since 2005 per pt.  Marland Kitchen. CAD (coronary artery disease)    a. 01/2002 s/p CABG x 4.  . Chronic diastolic CHF (congestive heart failure) (HCC)    a. 08/2016 Echo: EF 50-55%, no rwma, mild MS, mod MR, sev dil LA, nl RV fxn, mod TR, PASP 83mmHg.  . CKD (chronic kidney disease), stage IV (HCC)   . GERD (gastroesophageal reflux disease)   . Glaucoma, both eyes    a. legally blind.  . High cholesterol   . History of blood transfusion 2003   "related to OHS"  . Hypertensive heart disease   . Hypothyroidism   . Macular edema    hx  . Normocytic anemia    a. 08/2016 s/p 2u PRBCs for H/H 6/20.  Marland Kitchen. PAH (pulmonary artery hypertension)    a. 08/2016 Echo: PASP 83mmHg.  Marland Kitchen. Pneumonia 1990's X 1  . Stroke [facility])    "I've been told I've had one; I don't remember having any reaction from it at all"; denies residual on 10/08/2014  . Type II diabetes mellitus (HCC)   . Valvular heart disease    a. 08/2016 Echo: Mild MS, Mod MR/TR.    Patient Active Problem List   Diagnosis Date Noted  . Viral syndrome   . Pressure injury of skin 08/25/2016  . Dyspnea 08/25/2016  . Hyponatremia 08/24/2016  . Shortness of breath 08/24/2016  . Acute renal failure superimposed on stage 4 chronic kidney disease (HCC) 08/21/2016  . Acute on chronic diastolic congestive heart failure (HCC)   .  Hypertensive heart disease   . Valvular heart disease   . PAH (pulmonary artery hypertension)   . Normocytic anemia   . Acute encephalopathy 01/18/2016  . Chronic diastolic heart failure (HCC) 12/31/2014  . Staphylococcus aureus bacteremia 11/23/2014  . Fever in adult   . Acute on chronic diastolic heart failure (HCC)   . Coronary artery disease involving native coronary artery of native heart without angina pectoris   . Essential hypertension   . CKD (chronic kidney disease) stage 4, GFR 15-29 ml/min (HCC) 10/07/2014  . Hypothyroidism, acquired 10/07/2014  . Dyslipidemia 10/07/2014  . GERD (gastroesophageal reflux disease) 10/07/2014  . Sinus bradycardia 10/07/2014  . Type 2 diabetes mellitus with diabetic nephropathy, with long-term current use of insulin [facility](HCC)     Past Surgical History:  Procedure Laterality Date  . CATARACT EXTRACTION W/ INTRAOCULAR LENS  IMPLANT, BILATERAL Bilateral   . CORONARY ARTERY BYPASS GRAFT  01/2002   [facility]    OB History    No data available       Home Medications    Prior to Admission medications   Medication Sig Start Date End Date Taking? Authorizing Provider  acetaminophen (TYLENOL) 500 MG tablet Take 500 mg by mouth every 4 (four) hours  as needed for fever (pain).    Yes Historical Provider, MD  amLODipine (NORVASC) 10 MG tablet Take 10 mg by mouth daily. 09/27/14  Yes Historical Provider, MD  atorvastatin (LIPITOR) 80 MG tablet Take 80 mg by mouth at bedtime. 09/21/14  Yes Historical Provider, MD  Calcium & Magnesium Carbonates (MYLANTA PO) Take 30 mLs by mouth daily as needed (indigestion).   Yes Historical Provider, MD  Cholecalciferol (VITAMIN D3) 50000 units CAPS Take 50,000 Units by mouth every 30 (thirty) days. Take 1 capsule (50,000 units) by mouth on the 10th of each month   Yes Historical Provider, MD  cloNIDine (CATAPRES) 0.1 MG tablet Take 1 tablet (0.1 mg total) by mouth 2 (two) times daily. 8am, 4pm Would stop  this on 08/28/2016. 08/24/16  Yes Maryann Mikhail, DO  clopidogrel (PLAVIX) 75 MG tablet Take 75 mg by mouth daily. 09/24/14  Yes Historical Provider, MD  donepezil (ARICEPT) 5 MG tablet Take 5 mg by mouth at bedtime.   Yes Historical Provider, MD  dorzolamide-timolol (COSOPT) 22.3-6.8 MG/ML ophthalmic solution Place 1 drop into both eyes every 12 (twelve) hours. 10/06/14  Yes Historical Provider, MD  hydrALAZINE (APRESOLINE) 100 MG tablet Take 1 tablet (100 mg total) by mouth every 8 (eight) hours. 11/03/14  Yes Joseph ArtJessica U Vann, DO  insulin aspart (NOVOLOG) 100 UNIT/ML injection Inject 2-12 Units into the skin 4 (four) times daily. Per sliding scale: CBG 151-200 2 units, 201-250 4 units, 251-300 6 units, 301-350 8 units, 351-400 10 units, 401-450 12 units, Call MD for CBG >451 or <60   Yes Historical Provider, MD  isosorbide dinitrate (ISORDIL) 20 MG tablet Take 20 mg by mouth 3 (three) times daily. 9am, 1pm, 5pm   Yes Historical Provider, MD  latanoprost (XALATAN) 0.005 % ophthalmic solution Place 1 drop into the right eye at bedtime.   Yes Historical Provider, MD  levothyroxine (SYNTHROID, LEVOTHROID) 100 MCG tablet Take 1 tablet (100 mcg total) by mouth daily before breakfast. 11/26/14  Yes Renae FickleMackenzie Short, MD  loperamide (IMODIUM A-D) 2 MG tablet Take 2 mg by mouth every 3 (three) hours as needed for diarrhea or loose stools. Maximum 8 tablets in 24 hours   Yes Historical Provider, MD  Magnesium Hydroxide (MILK OF MAGNESIA PO) Take 30 mLs by mouth at bedtime as needed (constipation).   Yes Historical Provider, MD  Melatonin 3 MG TABS Take 3 mg by mouth at bedtime.   Yes Historical Provider, MD  mirtazapine (REMERON) 15 MG tablet Take 15 mg by mouth at bedtime. For appetite   Yes Historical Provider, MD  Nutritional Supplements (NUTRITIONAL SUPPLEMENT PO) Take 90 mLs by mouth 3 (three) times daily. Med Pass   Yes Historical Provider, MD  ondansetron (ZOFRAN) 4 MG tablet Take 4 mg by mouth every 6 (six)  hours as needed for nausea or vomiting.   Yes Historical Provider, MD  OXYGEN Inhale 2 L into the lungs See admin instructions. "Apply 2 liters per minute via nasal canula for shortness of breath"   Yes Historical Provider, MD  pantoprazole (PROTONIX) 20 MG tablet Take 20 mg by mouth daily at 6 (six) AM.    Yes Historical Provider, MD  polyvinyl alcohol (LIQUIFILM TEARS) 1.4 % ophthalmic solution Place 1 drop into the left eye 4 (four) times daily.   Yes Historical Provider, MD  prednisoLONE acetate (PRED FORTE) 1 % ophthalmic suspension Place 1 drop into the left eye 4 (four) times daily. 09/27/14  Yes Historical Provider, MD  sertraline (  ZOLOFT) 50 MG tablet Take 50 mg by mouth daily.   Yes Historical Provider, MD  doxazosin (CARDURA) 2 MG tablet Take 1 tablet (2 mg total) by mouth daily. Patient not taking: Reported on 08/24/2016 08/24/16   Nita Sells Mikhail, DO  furosemide (LASIX) 40 MG tablet Take 1 tablet (40 mg total) by mouth 2 (two) times daily. Patient not taking: Reported on 08/24/2016 08/24/16   Edsel Petrin, DO    Family History Family History  Problem Relation Age of Onset  . CAD Neg Hx     Social History Social History  Substance Use Topics  . Smoking status: Never Smoker  . Smokeless tobacco: Never Used  . Alcohol use No     Allergies   Epinephrine   Review of Systems Review of Systems  Constitutional: Positive for chills, fatigue and fever.  HENT: Negative for congestion and sore throat.   Respiratory: Positive for cough and shortness of breath. Negative for wheezing.   Cardiovascular: Negative for chest pain and leg swelling.  Gastrointestinal: Negative for abdominal pain, constipation, diarrhea, nausea and vomiting.  Genitourinary: Negative for dysuria, flank pain and frequency.  Musculoskeletal: Negative for back pain and neck pain.  Skin: Negative for rash.  Neurological: Negative for dizziness, numbness and headaches. Weakness:  generalized.  All other  systems reviewed and are negative.    Physical Exam Updated Vital Signs BP (!) 151/52 (BP Location: Left Arm)   Pulse (!) 50   Temp 98.2 F (36.8 C) (Oral)   Resp 17   Ht 5\' 1"  (1.549 m)   Wt 128 lb 1.6 oz (58.1 kg)   SpO2 98%   BMI 24.20 kg/m   Physical Exam  Constitutional: She is oriented to person, place, and time. She appears well-developed and well-nourished. No distress.  HENT:  Head: Normocephalic and atraumatic.  Mouth/Throat: Oropharynx is clear and moist.  Eyes:  Blind in both eyes  Neck: Normal range of motion. Neck supple. No JVD present.  No meningismus  Cardiovascular: Regular rhythm.   Bradycardia  Pulmonary/Chest: Effort normal. No respiratory distress. She has no wheezes. She has rales.  Rales in bilateral bases  Abdominal: Soft. Bowel sounds are normal. She exhibits no distension and no mass. There is no tenderness. There is no rebound and no guarding. No hernia.  Musculoskeletal: Normal range of motion. She exhibits no edema or tenderness.  No lower extremity swelling or asymmetry.  Neurological: She is alert and oriented to person, place, and time.  Moving all extremities without deficit. Sensation intact.  Skin: Skin is warm and dry. Capillary refill takes less than 2 seconds. No rash noted. No erythema.  Psychiatric: She has a normal mood and affect. Her behavior is normal.  Nursing note and vitals reviewed.    ED Treatments / Results  Labs (all labs ordered are listed, but only abnormal results are displayed) Labs Reviewed  RESPIRATORY PANEL BY PCR - Abnormal; Notable for the following:       Result Value   Respiratory Syncytial Virus DETECTED (*)    All other components within normal limits  COMPREHENSIVE METABOLIC PANEL - Abnormal; Notable for the following:    Sodium 132 (*)    Chloride 97 (*)    Glucose, Bld 110 (*)    BUN 48 (*)    Creatinine, Ser 4.12 (*)    Calcium 8.2 (*)    Total Protein 6.0 (*)    Albumin 2.9 (*)    ALT 10  (*)  GFR calc non Af Amer 10 (*)    GFR calc Af Amer 12 (*)    All other components within normal limits  CBC WITH DIFFERENTIAL/PLATELET - Abnormal; Notable for the following:    RBC 3.01 (*)    Hemoglobin 8.7 (*)    HCT 27.1 (*)    Lymphs Abs 0.5 (*)    All other components within normal limits  BRAIN NATRIURETIC PEPTIDE - Abnormal; Notable for the following:    B Natriuretic Peptide 1,397.0 (*)    All other components within normal limits  BASIC METABOLIC PANEL - Abnormal; Notable for the following:    Sodium 133 (*)    Chloride 97 (*)    Glucose, Bld 109 (*)    BUN 50 (*)    Creatinine, Ser 4.20 (*)    Calcium 8.5 (*)    GFR calc non Af Amer 10 (*)    GFR calc Af Amer 11 (*)    All other components within normal limits  CBC - Abnormal; Notable for the following:    RBC 3.08 (*)    Hemoglobin 9.0 (*)    HCT 27.6 (*)    All other components within normal limits  TROPONIN I - Abnormal; Notable for the following:    Troponin I 0.03 (*)    All other components within normal limits  GLUCOSE, CAPILLARY - Abnormal; Notable for the following:    Glucose-Capillary 109 (*)    All other components within normal limits  GLUCOSE, CAPILLARY - Abnormal; Notable for the following:    Glucose-Capillary 120 (*)    All other components within normal limits  CBC - Abnormal; Notable for the following:    RBC 3.10 (*)    Hemoglobin 8.9 (*)    HCT 27.6 (*)    All other components within normal limits  GLUCOSE, CAPILLARY - Abnormal; Notable for the following:    Glucose-Capillary 166 (*)    All other components within normal limits  BASIC METABOLIC PANEL - Abnormal; Notable for the following:    Potassium 3.3 (*)    Chloride 99 (*)    Glucose, Bld 142 (*)    BUN 53 (*)    Creatinine, Ser 4.28 (*)    Calcium 8.2 (*)    GFR calc non Af Amer 10 (*)    GFR calc Af Amer 11 (*)    All other components within normal limits  GLUCOSE, CAPILLARY - Abnormal; Notable for the following:     Glucose-Capillary 146 (*)    All other components within normal limits  GLUCOSE, CAPILLARY - Abnormal; Notable for the following:    Glucose-Capillary 190 (*)    All other components within normal limits  I-STAT CG4 LACTIC ACID, ED - Abnormal; Notable for the following:    Lactic Acid, Venous 0.42 (*)    All other components within normal limits  CULTURE, EXPECTORATED SPUTUM-ASSESSMENT  CULTURE, RESPIRATORY (NON-EXPECTORATED)  CULTURE, BLOOD (ROUTINE X 2)  CULTURE, BLOOD (ROUTINE X 2)  GRAM STAIN  INFLUENZA PANEL BY PCR (TYPE A & B, H1N1)  PROCALCITONIN  GLUCOSE, CAPILLARY  I-STAT CG4 LACTIC ACID, ED  I-STAT TROPOININ, ED    EKG  EKG Interpretation  Date/Time:  Thursday August 24 2016 17:05:30 EST Ventricular Rate:  49 PR Interval:    QRS Duration: 111 QT Interval:  485 QTC Calculation: 438 R Axis:   81 Text Interpretation:  Sinus bradycardia Borderline right axis deviation Abnormal R-wave progression, late transition Probable left ventricular hypertrophy Borderline  T abnormalities, lateral leads Confirmed by Wilkie Aye  MD, COURTNEY ([id number]) on 08/27/2016 4:48:52 AM       Radiology No results found.  Procedures Procedures (including critical care time)  Medications Ordered in ED Medications  piperacillin-tazobactam (ZOSYN) IVPB 3.375 g (0 g Intravenous Stopped 08/24/16 1855)  vancomycin (VANCOCIN) IVPB 1000 mg/200 mL premix (0 mg Intravenous Stopped 08/24/16 2021)  potassium chloride SA (K-DUR,KLOR-CON) CR tablet 40 mEq (40 mEq Oral Given 08/26/16 1836)     Initial Impression / Assessment and Plan / ED Course  I have reviewed the triage vital signs and the nursing notes.  Pertinent labs & imaging results that were available during my care of the patient were reviewed by me and considered in my medical decision making (see chart for details).  Clinical Course    Discussed with hospitalist and we'll see patient in the emergency department. Dr. Maryfrances Bunnell evaluated  patient this does not believe that infiltrates on x-ray represent pneumonia. Will hold antibiotics and admit for observation.   Final Clinical Impressions(s) / ED Diagnoses   Final diagnoses:  Bilateral pulmonary infiltrates on CXR  Fever in adult    New Prescriptions Discharge Medication List as of 08/26/2016  4:04 PM       Loren Racer, MD 08/28/16 [phone]

## 2016-08-24 NOTE — ED Notes (Signed)
MD sts okay for patient to eat/drink. Provided with Mal[place]key sandwich and sprite, tolerated well.

## 2016-08-24 NOTE — ED Triage Notes (Signed)
Pt was discharged today 08/24/16 from the floor and brought back EMS within 2 hours from [facility] due to a fever per facility. Pt stated this weakness is not anything new just more severe than before. Per EMS says the pt has a known history of unexplained fevers in her history. Pt alert oriented x 4. Visually impaired, Per EMS VITALS cbg 114, 47 HR, 145/47 Bp. Sinus brady.

## 2016-08-24 NOTE — ED Notes (Signed)
Admitting at bedside 

## 2016-08-25 DIAGNOSIS — R001 Bradycardia, unspecified: Secondary | ICD-10-CM | POA: Diagnosis not present

## 2016-08-25 DIAGNOSIS — I130 Hypertensive heart and chronic kidney disease with heart failure and stage 1 through stage 4 chronic kidney disease, or unspecified chronic kidney disease: Secondary | ICD-10-CM | POA: Diagnosis present

## 2016-08-25 DIAGNOSIS — Z794 Long term (current) use of insulin: Secondary | ICD-10-CM | POA: Diagnosis not present

## 2016-08-25 DIAGNOSIS — H548 Legal blindness, as defined in USA: Secondary | ICD-10-CM | POA: Diagnosis present

## 2016-08-25 DIAGNOSIS — Z951 Presence of aortocoronary bypass graft: Secondary | ICD-10-CM | POA: Diagnosis not present

## 2016-08-25 DIAGNOSIS — B974 Respiratory syncytial virus as the cause of diseases classified elsewhere: Secondary | ICD-10-CM | POA: Diagnosis present

## 2016-08-25 DIAGNOSIS — E1122 Type 2 diabetes mellitus with diabetic chronic kidney disease: Secondary | ICD-10-CM | POA: Diagnosis present

## 2016-08-25 DIAGNOSIS — Z79899 Other long term (current) drug therapy: Secondary | ICD-10-CM | POA: Diagnosis not present

## 2016-08-25 DIAGNOSIS — Z888 Allergy status to other drugs, medicaments and biological substances status: Secondary | ICD-10-CM | POA: Diagnosis not present

## 2016-08-25 DIAGNOSIS — J129 Viral pneumonia, unspecified: Secondary | ICD-10-CM | POA: Diagnosis present

## 2016-08-25 DIAGNOSIS — N179 Acute kidney failure, unspecified: Secondary | ICD-10-CM | POA: Diagnosis present

## 2016-08-25 DIAGNOSIS — E871 Hypo-osmolality and hyponatremia: Secondary | ICD-10-CM | POA: Diagnosis present

## 2016-08-25 DIAGNOSIS — E1121 Type 2 diabetes mellitus with diabetic nephropathy: Secondary | ICD-10-CM | POA: Diagnosis present

## 2016-08-25 DIAGNOSIS — I10 Essential (primary) hypertension: Secondary | ICD-10-CM | POA: Diagnosis not present

## 2016-08-25 DIAGNOSIS — R509 Fever, unspecified: Secondary | ICD-10-CM | POA: Diagnosis not present

## 2016-08-25 DIAGNOSIS — E039 Hypothyroidism, unspecified: Secondary | ICD-10-CM | POA: Diagnosis present

## 2016-08-25 DIAGNOSIS — D649 Anemia, unspecified: Secondary | ICD-10-CM | POA: Diagnosis present

## 2016-08-25 DIAGNOSIS — Z66 Do not resuscitate: Secondary | ICD-10-CM | POA: Diagnosis present

## 2016-08-25 DIAGNOSIS — Z7902 Long term (current) use of antithrombotics/antiplatelets: Secondary | ICD-10-CM | POA: Diagnosis not present

## 2016-08-25 DIAGNOSIS — Z9981 Dependence on supplemental oxygen: Secondary | ICD-10-CM | POA: Diagnosis not present

## 2016-08-25 DIAGNOSIS — I2510 Atherosclerotic heart disease of native coronary artery without angina pectoris: Secondary | ICD-10-CM | POA: Diagnosis present

## 2016-08-25 DIAGNOSIS — I5032 Chronic diastolic (congestive) heart failure: Secondary | ICD-10-CM | POA: Diagnosis present

## 2016-08-25 DIAGNOSIS — N184 Chronic kidney disease, stage 4 (severe): Secondary | ICD-10-CM | POA: Diagnosis present

## 2016-08-25 DIAGNOSIS — I2721 Secondary pulmonary arterial hypertension: Secondary | ICD-10-CM | POA: Diagnosis present

## 2016-08-25 DIAGNOSIS — Z961 Presence of intraocular lens: Secondary | ICD-10-CM | POA: Diagnosis present

## 2016-08-25 DIAGNOSIS — K219 Gastro-esophageal reflux disease without esophagitis: Secondary | ICD-10-CM | POA: Diagnosis present

## 2016-08-25 LAB — GLUCOSE, CAPILLARY
GLUCOSE-CAPILLARY: 97 mg/dL (ref 65–99)
Glucose-Capillary: 120 mg/dL — ABNORMAL HIGH (ref 65–99)
Glucose-Capillary: 166 mg/dL — ABNORMAL HIGH (ref 65–99)

## 2016-08-25 LAB — RESPIRATORY PANEL BY PCR
Adenovirus: NOT DETECTED
BORDETELLA PERTUSSIS-RVPCR: NOT DETECTED
CORONAVIRUS 229E-RVPPCR: NOT DETECTED
CORONAVIRUS HKU1-RVPPCR: NOT DETECTED
CORONAVIRUS NL63-RVPPCR: NOT DETECTED
CORONAVIRUS OC43-RVPPCR: NOT DETECTED
Chlamydophila pneumoniae: NOT DETECTED
Influenza A: NOT DETECTED
Influenza B: NOT DETECTED
METAPNEUMOVIRUS-RVPPCR: NOT DETECTED
Mycoplasma pneumoniae: NOT DETECTED
PARAINFLUENZA VIRUS 1-RVPPCR: NOT DETECTED
PARAINFLUENZA VIRUS 2-RVPPCR: NOT DETECTED
Parainfluenza Virus 3: NOT DETECTED
Parainfluenza Virus 4: NOT DETECTED
RHINOVIRUS / ENTEROVIRUS - RVPPCR: NOT DETECTED
Respiratory Syncytial Virus: DETECTED — AB

## 2016-08-25 LAB — BASIC METABOLIC PANEL
Anion gap: 12 (ref 5–15)
BUN: 50 mg/dL — AB (ref 6–20)
CHLORIDE: 97 mmol/L — AB (ref 101–111)
CO2: 24 mmol/L (ref 22–32)
CREATININE: 4.2 mg/dL — AB (ref 0.44–1.00)
Calcium: 8.5 mg/dL — ABNORMAL LOW (ref 8.9–10.3)
GFR calc Af Amer: 11 mL/min — ABNORMAL LOW (ref >60)
GFR calc non Af Amer: 10 mL/min — ABNORMAL LOW (ref >60)
GLUCOSE: 109 mg/dL — AB (ref 65–99)
POTASSIUM: 3.6 mmol/L (ref 3.5–5.1)
Sodium: 133 mmol/L — ABNORMAL LOW (ref 135–145)

## 2016-08-25 LAB — CBC
HEMATOCRIT: 27.6 % — AB (ref 36.0–46.0)
Hemoglobin: 9 g/dL — ABNORMAL LOW (ref 12.0–15.0)
MCH: 29.2 pg (ref 26.0–34.0)
MCHC: 32.6 g/dL (ref 30.0–36.0)
MCV: 89.6 fL (ref 78.0–100.0)
PLATELETS: 203 10*3/uL (ref 150–400)
RBC: 3.08 MIL/uL — ABNORMAL LOW (ref 3.87–5.11)
RDW: 14.5 % (ref 11.5–15.5)
WBC: 5.7 10*3/uL (ref 4.0–10.5)

## 2016-08-25 LAB — TROPONIN I: Troponin I: 0.03 ng/mL (ref <0.03)

## 2016-08-25 MED ORDER — DEXTROSE 5 % IV SOLN
1.0000 g | INTRAVENOUS | Status: DC
  Administered 2016-08-25 – 2016-08-26 (×2): 1 g via INTRAVENOUS
  Filled 2016-08-25 (×2): qty 1

## 2016-08-25 MED ORDER — VANCOMYCIN HCL IN DEXTROSE 1-5 GM/200ML-% IV SOLN
1000.0000 mg | INTRAVENOUS | Status: DC
  Filled 2016-08-25: qty 200

## 2016-08-25 NOTE — Progress Notes (Signed)
Pharmacy Antibiotic Note  Taylor Padilla is a 70 y.o. female admitted on 08/24/2016 presenting with fever and SOB from nursing home.  The patient was admitted one week ago for acute respiratory failure, hypoxia. She was treated with 5 days Levaquin for possible pneumonia, but ultimately was thought to be from acute on chronic diastolic CHF.  Recently discharged from hospital 08/24/16 then ~ 2hours later [facility] was [facility] due to fever and SOB.   Pharmacy has been consulted for Vancomycin dosing for pneumonia.  1st IV vancomycin dose of 1gm IV  was already given @ 19:16  Yesterday, 12/21  Afebrile , WBC is wnl.  Lactate 0.42 H/o CKD for, baseline creatinine 4, SCr is 4.2 today. Also now on Cefepime 1gm IV q24h- dose is appropriate for her renal function.   Plan: Vancomycin 1000 mg IV q48h - next due on 12/23 @ 18:30 Monitor clinical progress, renal function daily . Check vancomycin trough at steady state if vancomycin continues >5 days.   Height: 5\' 1"  (154.9 cm) Weight: 133 lb 9.6 oz (60.6 kg) IBW/kg (Calculated) : 47.8  Temp (24hrs), Avg:99.5 F (37.5 C), Min:98.1 F (36.7 C), Max:101.4 F (38.6 C)   Recent Labs Lab 08/18/16 1615 08/18/16 1659  08/20/16 0417  08/22/16 0332 08/23/16 0410 08/24/16 0458 08/24/16 1740 08/24/16 1752 08/24/16 2028 08/25/16 0458  WBC  --   --   < > 7.5  --   --  4.9 7.6 7.1  --   --  5.7  CREATININE  --   --   < > 3.48*  < > 3.58* 3.82* 3.99* 4.12*  --   --  4.20*  LATICACIDVEN 1.3 1.3  --   --   --   --   --   --   --  0.54 0.42*  --   < > = values in this interval not displayed.  Estimated Creatinine Clearance: 10.3 mL/min (by C-G formula based on SCr of 4.2 mg/dL (H)).    Allergies  Allergen Reactions  . Epinephrine Other (See Comments)    Patient says she is not allergic to epi, but that it makes her jittery.  WILL RECEIVE IF NEEDED IN AN EMERGENCY.    Antimicrobials this admission: 12/21 Vancomycin  >>   12/21 Zosyn x1  12/22  Cefepime >> (x 8 doses, thru 09/02/16 0949)  Dose adjustments this admission: n/a  Microbiology results: 12/22 Resp panel: RSV detected 12/21 sputum cx: few GPC, GPRs, GNRs, rare Gram neg cocci in pairs 12/22  BCx: sent   Thank you for allowing pharmacy to be a part of this patient's care. Noah Delaineuth Jasraj Lappe, RPh Clinical Pharmacist Pager: [phone] 08/25/2016 4:12 PM

## 2016-08-25 NOTE — Progress Notes (Signed)
PROGRESS NOTE    Taylor Padilla  ZOX:[id number] DOB: 1945-11-12 DOA: 08/24/2016 PCP: No PCP Per Patient   Outpatient Specialists:     Brief Narrative:  Taylor Padilla is a 70 y.o. female with a past medical history significant for blindness, diastolic CHF, CAD s/p CABG in 8119, CKD for, baseline creatinine 4, anemia, IDDM, PAH, hypothyroidism and HTN who presents with fever and SOB from nursing home.  The patient was admitted one week ago for acute respiratory failure, hypoxia. She was treated with 5 days Levaquin for possible pneumonia, but ultimately was thought to be from acute on chronic diastolic CHF, she was diuresed with IV Lasix and seen by cardiology, did undergo 2 units PRBCs transfusion for anemia, then transitioned to oral Lasix 3 days ago, and appeared stable for discharge this morning, procalcitonin trending down from 0.47 --> 0.36 this morning.  She is wheelchair bound for years, but not on home O2 usually, and was discharged on new O2.  When she arrived back to [facility] this afternoon, she complained to staff there of shortness of breath, they obtained a temperature that was 101F, increased her O2 to 4L and sent her back to the ER.   To me, she states she came back because of shortness of breath.  EMS were told by staff that she was sent for fever.  She has generalized weakness, increased non-productive cough.  No coryza, sore throat.  No myalgias, rigors.  No dysuria, hematuria.    Assessment & Plan:   Principal Problem:   Fever in adult Active Problems:   Type 2 diabetes mellitus with diabetic nephropathy, with long-term current use of insulin (HCC)   CKD (chronic kidney disease) stage 4, GFR 15-29 ml/min (HCC)   Hypothyroidism, acquired   Sinus bradycardia   Coronary artery disease involving native coronary artery of native heart without angina pectoris   Essential hypertension   Chronic diastolic heart failure (HCC)   Normocytic anemia   Hyponatremia  Shortness of breath   Pressure injury of skin   Fever:  Recently in hospital, while in the hospital, had been treated with Levaquin for presumed CAP because of equivocal CXR and hypoxia.  She got 5d Levaquin Other causes of fever are atelectasis, new URI, UTI -add abx to cover for PNA seen on x ray -Culture -Incentive spirometry -Obtain UA and culture if pyuria -Obtain sputum culture and gram stain -Follow flu swab and NP swab  Shortness of breath:  saturating in 95-100% on room air, except briefly while eating.   She has advanced heart failure and pulmonary hypertension and was just diuresed under the direction of Cardiology, doubt acute CHF.   Anemia is only slightly lower than post-transfusion, doubt this is symptomatic anemia. -spot O2 checks  HTN and CAD secondary prevention:  -Continue amlodipine, Imdur, hydralazine, clonidine -Continue new doxazosin -Continue statin and Plavix  Chronic disatolic CHF:  EF 50-55%.   -Continue furosemide BID  Normocytic anemia:  Slight trend down.  Anemia not addressed by Neph during last hospitalization, but given 2U PRBCs for Hgb 6.4 (bumped to 9.6, down to 9.2 at discharge, 8.7 today).   -Trend CBC -Check iron stores, B12 stores  Hyponatremia:  In setting of CHF.  Mild. -Trend BMP  IDDM:  -Hold home insulin -Low dose SSI with meals  CKD IV: -Daily BMP  Hypothyroidism: -Continue levothyroxine    DVT prophylaxis:  SQ Heparin  Code Status: DNR   Family Communication: No family at bedside  Disposition Plan:  Consultants:     Subjective: C/o being cold-- asking for more covers  Objective: Vitals:   08/25/16 0500 08/25/16 0527 08/25/16 0529 08/25/16 0529  BP:  (!) 156/50    Pulse:  (!) 47 (!) 48 (!) 114  Resp:  18    Temp:  98.1 F (36.7 C)    TempSrc:  Oral    SpO2:  98% 100% 99%  Weight: 60.6 kg (133 lb 9.6 oz)     Height:        Intake/Output Summary (Last 24 hours) at 08/25/16  0911 Last data filed at 08/25/16 [id number]  Gross per 24 hour  Intake              450 ml  Output                0 ml  Net              450 ml   Filed Weights   08/24/16 1713 08/24/16 2256 08/25/16 0500  Weight: 62.1 kg (137 lb) 59.8 kg (131 lb 14.4 oz) 60.6 kg (133 lb 9.6 oz)    Examination:  General exam: covers over head Respiratory system: diminished, no wheezing (poor effort) Cardiovascular system: fast >100 Gastrointestinal system: Abdomen is nondistended, soft and nontender. No organomegaly or masses felt. Normal bowel sounds heard. Central nervous system: Alert  No focal neurological deficits.     Data Reviewed: I have personally reviewed following labs and imaging studies  CBC:  Recent Labs Lab 08/18/16 1025  08/20/16 0417 08/23/16 0410 08/24/16 0458 08/24/16 1740 08/25/16 0458  WBC 11.0*  < > 7.5 4.9 7.6 7.1 5.7  NEUTROABS 9.4*  --  6.1  --   --  6.0  --   HGB 8.2*  < > 9.6* 9.6* 9.2* 8.7* 9.0*  HCT 27.0*  < > 29.9* 30.5* 28.9* 27.1* 27.6*  MCV 92.8  < > 90.9 93.3 92.6 90.0 89.6  PLT 401*  < > 253 251 226 228 203  < > = values in this interval not displayed. Basic Metabolic Panel:  Recent Labs Lab 08/22/16 0332 08/23/16 0410 08/24/16 0458 08/24/16 1740 08/25/16 0458  NA 136 138 137 132* 133*  K 3.0* 3.9 3.9 3.8 3.6  CL 102 104 101 97* 97*  CO2 24 24 24 24 24   GLUCOSE 126* 127* 86 110* 109*  BUN 44* 44* 45* 48* 50*  CREATININE 3.58* 3.82* 3.99* 4.12* 4.20*  CALCIUM 8.2* 8.4* 8.3* 8.2* 8.5*  MG  --  2.0  --   --   --    GFR: Estimated Creatinine Clearance: 10.3 mL/min (by C-G formula based on SCr of 4.2 mg/dL (H)). Liver Function Tests:  Recent Labs Lab 08/18/16 1025 08/19/16 0400 08/20/16 0417 08/24/16 1740  AST 18 15 16 21   ALT 9* 10* 10* 10*  ALKPHOS 81 62 69 62  BILITOT 0.8 0.7 1.0 0.7  PROT 7.1 5.9* 6.3* 6.0*  ALBUMIN 3.4* 2.6* 2.9* 2.9*   No results for input(s): LIPASE, AMYLASE in the last 168 hours. No results for input(s):  AMMONIA in the last 168 hours. Coagulation Profile: No results for input(s): INR, PROTIME in the last 168 hours. Cardiac Enzymes:  Recent Labs Lab 08/18/16 1615 08/18/16 2145 08/19/16 0400 08/25/16 0458  TROPONINI 0.04* 0.05* 0.06* 0.03*   BNP (last 3 results) No results for input(s): PROBNP in the last 8760 hours. HbA1C: No results for input(s): HGBA1C in the last 72 hours. CBG:  Recent Labs  Lab 08/23/16 2111 08/24/16 0703 08/24/16 1133 08/24/16 2357 08/25/16 0752  GLUCAP 103* 100* 123* 109* 120*   Lipid Profile: No results for input(s): CHOL, HDL, LDLCALC, TRIG, CHOLHDL, LDLDIRECT in the last 72 hours. Thyroid Function Tests: No results for input(s): TSH, T4TOTAL, FREET4, T3FREE, THYROIDAB in the last 72 hours. Anemia Panel: No results for input(s): VITAMINB12, FOLATE, FERRITIN, TIBC, IRON, RETICCTPCT in the last 72 hours. Urine analysis:    Component Value Date/Time   COLORURINE YELLOW 01/15/2016 2112   APPEARANCEUR TURBID (A) 01/15/2016 2112   LABSPEC 1.015 01/15/2016 2112   PHURINE 5.0 01/15/2016 2112   GLUCOSEU 250 (A) 01/15/2016 2112   HGBUR LARGE (A) 01/15/2016 2112   BILIRUBINUR NEGATIVE 01/15/2016 2112   KETONESUR NEGATIVE 01/15/2016 2112   PROTEINUR 100 (A) 01/15/2016 2112   UROBILINOGEN 0.2 07/10/2015 0401   NITRITE NEGATIVE 01/15/2016 2112   LEUKOCYTESUR LARGE (A) 01/15/2016 2112    ) Recent Results (from the past 240 hour(s))  MRSA PCR Screening     Status: Abnormal   Collection Time: 08/18/16  2:36 PM  Result Value Ref Range Status   MRSA by PCR POSITIVE (A) NEGATIVE Final    Comment:        The GeneXpert MRSA Assay (FDA approved for NASAL specimens only), is one component of a comprehensive MRSA colonization surveillance program. It is not intended to diagnose MRSA infection nor to guide or monitor treatment for MRSA infections. RESULT CALLED TO, READ BACK BY AND VERIFIED WITH: Yevonne AlineL. King RN 16:20 08/18/16 ([email or web address])   Culture, blood  (Routine X 2) w Reflex to ID Panel     Status: None   Collection Time: 08/18/16  4:15 PM  Result Value Ref Range Status   Specimen Description BLOOD LEFT ANTECUBITAL  Final   Special Requests IN PEDIATRIC BOTTLE 2CC  Final   Culture NO GROWTH 5 DAYS  Final   Report Status 08/23/2016 FINAL  Final  Culture, blood (Routine X 2) w Reflex to ID Panel     Status: None   Collection Time: 08/18/16  4:21 PM  Result Value Ref Range Status   Specimen Description BLOOD RIGHT HAND  Final   Special Requests BOTTLES DRAWN AEROBIC ONLY 5CC  Final   Culture NO GROWTH 5 DAYS  Final   Report Status 08/23/2016 FINAL  Final  Culture, sputum-assessment     Status: None   Collection Time: 08/24/16  9:07 PM  Result Value Ref Range Status   Specimen Description EXPECTORATED SPUTUM  Final   Special Requests NONE  Final   Sputum evaluation THIS SPECIMEN IS ACCEPTABLE FOR SPUTUM CULTURE  Final   Report Status 08/24/2016 FINAL  Final  Culture, respiratory (NON-Expectorated)     Status: None (Preliminary result)   Collection Time: 08/24/16  9:07 PM  Result Value Ref Range Status   Specimen Description EXPECTORATED SPUTUM  Final   Special Requests NONE Reflexed from [id number]  Final   Gram Stain   Final    ABUNDANT WBC PRESENT,BOTH PMN AND MONONUCLEAR FEW GRAM POSITIVE COCCI IN PAIRS FEW GRAM POSITIVE RODS FEW GRAM NEGATIVE RODS RARE GRAM NEGATIVE COCCI IN PAIRS    Culture PENDING  Incomplete   Report Status PENDING  Incomplete      Anti-infectives    Start     Dose/Rate Route Frequency Ordered Stop   08/25/16 1000  ceFEPIme (MAXIPIME) 1 g in dextrose 5 % 50 mL IVPB     1 g 100 mL/hr over 30 Minutes Intravenous  Every 24 hours 08/25/16 0900 09/02/16 0959   08/24/16 1815  piperacillin-tazobactam (ZOSYN) IVPB 3.375 g     3.375 g 100 mL/hr over 30 Minutes Intravenous  Once 08/24/16 1805 08/24/16 1855   08/24/16 1815  vancomycin (VANCOCIN) IVPB 1000 mg/200 mL premix  Status:  Discontinued     1,000 mg 200  mL/hr over 60 Minutes Intravenous  Once 08/24/16 1805 08/24/16 1805   08/24/16 1815  vancomycin (VANCOCIN) IVPB 1000 mg/200 mL premix     1,000 mg 200 mL/hr over 60 Minutes Intravenous  Once 08/24/16 1805 08/24/16 2021       Radiology Studies: Dg Chest Port 1 View  Result Date: 08/24/2016 CLINICAL DATA:  Shortness of breath x2 days EXAM: PORTABLE CHEST 1 VIEW COMPARISON:  08/19/2016 FINDINGS: Mild patchy right lower lobe opacity, suspicious for pneumonia. Mild left basilar opacity, likely atelectasis. No pleural effusion or pneumothorax. Cardiomegaly.  Postsurgical changes related to prior CABG. IMPRESSION: Mild patchy right lower lobe opacity, suspicious for pneumonia. Electronically Signed   By: Charline Bills M.D.   On: 08/24/2016 17:56        Scheduled Meds: . amLODipine  10 mg Oral Daily  . atorvastatin  80 mg Oral QHS  . ceFEPime (MAXIPIME) IV  1 g Intravenous Q24H  . cloNIDine  0.1 mg Oral BID  . clopidogrel  75 mg Oral Daily  . donepezil  5 mg Oral QHS  . dorzolamide  1 drop Both Eyes BID   And  . timolol  1 drop Both Eyes BID  . doxazosin  2 mg Oral Daily  . feeding supplement (ENSURE ENLIVE)  1 Bottle Oral TID  . furosemide  40 mg Oral BID  . heparin  5,000 Units Subcutaneous Q8H  . hydrALAZINE  100 mg Oral Q8H  . insulin aspart  0-5 Units Subcutaneous QHS  . insulin aspart  0-9 Units Subcutaneous TID WC  . isosorbide dinitrate  20 mg Oral TID  . levothyroxine  100 mcg Oral QAC breakfast  . mirtazapine  15 mg Oral QHS  . pantoprazole  20 mg Oral Q0600  . polyvinyl alcohol  1 drop Left Eye TID AC & HS  . prednisoLONE acetate  1 drop Left Eye TID AC & HS  . sertraline  50 mg Oral Daily   Continuous Infusions:   LOS: 0 days    Time spent: 35 min    Tonnia Bardin U Carrine Kroboth, DO [facility] Pager [phone]  If 7PM-7AM, please contact night-coverage [email or web address] Password [facility] 08/25/2016, 9:11 AM

## 2016-08-25 NOTE — Progress Notes (Signed)
CRITICAL VALUE ALERT  Critical value received:  Troponin 0.03  Date of notification:  08/25/16  Time of notification:  0615  Critical value read back:Yes.    Nurse who received alert:  Idelle JoNikki Jaclynn Laumann, RN  MD notified (1st page):  Craige CottaKirby, NP  Time of first page:  [phone]  MD notified (2nd page):  Time of second page:  Responding MD:   Time MD responded:

## 2016-08-25 NOTE — Progress Notes (Signed)
NURSING PROGRESS NOTE  Valentino HueDorcas Meinhart [id number] Admission Data: 08/25/2016 1:42 AM Attending Provider: Alberteen Samhristopher P Danford, MD PCP:No PCP Per Patient Code Status: DNR  Allergies:  Epinephrine Past Medical History:   has a past medical history of Blind in both eyes; CAD (coronary artery disease); Chronic diastolic CHF (congestive heart failure) (HCC); CKD (chronic kidney disease), stage IV (HCC); GERD (gastroesophageal reflux disease); Glaucoma, both eyes; High cholesterol; History of blood transfusion (2003); Hypertensive heart disease; Hypothyroidism; Macular edema; Normocytic anemia; PAH (pulmonary artery hypertension); Pneumonia (1990's X 1); Stroke (HCC); Type II diabetes mellitus (HCC); and Valvular heart disease. Past Surgical History:   has a past surgical history that includes Coronary artery bypass graft (01/2002) and Cataract extraction w/ intraocular lens  implant, bilateral (Bilateral). Social History:   reports that she has never smoked. She has never used smokeless tobacco. She reports that she does not drink alcohol or use drugs.  Valentino HueDorcas Partin is a 70 y.o. female patient admitted from ED:   Last Documented Vital Signs: Blood pressure (!) 155/48, pulse (!) 50, temperature 99.4 F (37.4 C), temperature source Oral, resp. rate 18, height 5\' 1"  (1.549 m), weight 59.8 kg (131 lb 14.4 oz), SpO2 98 %.   IV Fluids:  IV in place, occlusive dsg intact without redness, IV cath forearm right, condition patent and no redness none.   Skin: Appropriate for ethnicity with stage 1, non-blanchable redness on sacrum and abrasion on right forearm.  Patient orientated to room.  Admission inpatient armband information verified with patient to include name and date of birth and placed on patient arm. Side rails up x 2, fall assessment and education completed with patient. Patient able to verbalize understanding of risk associated with falls and verbalized understanding to call for assistance before  getting out of bed. Soft-touch call light within reach. Patient able to voice and demonstrate understanding of unit orientation instructions.    Will continue to evaluate and treat per MD orders.  Sue LushKaelin Romesberg RN, BSN

## 2016-08-26 LAB — CBC
HEMATOCRIT: 27.6 % — AB (ref 36.0–46.0)
HEMOGLOBIN: 8.9 g/dL — AB (ref 12.0–15.0)
MCH: 28.7 pg (ref 26.0–34.0)
MCHC: 32.2 g/dL (ref 30.0–36.0)
MCV: 89 fL (ref 78.0–100.0)
PLATELETS: 189 10*3/uL (ref 150–400)
RBC: 3.1 MIL/uL — AB (ref 3.87–5.11)
RDW: 14.3 % (ref 11.5–15.5)
WBC: 6.2 10*3/uL (ref 4.0–10.5)

## 2016-08-26 LAB — BASIC METABOLIC PANEL
ANION GAP: 13 (ref 5–15)
BUN: 53 mg/dL — ABNORMAL HIGH (ref 6–20)
CALCIUM: 8.2 mg/dL — AB (ref 8.9–10.3)
CHLORIDE: 99 mmol/L — AB (ref 101–111)
CO2: 24 mmol/L (ref 22–32)
Creatinine, Ser: 4.28 mg/dL — ABNORMAL HIGH (ref 0.44–1.00)
GFR calc non Af Amer: 10 mL/min — ABNORMAL LOW (ref >60)
GFR, EST AFRICAN AMERICAN: 11 mL/min — AB (ref >60)
Glucose, Bld: 142 mg/dL — ABNORMAL HIGH (ref 65–99)
POTASSIUM: 3.3 mmol/L — AB (ref 3.5–5.1)
Sodium: 136 mmol/L (ref 135–145)

## 2016-08-26 LAB — PROCALCITONIN: Procalcitonin: 0.25 ng/mL

## 2016-08-26 LAB — GLUCOSE, CAPILLARY
GLUCOSE-CAPILLARY: 190 mg/dL — AB (ref 65–99)
Glucose-Capillary: 146 mg/dL — ABNORMAL HIGH (ref 65–99)

## 2016-08-26 MED ORDER — POTASSIUM CHLORIDE CRYS ER 20 MEQ PO TBCR
40.0000 meq | EXTENDED_RELEASE_TABLET | Freq: Once | ORAL | Status: AC
  Administered 2016-08-26: 40 meq via ORAL
  Filled 2016-08-26: qty 2

## 2016-08-26 NOTE — Clinical Social Work Note (Signed)
CSW facilitated patient discharge including contacting patient family and facility to confirm patient discharge plans. Clinical information faxed to facility and family agreeable with plan. CSW arranged ambulance transport via [facility] to [facility]. RN to call report prior to discharge ([phone]).  CSW will sign off for now as social work intervention is no longer needed. Please consult us again if new needs arise.  Ande Therrell, CSW [phone]   

## 2016-08-26 NOTE — Progress Notes (Signed)
Nsg Discharge Note  Admit Date:  08/24/2016 Discharge date: 08/26/2016   Valentino HueDorcas Abate to be D/C'd Skilled nursing facility per MD order.  AVS completed.  Copy for chart, and copy for patient signed, and dated. Patient/caregiver able to verbalize understanding.  Discharge Medication: Allergies as of 08/26/2016      Reactions   Epinephrine Other (See Comments)   Patient says she is not allergic to epi, but that it makes her jittery.  WILL RECEIVE IF NEEDED IN AN EMERGENCY.      Medication List    TAKE these medications   acetaminophen 500 MG tablet Commonly known as:  TYLENOL Take 500 mg by mouth every 4 (four) hours as needed for fever (pain).   amLODipine 10 MG tablet Commonly known as:  NORVASC Take 10 mg by mouth daily.   atorvastatin 80 MG tablet Commonly known as:  LIPITOR Take 80 mg by mouth at bedtime.   cloNIDine 0.1 MG tablet Commonly known as:  CATAPRES Take 1 tablet (0.1 mg total) by mouth 2 (two) times daily. 8am, 4pm Would stop this on 08/28/2016.   clopidogrel 75 MG tablet Commonly known as:  PLAVIX Take 75 mg by mouth daily.   donepezil 5 MG tablet Commonly known as:  ARICEPT Take 5 mg by mouth at bedtime.   dorzolamide-timolol 22.3-6.8 MG/ML ophthalmic solution Commonly known as:  COSOPT Place 1 drop into both eyes every 12 (twelve) hours.   doxazosin 2 MG tablet Commonly known as:  CARDURA Take 1 tablet (2 mg total) by mouth daily.   furosemide 40 MG tablet Commonly known as:  LASIX Take 1 tablet (40 mg total) by mouth 2 (two) times daily.   hydrALAZINE 100 MG tablet Commonly known as:  APRESOLINE Take 1 tablet (100 mg total) by mouth every 8 (eight) hours.   insulin aspart 100 UNIT/ML injection Commonly known as:  novoLOG Inject 2-12 Units into the skin 4 (four) times daily. Per sliding scale: CBG 151-200 2 units, 201-250 4 units, 251-300 6 units, 301-350 8 units, 351-400 10 units, 401-450 12 units, Call MD for CBG >451 or <60    isosorbide dinitrate 20 MG tablet Commonly known as:  ISORDIL Take 20 mg by mouth 3 (three) times daily. 9am, 1pm, 5pm   latanoprost 0.005 % ophthalmic solution Commonly known as:  XALATAN Place 1 drop into the right eye at bedtime.   levothyroxine 100 MCG tablet Commonly known as:  SYNTHROID, LEVOTHROID Take 1 tablet (100 mcg total) by mouth daily before breakfast.   loperamide 2 MG tablet Commonly known as:  IMODIUM A-D Take 2 mg by mouth every 3 (three) hours as needed for diarrhea or loose stools. Maximum 8 tablets in 24 hours   Melatonin 3 MG Tabs Take 3 mg by mouth at bedtime.   MILK OF MAGNESIA PO Take 30 mLs by mouth at bedtime as needed (constipation).   mirtazapine 15 MG tablet Commonly known as:  REMERON Take 15 mg by mouth at bedtime. For appetite   MYLANTA PO Take 30 mLs by mouth daily as needed (indigestion).   NUTRITIONAL SUPPLEMENT PO Take 90 mLs by mouth 3 (three) times daily. Med Pass   ondansetron 4 MG tablet Commonly known as:  ZOFRAN Take 4 mg by mouth every 6 (six) hours as needed for nausea or vomiting.   OXYGEN Inhale 2 L into the lungs See admin instructions. "Apply 2 liters per minute via nasal canula for shortness of breath"   pantoprazole 20 MG tablet  Commonly known as:  PROTONIX Take 20 mg by mouth daily at 6 (six) AM.   polyvinyl alcohol 1.4 % ophthalmic solution Commonly known as:  LIQUIFILM TEARS Place 1 drop into the left eye 4 (four) times daily.   prednisoLONE acetate 1 % ophthalmic suspension Commonly known as:  PRED FORTE Place 1 drop into the left eye 4 (four) times daily.   sertraline 50 MG tablet Commonly known as:  ZOLOFT Take 50 mg by mouth daily.   Vitamin D3 50000 units Caps Take 50,000 Units by mouth every 30 (thirty) days. Take 1 capsule (50,000 units) by mouth on the 10th of each month       Discharge Assessment: Vitals:   08/25/16 2121 08/26/16 0655  BP: (!) 163/45 (!) 151/52  Pulse: (!) 54 (!) 50   Resp: 18 17  Temp: 98.9 F (37.2 C) 98.2 F (36.8 C)   Skin clean, dry and intact without evidence of skin break down, no evidence of skin tears noted. IV catheter discontinued intact. Site without signs and symptoms of complications - no redness or edema noted at insertion site, patient denies c/o pain - only slight tenderness at site.  Dressing with slight pressure applied.  D/c Instructions-Education: Discharge instructions given to patient/family with verbalized understanding. D/c education completed with patient/family including follow up instructions, medication list, d/c activities limitations if indicated, with other d/c instructions as indicated by MD - patient able to verbalize understanding, all questions fully answered. Patient instructed to return to ED, call 911, or call MD for any changes in condition.  Patient transported via [facility] to [facility]. Report given to receiving nurse.   Camillo FlamingVicki L Tila Millirons, RN 08/26/2016 4:52 PM

## 2016-08-26 NOTE — [place]2 (Signed)
[place] MEDICAID FL2 LEVEL OF CARE SCREENING TOOL     IDENTIFICATION  Patient Name: Taylor HueDorcas Santucci Birthdate: 1946-06-03 Sex: female Admission Date (Current Location): 08/24/2016  [facility] [place]   Facility and Address:  [facility] [place], [address], [place], [place] [address]      Provider Number: [id number]  Attending Physician Name and Address:  Lenox PondsEdwin Silva Zapata, MD  Relative Name and Phone Number:       Current Level of Care: Hospital Recommended Level of Care: Skilled Nursing Facility Prior Approval Number:    Date Approved/Denied:   PASRR Number: [id number]  Discharge Plan: SNF    Current Diagnoses: Patient Active Problem List   Diagnosis Date Noted  . Pressure injury of skin 08/25/2016  . Dyspnea 08/25/2016  . Hyponatremia 08/24/2016  . Shortness of breath 08/24/2016  . Acute renal failure superimposed on stage 4 chronic kidney disease (HCC) 08/21/2016  . Acute on chronic diastolic congestive heart failure (HCC)   . Hypertensive heart disease   . Valvular heart disease   . PAH (pulmonary artery hypertension)   . Normocytic anemia   . Acute encephalopathy 01/18/2016  . Chronic diastolic heart failure (HCC) 12/31/2014  . Staphylococcus aureus bacteremia 11/23/2014  . Fever in adult   . Acute on chronic diastolic heart failure (HCC)   . Coronary artery disease involving native coronary artery of native heart without angina pectoris   . Essential hypertension   . CKD (chronic kidney disease) stage 4, GFR 15-29 ml/min (HCC) 10/07/2014  . Hypothyroidism, acquired 10/07/2014  . Dyslipidemia 10/07/2014  . GERD (gastroesophageal reflux disease) 10/07/2014  . Sinus bradycardia 10/07/2014  . Type 2 diabetes mellitus with diabetic nephropathy, with long-term current use of insulin (HCC)     Orientation RESPIRATION BLADDER Height & Weight     Self, Time, Situation  O2 (Nasal canula 2 L) Incontinent Weight: 128  lb 1.6 oz (58.1 kg) Height:  5\' 1"  (154.9 cm)  BEHAVIORAL SYMPTOMS/MOOD NEUROLOGICAL BOWEL NUTRITION STATUS   (None)  (None) Incontinent Diet (Heart healthy/carb modified)  AMBULATORY STATUS COMMUNICATION OF NEEDS Skin   Extensive Assist Verbally Skin abrasions, Other (Comment) (MASD, Rash, Pressure injury stage 1 mid sacrum foam prn)                       Personal Care Assistance Level of Assistance  Bathing, Feeding, Dressing Bathing Assistance: Limited assistance Feeding assistance: Limited assistance Dressing Assistance: Limited assistance     Functional Limitations Info  Sight, Hearing, Speech Sight Info: Impaired Hearing Info: Adequate Speech Info: Adequate    SPECIAL CARE FACTORS FREQUENCY  PT (By licensed PT), OT (By licensed OT), Blood pressure, Diabetic urine testing     PT Frequency: 5 x week (from previous admission) OT Frequency: 5 x week (from previous admission)            Contractures Contractures Info: Not present    Additional Factors Info  Code Status, Allergies, Isolation Precautions Code Status Info: DNR Allergies Info: Epinephrine     Isolation Precautions Info: Droplet/contact: MRSA     Current Medications (08/26/2016):  This is the current hospital active medication list Current Facility-Administered Medications  Medication Dose Route Frequency Provider Last Rate Last Dose  . acetaminophen (TYLENOL) tablet 650 mg  650 mg Oral Q6H PRN Alberteen Samhristopher P Danford, MD   650 mg at 08/25/16 0008   Or  . acetaminophen (TYLENOL) suppository 650 mg  650 mg  Rectal Q6H PRN Alberteen Sam, MD      . amLODipine (NORVASC) tablet 10 mg  10 mg Oral Daily Alberteen Sam, MD   10 mg at 08/26/16 0859  . atorvastatin (LIPITOR) tablet 80 mg  80 mg Oral QHS Alberteen Sam, MD   80 mg at 08/25/16 2245  . ceFEPIme (MAXIPIME) 1 g in dextrose 5 % 50 mL IVPB  1 g Intravenous Q24H Joseph Art, DO   1 g at 08/26/16 0859  . cloNIDine (CATAPRES)  tablet 0.1 mg  0.1 mg Oral BID Alberteen Sam, MD   0.1 mg at 08/26/16 0900  . clopidogrel (PLAVIX) tablet 75 mg  75 mg Oral Daily Alberteen Sam, MD   75 mg at 08/26/16 0900  . donepezil (ARICEPT) tablet 5 mg  5 mg Oral QHS Alberteen Sam, MD   5 mg at 08/25/16 2258  . dorzolamide (TRUSOPT) 2 % ophthalmic solution 1 drop  1 drop Both Eyes BID Alberteen Sam, MD   1 drop at 08/26/16 0903   And  . timolol (TIMOPTIC) 0.5 % ophthalmic solution 1 drop  1 drop Both Eyes BID Alberteen Sam, MD   1 drop at 08/26/16 0902  . doxazosin (CARDURA) tablet 2 mg  2 mg Oral Daily Alberteen Sam, MD   2 mg at 08/26/16 0900  . feeding supplement (ENSURE ENLIVE) (ENSURE ENLIVE) liquid 237 mL  1 Bottle Oral TID Alberteen Sam, MD   237 mL at 08/25/16 2258  . furosemide (LASIX) tablet 40 mg  40 mg Oral BID Alberteen Sam, MD   40 mg at 08/26/16 0900  . heparin injection 5,000 Units  5,000 Units Subcutaneous Q8H Alberteen Sam, MD   5,000 Units at 08/26/16 1610  . hydrALAZINE (APRESOLINE) tablet 100 mg  100 mg Oral Q8H Alberteen Sam, MD   100 mg at 08/26/16 9604  . insulin aspart (novoLOG) injection 0-5 Units  0-5 Units Subcutaneous QHS Alberteen Sam, MD      . insulin aspart (novoLOG) injection 0-9 Units  0-9 Units Subcutaneous TID WC Alberteen Sam, MD   2 Units at 08/26/16 1220  . isosorbide dinitrate (ISORDIL) tablet 20 mg  20 mg Oral TID Alberteen Sam, MD   20 mg at 08/26/16 0900  . levothyroxine (SYNTHROID, LEVOTHROID) tablet 100 mcg  100 mcg Oral QAC breakfast Alberteen Sam, MD   100 mcg at 08/26/16 0900  . mirtazapine (REMERON) tablet 15 mg  15 mg Oral QHS Alberteen Sam, MD   15 mg at 08/25/16 2245  . ondansetron (ZOFRAN) tablet 4 mg  4 mg Oral Q6H PRN Alberteen Sam, MD       Or  . ondansetron (ZOFRAN) injection 4 mg  4 mg Intravenous Q6H PRN Alberteen Sam, MD      . pantoprazole  (PROTONIX) EC tablet 20 mg  20 mg Oral Q0600 Alberteen Sam, MD   20 mg at 08/26/16 5409  . polyethylene glycol (MIRALAX / GLYCOLAX) packet 17 g  17 g Oral Daily PRN Alberteen Sam, MD      . polyvinyl alcohol (LIQUIFILM TEARS) 1.4 % ophthalmic solution 1 drop  1 drop Left Eye TID AC & HS Alberteen Sam, MD   1 drop at 08/26/16 1221  . prednisoLONE acetate (PRED FORTE) 1 % ophthalmic suspension 1 drop  1 drop Left Eye TID AC & HS Alberteen Sam, MD  1 drop at 08/26/16 1221  . sertraline (ZOLOFT) tablet 50 mg  50 mg Oral Daily Alberteen Samhristopher P Danford, MD   50 mg at 08/26/16 0859  . vancomycin (VANCOCIN) IVPB 1000 mg/200 mL premix  1,000 mg Intravenous Q48H Joseph ArtJessica U Vann, DO         Discharge Medications: Please see discharge summary for a list of discharge medications.  Relevant Imaging Results:  Relevant Lab Results:   Additional Information SS#: [id number]  Margarito LinerSarah C Kesean Serviss, LCSW

## 2016-08-26 NOTE — Discharge Summary (Signed)
Physician Discharge Summary  Taylor Padilla  ONG:[id number]  DOB: [birth year]  DOA: 08/24/2016 PCP: No PCP Per Patient  Admit date: 08/24/2016 Discharge date: 08/26/2016  Admitted From: SNF Disposition:  SNF  Recommendations for Outpatient Follow-up:  1. Follow up with PCP in 1-2 weeks 2. Please obtain BMP/CBC in one week 3. Please follow up on the following pending results: Final blood cultures results    Equipment/Devices: Oxygen supplement 2L [place]   Discharge Condition: Fair  CODE STATUS: DNR  Diet recommendation: Heart Healthy / Carb Modified  Brief/Interim Summary: Taylor Lindseyis a 70 y.o.femalewith a past medical history significant for blindness, diastolic CHF, CAD s/p CABG in 4401, CKD for, baseline creatinine 4, anemia, IDDM, PAH, hypothyroidism and HTNwho presented with fever and SOB from nursing home.  The patient was admitted about a week ago for acute respiratory failure, hypoxia. She was treated with 5 days Levaquin for possible pneumonia, but ultimately was thought to be from acute on chronic diastolic CHF, she was diuresed with IV Lasix and seen by cardiology, did undergo 2 units PRBCs transfusion for anemia, then transitioned to oral Lasix 3 days ago, procalcitonin trended down from 0.47 --> 0.36. She was discharge home as been new oxygen dependent.  When she arrived back to [facility] this afternoon, she complained to staff there of shortness of breath, they obtained a temperature that was 101F, increased her O2 to 4L and sent her back to the Er.  Patient was admitted for SOB, fever and possible pneumonia.   Subjective: Patient seen and examined, cough has decrease, using O2 at baseline. Denies chest pain and palpitations. No acute events overnight. Patient remains afebrile since admission.   Discharge Diagnoses/Hospital course:  Febrile viral syndrome - RSV positive, likely causing viral pneumonia and SOB  Initially not treated with abx, although on day 2  Vanco and Cefepime were given Patient has been on her O2 baseline since admission, also patient was eating without O2 supplementation and saturations remain stable  No indications to continue antibiotics at this time - Procalcitonin continues to trend down 0.25  Encourage oral hydration  Repeat CXR in 3-4 weeks  Symptoms should resolved ~ 7 days   HTN and CAD secondary prevention: Continue amlodipine, Imdur, hydralazine, clonidine Continue new doxazosin Continue statin and Plavix  Chronic disatolic CHF: EF 02-72%.  Continue furosemide BID  Normocytic anemia: Stable  No signs of overt bleed  No need for transfusion at this time  Monitor CBC in 1 week  Anemia work up as outpatient   Hyponatremia: Resolved   IDDM: Resume home medications   CKD IV - Stable Encourage oral hydration Repeat BMP in 1 week   Discharge Instructions  Discharge Instructions    Call MD for:  difficulty breathing, headache or visual disturbances    Complete by:  As directed    Call MD for:  extreme fatigue    Complete by:  As directed    Call MD for:  hives    Complete by:  As directed    Call MD for:  persistant dizziness or light-headedness    Complete by:  As directed    Call MD for:  persistant nausea and vomiting    Complete by:  As directed    Call MD for:  redness, tenderness, or signs of infection (pain, swelling, redness, odor or green/yellow discharge around incision site)    Complete by:  As directed    Call MD for:  severe uncontrolled pain    Complete  by:  As directed    Call MD for:  temperature >100.4    Complete by:  As directed    Diet - low sodium heart healthy    Complete by:  As directed    Discharge instructions    Complete by:  As directed    Increase activity slowly    Complete by:  As directed      Allergies as of 08/26/2016      Reactions   Epinephrine Other (See Comments)   Patient says she is not allergic to epi, but that it makes her jittery.   WILL RECEIVE IF NEEDED IN AN EMERGENCY.      Medication List    TAKE these medications   acetaminophen 500 MG tablet Commonly known as:  TYLENOL Take 500 mg by mouth every 4 (four) hours as needed for fever (pain).   amLODipine 10 MG tablet Commonly known as:  NORVASC Take 10 mg by mouth daily.   atorvastatin 80 MG tablet Commonly known as:  LIPITOR Take 80 mg by mouth at bedtime.   cloNIDine 0.1 MG tablet Commonly known as:  CATAPRES Take 1 tablet (0.1 mg total) by mouth 2 (two) times daily. 8am, 4pm Would stop this on 08/28/2016.   clopidogrel 75 MG tablet Commonly known as:  PLAVIX Take 75 mg by mouth daily.   donepezil 5 MG tablet Commonly known as:  ARICEPT Take 5 mg by mouth at bedtime.   dorzolamide-timolol 22.3-6.8 MG/ML ophthalmic solution Commonly known as:  COSOPT Place 1 drop into both eyes every 12 (twelve) hours.   doxazosin 2 MG tablet Commonly known as:  CARDURA Take 1 tablet (2 mg total) by mouth daily.   furosemide 40 MG tablet Commonly known as:  LASIX Take 1 tablet (40 mg total) by mouth 2 (two) times daily.   hydrALAZINE 100 MG tablet Commonly known as:  APRESOLINE Take 1 tablet (100 mg total) by mouth every 8 (eight) hours.   insulin aspart 100 UNIT/ML injection Commonly known as:  novoLOG Inject 2-12 Units into the skin 4 (four) times daily. Per sliding scale: CBG 151-200 2 units, 201-250 4 units, 251-300 6 units, 301-350 8 units, 351-400 10 units, 401-450 12 units, Call MD for CBG >451 or <60   isosorbide dinitrate 20 MG tablet Commonly known as:  ISORDIL Take 20 mg by mouth 3 (three) times daily. 9am, 1pm, 5pm   latanoprost 0.005 % ophthalmic solution Commonly known as:  XALATAN Place 1 drop into the right eye at bedtime.   levothyroxine 100 MCG tablet Commonly known as:  SYNTHROID, LEVOTHROID Take 1 tablet (100 mcg total) by mouth daily before breakfast.   loperamide 2 MG tablet Commonly known as:  IMODIUM A-D Take 2 mg by  mouth every 3 (three) hours as needed for diarrhea or loose stools. Maximum 8 tablets in 24 hours   Melatonin 3 MG Tabs Take 3 mg by mouth at bedtime.   MILK OF MAGNESIA PO Take 30 mLs by mouth at bedtime as needed (constipation).   mirtazapine 15 MG tablet Commonly known as:  REMERON Take 15 mg by mouth at bedtime. For appetite   MYLANTA PO Take 30 mLs by mouth daily as needed (indigestion).   NUTRITIONAL SUPPLEMENT PO Take 90 mLs by mouth 3 (three) times daily. Med Pass   ondansetron 4 MG tablet Commonly known as:  ZOFRAN Take 4 mg by mouth every 6 (six) hours as needed for nausea or vomiting.   OXYGEN Inhale 2  L into the lungs See admin instructions. "Apply 2 liters per minute via nasal canula for shortness of breath"   pantoprazole 20 MG tablet Commonly known as:  PROTONIX Take 20 mg by mouth daily at 6 (six) AM.   polyvinyl alcohol 1.4 % ophthalmic solution Commonly known as:  LIQUIFILM TEARS Place 1 drop into the left eye 4 (four) times daily.   prednisoLONE acetate 1 % ophthalmic suspension Commonly known as:  PRED FORTE Place 1 drop into the left eye 4 (four) times daily.   sertraline 50 MG tablet Commonly known as:  ZOLOFT Take 50 mg by mouth daily.   Vitamin D3 50000 units Caps Take 50,000 Units by mouth every 30 (thirty) days. Take 1 capsule (50,000 units) by mouth on the 10th of each month       Allergies  Allergen Reactions  . Epinephrine Other (See Comments)    Patient says she is not allergic to epi, but that it makes her jittery.  WILL RECEIVE IF NEEDED IN AN EMERGENCY.    Consultations:  None    Procedures/Studies: Dg Chest 2 View  Result Date: 08/19/2016 CLINICAL DATA:  [facility]- pt coming from [facility] nursing facility. Initially resp distress, on NRB at 98%. Pt on CPAP on on arrival. Hx of CHF, GERD, hypertension, stroke, myocardia infarction, and pneumonia. Hx of CABG in 2003. EXAM: CHEST - 2 VIEW COMPARISON:  08/18/2016 FINDINGS:  Mild interstitial edema and central below vascular congestion, improved since previous. Small pleural effusions left greater than right, improved. Mild cardiomegaly.  Atheromatous aorta. Previous CABG. IMPRESSION: 1. Improving interstitial edema and pleural effusions. Electronically Signed   By: Corlis Leak  Hassell M.D.   On: 08/19/2016 13:54   Nm Pulmonary Perf And Vent  Result Date: 08/19/2016 CLINICAL DATA:  Respiratory failure EXAM: NUCLEAR MEDICINE VENTILATION - PERFUSION LUNG SCAN TECHNIQUE: Ventilation images were obtained in multiple projections using inhaled aerosol Tc-9472m DTPA. Perfusion images were obtained in multiple projections after intravenous injection of Tc-1372m MAA. RADIOPHARMACEUTICALS:  31.0 mCi Technetium-872m DTPA aerosol inhalation and 4.3 mCi Technetium-472m MAA IV COMPARISON:  Chest x-ray 08/18/2016 FINDINGS: Ventilation: Patchy ventilation defects throughout the lungs, best seen in the right lung Perfusion: No wedge shaped peripheral perfusion defects to suggest acute pulmonary embolism. IMPRESSION: No perfusion defects to suggest pulmonary embolus. Electronically Signed   By: Charlett NoseKevin  Dover M.D.   On: 08/19/2016 13:48   Dg Chest Port 1 View  Result Date: 08/24/2016 CLINICAL DATA:  Shortness of breath x2 days EXAM: PORTABLE CHEST 1 VIEW COMPARISON:  08/19/2016 FINDINGS: Mild patchy right lower lobe opacity, suspicious for pneumonia. Mild left basilar opacity, likely atelectasis. No pleural effusion or pneumothorax. Cardiomegaly.  Postsurgical changes related to prior CABG. IMPRESSION: Mild patchy right lower lobe opacity, suspicious for pneumonia. Electronically Signed   By: Charline BillsSriyesh  Krishnan M.D.   On: 08/24/2016 17:56   Dg Chest Portable 1 View  Result Date: 08/18/2016 CLINICAL DATA:  Shortness of breath. EXAM: PORTABLE CHEST 1 VIEW COMPARISON:  One-view chest x-ray 01/15/2016. FINDINGS: The heart size is upper limits of normal. This is exaggerated by low lung volumes. Moderate edema  and bilateral pleural effusions are new. Bibasilar airspace disease likely reflects atelectasis. Atherosclerotic calcifications are again noted at the aortic arch. Degenerate changes are noted the shoulder and thoracic spine. The visualized soft tissues and bony thorax are otherwise unremarkable. IMPRESSION: 1. Congestive heart failure. 2. Bibasilar airspace disease likely reflects atelectasis. Infection is not excluded. 3. Aortic atherosclerosis. Electronically Signed   By: Cristal Deerhristopher  Mattern M.D.   On: 08/18/2016 11:08     Discharge Exam: Vitals:   08/25/16 2121 08/26/16 0655  BP: (!) 163/45 (!) 151/52  Pulse: (!) 54 (!) 50  Resp: 18 17  Temp: 98.9 F (37.2 C) 98.2 F (36.8 C)   Vitals:   08/25/16 0529 08/25/16 1416 08/25/16 2121 08/26/16 0655  BP:  (!) 147/44 (!) 163/45 (!) 151/52  Pulse: (!) 114 (!) 50 (!) 54 (!) 50  Resp:  17 18 17   Temp:  98.6 F (37 C) 98.9 F (37.2 C) 98.2 F (36.8 C)  TempSrc:  Oral Oral Oral  SpO2: 99% 93% 100% 98%  Weight:    58.1 kg (128 lb 1.6 oz)  Height:        General: Pt is alert, awake, not in acute distress Cardiovascular: RRR, S1/S2 +, no rubs, no gallops Respiratory: CTA bilaterally, no wheezing, no rhonchi Extremities: no edema, no cyanosis    The results of significant diagnostics from this hospitalization (including imaging, microbiology, ancillary and laboratory) are listed below for reference.     Microbiology: Recent Results (from the past 240 hour(s))  MRSA PCR Screening     Status: Abnormal   Collection Time: 08/18/16  2:36 PM  Result Value Ref Range Status   MRSA by PCR POSITIVE (A) NEGATIVE Final    Comment:        The GeneXpert MRSA Assay (FDA approved for NASAL specimens only), is one component of a comprehensive MRSA colonization surveillance program. It is not intended to diagnose MRSA infection nor to guide or monitor treatment for MRSA infections. RESULT CALLED TO, READ BACK BY AND VERIFIED WITH: Yevonne AlineL. King  RN 16:20 08/18/16 ([email or web address])   Culture, blood (Routine X 2) w Reflex to ID Panel     Status: None   Collection Time: 08/18/16  4:15 PM  Result Value Ref Range Status   Specimen Description BLOOD LEFT ANTECUBITAL  Final   Special Requests IN PEDIATRIC BOTTLE 2CC  Final   Culture NO GROWTH 5 DAYS  Final   Report Status 08/23/2016 FINAL  Final  Culture, blood (Routine X 2) w Reflex to ID Panel     Status: None   Collection Time: 08/18/16  4:21 PM  Result Value Ref Range Status   Specimen Description BLOOD RIGHT HAND  Final   Special Requests BOTTLES DRAWN AEROBIC ONLY 5CC  Final   Culture NO GROWTH 5 DAYS  Final   Report Status 08/23/2016 FINAL  Final  Culture, sputum-assessment     Status: None   Collection Time: 08/24/16  9:07 PM  Result Value Ref Range Status   Specimen Description EXPECTORATED SPUTUM  Final   Special Requests NONE  Final   Sputum evaluation THIS SPECIMEN IS ACCEPTABLE FOR SPUTUM CULTURE  Final   Report Status 08/24/2016 FINAL  Final  Culture, respiratory (NON-Expectorated)     Status: None (Preliminary result)   Collection Time: 08/24/16  9:07 PM  Result Value Ref Range Status   Specimen Description EXPECTORATED SPUTUM  Final   Special Requests NONE Reflexed from [id number]  Final   Gram Stain   Final    ABUNDANT WBC PRESENT,BOTH PMN AND MONONUCLEAR FEW GRAM POSITIVE COCCI IN PAIRS FEW GRAM POSITIVE RODS FEW GRAM NEGATIVE RODS RARE GRAM NEGATIVE COCCI IN PAIRS    Culture CULTURE REINCUBATED FOR BETTER GROWTH  Final   Report Status PENDING  Incomplete  Respiratory Panel by PCR     Status: Abnormal   Collection Time: 08/25/16  2:56 AM  Result Value Ref Range Status   Adenovirus NOT DETECTED NOT DETECTED Final   Coronavirus 229E NOT DETECTED NOT DETECTED Final   Coronavirus HKU1 NOT DETECTED NOT DETECTED Final   Coronavirus NL63 NOT DETECTED NOT DETECTED Final   Coronavirus OC43 NOT DETECTED NOT DETECTED Final   Metapneumovirus NOT DETECTED NOT DETECTED Final    Rhinovirus / Enterovirus NOT DETECTED NOT DETECTED Final   Influenza A NOT DETECTED NOT DETECTED Final   Influenza B NOT DETECTED NOT DETECTED Final   Parainfluenza Virus 1 NOT DETECTED NOT DETECTED Final   Parainfluenza Virus 2 NOT DETECTED NOT DETECTED Final   Parainfluenza Virus 3 NOT DETECTED NOT DETECTED Final   Parainfluenza Virus 4 NOT DETECTED NOT DETECTED Final   Respiratory Syncytial Virus DETECTED (A) NOT DETECTED Final    Comment: CRITICAL RESULT CALLED TO, READ BACK BY AND VERIFIED WITH: Rivka Safer RN 13:40 08/25/16 ([email or web address])    Bordetella pertussis NOT DETECTED NOT DETECTED Final   Chlamydophila pneumoniae NOT DETECTED NOT DETECTED Final   Mycoplasma pneumoniae NOT DETECTED NOT DETECTED Final  Culture, blood (routine x 2)     Status: None (Preliminary result)   Collection Time: 08/25/16  5:13 AM  Result Value Ref Range Status   Specimen Description BLOOD LEFT ARM  Final   Special Requests BOTTLES DRAWN AEROBIC AND ANAEROBIC  Final   Culture NO GROWTH 1 DAY  Final   Report Status PENDING  Incomplete  Culture, blood (routine x 2)     Status: None (Preliminary result)   Collection Time: 08/25/16  5:20 AM  Result Value Ref Range Status   Specimen Description BLOOD LEFT FOREARM  Final   Special Requests AEROBIC BOTTLE ONLY  Final   Culture NO GROWTH 1 DAY  Final   Report Status PENDING  Incomplete     Labs: BNP (last 3 results)  Recent Labs  01/15/16 2033 08/18/16 1615 08/24/16 1740  BNP 169.8* 1,537.5* 1,397.0*   Basic Metabolic Panel:  Recent Labs Lab 08/23/16 0410 08/24/16 0458 08/24/16 1740 08/25/16 0458 08/26/16 0446  NA 138 137 132* 133* 136  K 3.9 3.9 3.8 3.6 3.3*  CL 104 101 97* 97* 99*  CO2 24 24 24 24 24   GLUCOSE 127* 86 110* 109* 142*  BUN 44* 45* 48* 50* 53*  CREATININE 3.82* 3.99* 4.12* 4.20* 4.28*  CALCIUM 8.4* 8.3* 8.2* 8.5* 8.2*  MG 2.0  --   --   --   --    Liver Function Tests:  Recent Labs Lab 08/20/16 0417  08/24/16 1740  AST 16 21  ALT 10* 10*  ALKPHOS 69 62  BILITOT 1.0 0.7  PROT 6.3* 6.0*  ALBUMIN 2.9* 2.9*   No results for input(s): LIPASE, AMYLASE in the last 168 hours. No results for input(s): AMMONIA in the last 168 hours. CBC:  Recent Labs Lab 08/20/16 0417 08/23/16 0410 08/24/16 0458 08/24/16 1740 08/25/16 0458 08/26/16 0446  WBC 7.5 4.9 7.6 7.1 5.7 6.2  NEUTROABS 6.1  --   --  6.0  --   --   HGB 9.6* 9.6* 9.2* 8.7* 9.0* 8.9*  HCT 29.9* 30.5* 28.9* 27.1* 27.6* 27.6*  MCV 90.9 93.3 92.6 90.0 89.6 89.0  PLT 253 251 226 228 203 189   Cardiac Enzymes:  Recent Labs Lab 08/25/16 0458  TROPONINI 0.03*   BNP: Invalid input(s): POCBNP CBG:  Recent Labs Lab 08/25/16 0752 08/25/16 1211 08/25/16 1709 08/26/16 0745 08/26/16 1154  GLUCAP 120*  166* 97 146* 190*   D-Dimer No results for input(s): DDIMER in the last 72 hours. Hgb A1c No results for input(s): HGBA1C in the last 72 hours. Lipid Profile No results for input(s): CHOL, HDL, LDLCALC, TRIG, CHOLHDL, LDLDIRECT in the last 72 hours. Thyroid function studies No results for input(s): TSH, T4TOTAL, T3FREE, THYROIDAB in the last 72 hours.  Invalid input(s): FREET3 Anemia work up No results for input(s): VITAMINB12, FOLATE, FERRITIN, TIBC, IRON, RETICCTPCT in the last 72 hours. Urinalysis    Component Value Date/Time   COLORURINE YELLOW 01/15/2016 2112   APPEARANCEUR TURBID (A) 01/15/2016 2112   LABSPEC 1.015 01/15/2016 2112   PHURINE 5.0 01/15/2016 2112   GLUCOSEU 250 (A) 01/15/2016 2112   HGBUR LARGE (A) 01/15/2016 2112   BILIRUBINUR NEGATIVE 01/15/2016 2112   KETONESUR NEGATIVE 01/15/2016 2112   PROTEINUR 100 (A) 01/15/2016 2112   UROBILINOGEN 0.2 07/10/2015 0401   NITRITE NEGATIVE 01/15/2016 2112   LEUKOCYTESUR LARGE (A) 01/15/2016 2112   Sepsis Labs Invalid input(s): PROCALCITONIN,  WBC,  LACTICIDVEN Microbiology Recent Results (from the past 240 hour(s))  MRSA PCR Screening     Status:  Abnormal   Collection Time: 08/18/16  2:36 PM  Result Value Ref Range Status   MRSA by PCR POSITIVE (A) NEGATIVE Final    Comment:        The GeneXpert MRSA Assay (FDA approved for NASAL specimens only), is one component of a comprehensive MRSA colonization surveillance program. It is not intended to diagnose MRSA infection nor to guide or monitor treatment for MRSA infections. RESULT CALLED TO, READ BACK BY AND VERIFIED WITH: Yevonne Aline RN 16:20 08/18/16 ([email or web address])   Culture, blood (Routine X 2) w Reflex to ID Panel     Status: None   Collection Time: 08/18/16  4:15 PM  Result Value Ref Range Status   Specimen Description BLOOD LEFT ANTECUBITAL  Final   Special Requests IN PEDIATRIC BOTTLE 2CC  Final   Culture NO GROWTH 5 DAYS  Final   Report Status 08/23/2016 FINAL  Final  Culture, blood (Routine X 2) w Reflex to ID Panel     Status: None   Collection Time: 08/18/16  4:21 PM  Result Value Ref Range Status   Specimen Description BLOOD RIGHT HAND  Final   Special Requests BOTTLES DRAWN AEROBIC ONLY 5CC  Final   Culture NO GROWTH 5 DAYS  Final   Report Status 08/23/2016 FINAL  Final  Culture, sputum-assessment     Status: None   Collection Time: 08/24/16  9:07 PM  Result Value Ref Range Status   Specimen Description EXPECTORATED SPUTUM  Final   Special Requests NONE  Final   Sputum evaluation THIS SPECIMEN IS ACCEPTABLE FOR SPUTUM CULTURE  Final   Report Status 08/24/2016 FINAL  Final  Culture, respiratory (NON-Expectorated)     Status: None (Preliminary result)   Collection Time: 08/24/16  9:07 PM  Result Value Ref Range Status   Specimen Description EXPECTORATED SPUTUM  Final   Special Requests NONE Reflexed from [id number]  Final   Gram Stain   Final    ABUNDANT WBC PRESENT,BOTH PMN AND MONONUCLEAR FEW GRAM POSITIVE COCCI IN PAIRS FEW GRAM POSITIVE RODS FEW GRAM NEGATIVE RODS RARE GRAM NEGATIVE COCCI IN PAIRS    Culture CULTURE REINCUBATED FOR BETTER GROWTH  Final    Report Status PENDING  Incomplete  Respiratory Panel by PCR     Status: Abnormal   Collection Time: 08/25/16  2:56 AM  Result Value  Ref Range Status   Adenovirus NOT DETECTED NOT DETECTED Final   Coronavirus 229E NOT DETECTED NOT DETECTED Final   Coronavirus HKU1 NOT DETECTED NOT DETECTED Final   Coronavirus NL63 NOT DETECTED NOT DETECTED Final   Coronavirus OC43 NOT DETECTED NOT DETECTED Final   Metapneumovirus NOT DETECTED NOT DETECTED Final   Rhinovirus / Enterovirus NOT DETECTED NOT DETECTED Final   Influenza A NOT DETECTED NOT DETECTED Final   Influenza B NOT DETECTED NOT DETECTED Final   Parainfluenza Virus 1 NOT DETECTED NOT DETECTED Final   Parainfluenza Virus 2 NOT DETECTED NOT DETECTED Final   Parainfluenza Virus 3 NOT DETECTED NOT DETECTED Final   Parainfluenza Virus 4 NOT DETECTED NOT DETECTED Final   Respiratory Syncytial Virus DETECTED (A) NOT DETECTED Final    Comment: CRITICAL RESULT CALLED TO, READ BACK BY AND VERIFIED WITH: Rivka Safer RN 13:40 08/25/16 ([email or web address])    Bordetella pertussis NOT DETECTED NOT DETECTED Final   Chlamydophila pneumoniae NOT DETECTED NOT DETECTED Final   Mycoplasma pneumoniae NOT DETECTED NOT DETECTED Final  Culture, blood (routine x 2)     Status: None (Preliminary result)   Collection Time: 08/25/16  5:13 AM  Result Value Ref Range Status   Specimen Description BLOOD LEFT ARM  Final   Special Requests BOTTLES DRAWN AEROBIC AND ANAEROBIC  Final   Culture NO GROWTH 1 DAY  Final   Report Status PENDING  Incomplete  Culture, blood (routine x 2)     Status: None (Preliminary result)   Collection Time: 08/25/16  5:20 AM  Result Value Ref Range Status   Specimen Description BLOOD LEFT FOREARM  Final   Special Requests AEROBIC BOTTLE ONLY  Final   Culture NO GROWTH 1 DAY  Final   Report Status PENDING  Incomplete     Time coordinating discharge: Over 30 minutes  SIGNED:  Latrelle Dodrill, MD  [facility] 08/26/2016, 2:38  PM Pager   If 7PM-7AM, please contact night-coverage [email or web address] Password [email or web address]

## 2016-08-26 NOTE — Clinical Social Work Note (Addendum)
[facility] can take patient back today. Patient's HCPOA will call admissions coordinator regarding paperwork.  Charlynn CourtSarah Xavier Munger, CSW [phone]  3:08 pm Per admissions coordinator, patient HCPOA will be about SNF in about 45 minutes to complete paperwork. Patient cannot transport until paperwork is completed.  Charlynn CourtSarah Sashay Felling, CSW [phone]

## 2016-08-27 LAB — CULTURE, RESPIRATORY W GRAM STAIN

## 2016-08-27 LAB — CULTURE, RESPIRATORY: CULTURE: NORMAL

## 2016-08-30 LAB — CULTURE, BLOOD (ROUTINE X 2)
Culture: NO GROWTH
Culture: NO GROWTH

## 2016-09-15 ENCOUNTER — Ambulatory Visit (HOSPITAL_COMMUNITY)
Admission: RE | Admit: 2016-09-15 | Discharge: 2016-09-15 | Disposition: A | Discharge status: Home / Self Care | Payer: Medicare Other | Source: Ambulatory Visit | Attending: Internal Medicine | Admitting: Internal Medicine

## 2016-09-15 VITALS — BP 116/58 | HR 64

## 2016-09-15 DIAGNOSIS — Z8673 Personal history of transient ischemic attack (TIA), and cerebral infarction without residual deficits: Secondary | ICD-10-CM | POA: Diagnosis not present

## 2016-09-15 DIAGNOSIS — Z951 Presence of aortocoronary bypass graft: Secondary | ICD-10-CM | POA: Insufficient documentation

## 2016-09-15 DIAGNOSIS — Z7902 Long term (current) use of antithrombotics/antiplatelets: Secondary | ICD-10-CM | POA: Diagnosis not present

## 2016-09-15 DIAGNOSIS — Z794 Long term (current) use of insulin: Secondary | ICD-10-CM | POA: Insufficient documentation

## 2016-09-15 DIAGNOSIS — K219 Gastro-esophageal reflux disease without esophagitis: Secondary | ICD-10-CM | POA: Insufficient documentation

## 2016-09-15 DIAGNOSIS — H409 Unspecified glaucoma: Secondary | ICD-10-CM | POA: Insufficient documentation

## 2016-09-15 DIAGNOSIS — I5032 Chronic diastolic (congestive) heart failure: Secondary | ICD-10-CM | POA: Diagnosis present

## 2016-09-15 DIAGNOSIS — D649 Anemia, unspecified: Secondary | ICD-10-CM | POA: Insufficient documentation

## 2016-09-15 DIAGNOSIS — Z7982 Long term (current) use of aspirin: Secondary | ICD-10-CM | POA: Insufficient documentation

## 2016-09-15 DIAGNOSIS — H548 Legal blindness, as defined in USA: Secondary | ICD-10-CM | POA: Diagnosis not present

## 2016-09-15 DIAGNOSIS — E039 Hypothyroidism, unspecified: Secondary | ICD-10-CM | POA: Insufficient documentation

## 2016-09-15 DIAGNOSIS — N185 Chronic kidney disease, stage 5: Secondary | ICD-10-CM | POA: Insufficient documentation

## 2016-09-15 DIAGNOSIS — Z9889 Other specified postprocedural states: Secondary | ICD-10-CM | POA: Insufficient documentation

## 2016-09-15 DIAGNOSIS — I132 Hypertensive heart and chronic kidney disease with heart failure and with stage 5 chronic kidney disease, or end stage renal disease: Secondary | ICD-10-CM | POA: Diagnosis not present

## 2016-09-15 DIAGNOSIS — E785 Hyperlipidemia, unspecified: Secondary | ICD-10-CM | POA: Diagnosis not present

## 2016-09-15 DIAGNOSIS — E1122 Type 2 diabetes mellitus with diabetic chronic kidney disease: Secondary | ICD-10-CM | POA: Insufficient documentation

## 2016-09-15 DIAGNOSIS — I2510 Atherosclerotic heart disease of native coronary artery without angina pectoris: Secondary | ICD-10-CM | POA: Diagnosis not present

## 2016-09-15 NOTE — Patient Instructions (Signed)
We will contact you in 6 months to schedule your next appointment.  

## 2016-09-15 NOTE — Progress Notes (Signed)
Advanced Heart Failure Clinic Note   PCP: Dr. Donette Larry -> comes to facility Primary HF: Dr Gala Romney  Patient ID: Taylor Padilla, female   DOB: 1946-06-09, 71 y.o.   MRN: [id number] 71 yo with history of CAD s/p CABG, chronic diastolic CHF, CKD  Has had 2 admission in past 6 months. 12/21 for febrile viral syndrome with + RSV and A/C respo failure thought to be 2/2 to CAP on 08/18/16.   She presents today for post hospital follow up.  Last seen in clinic 07/2015. Still lives at [facility] in long term care.  She is legally blind.  Hasn't had any falls.  States she feels back up to 90% of her normal.  Did not feel good after her first admission in December, but much better since second.  Unsure of weight. Last weight on documentation by facility 122 lbs.  Has some one help her with all daily activities including bathing and getting dressed. Denies SOB with these. Occassionally orthopnea, but not since leaving hospital. All medications administered by facility.   Echo 08/18/16 LVEF 50-55%, moderate MR, Sevre LAE, Moderate TI. PA peak pressure 83 mm Hg  Labs (3/16): K 4, creatinine 2.69 Labs (12/07/2014) SPEP M Spike noted  Labs 07/12/15 K 4.1, Creatinine 2.48 Labs 08/26/16 K 3.3, Creatinine 4.28  PMH: 1. GERD 2. Hyperlipidemia 3. HTN: Renal artery dopplers 2/16 did not show significant renal artery stenosis.  4. Type II diabetes 5. Glaucoma: Legally blind. 6. CKD 7. Anemia 8. Hypothyroidism 9. CAD: CABG x 4 2003 in [place].  10. Chronic diastolic CHF: Echo (2/16) with EF 60-65%, restrictive diastolic function, moderate MR, severe LAE, mildly dilated RV with normal systolic function, PA systolic pressure 48 mmHg.  11. Sinus bradycardia 12. CVA  SH: Nonsmoker, widow, lives in assisted living in [place].   FH: No premature CAD.   ROS: All systems reviewed and negative except as per HPI.   Current Outpatient Prescriptions  Medication Sig Dispense Refill  . acetaminophen  (TYLENOL) 500 MG tablet Take 500 mg by mouth every 4 (four) hours as needed for fever (pain).     Marland Kitchen amLODipine (NORVASC) 10 MG tablet Take 10 mg by mouth daily.    Marland Kitchen atorvastatin (LIPITOR) 80 MG tablet Take 80 mg by mouth at bedtime.    . Cholecalciferol (VITAMIN D3) 50000 units CAPS Take 50,000 Units by mouth every 30 (thirty) days. Take 1 capsule (50,000 units) by mouth on the 10th of each month    . clopidogrel (PLAVIX) 75 MG tablet Take 75 mg by mouth daily.    Marland Kitchen donepezil (ARICEPT) 5 MG tablet Take 5 mg by mouth at bedtime.    . dorzolamide-timolol (COSOPT) 22.3-6.8 MG/ML ophthalmic solution Place 1 drop into both eyes every 12 (twelve) hours.    Marland Kitchen doxazosin (CARDURA) 2 MG tablet Take 1 tablet (2 mg total) by mouth daily. 30 tablet 0  . furosemide (LASIX) 40 MG tablet Take 1 tablet (40 mg total) by mouth 2 (two) times daily. 60 tablet 0  . hydrALAZINE (APRESOLINE) 100 MG tablet Take 1 tablet (100 mg total) by mouth every 8 (eight) hours. 90 tablet 0  . insulin aspart (NOVOLOG) 100 UNIT/ML injection Inject 2-12 Units into the skin 4 (four) times daily. Per sliding scale: CBG 151-200 2 units, 201-250 4 units, 251-300 6 units, 301-350 8 units, 351-400 10 units, 401-450 12 units, Call MD for CBG >451 or <60    . isosorbide dinitrate (ISORDIL) 20 MG tablet Take 20 mg  by mouth 3 (three) times daily. 9am, 1pm, 5pm    . latanoprost (XALATAN) 0.005 % ophthalmic solution Place 1 drop into the right eye at bedtime.    Marland Kitchen levothyroxine (SYNTHROID, LEVOTHROID) 100 MCG tablet Take 1 tablet (100 mcg total) by mouth daily before breakfast. 30 tablet 0  . loperamide (IMODIUM A-D) 2 MG tablet Take 2 mg by mouth every 3 (three) hours as needed for diarrhea or loose stools. Maximum 8 tablets in 24 hours    . Magnesium Hydroxide (MILK OF MAGNESIA PO) Take 30 mLs by mouth at bedtime as needed (constipation).    . Melatonin 3 MG TABS Take 3 mg by mouth at bedtime.    . mirtazapine (REMERON) 15 MG tablet Take 15 mg by  mouth at bedtime. For appetite    . Nutritional Supplements (NUTRITIONAL SUPPLEMENT PO) Take 90 mLs by mouth 3 (three) times daily. Med Pass    . ondansetron (ZOFRAN) 4 MG tablet Take 4 mg by mouth every 6 (six) hours as needed for nausea or vomiting.    . OXYGEN Inhale 2 L into the lungs See admin instructions. "Apply 2 liters per minute via nasal canula for shortness of breath"    . pantoprazole (PROTONIX) 20 MG tablet Take 20 mg by mouth daily at 6 (six) AM.     . polyvinyl alcohol (LIQUIFILM TEARS) 1.4 % ophthalmic solution Place 1 drop into the left eye 4 (four) times daily.    . prednisoLONE acetate (PRED FORTE) 1 % ophthalmic suspension Place 1 drop into the left eye 4 (four) times daily.    . sertraline (ZOLOFT) 50 MG tablet Take 50 mg by mouth daily.     No current facility-administered medications for this encounter.    BP (!) 116/58   Pulse 64   SpO2 98% Comment: 2L   Wt Readings from Last 3 Encounters:  08/26/16 128 lb 1.6 oz (58.1 kg)  08/24/16 130 lb 9.6 oz (59.2 kg)  01/15/16 131 lb 9.8 oz (59.7 kg)     General: Elderly, NAD. In WC.  HEENT: Normal except for blindness.  Neck: JVD does not appear elevated, no thyromegaly or lymphadenopathy  Lungs: Mildly diminished basilar sounds.  CV: Nondisplaced PMI.  Heart regular S1/S2, no S3/S4, 2/6 HSM at apex.    Abdomen: Soft, NT, ND, no HSM. No bruits or masses. +BS  Skin: Intact without lesions or rashes.  Neurologic: Alert and oriented x 3. Moves all 4 extremities without difficulty Psych: Normal affect. Extremities: No clubbing or cyanosis. No peripheral edema.  HEENT: Normal.   Assessment/Plan: 1. Chronic diastolic CHF, NYHA class II-III.  Echo 08/18/16 LVEF 50-55%, moderate MR, Sevre LAE, Moderate TI. PA peak pressure 83 mm Hg - Continue Lasix 40 mg BID. Facility can administer extra as needed.  - Off spiro since last visit. No room to add back with CKD.  - Continue hydralazine 100 mg TID and Imdur 20 mg TID.  -  Minimal M-Spike on SPEP in past.  - Should be weighing daily.  2. Sinus bradycardia:  - Stable. No need for PPM at this point.   3. CKD Stage IV-V - Needs to see renal.   4. CAD: S/p CABG.   - No CP.  - Continue ASA 81 mg daily, Plavix 75 mg daily, atorvastatin 80 mg qhs.  5. HTN:  - Stable on current regimen as above.  6. Hyperlipidemia  - Per PCP.   BMET/BNP today.  Needs Renal follow up.  Follow  up 6 months.    [name], PA-C  09/15/2016   Patient seen and examined with Otilio SaberAndy Tillery, PA-C. We discussed all aspects of the encounter. I agree with the assessment and plan as stated above.   Doing well from a HF perspective. Volume status ok. Main issue is worsening renal function. Labs do not appear pre-renal sow ill not cut back diuretics at this time. Will refer to Nephrology. Can f/u in 6 months.   Thao Bauza,MD 11:04 AM

## 2016-12-22 ENCOUNTER — Other Ambulatory Visit (HOSPITAL_COMMUNITY): Payer: Self-pay | Admitting: *Deleted

## 2016-12-25 ENCOUNTER — Inpatient Hospital Stay (HOSPITAL_COMMUNITY): Admission: RE | Admit: 2016-12-25 | Payer: Self-pay | Source: Ambulatory Visit

## 2017-01-22 IMAGING — CR DG CHEST 1V PORT
1 series · 1 of 1 positions shown · non-contrast
Comparison: None available

CLINICAL DATA: Shortness of breath

EXAM:
PORTABLE CHEST - 1 VIEW

[AP]
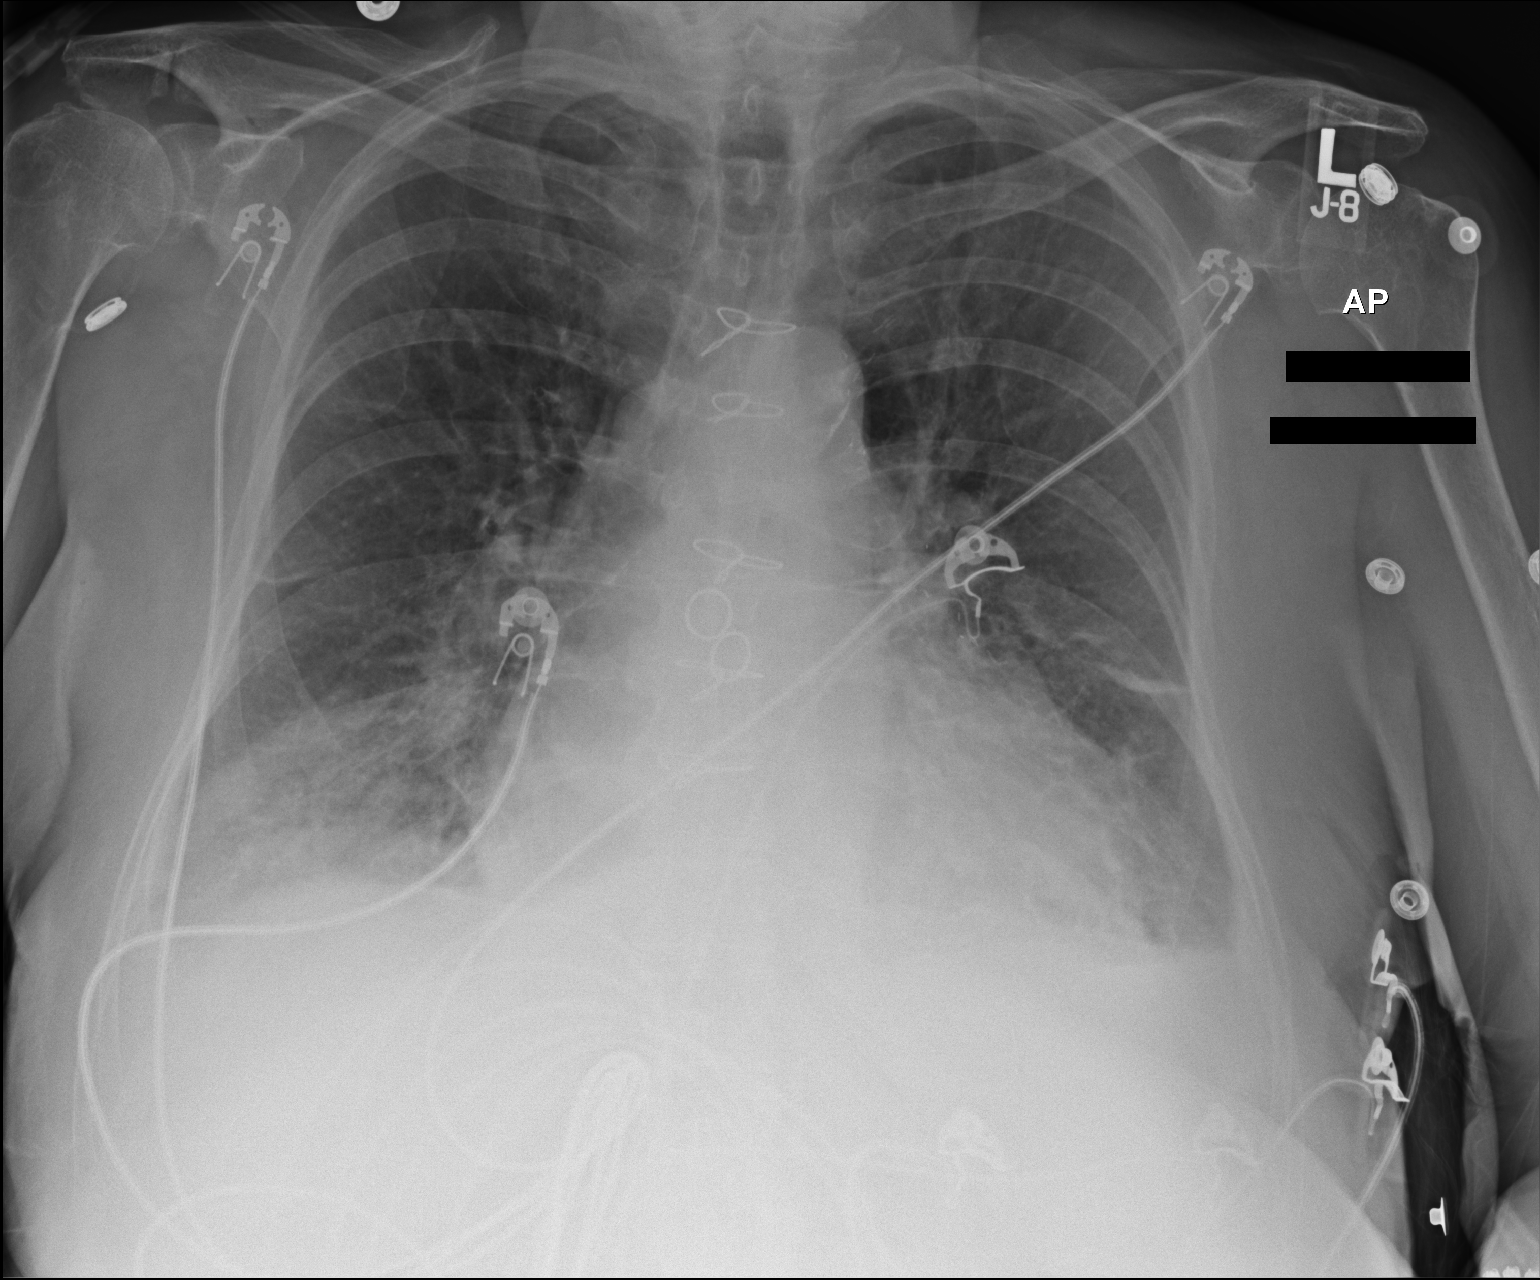

[1 of 1 positions shown; findings below may reference images not displayed]

FINDINGS: Previous CABG. Interstitial and airspace opacities in both lung
bases, right greater than left. Small pleural effusions. Mild
cardiomegaly. Atheromatous aorta.
No pneumothorax.
IMPRESSION: 1. Mild cardiomegaly with bibasilar infiltrate/edema and small
effusions, suspect CHF.

## 2017-01-23 IMAGING — CR DG CHEST 1V PORT
1 series · 1 of 1 positions shown · non-contrast
Comparison: 10/07/2014.

CLINICAL DATA: CHF

EXAM:
PORTABLE CHEST - 1 VIEW

[AP]
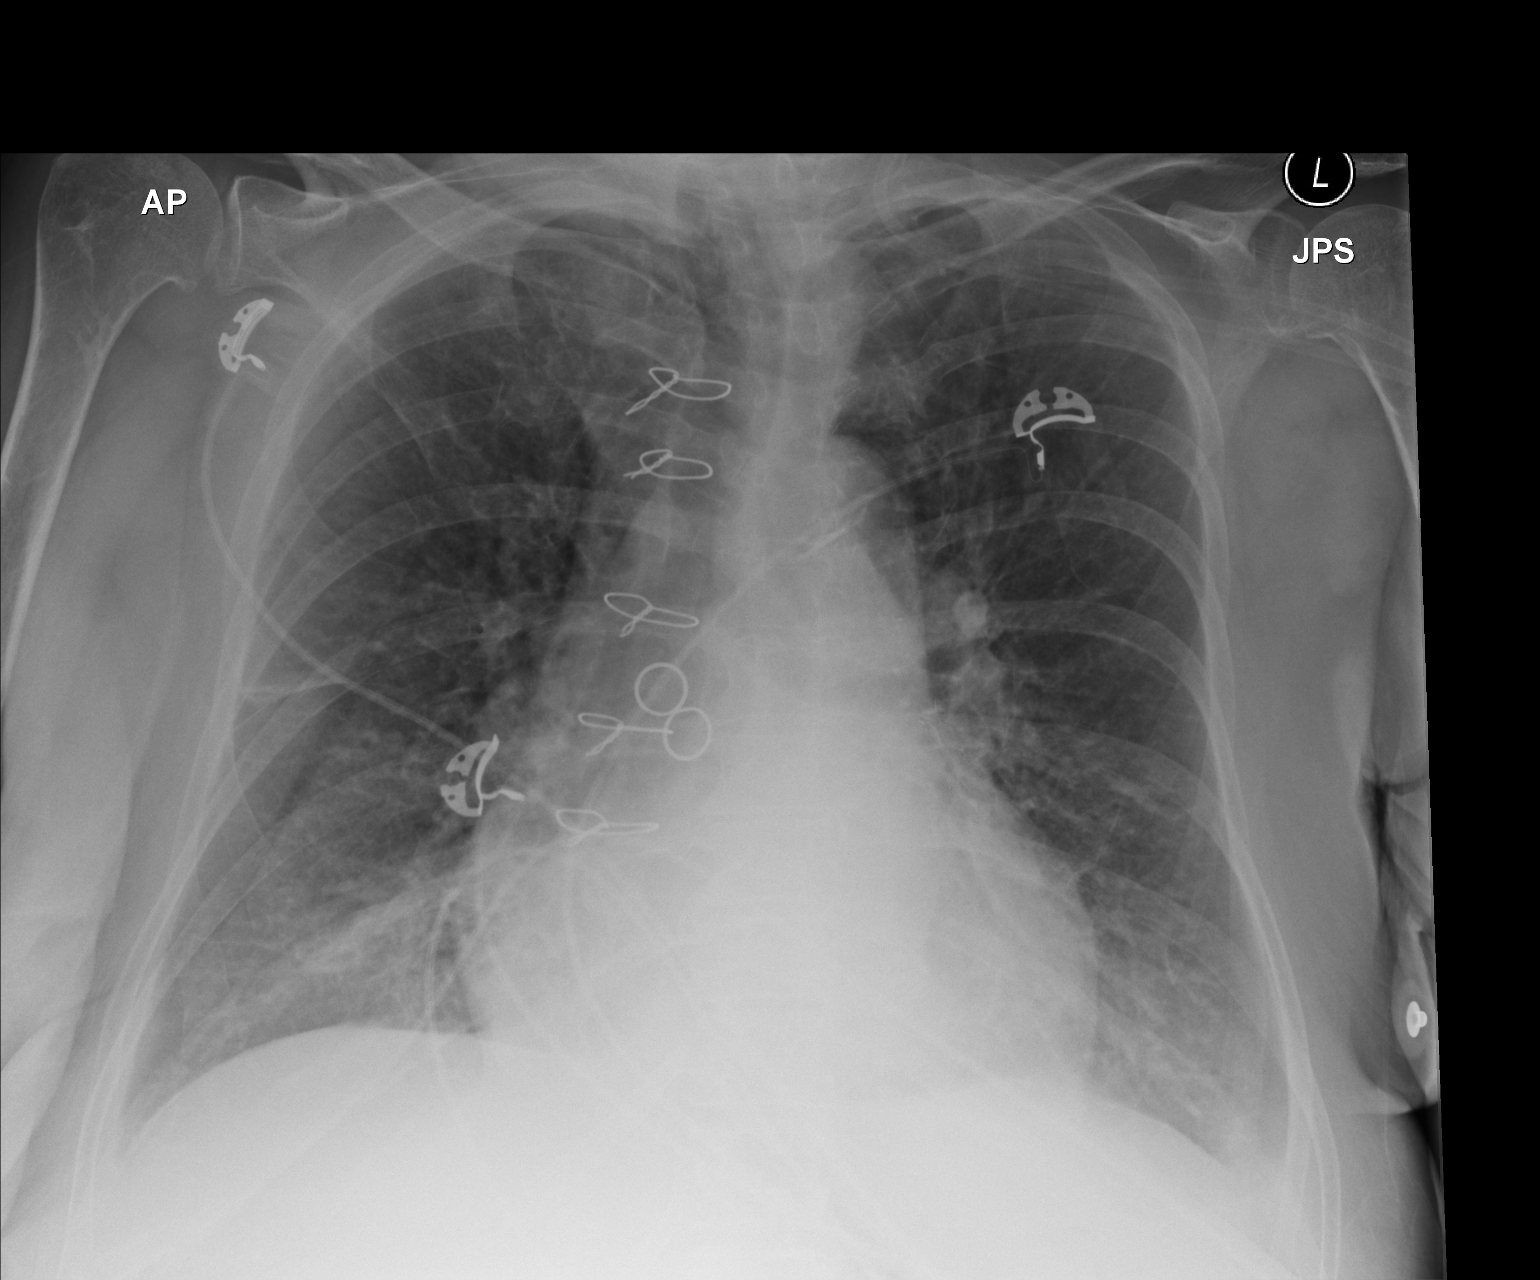

[1 of 1 positions shown; findings below may reference images not displayed]

FINDINGS: Cardiomegaly with normal pulmonary vascularity. Prior CABG. Interim
partial clearing of bilateral pulmonary infiltrates and pleural
effusions consistent consistent with partial clearing of CHF. No
pneumothorax. No acute osseus abnormality.
IMPRESSION: Partial clearing of congestive heart failure with partial clearing
of bilateral pulmonary edema and bilateral pleural effusions. Prior
CABG.

## 2017-02-01 ENCOUNTER — Encounter (HOSPITAL_COMMUNITY): Payer: Self-pay | Admitting: *Deleted

## 2017-02-01 ENCOUNTER — Emergency Department (HOSPITAL_COMMUNITY)
Admission: EM | Admit: 2017-02-01 | Discharge: 2017-02-01 | Disposition: A | Discharge status: Home / Self Care | Payer: Medicare Other | Attending: Emergency Medicine | Admitting: Emergency Medicine

## 2017-02-01 DIAGNOSIS — E039 Hypothyroidism, unspecified: Secondary | ICD-10-CM | POA: Diagnosis not present

## 2017-02-01 DIAGNOSIS — I5032 Chronic diastolic (congestive) heart failure: Secondary | ICD-10-CM | POA: Insufficient documentation

## 2017-02-01 DIAGNOSIS — Z794 Long term (current) use of insulin: Secondary | ICD-10-CM | POA: Diagnosis not present

## 2017-02-01 DIAGNOSIS — I130 Hypertensive heart and chronic kidney disease with heart failure and stage 1 through stage 4 chronic kidney disease, or unspecified chronic kidney disease: Secondary | ICD-10-CM | POA: Diagnosis not present

## 2017-02-01 DIAGNOSIS — D649 Anemia, unspecified: Secondary | ICD-10-CM | POA: Diagnosis present

## 2017-02-01 DIAGNOSIS — I2510 Atherosclerotic heart disease of native coronary artery without angina pectoris: Secondary | ICD-10-CM | POA: Insufficient documentation

## 2017-02-01 DIAGNOSIS — E1122 Type 2 diabetes mellitus with diabetic chronic kidney disease: Secondary | ICD-10-CM | POA: Insufficient documentation

## 2017-02-01 DIAGNOSIS — E1140 Type 2 diabetes mellitus with diabetic neuropathy, unspecified: Secondary | ICD-10-CM | POA: Insufficient documentation

## 2017-02-01 DIAGNOSIS — Z951 Presence of aortocoronary bypass graft: Secondary | ICD-10-CM | POA: Diagnosis not present

## 2017-02-01 DIAGNOSIS — Z8673 Personal history of transient ischemic attack (TIA), and cerebral infarction without residual deficits: Secondary | ICD-10-CM | POA: Insufficient documentation

## 2017-02-01 DIAGNOSIS — R195 Other fecal abnormalities: Secondary | ICD-10-CM | POA: Diagnosis not present

## 2017-02-01 DIAGNOSIS — Z79899 Other long term (current) drug therapy: Secondary | ICD-10-CM | POA: Insufficient documentation

## 2017-02-01 DIAGNOSIS — N184 Chronic kidney disease, stage 4 (severe): Secondary | ICD-10-CM | POA: Insufficient documentation

## 2017-02-01 LAB — BASIC METABOLIC PANEL
Anion gap: 13 (ref 5–15)
BUN: 83 mg/dL — AB (ref 6–20)
CHLORIDE: 100 mmol/L — AB (ref 101–111)
CO2: 23 mmol/L (ref 22–32)
Calcium: 8.3 mg/dL — ABNORMAL LOW (ref 8.9–10.3)
Creatinine, Ser: 4.92 mg/dL — ABNORMAL HIGH (ref 0.44–1.00)
GFR calc Af Amer: 9 mL/min — ABNORMAL LOW (ref >60)
GFR calc non Af Amer: 8 mL/min — ABNORMAL LOW (ref >60)
GLUCOSE: 182 mg/dL — AB (ref 65–99)
POTASSIUM: 3.3 mmol/L — AB (ref 3.5–5.1)
SODIUM: 136 mmol/L (ref 135–145)

## 2017-02-01 LAB — CBG MONITORING, ED
Glucose-Capillary: 168 mg/dL — ABNORMAL HIGH (ref 65–99)
Glucose-Capillary: 191 mg/dL — ABNORMAL HIGH (ref 65–99)

## 2017-02-01 LAB — CBC WITH DIFFERENTIAL/PLATELET
Basophils Absolute: 0 10*3/uL (ref 0.0–0.1)
Basophils Relative: 0 %
Eosinophils Absolute: 0.2 10*3/uL (ref 0.0–0.7)
Eosinophils Relative: 2 %
HEMATOCRIT: 24.1 % — AB (ref 36.0–46.0)
HEMOGLOBIN: 7.3 g/dL — AB (ref 12.0–15.0)
LYMPHS ABS: 0.9 10*3/uL (ref 0.7–4.0)
LYMPHS PCT: 10 %
MCH: 28.4 pg (ref 26.0–34.0)
MCHC: 30.3 g/dL (ref 30.0–36.0)
MCV: 93.8 fL (ref 78.0–100.0)
MONOS PCT: 6 %
Monocytes Absolute: 0.5 10*3/uL (ref 0.1–1.0)
NEUTROS ABS: 7.6 10*3/uL (ref 1.7–7.7)
NEUTROS PCT: 82 %
Platelets: 240 10*3/uL (ref 150–400)
RBC: 2.57 MIL/uL — ABNORMAL LOW (ref 3.87–5.11)
RDW: 14.5 % (ref 11.5–15.5)
WBC: 9.2 10*3/uL (ref 4.0–10.5)

## 2017-02-01 LAB — POC OCCULT BLOOD, ED: Fecal Occult Bld: POSITIVE — AB

## 2017-02-01 LAB — PREPARE RBC (CROSSMATCH)

## 2017-02-01 MED ORDER — SODIUM CHLORIDE 0.9 % IV SOLN: Freq: Once | INTRAVENOUS | Status: DC

## 2017-02-01 NOTE — ED Notes (Signed)
Pt returns to [facility] via [facility].

## 2017-02-01 NOTE — ED Notes (Signed)
Blood bank staff reports blood to be ready for pick up in 10 mins

## 2017-02-01 NOTE — Discharge Instructions (Signed)
Today her Hemoglobin was 7.3. She has no symptoms. She was given a blood transfusion today. She has had microscopic blood in her stool several times. She might want follow up with her GI doctors.

## 2017-02-01 NOTE — ED Notes (Signed)
Leslie walked to blood bank to retrieve unit of blood; blood bank staff states blood will not be ready until 6am this morning; blood bank has had type and screen blood since 3am

## 2017-02-01 NOTE — ED Notes (Signed)
Blood bank staff called and states it will be 20 more mins until blood ready to be picked up

## 2017-02-01 NOTE — ED Provider Notes (Addendum)
Pt sent from her NH for being cold. 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 She does require blood transfusions intermittently.  Patient is elderly, she is blind. She is short and statue, she does not appear to be in distress.  Medical screening examination/treatment/procedure(s) were conducted as a shared visit with non-physician practitioner(s) and myself.  I personally evaluated the patient during the encounter.   EKG Interpretation None       Taylor AlbeIva Taylor Keleher, MD, Concha PyoFACEP    Hermes Wafer, MD 02/01/17 [id number]    Taylor AlbeKnapp, Darianny Momon, MD 02/01/17 [id number]

## 2017-02-01 NOTE — ED Provider Notes (Signed)
[facility]-EMERGENCY DEPT Provider Note   CSN: [id number] Arrival date & time: 02/01/17  0208     History   Chief Complaint Chief Complaint  Patient presents with  . Abnormal Lab    HPI Taylor Padilla is a 71 y.o. female.  Level V caveat: Dementia.   Taylor Padilla is a 71 y.o. Female with a history of dementia, diabetes, blindness, CKD, coronary artery disease and pulmonary artery hypertension who presents to the emergency department from her skilled nursing facility with anemia. Patient tells me she is not sure exactly why she is here and she is feeling well other than feeling very cold. Nursing home reports she has a hemoglobin of 6.6. She's had blood transfusions previously. I discussed this with the patient and she does tell me she would like a blood transfusion if this is necessary. She denies any other complaints. She denies any vomiting or diarrhea. She is unsure if she's had any hematuria or hematochezia as she is unable to see well. She denies feeling lightheaded, dizzy or SOB. She has an active DNR at bedside. She uses oxygen at home.   The history is provided by the patient, the nursing home and medical records. No language interpreter was used.  Abnormal Lab    Past Medical History:  Diagnosis Date  . Blind in both eyes    a. legally blind since 2005 per pt.  Marland Kitchen CAD (coronary artery disease)    a. 01/2002 s/p CABG x 4.  . Chronic diastolic CHF (congestive heart failure) (HCC)    a. 08/2016 Echo: EF 50-55%, no rwma, mild MS, mod MR, sev dil LA, nl RV fxn, mod TR, PASP .  . CKD (chronic kidney disease), stage IV (HCC)   . GERD (gastroesophageal reflux disease)   . Glaucoma, both eyes    a. legally blind.  . High cholesterol   . History of blood transfusion 2003   "related to OHS"  . Hypertensive heart disease   . Hypothyroidism   . Macular edema    hx  . Normocytic anemia    a. 08/2016 s/p 2u PRBCs for H/H 6/20.  Marland Kitchen PAH (pulmonary artery hypertension) (HCC)    a. 08/2016 Echo: PASP .  Marland Kitchen Pneumonia 1990's X 1  . Stroke [facility])    "I've been told I've had one; I don't remember having any reaction from it at all"; denies residual on 10/08/2014  . Type II diabetes mellitus (HCC)   . Valvular heart disease    a. 08/2016 Echo: Mild MS, Mod MR/TR.    Patient Active Problem List   Diagnosis Date Noted  . Viral syndrome   . Pressure injury of skin 08/25/2016  . Dyspnea 08/25/2016  . Hyponatremia 08/24/2016  . Shortness of breath 08/24/2016  . Acute renal failure superimposed on stage 4 chronic kidney disease (HCC) 08/21/2016  . Acute on chronic diastolic congestive heart failure (HCC)   . Hypertensive heart disease   . Valvular heart disease   . PAH (pulmonary artery hypertension) (HCC)   . Normocytic anemia   . Acute encephalopathy 01/18/2016  . Chronic diastolic heart failure (HCC) 12/31/2014  . Staphylococcus aureus bacteremia 11/23/2014  . Fever in adult   . Acute on chronic diastolic heart failure (HCC)   . Coronary artery disease involving native coronary artery of native heart without angina pectoris   . Essential hypertension   . CKD (chronic kidney disease) stage 4, GFR 15-29 ml/min (HCC) 10/07/2014  . Hypothyroidism, acquired 10/07/2014  .  Dyslipidemia 10/07/2014  . GERD (gastroesophageal reflux disease) 10/07/2014  . Sinus bradycardia 10/07/2014  . Type 2 diabetes mellitus with diabetic nephropathy, with long-term current use of insulin [facility])     Past Surgical History:  Procedure Laterality Date  . CATARACT EXTRACTION W/ INTRAOCULAR LENS  IMPLANT, BILATERAL Bilateral   . CORONARY ARTERY BYPASS GRAFT  01/2002   "CABG X4" at [facility] History    No data available       Home Medications    Prior to Admission medications   Medication Sig Start Date End Date Taking? Authorizing Provider  acetaminophen (TYLENOL) 500 MG tablet Take 500 mg by mouth every 4 (four) hours as needed for fever (pain).    Yes [provider]  amLODipine (NORVASC) 10 MG tablet Take 10 mg by mouth daily. 09/27/14  Yes [provider]  atorvastatin (LIPITOR) 80 MG tablet Take 80 mg by mouth at bedtime. 09/21/14  Yes [provider]  Cholecalciferol (VITAMIN D3) 50000 units CAPS Take 50,000 Units by mouth every 30 (thirty) days. Take 1 capsule (50,000 units) by mouth on the 10th of each month   Yes [provider]  clopidogrel (PLAVIX) 75 MG tablet Take 75 mg by mouth daily. 09/24/14  Yes [provider]  Darbepoetin Alfa (ARANESP) 25 MCG/0.42ML SOSY injection Inject 25 mcg into the skin every 7 (seven) days. On Wednesday   Yes [provider]  donepezil (ARICEPT) 5 MG tablet Take 5 mg by mouth at bedtime.   Yes [provider]  dorzolamide-timolol (COSOPT) 22.3-6.8 MG/ML ophthalmic solution Place 1 drop into both eyes every 12 (twelve) hours. 10/06/14  Yes [provider]  doxazosin (CARDURA) 2 MG tablet Take 1 tablet (2 mg total) by mouth daily. 08/24/16  Yes Mikhail, Nita Sells, DO  furosemide (LASIX) 40 MG tablet Take 1 tablet (40 mg total) by mouth 2 (two) times daily. 08/24/16  Yes Mikhail, Nita Sells, DO  hydrALAZINE (APRESOLINE) 100 MG tablet Take 1 tablet (100 mg total) by mouth every 8 (eight) hours. 11/03/14  Yes Vann, Jessica U, DO  insulin aspart (NOVOLOG) 100 UNIT/ML injection Inject 2-12 Units into the skin 4 (four) times daily. Per sliding scale: CBG 151-200 2 units, 201-250 4 units, 251-300 6 units, 301-350 8 units, 351-400 10 units, 401-450 12 units, Call MD for CBG >451 or <60   Yes [provider]  isosorbide dinitrate (ISORDIL) 20 MG tablet Take 20 mg by mouth 3 (three) times daily. 9am, 1pm, 5pm   Yes [provider]  latanoprost (XALATAN) 0.005 % ophthalmic solution Place 1 drop into the right eye at bedtime.   Yes [provider]  levothyroxine (SYNTHROID, LEVOTHROID) 100 MCG tablet Take 1 tablet (100 mcg total) by mouth daily  before breakfast. 11/26/14  Yes Short, Thea Silversmith, MD  loperamide (IMODIUM A-D) 2 MG tablet Take 2 mg by mouth every 3 (three) hours as needed for diarrhea or loose stools. Maximum 8 tablets in 24 hours   Yes [provider]  Magnesium Hydroxide (MILK OF MAGNESIA PO) Take 30 mLs by mouth at bedtime as needed (constipation).   Yes [provider]  Melatonin 3 MG TABS Take 3 mg by mouth at bedtime.   Yes [provider]  mirtazapine (REMERON) 15 MG tablet Take 15 mg by mouth at bedtime. For appetite   Yes [provider]  ondansetron (ZOFRAN) 4 MG tablet Take 4 mg by mouth every 6 (six) hours as needed for nausea  or vomiting.   Yes [provider]  pantoprazole (PROTONIX) 20 MG tablet Take 20 mg by mouth daily at 6 (six) AM.    Yes [provider]  polyvinyl alcohol (LIQUIFILM TEARS) 1.4 % ophthalmic solution Place 1 drop into the left eye 4 (four) times daily.   Yes [provider]  prednisoLONE acetate (PRED FORTE) 1 % ophthalmic suspension Place 1 drop into the left eye 4 (four) times daily. 09/27/14  Yes [provider]  sertraline (ZOLOFT) 50 MG tablet Take 50 mg by mouth daily.   Yes [provider]  OXYGEN Inhale 2 L into the lungs See admin instructions. "Apply 2 liters per minute via nasal canula for shortness of breath"    [provider]    Family History Family History  Problem Relation Age of Onset  . CAD Neg Hx     Social History Social History  Substance Use Topics  . Smoking status: Never Smoker  . Smokeless tobacco: Never Used  . Alcohol use No     Allergies   Epinephrine   Review of Systems Review of Systems  Unable to perform ROS: Dementia  Constitutional: Negative for fever.  Respiratory: Negative for shortness of breath.   Cardiovascular: Negative for chest pain.  Gastrointestinal: Negative for abdominal pain, diarrhea and vomiting.  Neurological: Negative for  light-headedness.     Physical Exam Updated Vital Signs BP (!) 144/53   Pulse (!) 59   Temp 97.9 F (36.6 C) (Oral)   Resp 20   SpO2 95%   Physical Exam  Constitutional: She appears well-developed and well-nourished. No distress.  Nontoxic appearing.  HENT:  Head: Normocephalic and atraumatic.  Mouth/Throat: Oropharynx is clear and moist.  Eyes: Right eye exhibits no discharge. Left eye exhibits no discharge.  Neck: Neck supple.  Cardiovascular: Normal rate, regular rhythm, normal heart sounds and intact distal pulses.   Pulmonary/Chest: Effort normal and breath sounds normal. No respiratory distress. She has no wheezes. She has no rales.  Abdominal: Soft. There is no tenderness. There is no guarding.  Genitourinary:  Genitourinary Comments: Brown stool on exam. No melena.   Musculoskeletal: She exhibits no edema.  Lymphadenopathy:    She has no cervical adenopathy.  Neurological: She is alert. Coordination normal.  Patient is alert and oriented to person and place. She has some repetitive questions and has difficulty with recent memory. She is very pleasant and cooperative with exam.  Skin: Skin is warm and dry. Capillary refill takes less than 2 seconds. No rash noted. She is not diaphoretic. No erythema. There is pallor.  Slightly pale.  Psychiatric: She has a normal mood and affect. Her behavior is normal.  Nursing note and vitals reviewed.    ED Treatments / Results  Labs (all labs ordered are listed, but only abnormal results are displayed) Labs Reviewed  CBC WITH DIFFERENTIAL/PLATELET - Abnormal; Notable for the following:       Result Value   RBC 2.57 (*)    Hemoglobin 7.3 (*)    HCT 24.1 (*)    All other components within normal limits  BASIC METABOLIC PANEL - Abnormal; Notable for the following:    Potassium 3.3 (*)    Chloride 100 (*)    Glucose, Bld 182 (*)    BUN 83 (*)    Creatinine, Ser 4.92 (*)    Calcium 8.3 (*)    GFR calc non Af Amer 8 (*)     GFR calc Af Denyse Dago  9 (*)    All other components within normal limits  POC OCCULT BLOOD, ED - Abnormal; Notable for the following:    Fecal Occult Bld POSITIVE (*)    All other components within normal limits  CBG MONITORING, ED - Abnormal; Notable for the following:    Glucose-Capillary 168 (*)    All other components within normal limits  TYPE AND SCREEN  PREPARE RBC (CROSSMATCH)    EKG  EKG Interpretation None       Radiology No results found.  Procedures Procedures (including critical care time)  Medications Ordered in ED Medications  0.9 %  sodium chloride infusion (not administered)     Initial Impression / Assessment and Plan / ED Course  I have reviewed the triage vital signs and the nursing notes.  Pertinent labs & imaging results that were available during my care of the patient were reviewed by me and considered in my medical decision making (see chart for details).    This is a 71 y.o. Female with a history of dementia, diabetes, blindness, CKD, coronary artery disease and pulmonary artery hypertension who presents to the emergency department from her skilled nursing facility with anemia. Patient tells me she is not sure exactly why she is here and she is feeling well other than feeling very cold. Nursing home reports she has a hemoglobin of 6.6. She's had blood transfusions previously. I discussed this with the patient and she does tell me she would like a blood transfusion if this is necessary. She denies any other complaints. She denies any vomiting or diarrhea. She is unsure if she's had any hematuria or hematochezia as she is unable to see well. She denies feeling lightheaded, dizzy or SOB. She has an active DNR.  On exam patient is afebrile nontoxic appearing. Lungs clear to auscultation bilaterally. She has brown stool on exam. No melena or red stool. CBC reveals a hemoglobin of 7.3. This is a decrease from her baseline of around 8.5. She has cardiovascular  disease she has an indication for blood transfusion. Patient does request blood transfusion. Will provide blood transfusion today. BMP reveals a creatinine around her baseline.  Patient does have fecal occult blood. She's had positive occult blood testing previously. She has no melena or red stool. No vomiting or hematemesis. After discussion with my attending, Dr. Lynelle DoctorKnapp, we feel the patient can safely follow up as an outpatient. She can be discharged back to nursing facility after her blood transfusion. Patient agrees with plan.  Patient discharged in good condition after blood transfusion. I did encourage nursing facility to have her follow-up with GI and her primary care doctor.  This patient was discussed with Dr. Lynelle DoctorKnapp who agrees with assessment and plan.   Final Clinical Impressions(s) / ED Diagnoses   Final diagnoses:  Symptomatic anemia  Positive fecal occult blood test    New Prescriptions New Prescriptions   No medications on file     Everlene FarrierDansie, Zarek Relph, Cordelia Poche-C 02/01/17 [id number]    Devoria AlbeKnapp, Iva, MD 02/01/17 [phone]

## 2017-02-01 NOTE — ED Notes (Signed)
[facility] called to give report. On hold for over 10 minutes with multiple returns to operator but no nurse would  pick up to take report. Number given to operator for them to call for report.

## 2017-02-01 NOTE — ED Notes (Addendum)
Pt's blood finished transfusing. Discharge orders=-= will call [facility]

## 2017-02-01 NOTE — ED Triage Notes (Signed)
[facility] for hemoglobin of 6.6 and lethargy. Pt A&Ox3, hx of blood transfusions. Pt denies complaints other than being cold.

## 2017-02-02 LAB — BPAM RBC
BLOOD PRODUCT EXPIRATION DATE: 201806172359
Blood Product Expiration Date: 201806182359
ISSUE DATE / TIME: 201805310559
UNIT TYPE AND RH: 5100
Unit Type and Rh: 5100

## 2017-02-02 LAB — TYPE AND SCREEN
ABO/RH(D): O POS
Antibody Screen: NEGATIVE
DONOR AG TYPE: NEGATIVE
Donor AG Type: NEGATIVE
UNIT DIVISION: 0
Unit division: 0

## 2017-02-13 ENCOUNTER — Inpatient Hospital Stay (HOSPITAL_COMMUNITY)
Admission: EM | Admit: 2017-02-13 | Discharge: 2017-02-18 | DRG: 291 | Disposition: A | Discharge status: Skilled Nursing Facility | Payer: Medicare Other | Attending: Family Medicine | Admitting: Family Medicine

## 2017-02-13 ENCOUNTER — Encounter (HOSPITAL_COMMUNITY): Payer: Self-pay | Admitting: Emergency Medicine

## 2017-02-13 ENCOUNTER — Emergency Department (HOSPITAL_COMMUNITY): Payer: Medicare Other

## 2017-02-13 DIAGNOSIS — E785 Hyperlipidemia, unspecified: Secondary | ICD-10-CM | POA: Diagnosis present

## 2017-02-13 DIAGNOSIS — Z951 Presence of aortocoronary bypass graft: Secondary | ICD-10-CM

## 2017-02-13 DIAGNOSIS — I10 Essential (primary) hypertension: Secondary | ICD-10-CM | POA: Diagnosis not present

## 2017-02-13 DIAGNOSIS — Z9981 Dependence on supplemental oxygen: Secondary | ICD-10-CM

## 2017-02-13 DIAGNOSIS — E11311 Type 2 diabetes mellitus with unspecified diabetic retinopathy with macular edema: Secondary | ICD-10-CM | POA: Diagnosis present

## 2017-02-13 DIAGNOSIS — I252 Old myocardial infarction: Secondary | ICD-10-CM

## 2017-02-13 DIAGNOSIS — Z7902 Long term (current) use of antithrombotics/antiplatelets: Secondary | ICD-10-CM | POA: Diagnosis not present

## 2017-02-13 DIAGNOSIS — R778 Other specified abnormalities of plasma proteins: Secondary | ICD-10-CM | POA: Diagnosis present

## 2017-02-13 DIAGNOSIS — Z8701 Personal history of pneumonia (recurrent): Secondary | ICD-10-CM

## 2017-02-13 DIAGNOSIS — E039 Hypothyroidism, unspecified: Secondary | ICD-10-CM | POA: Diagnosis present

## 2017-02-13 DIAGNOSIS — I2510 Atherosclerotic heart disease of native coronary artery without angina pectoris: Secondary | ICD-10-CM

## 2017-02-13 DIAGNOSIS — R0602 Shortness of breath: Secondary | ICD-10-CM | POA: Diagnosis present

## 2017-02-13 DIAGNOSIS — E1121 Type 2 diabetes mellitus with diabetic nephropathy: Secondary | ICD-10-CM | POA: Diagnosis present

## 2017-02-13 DIAGNOSIS — N185 Chronic kidney disease, stage 5: Secondary | ICD-10-CM | POA: Diagnosis present

## 2017-02-13 DIAGNOSIS — E1122 Type 2 diabetes mellitus with diabetic chronic kidney disease: Secondary | ICD-10-CM | POA: Diagnosis present

## 2017-02-13 DIAGNOSIS — H548 Legal blindness, as defined in USA: Secondary | ICD-10-CM | POA: Diagnosis present

## 2017-02-13 DIAGNOSIS — Z794 Long term (current) use of insulin: Secondary | ICD-10-CM

## 2017-02-13 DIAGNOSIS — Z79899 Other long term (current) drug therapy: Secondary | ICD-10-CM | POA: Diagnosis not present

## 2017-02-13 DIAGNOSIS — I2721 Secondary pulmonary arterial hypertension: Secondary | ICD-10-CM | POA: Diagnosis present

## 2017-02-13 DIAGNOSIS — K219 Gastro-esophageal reflux disease without esophagitis: Secondary | ICD-10-CM | POA: Diagnosis present

## 2017-02-13 DIAGNOSIS — I509 Heart failure, unspecified: Secondary | ICD-10-CM

## 2017-02-13 DIAGNOSIS — I360 Nonrheumatic tricuspid (valve) stenosis: Secondary | ICD-10-CM | POA: Diagnosis not present

## 2017-02-13 DIAGNOSIS — H409 Unspecified glaucoma: Secondary | ICD-10-CM | POA: Diagnosis present

## 2017-02-13 DIAGNOSIS — I5043 Acute on chronic combined systolic (congestive) and diastolic (congestive) heart failure: Secondary | ICD-10-CM | POA: Diagnosis present

## 2017-02-13 DIAGNOSIS — I248 Other forms of acute ischemic heart disease: Secondary | ICD-10-CM | POA: Diagnosis present

## 2017-02-13 DIAGNOSIS — J9621 Acute and chronic respiratory failure with hypoxia: Secondary | ICD-10-CM | POA: Diagnosis present

## 2017-02-13 DIAGNOSIS — I5033 Acute on chronic diastolic (congestive) heart failure: Secondary | ICD-10-CM | POA: Diagnosis present

## 2017-02-13 DIAGNOSIS — E7800 Pure hypercholesterolemia, unspecified: Secondary | ICD-10-CM | POA: Diagnosis present

## 2017-02-13 DIAGNOSIS — D631 Anemia in chronic kidney disease: Secondary | ICD-10-CM | POA: Diagnosis present

## 2017-02-13 DIAGNOSIS — R748 Abnormal levels of other serum enzymes: Secondary | ICD-10-CM | POA: Diagnosis not present

## 2017-02-13 DIAGNOSIS — I132 Hypertensive heart and chronic kidney disease with heart failure and with stage 5 chronic kidney disease, or end stage renal disease: Secondary | ICD-10-CM | POA: Diagnosis present

## 2017-02-13 DIAGNOSIS — Z7982 Long term (current) use of aspirin: Secondary | ICD-10-CM

## 2017-02-13 DIAGNOSIS — R7989 Other specified abnormal findings of blood chemistry: Secondary | ICD-10-CM

## 2017-02-13 DIAGNOSIS — D649 Anemia, unspecified: Secondary | ICD-10-CM | POA: Diagnosis not present

## 2017-02-13 LAB — CBC WITH DIFFERENTIAL/PLATELET
BASOS ABS: 0 10*3/uL (ref 0.0–0.1)
BASOS PCT: 0 %
EOS PCT: 0 %
Eosinophils Absolute: 0 10*3/uL (ref 0.0–0.7)
HCT: 26.2 % — ABNORMAL LOW (ref 36.0–46.0)
HEMOGLOBIN: 8.3 g/dL — AB (ref 12.0–15.0)
Lymphocytes Relative: 4 %
Lymphs Abs: 0.3 10*3/uL — ABNORMAL LOW (ref 0.7–4.0)
MCH: 29.4 pg (ref 26.0–34.0)
MCHC: 31.7 g/dL (ref 30.0–36.0)
MCV: 92.9 fL (ref 78.0–100.0)
Monocytes Absolute: 0.3 10*3/uL (ref 0.1–1.0)
Monocytes Relative: 3 %
NEUTROS PCT: 93 %
Neutro Abs: 8.4 10*3/uL — ABNORMAL HIGH (ref 1.7–7.7)
PLATELETS: 270 10*3/uL (ref 150–400)
RBC: 2.82 MIL/uL — AB (ref 3.87–5.11)
RDW: 14.7 % (ref 11.5–15.5)
WBC: 9 10*3/uL (ref 4.0–10.5)

## 2017-02-13 LAB — GLUCOSE, CAPILLARY: Glucose-Capillary: 153 mg/dL — ABNORMAL HIGH (ref 65–99)

## 2017-02-13 LAB — COMPREHENSIVE METABOLIC PANEL
ALBUMIN: 3.5 g/dL (ref 3.5–5.0)
ALK PHOS: 71 U/L (ref 38–126)
ALT: 11 U/L — ABNORMAL LOW (ref 14–54)
ANION GAP: 9 (ref 5–15)
AST: 17 U/L (ref 15–41)
BILIRUBIN TOTAL: 0.7 mg/dL (ref 0.3–1.2)
BUN: 72 mg/dL — ABNORMAL HIGH (ref 6–20)
CALCIUM: 8.6 mg/dL — AB (ref 8.9–10.3)
CO2: 24 mmol/L (ref 22–32)
Chloride: 106 mmol/L (ref 101–111)
Creatinine, Ser: 4.67 mg/dL — ABNORMAL HIGH (ref 0.44–1.00)
GFR calc non Af Amer: 9 mL/min — ABNORMAL LOW (ref >60)
GFR, EST AFRICAN AMERICAN: 10 mL/min — AB (ref >60)
Glucose, Bld: 193 mg/dL — ABNORMAL HIGH (ref 65–99)
Potassium: 4.1 mmol/L (ref 3.5–5.1)
SODIUM: 139 mmol/L (ref 135–145)
TOTAL PROTEIN: 7.1 g/dL (ref 6.5–8.1)

## 2017-02-13 LAB — TROPONIN I
TROPONIN I: 0.14 ng/mL — AB (ref <0.03)
TROPONIN I: 0.2 ng/mL — AB (ref <0.03)
TROPONIN I: 0.22 ng/mL — AB (ref <0.03)

## 2017-02-13 LAB — MRSA PCR SCREENING: MRSA BY PCR: POSITIVE — AB

## 2017-02-13 LAB — BRAIN NATRIURETIC PEPTIDE: B Natriuretic Peptide: 3008.9 pg/mL — ABNORMAL HIGH (ref 0.0–100.0)

## 2017-02-13 LAB — TSH: TSH: 1.984 u[IU]/mL (ref 0.350–4.500)

## 2017-02-13 MED ORDER — SERTRALINE HCL 50 MG PO TABS
50.0000 mg | ORAL_TABLET | Freq: Every day | ORAL | Status: DC
  Administered 2017-02-14 – 2017-02-18 (×5): 50 mg via ORAL
  Filled 2017-02-13 (×5): qty 1

## 2017-02-13 MED ORDER — ISOSORBIDE DINITRATE 20 MG PO TABS
20.0000 mg | ORAL_TABLET | Freq: Three times a day (TID) | ORAL | Status: DC
  Administered 2017-02-13 – 2017-02-18 (×13): 20 mg via ORAL
  Filled 2017-02-13 (×16): qty 1

## 2017-02-13 MED ORDER — DORZOLAMIDE HCL-TIMOLOL MAL 2-0.5 % OP SOLN
1.0000 [drp] | Freq: Two times a day (BID) | OPHTHALMIC | Status: DC
  Administered 2017-02-13 – 2017-02-18 (×10): 1 [drp] via OPHTHALMIC
  Filled 2017-02-13: qty 10

## 2017-02-13 MED ORDER — ACETAMINOPHEN 500 MG PO TABS: 500.0000 mg | ORAL_TABLET | ORAL | Status: DC | PRN

## 2017-02-13 MED ORDER — DOXAZOSIN MESYLATE 2 MG PO TABS
2.0000 mg | ORAL_TABLET | Freq: Every day | ORAL | Status: DC
  Administered 2017-02-14: 2 mg via ORAL
  Filled 2017-02-13: qty 1

## 2017-02-13 MED ORDER — INSULIN ASPART 100 UNIT/ML ~~LOC~~ SOLN
2.0000 [IU] | Freq: Four times a day (QID) | SUBCUTANEOUS | Status: DC
  Administered 2017-02-15 – 2017-02-18 (×8): 2 [IU] via SUBCUTANEOUS

## 2017-02-13 MED ORDER — PREDNISOLONE ACETATE 1 % OP SUSP
1.0000 [drp] | Freq: Four times a day (QID) | OPHTHALMIC | Status: DC
  Administered 2017-02-13 – 2017-02-18 (×15): 1 [drp] via OPHTHALMIC
  Filled 2017-02-13: qty 5

## 2017-02-13 MED ORDER — POLYVINYL ALCOHOL 1.4 % OP SOLN
1.0000 [drp] | Freq: Four times a day (QID) | OPHTHALMIC | Status: DC
  Administered 2017-02-13 – 2017-02-18 (×14): 1 [drp] via OPHTHALMIC
  Filled 2017-02-13: qty 15

## 2017-02-13 MED ORDER — ONDANSETRON HCL 4 MG PO TABS: 4.0000 mg | ORAL_TABLET | Freq: Four times a day (QID) | ORAL | Status: DC | PRN

## 2017-02-13 MED ORDER — CLOPIDOGREL BISULFATE 75 MG PO TABS
75.0000 mg | ORAL_TABLET | Freq: Every day | ORAL | Status: DC
  Administered 2017-02-14 – 2017-02-18 (×5): 75 mg via ORAL
  Filled 2017-02-13 (×5): qty 1

## 2017-02-13 MED ORDER — SODIUM CHLORIDE 0.9 % IV SOLN
INTRAVENOUS | Status: DC
  Administered 2017-02-13: 18:00:00 via INTRAVENOUS

## 2017-02-13 MED ORDER — LEVOTHYROXINE SODIUM 100 MCG PO TABS
100.0000 ug | ORAL_TABLET | Freq: Every day | ORAL | Status: DC
  Administered 2017-02-14 – 2017-02-18 (×5): 100 ug via ORAL
  Filled 2017-02-13 (×5): qty 1

## 2017-02-13 MED ORDER — PANTOPRAZOLE SODIUM 20 MG PO TBEC
20.0000 mg | DELAYED_RELEASE_TABLET | Freq: Every day | ORAL | Status: DC
  Administered 2017-02-14 – 2017-02-18 (×5): 20 mg via ORAL
  Filled 2017-02-13 (×5): qty 1

## 2017-02-13 MED ORDER — SODIUM CHLORIDE 0.9% FLUSH
3.0000 mL | Freq: Two times a day (BID) | INTRAVENOUS | Status: DC
  Administered 2017-02-14 – 2017-02-16 (×3): 3 mL via INTRAVENOUS

## 2017-02-13 MED ORDER — VITAMIN D3 1.25 MG (50000 UT) PO CAPS: 50000.0000 [IU] | ORAL_CAPSULE | ORAL | Status: DC

## 2017-02-13 MED ORDER — LATANOPROST 0.005 % OP SOLN
1.0000 [drp] | Freq: Every day | OPHTHALMIC | Status: DC
  Administered 2017-02-13 – 2017-02-17 (×5): 1 [drp] via OPHTHALMIC
  Filled 2017-02-13: qty 2.5

## 2017-02-13 MED ORDER — HYDRALAZINE HCL 50 MG PO TABS
100.0000 mg | ORAL_TABLET | Freq: Three times a day (TID) | ORAL | Status: DC
  Administered 2017-02-13 – 2017-02-18 (×15): 100 mg via ORAL
  Filled 2017-02-13 (×15): qty 2

## 2017-02-13 MED ORDER — CHLORHEXIDINE GLUCONATE 0.12 % MT SOLN
15.0000 mL | Freq: Two times a day (BID) | OROMUCOSAL | Status: DC
  Administered 2017-02-13 – 2017-02-18 (×10): 15 mL via OROMUCOSAL
  Filled 2017-02-13 (×8): qty 15

## 2017-02-13 MED ORDER — IPRATROPIUM-ALBUTEROL 0.5-2.5 (3) MG/3ML IN SOLN: 3.0000 mL | RESPIRATORY_TRACT | Status: DC | PRN

## 2017-02-13 MED ORDER — LOPERAMIDE HCL 2 MG PO CAPS: 2.0000 mg | ORAL_CAPSULE | ORAL | Status: DC | PRN

## 2017-02-13 MED ORDER — BACID PO TABS
1.0000 | ORAL_TABLET | Freq: Every day | ORAL | Status: DC
  Filled 2017-02-13: qty 1

## 2017-02-13 MED ORDER — DONEPEZIL HCL 5 MG PO TABS
5.0000 mg | ORAL_TABLET | Freq: Every day | ORAL | Status: DC
  Administered 2017-02-13 – 2017-02-17 (×5): 5 mg via ORAL
  Filled 2017-02-13 (×5): qty 1

## 2017-02-13 MED ORDER — AMLODIPINE BESYLATE 10 MG PO TABS
10.0000 mg | ORAL_TABLET | Freq: Every day | ORAL | Status: DC
  Administered 2017-02-14 – 2017-02-18 (×5): 10 mg via ORAL
  Filled 2017-02-13 (×5): qty 1

## 2017-02-13 MED ORDER — ATORVASTATIN CALCIUM 40 MG PO TABS
80.0000 mg | ORAL_TABLET | Freq: Every day | ORAL | Status: DC
Start: 2017-02-13 — End: 2017-02-18
  Administered 2017-02-13 – 2017-02-17 (×5): 80 mg via ORAL
  Filled 2017-02-13 (×5): qty 2

## 2017-02-13 MED ORDER — MELATONIN 3 MG PO TABS: 3.0000 mg | ORAL_TABLET | Freq: Every day | ORAL | Status: DC

## 2017-02-13 MED ORDER — MIRTAZAPINE 15 MG PO TABS
15.0000 mg | ORAL_TABLET | Freq: Every day | ORAL | Status: DC
  Administered 2017-02-13 – 2017-02-17 (×5): 15 mg via ORAL
  Filled 2017-02-13 (×5): qty 1

## 2017-02-13 NOTE — ED Notes (Signed)
RN also made aware of critical troponin result

## 2017-02-13 NOTE — ED Provider Notes (Signed)
[facility]-EMERGENCY DEPT Provider Note   CSN: [id number] Arrival date & time: 02/13/17  1304     History   Chief Complaint Chief Complaint  Patient presents with  . low oxygen saturation    HPI Taylor Padilla is a 71 y.o. female.  HPI Patient presents with hypoxia. Reportedly had sats in the 70s at the nursing home. Per EMS not on oxygen but reviewing MAR patient has when necessary oxygen. As of 6:30 this morning had sats of 95%. Upon arrival sats of 66% on room air. Has had an occasional cough without production. No fevers. No chest pain. States that she thinks she had pneumonia last week but does not know she had antibiotics for it. Patient is legally blind. States she did vomit and had some nausea. Past Medical History:  Diagnosis Date  . Blind in both eyes    a. legally blind since 2005 per pt.  Marland Kitchen CAD (coronary artery disease)    a. 01/2002 s/p CABG x 4.  . Chronic diastolic CHF (congestive heart failure) (HCC)    a. 08/2016 Echo: EF 50-55%, no rwma, mild MS, mod MR, sev dil LA, nl RV fxn, mod TR, PASP .  . CKD (chronic kidney disease), stage IV (HCC)   . GERD (gastroesophageal reflux disease)   . Glaucoma, both eyes    a. legally blind.  . High cholesterol   . History of blood transfusion 2003   "related to OHS"  . Hypertensive heart disease   . Hypothyroidism   . Macular edema    hx  . Normocytic anemia    a. 08/2016 s/p 2u PRBCs for H/H 6/20.  Marland Kitchen PAH (pulmonary artery hypertension) (HCC)    a. 08/2016 Echo: PASP .  Marland Kitchen Pneumonia 1990's X 1  . Stroke [facility])    "I've been told I've had one; I don't remember having any reaction from it at all"; denies residual on 10/08/2014  . Type II diabetes mellitus (HCC)   . Valvular heart disease    a. 08/2016 Echo: Mild MS, Mod MR/TR.    Patient Active Problem List   Diagnosis Date Noted  . Viral syndrome   . Pressure injury of skin 08/25/2016  . Dyspnea 08/25/2016  . Hyponatremia 08/24/2016  . Shortness of breath  08/24/2016  . Acute renal failure superimposed on stage 4 chronic kidney disease (HCC) 08/21/2016  . Acute on chronic diastolic congestive heart failure (HCC)   . Hypertensive heart disease   . Valvular heart disease   . PAH (pulmonary artery hypertension) (HCC)   . Normocytic anemia   . Acute encephalopathy 01/18/2016  . Chronic diastolic heart failure (HCC) 12/31/2014  . Staphylococcus aureus bacteremia 11/23/2014  . Fever in adult   . Acute on chronic diastolic heart failure (HCC)   . Coronary artery disease involving native coronary artery of native heart without angina pectoris   . Essential hypertension   . CKD (chronic kidney disease) stage 4, GFR 15-29 ml/min (HCC) 10/07/2014  . Hypothyroidism, acquired 10/07/2014  . Dyslipidemia 10/07/2014  . GERD (gastroesophageal reflux disease) 10/07/2014  . Sinus bradycardia 10/07/2014  . Type 2 diabetes mellitus with diabetic nephropathy, with long-term current use of insulin [facility])     Past Surgical History:  Procedure Laterality Date  . CATARACT EXTRACTION W/ INTRAOCULAR LENS  IMPLANT, BILATERAL Bilateral   . CORONARY ARTERY BYPASS GRAFT  01/2002   "CABG X4" at [facility] History    No data available  Home Medications    Prior to Admission medications   Medication Sig Start Date End Date Taking? Authorizing Provider  acetaminophen (TYLENOL) 500 MG tablet Take 500 mg by mouth every 4 (four) hours as needed for fever (pain).    Yes [provider]  amLODipine (NORVASC) 10 MG tablet Take 10 mg by mouth daily. 09/27/14  Yes [provider]  atorvastatin (LIPITOR) 80 MG tablet Take 80 mg by mouth at bedtime. 09/21/14  Yes [provider]  chlorhexidine (PERIDEX) 0.12 % solution Use as directed 15 mLs in the mouth or throat 2 (two) times daily.   Yes [provider]  Cholecalciferol (VITAMIN D3) 50000 units CAPS Take 50,000 Units by mouth every 30 (thirty) days. Take 1 capsule (50,000  units) by mouth on the 10th of each month   Yes [provider]  clopidogrel (PLAVIX) 75 MG tablet Take 75 mg by mouth daily. 09/24/14  Yes [provider]  Darbepoetin Alfa (ARANESP) 25 MCG/0.42ML SOSY injection Inject 25 mcg into the skin every 7 (seven) days. On Wednesday   Yes [provider]  donepezil (ARICEPT) 5 MG tablet Take 5 mg by mouth at bedtime.   Yes [provider]  dorzolamide-timolol (COSOPT) 22.3-6.8 MG/ML ophthalmic solution Place 1 drop into both eyes every 12 (twelve) hours. 10/06/14  Yes [provider]  doxazosin (CARDURA) 2 MG tablet Take 1 tablet (2 mg total) by mouth daily. 08/24/16  Yes Mikhail, Nita SellsMaryann, DO  furosemide (LASIX) 40 MG tablet Take 1 tablet (40 mg total) by mouth 2 (two) times daily. 08/24/16  Yes Mikhail, Nita SellsMaryann, DO  hydrALAZINE (APRESOLINE) 100 MG tablet Take 1 tablet (100 mg total) by mouth every 8 (eight) hours. 11/03/14  Yes Vann, Jessica U, DO  insulin aspart (NOVOLOG) 100 UNIT/ML injection Inject 2-12 Units into the skin 4 (four) times daily. Per sliding scale: CBG 151-200 2 units, 201-250 4 units, 251-300 6 units, 301-350 8 units, 351-400 10 units, 401-450 12 units, Call MD for CBG >451 or <60   Yes [provider]  isosorbide dinitrate (ISORDIL) 20 MG tablet Take 20 mg by mouth 3 (three) times daily. 9am, 1pm, 5pm   Yes [provider]  lactobacillus acidophilus (BACID) TABS tablet Take 1 tablet by mouth daily.   Yes [provider]  latanoprost (XALATAN) 0.005 % ophthalmic solution Place 1 drop into the right eye at bedtime.   Yes [provider]  levothyroxine (SYNTHROID, LEVOTHROID) 100 MCG tablet Take 1 tablet (100 mcg total) by mouth daily before breakfast. 11/26/14  Yes Short, Thea SilversmithMackenzie, MD  loperamide (IMODIUM A-D) 2 MG tablet Take 2 mg by mouth every 3 (three) hours as needed for diarrhea or loose stools. Maximum 8 tablets in 24 hours   Yes [provider]    Magnesium Hydroxide (MILK OF MAGNESIA PO) Take 30 mLs by mouth at bedtime as needed (constipation).   Yes [provider]  Melatonin 3 MG TABS Take 3 mg by mouth at bedtime.   Yes [provider]  mirtazapine (REMERON) 15 MG tablet Take 15 mg by mouth at bedtime. For appetite   Yes [provider]  ondansetron (ZOFRAN) 4 MG tablet Take 4 mg by mouth every 6 (six) hours as needed for nausea or vomiting.   Yes [provider]  OXYGEN Inhale 2 L into the lungs See admin instructions. "Apply 2 liters per minute via nasal canula for shortness of breath"   Yes [provider]  pantoprazole (  PROTONIX) 20 MG tablet Take 20 mg by mouth daily at 6 (six) AM.    Yes [provider]  polyvinyl alcohol (LIQUIFILM TEARS) 1.4 % ophthalmic solution Place 1 drop into the left eye 4 (four) times daily.   Yes [provider]  prednisoLONE acetate (PRED FORTE) 1 % ophthalmic suspension Place 1 drop into the left eye 4 (four) times daily. 09/27/14  Yes [provider]  sertraline (ZOLOFT) 50 MG tablet Take 50 mg by mouth daily.   Yes [provider]    Family History Family History  Problem Relation Age of Onset  . CAD Neg Hx     Social History Social History  Substance Use Topics  . Smoking status: Never Smoker  . Smokeless tobacco: Never Used  . Alcohol use No     Allergies   Epinephrine   Review of Systems Review of Systems  Constitutional: Negative for appetite change and fever.  HENT: Negative for congestion.   Respiratory: Positive for cough and shortness of breath.   Gastrointestinal: Positive for nausea and vomiting. Negative for abdominal pain.  Genitourinary: Negative for dysuria.  Musculoskeletal: Negative for back pain.  Hematological: Negative for adenopathy.  Psychiatric/Behavioral: Negative for confusion.     Physical Exam Updated Vital Signs BP (!) 169/81   Pulse 70   Temp 99.7 F (37.6 C)  (Rectal)   Resp (!) 22   SpO2 95%   Physical Exam  Constitutional: She appears well-developed.  HENT:  Head: Normocephalic.  Eyes:  Patient is blind  Neck: No JVD present.  Cardiovascular: Normal rate.   Pulmonary/Chest: She has rales.  Rales bilateral bases.  Musculoskeletal: She exhibits no edema.  Neurological: She is alert.  Skin: Skin is warm. Capillary refill takes less than 2 seconds.  Psychiatric: She has a normal mood and affect.     ED Treatments / Results  Labs (all labs ordered are listed, but only abnormal results are displayed) Labs Reviewed  COMPREHENSIVE METABOLIC PANEL - Abnormal; Notable for the following:       Result Value   Glucose, Bld 193 (*)    BUN 72 (*)    Creatinine, Ser 4.67 (*)    Calcium 8.6 (*)    ALT 11 (*)    GFR calc non Af Amer 9 (*)    GFR calc Af Amer 10 (*)    All other components within normal limits  BRAIN NATRIURETIC PEPTIDE - Abnormal; Notable for the following:    B Natriuretic Peptide 3,008.9 (*)    All other components within normal limits  TROPONIN I - Abnormal; Notable for the following:    Troponin I 0.14 (*)    All other components within normal limits  CBC WITH DIFFERENTIAL/PLATELET - Abnormal; Notable for the following:    RBC 2.82 (*)    Hemoglobin 8.3 (*)    HCT 26.2 (*)    Neutro Abs 8.4 (*)    Lymphs Abs 0.3 (*)    All other components within normal limits    EKG  EKG Interpretation  Date/Time:  Tuesday February 13 2017 13:19:50 EDT Ventricular Rate:  79 PR Interval:    QRS Duration: 108 QT Interval:  440 QTC Calculation: 505 R Axis:   54 Text Interpretation:  Sinus rhythm LVH with secondary repolarization abnormality Anterior ST elevation, probably due to LVH Prolonged QT interval SINCE LAST TRACING HEART RATE HAS INCREASED Confirmed by Rubin Payor  MD, Deen Deguia [phone]) on 02/13/2017 1:32:10 PM  Radiology Dg Chest Portable 1 View  Result Date: 02/13/2017 CLINICAL DATA:  Shortness of breath. EXAM:  PORTABLE CHEST 1 VIEW COMPARISON:  Radiograph of August 24, 2016. FINDINGS: Stable cardiomegaly . Atherosclerosis of thoracic aorta is noted. No pneumothorax is noted. Bilateral perihilar and basilar densities are noted most consistent with pulmonary edema with associated pleural effusions. Bony thorax is unremarkable. Status post coronary artery bypass graft IMPRESSION: Aortic atherosclerosis. Moderate bilateral perihilar and basilar lung opacities are noted concerning for edema with associated pleural effusions. These findings are concerning for congestive heart failure. Electronically Signed   By: Lupita Raider, M.D.   On: 02/13/2017 14:15    Procedures Procedures (including critical care time)  Medications Ordered in ED Medications - No data to display   Initial Impression / Assessment and Plan / ED Course  I have reviewed the triage vital signs and the nursing notes.  Pertinent labs & imaging results that were available during my care of the patient were reviewed by me and considered in my medical decision making (see chart for details).     Patient was shortness of breath nursing home. Hypoxic on room air into the 60s. Appears to be in CHF. History same. BNP elevated. Troponin mildly elevated. EKG stable. Patient is a DO NOT RESUSCITATE. Admit to internal medicine. Discussed with Dr. Elisabeth Pigeon.  Final Clinical Impressions(s) / ED Diagnoses   Final diagnoses:  Acute on chronic congestive heart failure, unspecified heart failure type [facility])    New Prescriptions New Prescriptions   No medications on file     Benjiman Core, MD 02/13/17 1540

## 2017-02-13 NOTE — ED Triage Notes (Signed)
Per [facility] patient comes from [facility] for O2 sat in 70s patient normally not on oxygen. Patient reported to EMS that she had PNA last week but uncertain if she was given any medications to treat it.  .  Patient was placed on 4L O2 via N/C O2 came up to 90-94%.

## 2017-02-13 NOTE — Progress Notes (Signed)
PHARMACIST - PHYSICIAN ORDER COMMUNICATION  CONCERNING: P&T Medication Policy on Herbal Medications  DESCRIPTION:  This patient's order for: Melatonin has been noted.  This product(s) is classified as an "herbal" or natural product. Due to a lack of definitive safety studies or FDA approval, nonstandard manufacturing practices, plus the potential risk of unknown drug-drug interactions while on inpatient medications, the Pharmacy and Therapeutics Committee does not permit the use of "herbal" or natural products of this type within [facility].   ACTION TAKEN: The pharmacy department is unable to verify this order at this time and your patient has been informed of this safety policy. Please reevaluate patient's clinical condition at discharge and address if the herbal or natural product(s) should be resumed at that time.   Greer PickerelJigna Teniyah Seivert, PharmD, BCPS Pager: [phone] 02/13/2017 5:44 PM

## 2017-02-13 NOTE — H&P (Signed)
History and Physical    Taylor HueDorcas Willingham ZOX:[id number] DOB: 24-Mar-1946 DOA: 02/13/2017  Referring MD/NP/PA: Dr. Rubin PayorPickering   PCP: Georgann HousekeeperHusain, Karrar, MD   Patient coming from: from SNF  Chief Complaint: shortness of breath, low oxygen saturation    HPI: Taylor Padilla is a 71 y.o. female with medical history significant for chronic diastolic CHF, 2 D ECHO in 08/2016 showed EF 55%, no regional wall motion abnormalities, hypertension, CAD status post MI, diabetes, history CVA. [facility] with low oxygen saturation in SNF, around 70's. When patient arrived to [facility] O2 sat was 65% on room air. Patient reported some shortness of breath but no respiratory distress. No cough, no fevers. Pt is legally blind. No abdominal pain, she does have some nausea but no vomiting. No urinary complaints.  Last hospitalization in 08/2016 for RSV pneumonia.  ED Course: In ED, pt was afebrile, RR 17-25, BP 146/49, Oxygen saturation 65% which has improved with [place] oxygen support to 95%. Blood work showed hgb of 8.3, creatinine 4.67, troponin 0.14, 0.20. CXR showed moderate bilateral perihilar and basilar lung opacities concerning for congestive heart failure. No significant edema on physical exam and respiratory status stable. She cannot get Lasix due to renal insufficiency.   Review of Systems:  Constitutional: Negative for fever, chills, diaphoresis, activity change, appetite change and fatigue.  HENT: Negative for ear pain, nosebleeds, congestion, facial swelling, rhinorrhea, neck pain, neck stiffness and ear discharge.   Eyes: Negative for pain, discharge, redness, itching and visual disturbance.  Respiratory: Negative for cough, choking, chest tightness, positive for shortness of breath, no wheezing and stridor.   Cardiovascular: Negative for chest pain, palpitations and leg swelling.  Gastrointestinal: Negative for abdominal distention.  Genitourinary: Negative for dysuria, urgency, frequency, hematuria, flank  pain, decreased urine volume, difficulty urinating and dyspareunia.  Musculoskeletal: Negative for back pain, joint swelling, arthralgias and gait problem.  Neurological: Negative for dizziness, tremors, seizures, syncope, facial asymmetry, speech difficulty, weakness, light-headedness, numbness and headaches.  Hematological: Negative for adenopathy. Does not bruise/bleed easily.  Psychiatric/Behavioral: Negative for hallucinations, behavioral problems, confusion, dysphoric mood, decreased concentration and agitation.   Past Medical History:  Diagnosis Date  . Blind in both eyes    a. legally blind since 2005 per pt.  Marland Kitchen. CAD (coronary artery disease)    a. 01/2002 s/p CABG x 4.  . Chronic diastolic CHF (congestive heart failure) (HCC)    a. 08/2016 Echo: EF 50-55%, no rwma, mild MS, mod MR, sev dil LA, nl RV fxn, mod TR, PASP 83mmHg.  . CKD (chronic kidney disease), stage IV (HCC)   . GERD (gastroesophageal reflux disease)   . Glaucoma, both eyes    a. legally blind.  . High cholesterol   . History of blood transfusion 2003   "related to OHS"  . Hypertensive heart disease   . Hypothyroidism   . Macular edema    hx  . Normocytic anemia    a. 08/2016 s/p 2u PRBCs for H/H 6/20.  Marland Kitchen. PAH (pulmonary artery hypertension) (HCC)    a. 08/2016 Echo: PASP 83mmHg.  Marland Kitchen. Pneumonia 1990's X 1  . Stroke [facility](HCC)    "I've been told I've had one; I don't remember having any reaction from it at all"; denies residual on 10/08/2014  . Type II diabetes mellitus (HCC)   . Valvular heart disease    a. 08/2016 Echo: Mild MS, Mod MR/TR.    Past Surgical History:  Procedure Laterality Date  . CATARACT EXTRACTION W/ INTRAOCULAR  LENS  IMPLANT, BILATERAL Bilateral   . CORONARY ARTERY BYPASS GRAFT  01/2002   "CABG X4" at [facility]    Social history:  reports that she has never smoked. She has never used smokeless tobacco. She reports that she does not drink alcohol or use drugs.  Ambulation: ambulates with  walker and cane as needed.  Allergies  Allergen Reactions  . Epinephrine Other (See Comments)    Patient says she is not allergic to epi, but that it makes her jittery.  WILL RECEIVE IF NEEDED IN AN EMERGENCY.    Family History  Problem Relation Age of Onset  . CAD Neg Hx     Prior to Admission medications   Medication Sig Start Date End Date Taking? Authorizing Provider  acetaminophen (TYLENOL) 500 MG tablet Take 500 mg by mouth every 4 (four) hours as needed for fever (pain).    Yes [provider]  amLODipine (NORVASC) 10 MG tablet Take 10 mg by mouth daily. 09/27/14  Yes [provider]  atorvastatin (LIPITOR) 80 MG tablet Take 80 mg by mouth at bedtime. 09/21/14  Yes [provider]  chlorhexidine (PERIDEX) 0.12 % solution Use as directed 15 mLs in the mouth or throat 2 (two) times daily.   Yes [provider]  Cholecalciferol (VITAMIN D3) 50000 units CAPS Take 50,000 Units by mouth every 30 (thirty) days. Take 1 capsule (50,000 units) by mouth on the 10th of each month   Yes [provider]  clopidogrel (PLAVIX) 75 MG tablet Take 75 mg by mouth daily. 09/24/14  Yes [provider]  Darbepoetin Alfa (ARANESP) 25 MCG/0.42ML SOSY injection Inject 25 mcg into the skin every 7 (seven) days. On Wednesday   Yes [provider]  donepezil (ARICEPT) 5 MG tablet Take 5 mg by mouth at bedtime.   Yes [provider]  dorzolamide-timolol (COSOPT) 22.3-6.8 MG/ML ophthalmic solution Place 1 drop into both eyes every 12 (twelve) hours. 10/06/14  Yes [provider]  doxazosin (CARDURA) 2 MG tablet Take 1 tablet (2 mg total) by mouth daily. 08/24/16  Yes Mikhail, Nita Sells, DO  furosemide (LASIX) 40 MG tablet Take 1 tablet (40 mg total) by mouth 2 (two) times daily. 08/24/16  Yes Mikhail, Nita Sells, DO  hydrALAZINE (APRESOLINE) 100 MG tablet Take 1 tablet (100 mg total) by mouth every 8 (eight) hours. 11/03/14  Yes Vann, Jessica U,  DO  insulin aspart (NOVOLOG) 100 UNIT/ML injection Inject 2-12 Units into the skin 4 (four) times daily. Per sliding scale: CBG 151-200 2 units, 201-250 4 units, 251-300 6 units, 301-350 8 units, 351-400 10 units, 401-450 12 units, Call MD for CBG >451 or <60   Yes [provider]  isosorbide dinitrate (ISORDIL) 20 MG tablet Take 20 mg by mouth 3 (three) times daily. 9am, 1pm, 5pm   Yes [provider]  lactobacillus acidophilus (BACID) TABS tablet Take 1 tablet by mouth daily.   Yes [provider]  latanoprost (XALATAN) 0.005 % ophthalmic solution Place 1 drop into the right eye at bedtime.   Yes [provider]  levothyroxine (SYNTHROID, LEVOTHROID) 100 MCG tablet Take 1 tablet (100 mcg total) by mouth daily before breakfast. 11/26/14  Yes Short, Thea Silversmith, MD  loperamide (IMODIUM A-D) 2 MG tablet Take 2 mg by mouth every 3 (three) hours as needed for diarrhea or loose stools. Maximum 8 tablets in 24 hours   Yes [provider]  Magnesium Hydroxide (MILK OF MAGNESIA PO) Take 30 mLs by mouth  at bedtime as needed (constipation).   Yes [provider]  Melatonin 3 MG TABS Take 3 mg by mouth at bedtime.   Yes [provider]  mirtazapine (REMERON) 15 MG tablet Take 15 mg by mouth at bedtime. For appetite   Yes [provider]  ondansetron (ZOFRAN) 4 MG tablet Take 4 mg by mouth every 6 (six) hours as needed for nausea or vomiting.   Yes [provider]  OXYGEN Inhale 2 L into the lungs See admin instructions. "Apply 2 liters per minute via nasal canula for shortness of breath"   Yes [provider]  pantoprazole (PROTONIX) 20 MG tablet Take 20 mg by mouth daily at 6 (six) AM.    Yes [provider]  polyvinyl alcohol (LIQUIFILM TEARS) 1.4 % ophthalmic solution Place 1 drop into the left eye 4 (four) times daily.   Yes [provider]  prednisoLONE acetate (PRED FORTE) 1 % ophthalmic suspension  Place 1 drop into the left eye 4 (four) times daily. 09/27/14  Yes [provider]  sertraline (ZOLOFT) 50 MG tablet Take 50 mg by mouth daily.   Yes [provider]    Physical Exam: Vitals:   02/13/17 1322 02/13/17 1420 02/13/17 1558 02/13/17 1600  BP:  (!) 169/81 (!) 164/64 (!) 162/61  Pulse:  70 68 66  Resp:  (!) 22 20 17   Temp:  99.7 F (37.6 C)    TempSrc:  Rectal    SpO2: 92% 95% 93% 94%    Constitutional: NAD, calm, appears ill Vitals:   02/13/17 1322 02/13/17 1420 02/13/17 1558 02/13/17 1600  BP:  (!) 169/81 (!) 164/64 (!) 162/61  Pulse:  70 68 66  Resp:  (!) 22 20 17   Temp:  99.7 F (37.6 C)    TempSrc:  Rectal    SpO2: 92% 95% 93% 94%   Eyes: PERRL, lids and conjunctivae normal ENMT: Mucous membranes are moist. Posterior pharynx clear of any exudate or lesions.Normal dentition.  Neck: normal, supple, no masses, no thyromegaly Respiratory: diminished breath sounds, no wheezing   Cardiovascular: Regular rate and rhythm, no murmurs / rubs / gallops. No extremity edema. 2+ pedal pulses. No carotid bruits.  Abdomen: no tenderness, no masses palpated. No hepatosplenomegaly. Bowel sounds positive.  Musculoskeletal: no clubbing / cyanosis. No joint deformity upper and lower extremities. Good ROM, no contractures. Normal muscle tone.  Skin: no rashes, lesions, ulcers. No induration Neurologic: CN 2-12 grossly intact. Sensation intact, DTR normal. Strength 5/5 in all 4.  Psychiatric: Normal judgment and insight. Alert and oriented x 3. Normal mood.   Labs on Admission: I have personally reviewed following labs and imaging studies  CBC:  Recent Labs Lab 02/13/17 1416  WBC 9.0  NEUTROABS 8.4*  HGB 8.3*  HCT 26.2*  MCV 92.9  PLT 270   Basic Metabolic Panel:  Recent Labs Lab 02/13/17 1416  NA 139  K 4.1  CL 106  CO2 24  GLUCOSE 193*  BUN 72*  CREATININE 4.67*  CALCIUM 8.6*   GFR: CrCl cannot be calculated (Unknown ideal weight.). Liver  Function Tests:  Recent Labs Lab 02/13/17 1416  AST 17  ALT 11*  ALKPHOS 71  BILITOT 0.7  PROT 7.1  ALBUMIN 3.5   No results for input(s): LIPASE, AMYLASE in the last 168 hours. No results for input(s): AMMONIA in the last 168 hours. Coagulation Profile: No results for input(s): INR, PROTIME in the last 168 hours. Cardiac Enzymes:  Recent Labs  Lab 02/13/17 1416  TROPONINI 0.14*   BNP (last 3 results) No results for input(s): PROBNP in the last 8760 hours. HbA1C: No results for input(s): HGBA1C in the last 72 hours. CBG: No results for input(s): GLUCAP in the last 168 hours. Lipid Profile: No results for input(s): CHOL, HDL, LDLCALC, TRIG, CHOLHDL, LDLDIRECT in the last 72 hours. Thyroid Function Tests: No results for input(s): TSH, T4TOTAL, FREET4, T3FREE, THYROIDAB in the last 72 hours. Anemia Panel: No results for input(s): VITAMINB12, FOLATE, FERRITIN, TIBC, IRON, RETICCTPCT in the last 72 hours.   Sepsis Labs: @LABRCNTIP (procalcitonin:4,lacticidven:4) )No results found for this or any previous visit (from the past 240 hour(s)).   Radiological Exams on Admission: Dg Chest Portable 1 View  Result Date: 02/13/2017 CLINICAL DATA:  Shortness of breath. EXAM: PORTABLE CHEST 1 VIEW COMPARISON:  Radiograph of August 24, 2016. FINDINGS: Stable cardiomegaly . Atherosclerosis of thoracic aorta is noted. No pneumothorax is noted. Bilateral perihilar and basilar densities are noted most consistent with pulmonary edema with associated pleural effusions. Bony thorax is unremarkable. Status post coronary artery bypass graft IMPRESSION: Aortic atherosclerosis. Moderate bilateral perihilar and basilar lung opacities are noted concerning for edema with associated pleural effusions. These findings are concerning for congestive heart failure. Electronically Signed   By: Lupita Raider, M.D.   On: 02/13/2017 14:15    EKG: Independently reviewed. Sinus rhythm  Assessment/Plan     Principal Problem:   Acute on chronic respiratory failure with hypoxia (HCC) / Acute on chronic diastolic congestive heart failure (HCC) - CHF focused order set utilized  - BNP in 300 range on admission - 2 D ECHO in 2017 showed EF 55% - Obtain 2 D ECHO on this admission - Strict intake and output, daily weight - Not on lasix due to elevated Cr; will see what cr is in am and if stable consider starting lasix  - Please consult cardio in am  Active Problems:   Troponin level elevated - Likely demand ischemia from hypoxia, CJD - No chest pain - No acute ischemic changes on 12 lead EKG - Cycle cardiac enzymes - Obtain 2 D ECHO - Consult cardio in am    Type 2 diabetes mellitus with diabetic nephropathy, with long-term current use of insulin (HCC) - Resume SSI    Hypothyroidism, acquired - Continue levothyroxine    Dyslipidemia - Continue Lipitor 80 mg at bedtime    Coronary artery disease involving native coronary artery of native heart without angina pectoris - Continue plavix, statin therapy     Essential hypertension - Resumed Norvasc, hydralazine, isordil     CKD stage 5 due to type 2 diabetes mellitus (HCC) - Cr on 5/21/21088 was 4.92 - Cr on this admission still elevated but within baseline range - Monitor BMP daily    Anemia of chronic kidney failure, stage 5 (HCC) 0 Monitor CBC daily - Hgb stable     GERD without esophagitis - Continue PPI therapy    DVT prophylaxis: SCD's  Code Status: full code Family Communication: no family at the bedside  Disposition Plan: admission to telemetry  Consults called: none   Disposition plan: Further plan will depend as patient's clinical course evolves and further radiologic and laboratory data become available.    At the time of admission, it appears that the appropriate admission status for this patient is INPATIENT .Thisis judged to be reasonable and necessary in order to provide the required intensity of service  to ensure the patient's safetygiven the patient  presentation of shortness of breath and hypoxia in addition to physical exam findings, radiographic and laboratory data in the context of chronic comorbidities.    Manson Passey MD [facility] Pager [phone]  If 7PM-7AM, please contact night-coverage [email or web address] Password [facility]  02/13/2017, 4:55 PM

## 2017-02-13 NOTE — ED Notes (Signed)
Stuck patient 2 attempts for IV access and blood work. Patient would jump, scream and demand to stop and take needle put due to "that hurts, stop it, I am refusing to have my blood work drawn!"

## 2017-02-14 ENCOUNTER — Inpatient Hospital Stay (HOSPITAL_COMMUNITY): Payer: Medicare Other

## 2017-02-14 DIAGNOSIS — I10 Essential (primary) hypertension: Secondary | ICD-10-CM

## 2017-02-14 DIAGNOSIS — D631 Anemia in chronic kidney disease: Secondary | ICD-10-CM

## 2017-02-14 DIAGNOSIS — I360 Nonrheumatic tricuspid (valve) stenosis: Secondary | ICD-10-CM

## 2017-02-14 DIAGNOSIS — E1122 Type 2 diabetes mellitus with diabetic chronic kidney disease: Secondary | ICD-10-CM

## 2017-02-14 DIAGNOSIS — I5033 Acute on chronic diastolic (congestive) heart failure: Secondary | ICD-10-CM

## 2017-02-14 DIAGNOSIS — J9621 Acute and chronic respiratory failure with hypoxia: Secondary | ICD-10-CM

## 2017-02-14 DIAGNOSIS — N185 Chronic kidney disease, stage 5: Secondary | ICD-10-CM

## 2017-02-14 LAB — GLUCOSE, CAPILLARY
GLUCOSE-CAPILLARY: 156 mg/dL — AB (ref 65–99)
GLUCOSE-CAPILLARY: 154 mg/dL — AB (ref 65–99)
GLUCOSE-CAPILLARY: 128 mg/dL — AB (ref 65–99)
Glucose-Capillary: 140 mg/dL — ABNORMAL HIGH (ref 65–99)
Glucose-Capillary: 139 mg/dL — ABNORMAL HIGH (ref 65–99)

## 2017-02-14 LAB — ECHOCARDIOGRAM COMPLETE
AVLVOTPG: 7 mmHg
CHL CUP MV DEC (S): 165
CHL CUP TV REG PEAK VELOCITY: 425 cm/s
E decel time: 165 msec
FS: 33 % (ref 28–44)
Height: 61 in
IVS/LV PW RATIO, ED: 0.73
LA ID, A-P, ES: 56 mm
LA diam end sys: 56 mm
LA diam index: 3.26 cm/m2
LAVOLA4C: 81 mL
LV PW d: 12.3 mm — AB (ref 0.6–1.1)
LVOT SV: 45 mL
LVOT VTI: 29 cm
LVOT area: 1.54 cm2
LVOTD: 14 mm
LVOTPV: 133 cm/s
MV Peak grad: 12 mmHg
MV pk E vel: 171 m/s
MVPKAVEL: 123 m/s
RV LATERAL S' VELOCITY: 7.29 cm/s
RV TAPSE: 19.5 mm
RV sys press: 80 mmHg
TR max vel: 425 cm/s
WEIGHTICAEL: 2366.86 [oz_av]

## 2017-02-14 LAB — BASIC METABOLIC PANEL
Anion gap: 12 (ref 5–15)
BUN: 71 mg/dL — ABNORMAL HIGH (ref 6–20)
CALCIUM: 8.6 mg/dL — AB (ref 8.9–10.3)
CHLORIDE: 103 mmol/L (ref 101–111)
CO2: 26 mmol/L (ref 22–32)
CREATININE: 4.68 mg/dL — AB (ref 0.44–1.00)
GFR calc non Af Amer: 8 mL/min — ABNORMAL LOW (ref >60)
GFR, EST AFRICAN AMERICAN: 10 mL/min — AB (ref >60)
GLUCOSE: 137 mg/dL — AB (ref 65–99)
Potassium: 3.8 mmol/L (ref 3.5–5.1)
Sodium: 141 mmol/L (ref 135–145)

## 2017-02-14 LAB — TROPONIN I
TROPONIN I: 0.25 ng/mL — AB (ref <0.03)
Troponin I: 0.15 ng/mL (ref <0.03)

## 2017-02-14 LAB — CBC
HCT: 23.7 % — ABNORMAL LOW (ref 36.0–46.0)
Hemoglobin: 7.4 g/dL — ABNORMAL LOW (ref 12.0–15.0)
MCH: 28.8 pg (ref 26.0–34.0)
MCHC: 31.2 g/dL (ref 30.0–36.0)
MCV: 92.2 fL (ref 78.0–100.0)
PLATELETS: 244 10*3/uL (ref 150–400)
RBC: 2.57 MIL/uL — AB (ref 3.87–5.11)
RDW: 14.7 % (ref 11.5–15.5)
WBC: 8.5 10*3/uL (ref 4.0–10.5)

## 2017-02-14 MED ORDER — FUROSEMIDE 10 MG/ML IJ SOLN
80.0000 mg | Freq: Two times a day (BID) | INTRAMUSCULAR | Status: DC
  Administered 2017-02-14 – 2017-02-15 (×2): 80 mg via INTRAVENOUS
  Filled 2017-02-14: qty 8

## 2017-02-14 MED ORDER — RISAQUAD PO CAPS
1.0000 | ORAL_CAPSULE | Freq: Every day | ORAL | Status: DC
  Administered 2017-02-14 – 2017-02-18 (×5): 1 via ORAL
  Filled 2017-02-14 (×5): qty 1

## 2017-02-14 MED ORDER — FUROSEMIDE 10 MG/ML IJ SOLN: 60.0000 mg | Freq: Two times a day (BID) | INTRAMUSCULAR | Status: DC

## 2017-02-14 MED ORDER — FUROSEMIDE 10 MG/ML IJ SOLN
60.0000 mg | Freq: Once | INTRAMUSCULAR | Status: DC
  Filled 2017-02-14: qty 6

## 2017-02-14 NOTE — Consult Note (Addendum)
Cardiology Consult    Patient ID: Taylor Padilla MRN: [id number], DOB/AGE: 01-29-1946   Admit date: 02/13/2017 Date of Consult: 02/14/2017  Primary Physician: Georgann Housekeeper, MD Primary Cardiologist: Dr. Gala Romney Requesting Provider: Dr. Edward Jolly  Reason for Consult: acute on chronic diastolic heart failure   Patient Profile    Taylor Padilla has a PMH significant for CAD s/p CABG x 4 (2003), chronic diastolic heart  Failure, HTN, HLD, DM, CKD stage III-IV, glaucoma with legal blindness, valvular heart disease (mild MR), hypothyroidism, GERD, and anemia.   Taylor Padilla is a 71 y.o. female who is being seen today for the evaluation of acute on chronic diastolic heart failure at the request of Dr. Edward Jolly.   Past Medical History   Past Medical History:  Diagnosis Date  . Blind in both eyes    a. legally blind since 2005 per pt.  Marland Kitchen CAD (coronary artery disease)    a. 01/2002 s/p CABG x 4.  . Chronic diastolic CHF (congestive heart failure) (HCC)    a. 08/2016 Echo: EF 50-55%, no rwma, mild MS, mod MR, sev dil LA, nl RV fxn, mod TR, PASP .  . CKD (chronic kidney disease), stage IV (HCC)   . GERD (gastroesophageal reflux disease)   . Glaucoma, both eyes    a. legally blind.  . High cholesterol   . History of blood transfusion 2003   "related to OHS"  . Hypertensive heart disease   . Hypothyroidism   . Macular edema    hx  . Normocytic anemia    a. 08/2016 s/p 2u PRBCs for H/H 6/20.  Marland Kitchen PAH (pulmonary artery hypertension) (HCC)    a. 08/2016 Echo: PASP .  Marland Kitchen Pneumonia 1990's X 1  . Stroke [facility])    "I've been told I've had one; I don't remember having any reaction from it at all"; denies residual on 10/08/2014  . Type II diabetes mellitus (HCC)   . Valvular heart disease    a. 08/2016 Echo: Mild MS, Mod MR/TR.    Past Surgical History:  Procedure Laterality Date  . CATARACT EXTRACTION W/ INTRAOCULAR LENS  IMPLANT, BILATERAL Bilateral   . CORONARY ARTERY BYPASS GRAFT   01/2002   "CABG X4" at [facility]     Allergies  Allergies  Allergen Reactions  . Epinephrine Other (See Comments)    Patient says she is not allergic to epi, but that it makes her jittery.  WILL RECEIVE IF NEEDED IN AN EMERGENCY.    History of Present Illness    Taylor Padilla is known to our service and last saw Dr. Gala Romney in clinic on 09/15/16. At that time she was in her usual state of health and was following up after a hospitalization 08/20/16-08/24/16. She was hospitalized with acute diastolic heart failure exacerbation. She was diuresed and discharged. A mildly elevated troponin (peak 0.06) was managed medically with plavix, statin, and nitrate. BB was not added at that time due to bradycardia and ACEI was held due to acute on CKD.  She continued lasix regimen at home.   Taylor Padilla was brought to [facility] via EMS for hypoxia in the 70s at her SNF. She reproted shortness of breath, but no respiratory distress. In the ED, she was 65% on room air, which improved to 95% on [place]. CXR with congestive heart failure and bibasilar airspace disease consistent with atelectasis vs infection. Cardiology was consulted.  On my interview, she denies chest pain, palpitations, N/V, dizziness, and feelings of syncope. She states  she feels short of breath and has occasional leg swelling. She remains on supplemental O2 via [place].   Inpatient Medications    . acidophilus  1 capsule Oral Daily  . amLODipine  10 mg Oral Daily  . atorvastatin  80 mg Oral QHS  . chlorhexidine  15 mL Mouth/Throat BID  . clopidogrel  75 mg Oral Daily  . donepezil  5 mg Oral QHS  . dorzolamide-timolol  1 drop Both Eyes Q12H  . doxazosin  2 mg Oral Daily  . furosemide  60 mg Intravenous Once  . hydrALAZINE  100 mg Oral Q8H  . insulin aspart  2-12 Units Subcutaneous QID  . isosorbide dinitrate  20 mg Oral TID  . latanoprost  1 drop Right Eye QHS  . levothyroxine  100 mcg Oral QAC breakfast  . mirtazapine  15 mg Oral QHS  .  pantoprazole  20 mg Oral Q0600  . polyvinyl alcohol  1 drop Left Eye QID  . prednisoLONE acetate  1 drop Left Eye QID  . sertraline  50 mg Oral Daily  . sodium chloride flush  3 mL Intravenous Q12H  . [START ON 03/13/2017] Vitamin D3  50,000 Units Oral Q30 days     Outpatient Medications    Prior to Admission medications   Medication Sig Start Date End Date Taking? Authorizing Provider  acetaminophen (TYLENOL) 500 MG tablet Take 500 mg by mouth every 4 (four) hours as needed for fever (pain).    Yes [provider]  amLODipine (NORVASC) 10 MG tablet Take 10 mg by mouth daily. 09/27/14  Yes [provider]  atorvastatin (LIPITOR) 80 MG tablet Take 80 mg by mouth at bedtime. 09/21/14  Yes [provider]  chlorhexidine (PERIDEX) 0.12 % solution Use as directed 15 mLs in the mouth or throat 2 (two) times daily.   Yes [provider]  Cholecalciferol (VITAMIN D3) 50000 units CAPS Take 50,000 Units by mouth every 30 (thirty) days. Take 1 capsule (50,000 units) by mouth on the 10th of each month   Yes [provider]  clopidogrel (PLAVIX) 75 MG tablet Take 75 mg by mouth daily. 09/24/14  Yes [provider]  Darbepoetin Alfa (ARANESP) 25 MCG/0.42ML SOSY injection Inject 25 mcg into the skin every 7 (seven) days. On Wednesday   Yes [provider]  donepezil (ARICEPT) 5 MG tablet Take 5 mg by mouth at bedtime.   Yes [provider]  dorzolamide-timolol (COSOPT) 22.3-6.8 MG/ML ophthalmic solution Place 1 drop into both eyes every 12 (twelve) hours. 10/06/14  Yes [provider]  doxazosin (CARDURA) 2 MG tablet Take 1 tablet (2 mg total) by mouth daily. 08/24/16  Yes Mikhail, Nita Sells, DO  furosemide (LASIX) 40 MG tablet Take 1 tablet (40 mg total) by mouth 2 (two) times daily. 08/24/16  Yes Mikhail, Nita Sells, DO  hydrALAZINE (APRESOLINE) 100 MG tablet Take 1 tablet (100 mg total) by mouth every 8 (eight) hours. 11/03/14  Yes Vann,  Jessica U, DO  insulin aspart (NOVOLOG) 100 UNIT/ML injection Inject 2-12 Units into the skin 4 (four) times daily. Per sliding scale: CBG 151-200 2 units, 201-250 4 units, 251-300 6 units, 301-350 8 units, 351-400 10 units, 401-450 12 units, Call MD for CBG >451 or <60   Yes [provider]  isosorbide dinitrate (ISORDIL) 20 MG tablet Take 20 mg by mouth 3 (three) times daily. 9am, 1pm, 5pm   Yes [provider]  lactobacillus acidophilus (BACID) TABS tablet Take 1 tablet  by mouth daily.   Yes [provider]  latanoprost (XALATAN) 0.005 % ophthalmic solution Place 1 drop into the right eye at bedtime.   Yes [provider]  levothyroxine (SYNTHROID, LEVOTHROID) 100 MCG tablet Take 1 tablet (100 mcg total) by mouth daily before breakfast. 11/26/14  Yes Short, Thea Silversmith, MD  loperamide (IMODIUM A-D) 2 MG tablet Take 2 mg by mouth every 3 (three) hours as needed for diarrhea or loose stools. Maximum 8 tablets in 24 hours   Yes [provider]  Magnesium Hydroxide (MILK OF MAGNESIA PO) Take 30 mLs by mouth at bedtime as needed (constipation).   Yes [provider]  Melatonin 3 MG TABS Take 3 mg by mouth at bedtime.   Yes [provider]  mirtazapine (REMERON) 15 MG tablet Take 15 mg by mouth at bedtime. For appetite   Yes [provider]  ondansetron (ZOFRAN) 4 MG tablet Take 4 mg by mouth every 6 (six) hours as needed for nausea or vomiting.   Yes [provider]  OXYGEN Inhale 2 L into the lungs See admin instructions. "Apply 2 liters per minute via nasal canula for shortness of breath"   Yes [provider]  pantoprazole (PROTONIX) 20 MG tablet Take 20 mg by mouth daily at 6 (six) AM.    Yes [provider]  polyvinyl alcohol (LIQUIFILM TEARS) 1.4 % ophthalmic solution Place 1 drop into the left eye 4 (four) times daily.   Yes [provider]  prednisoLONE acetate (PRED FORTE) 1 % ophthalmic  suspension Place 1 drop into the left eye 4 (four) times daily. 09/27/14  Yes [provider]  sertraline (ZOLOFT) 50 MG tablet Take 50 mg by mouth daily.   Yes [provider]     Family History    Family History  Problem Relation Age of Onset  . CAD Neg Hx     Social History    Social History   Social History  . Marital status: Widowed    Spouse name: N/A  . Number of children: N/A  . Years of education: N/A   Occupational History  . Not on file.   Social History Main Topics  . Smoking status: Never Smoker  . Smokeless tobacco: Never Used  . Alcohol use No  . Drug use: No  . Sexual activity: No   Other Topics Concern  . Not on file   Social History Narrative   Live in [facility], moved from [place] in Dec 2015.     Review of Systems    General:  No chills, fever, night sweats or weight changes.  Cardiovascular:  No chest pain, + dyspnea on exertion, + edema, no orthopnea, palpitations, paroxysmal nocturnal dyspnea. Dermatological: No rash, lesions/masses Respiratory: No cough, + dyspnea Urologic: No hematuria, dysuria Abdominal:   No nausea, vomiting, diarrhea, bright red blood per rectum, melena, or hematemesis Neurologic:  No visual changes, wkns, changes in mental status. All other systems reviewed and are otherwise negative except as noted above.  Physical Exam    Blood pressure (!) 156/66, pulse 76, temperature 98.4 F (36.9 C), temperature source Oral, resp. rate 18, height 5\' 1"  (1.549 m), weight 147 lb 14.9 oz (67.1 kg), SpO2 92 %.  General: Pleasant, NAD Psych: Normal affect. Neuro: Alert and oriented X 3. Moves all extremities spontaneously. HEENT: Normal  Neck: Supple without bruits, +JVD. Lungs:  Resp regular and unlabored, coarse sounds throughout and expiratory wheezing Heart: RRR no  s3, s4, or murmurs. Abdomen: Soft, non-tender, non-distended, BS + x 4.  Extremities: No clubbing, cyanosis or  edema. DP/PT/Radials 2+ and equal bilaterally.  Labs    Troponin (Point of Care Test) No results for input(s): TROPIPOC in the last 72 hours.  Recent Labs  02/13/17 1741 02/13/17 1939 02/13/17 2259 02/14/17 0547  TROPONINI 0.20* 0.22* 0.25* 0.15*   Lab Results  Component Value Date   WBC 8.5 02/14/2017   HGB 7.4 (L) 02/14/2017   HCT 23.7 (L) 02/14/2017   MCV 92.2 02/14/2017   PLT 244 02/14/2017    Recent Labs Lab 02/13/17 1416 02/14/17 0547  NA 139 141  K 4.1 3.8  CL 106 103  CO2 24 26  BUN 72* 71*  CREATININE 4.67* 4.68*  CALCIUM 8.6* 8.6*  PROT 7.1  --   BILITOT 0.7  --   ALKPHOS 71  --   ALT 11*  --   AST 17  --   GLUCOSE 193* 137*   No results found for: CHOL, HDL, LDLCALC, TRIG Lab Results  Component Value Date   DDIMER 1.64 (H) 08/18/2016     Radiology Studies    Dg Chest Portable 1 View  Result Date: 02/13/2017 CLINICAL DATA:  Shortness of breath. EXAM: PORTABLE CHEST 1 VIEW COMPARISON:  Radiograph of August 24, 2016. FINDINGS: Stable cardiomegaly . Atherosclerosis of thoracic aorta is noted. No pneumothorax is noted. Bilateral perihilar and basilar densities are noted most consistent with pulmonary edema with associated pleural effusions. Bony thorax is unremarkable. Status post coronary artery bypass graft IMPRESSION: Aortic atherosclerosis. Moderate bilateral perihilar and basilar lung opacities are noted concerning for edema with associated pleural effusions. These findings are concerning for congestive heart failure. Electronically Signed   By: Lupita RaiderJames  Green Jr, M.D.   On: 02/13/2017 14:15    ECG & Cardiac Imaging    EKG 02/13/17: sinus rhythm, LVH   Echocardiogram 02/14/17: pending   Echocardiogram 08/18/16: Study Conclusions - Left ventricle: The cavity size was normal. There was mild   concentric hypertrophy. Systolic function was normal. The   estimated ejection fraction was in the range of 50% to 55%. Wall   motion was normal; there  were no regional wall motion   abnormalities. - Aortic valve: Transvalvular velocity was within the normal range.   There was no stenosis. There was no regurgitation. 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   There was moderate regurgitation. - Left atrium: The atrium was severely dilated. - Right ventricle: The cavity size was normal. Wall thickness was   normal. Systolic function was normal. - Atrial septum: No defect or patent foramen ovale was identified   by color flow Doppler. - Tricuspid valve: There was moderate regurgitation. - Pulmonary arteries: Systolic pressure was severely increased. PA   peak pressure: 83 mm Hg (S). - Pericardium, extracardiac: There was a left pleural effusion.   Ascites was noted.   Echocardiogram 01/21/16: Study Conclusions - Left ventricle: The cavity size was normal. Wall thickness was   increased in a pattern of mild LVH. Systolic function was normal.   The estimated ejection fraction was in the range of 60% to 65%.   Wall motion was normal; there were no regional wall motion   abnormalities. Features are consistent with a pseudonormal left   ventricular filling pattern, with concomitant abnormal relaxation   and increased filling pressure (grade 2 diastolic dysfunction).   Doppler parameters are consistent with high ventricular filling   pressure. -  Mitral valve: There was mild regurgitation. - Left atrium: The atrium was severely dilated. - Pulmonary arteries: Systolic pressure was mildly increased.  Impressions: - Normal LV systolic function; grade 2 diastolic dysfunction with   elevated LV filling pressure; severe LAE; mild MR and TR; mildly   elevated pulonary pressure.  Assessment & Plan    1. Acute on chronic diastolic heart failure, pulmonary hypertension - repeat echo pending - BNP this admission was 3008.9, but in the setting of CKD with sCr 4.68 - she is overall net positive 800cc; weight is 147 lbs,  148 on admission; dry weight appears to be 128 lbs (08/26/16) - she has received one dose of 60 mg IV lasix - will start 80 mg IV lasix BID, no K supplement at this time - daily BMP - troponin mildly elevated: 0.22 --> 0.25 --> 0.15; she denies chest pain and EKG without signs of ischemia. This is likely secondary to demand ischemia related to acute on chronic diastolic heart failure.   2. HTN - continue home medications norvasc, hydralazine, and imdur  3. HLD, CAD s/p CABG x 4 (2003) - continue ASA and plavix, continue statin  4. CKD stage IV - recently deemed not a dialysis candidate - holding ACEI/ARB and spironolactone - monitor urine output and sCr with diuresis - per nephrology   Signed, Marcelino Duster, PA-C 02/14/2017, 8:51 AM [phone]  Patient seen and independently examined with Micah Flesher, PA. We discussed all aspects of the encounter. I agree with the assessment and plan as stated above with some modifications.  Patient has a history of Chronic diastolic CHF, CKD stage IV (deemed not a candidate for HD), HTN and CAD s/p remote CABG who was admitted with acute SOB and hypoxia with O2 sats.  Found to be in acute on chronic diastolic CHF with BNP 3000.  Started on IV lasix.  2D echo showed normal LVF with EF 60-65% with mild LVH, moderate MR due to posterior MVL restriction, moderately dilated RV with normal RVF, severe LAE, moderate TR and severe pulmonary HTN with PASP .  Trop mildly elevated with flat trend c/w demand ischemia in the setting of acute hypoxia, CHF and CKD.  Her EKG is nonischemic and she denies any angina.  Exam shows crackle at the bases and 1/3 way up, heart RRR with no M/R/G and no LE edema.  Cxray c/w CHF.  She was given IV lasix but I&Os are incomplete.  She is on Lasix 40mg  BID at home and on 40mg  IV BID now.  Will increase Lasix to 80mg  BID IV and follow renal function, I&O's and daily weights.  Continue Hydralazine 100mg  TID, amlodipine 10mg   daily, doxazosin 2mg  daily for BP control.  No BB due to acute exacerbation of CHF as well as history of bradycardia.  BP is borderline controlled so will need to watch.  Hopefully will improve with diuresis.  Cannot use ACE/ARB or spironolactone due to CKD.  She is markedly anemic which likely is exacerbating her CHF.  Anemia likely related to chronic disease - TRH to address.  She has not had any anginal symptoms.  Continue ASA, plavix, statin and nitrates.    Signed: Armanda Magic, MD [facility] 02/14/2017

## 2017-02-14 NOTE — Clinical Social Work Note (Signed)
Clinical Social Work Assessment  Patient Details  Name: Taylor Padilla MRN: [id number] Date of Birth: [birth year]  Date of referral:  02/14/17               Reason for consult:   (pt admitted from facility)                Permission sought to share information with:  Family Supports Permission granted to share information::  Yes, Verbal Permission Granted  Name::     sister in Teaching laboratory technicianlaw Jennifer  Agency::  [facility]  Relationship::     Contact Information:  [phone]  Housing/Transportation Living arrangements for the past 2 months:  Skilled Nursing Facility Source of Information:  Patient, Facility (sister-in-law) Patient Interpreter Needed:  None Criminal Activity/Legal Involvement Pertinent to Current Situation/Hospitalization:  No - Comment as needed Significant Relationships:  Other Family Members, Community Support, Siblings Lives with:  Facility Resident Do you feel safe going back to the place where you live?  Yes Need for family participation in patient care:  No (Coment)  Care giving concerns: Pt from [facility] where she is a long term resident. Has lived there since May 2017 and plans to return at [place]. No caregiving concerns reported. Has involved family. Sister- in- law reports no caregiving concerns either but does express "it seems like Laverle PatterDorcas has just given up. She lost her husband in 2014 and her sight and had to move into the nursing home and lost all her pets because she couldn't take care of them. It's been very hard for her. She doesn't leave her room much there, she doesn't like to interact with people much anymore." States facility assists her with ADLs.   Social Worker assessment / plan:  CSW consulted due to pt being resident of SNF- [facility]. Plans to return. Family not holding bed, however admissions advises that they will make effort to accommodate pt to return once ready to [place] from hospital. Will complete FL2 and provide to facility via the  HUB.  Will follow to assist with transition back to SNF when ready.  Plan: return to LTC SNF at [place]. Employment status:  Retired Database administratornsurance information:  [place], [place] PT Recommendations:  Not assessed at this time Information / Referral to community resources:     Patient/Family's Response to care:  Pt showed minimal response to care given, answered questions appropriately but otherwise was fairly unengaged. Sister-in-law expressed thanks for care  Patient/Family's Understanding of and Emotional Response to Diagnosis, Current Treatment, and Prognosis:  Pt demonstrated adequate understanding of plan but emotional response was flat. Family expressed understanding as well and hope to have pt return to [facility] and hope that pt's willingness to engage socially may improve.  Emotional Assessment Appearance:  Appears stated age Attitude/Demeanor/Rapport:   (blunt but cooperative) Affect (typically observed):  Flat, Calm Orientation:  Oriented to Self, Oriented to Place, Oriented to  Time, Oriented to Situation Alcohol / Substance use:  Not Applicable Psych involvement (Current and /or in the community):  No (Comment)  Discharge Needs  Concerns to be addressed:  Discharge Planning Concerns Readmission within the last 30 days:  No Current discharge risk:  None Barriers to Discharge:  Continued Medical Work up   Terex CorporationMeghan R Serine Kea, LCSW 02/14/2017, 10:23 AM  [phone]

## 2017-02-14 NOTE — Progress Notes (Signed)
  Echocardiogram 2D Echocardiogram has been performed.  Taylor Padilla Taylor Padilla 02/14/2017, 9:36 AM

## 2017-02-14 NOTE — Progress Notes (Addendum)
PROGRESS NOTE [facility]   Taylor Padilla   UEA:[id number] DOB: Dec 14, 1945  DOA: 02/13/2017 PCP: Georgann Housekeeper, MD   Brief Narrative:  Taylor Padilla is a 71 year old female with medical history of chronic diastolic CHF, hypertension, CAD status post MI, diabetes, legally blind, presented to the emergency department with low oxygen saturation from SNF. In the ED she was found to be hypoxic with O2 sats of 65 which improved after nasal cannula was placed also was noted to have hemoglobin of 8.3 and creatinine of 4.67 chest x-ray showed bilateral lung opacities consistent with congestive heart failure. She was admitted for acute CHF and diuresis and to monitor kidney function due to renal insufficiency  Subjective: Patient seen and examined, she reported feeling much better and breathing has improved. Patient is legally blind. Other complaints at this time she denies chest pain, palpitations, dizziness and difficulty breathing. I check her O2 saturation which was low into the mid 80s on 2 L therefore she was bumped up to 3 L with good improvement  Assessment & Plan: Acute respiratory failure with hypoxia secondary to acute on chronic diastolic heart failure. Clinical presentation consistent with fluid overload, patient with crackles, BMP over 3,000, chest x-ray consistent with CHF and shortness of breath Echo done showed normal LVEF with EF of 60-65% and mild LVH, severe pulmonary hypertension with the PASP 75 mgHg and moderate dilated RV Started on IV Lasix Strict I&O's, daily weights, low salt diet Cardiology recommendations appreciated Monitor blood pressure Not on ACE inhibitor or Aldactone due to CKD Holding beta blocker due to history of bradycardia   Elevated troponin Troponin flat trending down, no ischemic changes on EKG. This is likely demand ischemia from CHF.  Essential hypertension Continue Norvasc hydralazine and Isordil  CKD stage 5  Creatinine stable seemed to  be from baseline We'll monitor closely since patient will be on aggressive diuresis Check BMP in a.m.  Anemia of chronic disease - drop in hemoglobin likely from hemodilution patient received fluids overnight We'll check anemia panel Check CBC in a.m. Expect to improve with diuresis If hemoglobin less than 7 will transfuse  Type 2 diabetes mellitus - CBG stable Continue NovoLog Monitor CBGs  DVT prophylaxis: SCDs Code Status: Full code Family Communication: None at bedside Disposition Plan: SNF when medically stable  Consultants:   Cardiology  Procedures:   Echocardiogram ------------------------------------------------------------------- Study Conclusions  - Left ventricle: The cavity size was normal. Posterior wall   thickness was increased in a pattern of mild LVH. Systolic   function was normal. The estimated ejection fraction was in the   range of 60% to 65%. Mild hypokinesis of the anteroseptal and   inferoseptal myocardium. - Aortic valve: Transvalvular velocity was within the normal range.   There was no stenosis. There was trivial regurgitation. - Mitral valve: Calcified annulus. Mobility of the posterior   leaflet was mildly restricted. Transvalvular velocity was within   the normal range. There was no evidence for stenosis. There was   moderate regurgitation. - Left atrium: The atrium was severely dilated. - Right ventricle: The cavity size was moderately dilated. Wall   thickness was normal. Systolic function was normal. - Right atrium: The atrium was severely dilated. - Tricuspid valve: There was moderate regurgitation. - Pulmonary arteries: Systolic pressure was severely increased. PA   peak pressure: 75 mm Hg (S). - Pericardium, extracardiac: There was a left pleural effusion.  Antimicrobials: Anti-infectives    None        Objective: Vitals:  02/13/17 1730 02/13/17 2256 02/14/17 0708 02/14/17 1015  BP: (!) 167/58 (!) 147/62 (!) 156/66     Pulse:  60 76 81  Resp:   18   Temp: 98.5 F (36.9 C) 99.1 F (37.3 C) 98.4 F (36.9 C)   TempSrc: Oral Oral Oral   SpO2: 95% 93% 92%   Weight: 67.3 kg (148 lb 5.9 oz)  67.1 kg (147 lb 14.9 oz)   Height: 5\' 1"  (1.549 m)       Intake/Output Summary (Last 24 hours) at 02/14/17 1859 Last data filed at 02/14/17 1847  Gross per 24 hour  Intake          1136.67 ml  Output                0 ml  Net          1136.67 ml   Filed Weights   02/13/17 1730 02/14/17 0708  Weight: 67.3 kg (148 lb 5.9 oz) 67.1 kg (147 lb 14.9 oz)    Examination:  General exam: Appears calm and comfortable  Respiratory system: Decrease breath sound, crackles or weight up to 1/3.  Cardiovascular system: S1 & S2 heard, RRR. No JVD, murmurs, rubs or gallops Gastrointestinal system: Abdomen is nondistended, soft and nontender. No organomegaly or masses felt. Normal bowel sounds heard. Central nervous system: Alert and oriented. No focal neurological deficits. Extremities: No pedal edema. Symmetric, Skin: No rashes, lesions or ulcers Psychiatry: Judgement and insight appear normal. Mood & affect appropriate.   Data Reviewed: I have personally reviewed following labs and imaging studies  CBC:  Recent Labs Lab 02/13/17 1416 02/14/17 0547  WBC 9.0 8.5  NEUTROABS 8.4*  --   HGB 8.3* 7.4*  HCT 26.2* 23.7*  MCV 92.9 92.2  PLT 270 244   Basic Metabolic Panel:  Recent Labs Lab 02/13/17 1416 02/14/17 0547  NA 139 141  K 4.1 3.8  CL 106 103  CO2 24 26  GLUCOSE 193* 137*  BUN 72* 71*  CREATININE 4.67* 4.68*  CALCIUM 8.6* 8.6*   GFR: Estimated Creatinine Clearance: 9.5 mL/min (A) (by C-G formula based on SCr of 4.68 mg/dL (H)). Liver Function Tests:  Recent Labs Lab 02/13/17 1416  AST 17  ALT 11*  ALKPHOS 71  BILITOT 0.7  PROT 7.1  ALBUMIN 3.5   No results for input(s): LIPASE, AMYLASE in the last 168 hours. No results for input(s): AMMONIA in the last 168 hours. Coagulation  Profile: No results for input(s): INR, PROTIME in the last 168 hours. Cardiac Enzymes:  Recent Labs Lab 02/13/17 1416 02/13/17 1741 02/13/17 1939 02/13/17 2259 02/14/17 0547  TROPONINI 0.14* 0.20* 0.22* 0.25* 0.15*   BNP (last 3 results) No results for input(s): PROBNP in the last 8760 hours. HbA1C: No results for input(s): HGBA1C in the last 72 hours. CBG:  Recent Labs Lab 02/13/17 2254 02/14/17 0843 02/14/17 1158 02/14/17 1604 02/14/17 1704  GLUCAP 153* 156* 154* 140* 139*   Lipid Profile: No results for input(s): CHOL, HDL, LDLCALC, TRIG, CHOLHDL, LDLDIRECT in the last 72 hours. Thyroid Function Tests:  Recent Labs  02/13/17 1741  TSH 1.984   Anemia Panel: No results for input(s): VITAMINB12, FOLATE, FERRITIN, TIBC, IRON, RETICCTPCT in the last 72 hours. Sepsis Labs: No results for input(s): PROCALCITON, LATICACIDVEN in the last 168 hours.  Recent Results (from the past 240 hour(s))  MRSA PCR Screening     Status: Abnormal   Collection Time: 02/13/17  6:11 PM  Result Value  Ref Range Status   MRSA by PCR POSITIVE (A) NEGATIVE Final    Comment:        The GeneXpert MRSA Assay (FDA approved for NASAL specimens only), is one component of a comprehensive MRSA colonization surveillance program. It is not intended to diagnose MRSA infection nor to guide or monitor treatment for MRSA infections. RESULT CALLED TO, READ BACK BY AND VERIFIED WITHLetitia Libra: S SEAGRAVES RN 2224 02/13/17 A [facility] CenterNAVARRO          Radiology Studies: Dg Chest Portable 1 View  Result Date: 02/13/2017 CLINICAL DATA:  Shortness of breath. EXAM: PORTABLE CHEST 1 VIEW COMPARISON:  Radiograph of August 24, 2016. FINDINGS: Stable cardiomegaly . Atherosclerosis of thoracic aorta is noted. No pneumothorax is noted. Bilateral perihilar and basilar densities are noted most consistent with pulmonary edema with associated pleural effusions. Bony thorax is unremarkable. Status post coronary artery bypass  graft IMPRESSION: Aortic atherosclerosis. Moderate bilateral perihilar and basilar lung opacities are noted concerning for edema with associated pleural effusions. These findings are concerning for congestive heart failure. Electronically Signed   By: Lupita RaiderJames  Green Jr, M.D.   On: 02/13/2017 14:15      Scheduled Meds: . acidophilus  1 capsule Oral Daily  . amLODipine  10 mg Oral Daily  . atorvastatin  80 mg Oral QHS  . chlorhexidine  15 mL Mouth/Throat BID  . clopidogrel  75 mg Oral Daily  . donepezil  5 mg Oral QHS  . dorzolamide-timolol  1 drop Both Eyes Q12H  . doxazosin  2 mg Oral Daily  . furosemide  60 mg Intravenous Once  . furosemide  80 mg Intravenous BID  . hydrALAZINE  100 mg Oral Q8H  . insulin aspart  2-12 Units Subcutaneous QID  . isosorbide dinitrate  20 mg Oral TID  . latanoprost  1 drop Right Eye QHS  . levothyroxine  100 mcg Oral QAC breakfast  . mirtazapine  15 mg Oral QHS  . pantoprazole  20 mg Oral Q0600  . polyvinyl alcohol  1 drop Left Eye QID  . prednisoLONE acetate  1 drop Left Eye QID  . sertraline  50 mg Oral Daily  . sodium chloride flush  3 mL Intravenous Q12H  . [START ON 03/13/2017] Vitamin D3  50,000 Units Oral Q30 days   Continuous Infusions:   LOS: 1 day    Time spent: Total of 35 minutes spent with pt, greater than 50% of which was spent in discussion of  treatment, counseling and coordination of care   Latrelle DodrillEdwin Silva, MD Pager: Text Page via [email or web address]  [phone]  If 7PM-7AM, please contact night-coverage [email or web address] Password [email or web address] 02/14/2017, 6:59 PM

## 2017-02-15 DIAGNOSIS — D649 Anemia, unspecified: Secondary | ICD-10-CM

## 2017-02-15 LAB — IRON AND TIBC
IRON: 29 ug/dL (ref 28–170)
Saturation Ratios: 13 % (ref 10.4–31.8)
TIBC: 224 ug/dL — AB (ref 250–450)
UIBC: 195 ug/dL

## 2017-02-15 LAB — RETICULOCYTES
RBC.: 2.28 MIL/uL — ABNORMAL LOW (ref 3.87–5.11)
Retic Count, Absolute: 73 10*3/uL (ref 19.0–186.0)
Retic Ct Pct: 3.2 % — ABNORMAL HIGH (ref 0.4–3.1)

## 2017-02-15 LAB — VITAMIN B12: VITAMIN B 12: 200 pg/mL (ref 180–914)

## 2017-02-15 LAB — BASIC METABOLIC PANEL
ANION GAP: 12 (ref 5–15)
BUN: 74 mg/dL — ABNORMAL HIGH (ref 6–20)
CHLORIDE: 102 mmol/L (ref 101–111)
CO2: 25 mmol/L (ref 22–32)
Calcium: 8.2 mg/dL — ABNORMAL LOW (ref 8.9–10.3)
Creatinine, Ser: 4.55 mg/dL — ABNORMAL HIGH (ref 0.44–1.00)
GFR, EST AFRICAN AMERICAN: 10 mL/min — AB (ref >60)
GFR, EST NON AFRICAN AMERICAN: 9 mL/min — AB (ref >60)
Glucose, Bld: 209 mg/dL — ABNORMAL HIGH (ref 65–99)
POTASSIUM: 3.7 mmol/L (ref 3.5–5.1)
SODIUM: 139 mmol/L (ref 135–145)

## 2017-02-15 LAB — CBC
HCT: 21.3 % — ABNORMAL LOW (ref 36.0–46.0)
HEMOGLOBIN: 6.8 g/dL — AB (ref 12.0–15.0)
MCH: 29.8 pg (ref 26.0–34.0)
MCHC: 31.9 g/dL (ref 30.0–36.0)
MCV: 93.4 fL (ref 78.0–100.0)
PLATELETS: 235 10*3/uL (ref 150–400)
RBC: 2.28 MIL/uL — AB (ref 3.87–5.11)
RDW: 15.1 % (ref 11.5–15.5)
WBC: 8 10*3/uL (ref 4.0–10.5)

## 2017-02-15 LAB — GLUCOSE, CAPILLARY
GLUCOSE-CAPILLARY: 112 mg/dL — AB (ref 65–99)
GLUCOSE-CAPILLARY: 183 mg/dL — AB (ref 65–99)
GLUCOSE-CAPILLARY: 161 mg/dL — AB (ref 65–99)
GLUCOSE-CAPILLARY: 169 mg/dL — AB (ref 65–99)

## 2017-02-15 LAB — PREPARE RBC (CROSSMATCH)

## 2017-02-15 LAB — FOLATE: FOLATE: 16 ng/mL (ref >5.9)

## 2017-02-15 LAB — FERRITIN: Ferritin: 67 ng/mL (ref 11–307)

## 2017-02-15 MED ORDER — DOXAZOSIN MESYLATE 4 MG PO TABS
4.0000 mg | ORAL_TABLET | Freq: Every day | ORAL | Status: DC
  Administered 2017-02-15 – 2017-02-18 (×4): 4 mg via ORAL
  Filled 2017-02-15 (×4): qty 1

## 2017-02-15 MED ORDER — SODIUM CHLORIDE 0.9 % IV SOLN
510.0000 mg | Freq: Once | INTRAVENOUS | Status: AC
  Administered 2017-02-15: 510 mg via INTRAVENOUS
  Filled 2017-02-15: qty 17

## 2017-02-15 MED ORDER — FUROSEMIDE 10 MG/ML IJ SOLN
80.0000 mg | Freq: Three times a day (TID) | INTRAMUSCULAR | Status: DC
  Administered 2017-02-15 – 2017-02-16 (×3): 80 mg via INTRAVENOUS
  Filled 2017-02-15 (×3): qty 8

## 2017-02-15 NOTE — Progress Notes (Addendum)
Progress Note  Patient Name: Taylor Padilla Date of Encounter: 02/15/2017  Primary Cardiologist: Dr. Gala RomneyBensimhon  Subjective   Patient complains of SOB but thinks it is better   Inpatient Medications    Scheduled Meds: . acidophilus  1 capsule Oral Daily  . amLODipine  10 mg Oral Daily  . atorvastatin  80 mg Oral QHS  . chlorhexidine  15 mL Mouth/Throat BID  . clopidogrel  75 mg Oral Daily  . donepezil  5 mg Oral QHS  . dorzolamide-timolol  1 drop Both Eyes Q12H  . doxazosin  2 mg Oral Daily  . furosemide  60 mg Intravenous Once  . furosemide  80 mg Intravenous BID  . hydrALAZINE  100 mg Oral Q8H  . insulin aspart  2-12 Units Subcutaneous QID  . isosorbide dinitrate  20 mg Oral TID  . latanoprost  1 drop Right Eye QHS  . levothyroxine  100 mcg Oral QAC breakfast  . mirtazapine  15 mg Oral QHS  . pantoprazole  20 mg Oral Q0600  . polyvinyl alcohol  1 drop Left Eye QID  . prednisoLONE acetate  1 drop Left Eye QID  . sertraline  50 mg Oral Daily  . sodium chloride flush  3 mL Intravenous Q12H  . [START ON 03/13/2017] Vitamin D3  50,000 Units Oral Q30 days   Continuous Infusions:  PRN Meds: acetaminophen, ipratropium-albuterol, loperamide, ondansetron   Vital Signs    Vitals:   02/14/17 0708 02/14/17 1015 02/14/17 2201 02/15/17 0559  BP: (!) 156/66  (!) 147/55 (!) 152/61  Pulse: 76 81 65 70  Resp: 18  18 18   Temp: 98.4 F (36.9 C)  98.7 F (37.1 C) 98.6 F (37 C)  TempSrc: Oral  Oral Oral  SpO2: 92%  95% 95%  Weight: 147 lb 14.9 oz (67.1 kg)   147 lb 4.3 oz (66.8 kg)  Height:        Intake/Output Summary (Last 24 hours) at 02/15/17 0845 Last data filed at 02/14/17 1847  Gross per 24 hour  Intake              540 ml  Output                0 ml  Net              540 ml   Filed Weights   02/13/17 1730 02/14/17 0708 02/15/17 0559  Weight: 148 lb 5.9 oz (67.3 kg) 147 lb 14.9 oz (67.1 kg) 147 lb 4.3 oz (66.8 kg)     Physical Exam   General: Well  developed, well nourished, female appearing in no acute distress. Head: Normocephalic, atraumatic.  Neck: Supple without bruits, no JVD. Lungs:  Crackles 1/2 way of lung fields Heart: RRR, S1, S2, no S3, S4, or murmur; no rub. Abdomen: Soft, non-tender, non-distended with normoactive bowel sounds. No hepatomegaly. No rebound/guarding. No obvious abdominal masses. Extremities: No clubbing, cyanosis, no edema. Distal pedal pulses are 2+ bilaterally. Neuro: Alert and oriented X 3. Moves all extremities spontaneously. Psych: Normal affect.  Labs    Chemistry Recent Labs Lab 02/13/17 1416 02/14/17 0547  NA 139 141  K 4.1 3.8  CL 106 103  CO2 24 26  GLUCOSE 193* 137*  BUN 72* 71*  CREATININE 4.67* 4.68*  CALCIUM 8.6* 8.6*  PROT 7.1  --   ALBUMIN 3.5  --   AST 17  --   ALT 11*  --   ALKPHOS 71  --  BILITOT 0.7  --   GFRNONAA 9* 8*  GFRAA 10* 10*  ANIONGAP 9 12     Hematology Recent Labs Lab 02/13/17 1416 02/14/17 0547  WBC 9.0 8.5  RBC 2.82* 2.57*  HGB 8.3* 7.4*  HCT 26.2* 23.7*  MCV 92.9 92.2  MCH 29.4 28.8  MCHC 31.7 31.2  RDW 14.7 14.7  PLT 270 244    Cardiac Enzymes Recent Labs Lab 02/13/17 1741 02/13/17 1939 02/13/17 2259 02/14/17 0547  TROPONINI 0.20* 0.22* 0.25* 0.15*   No results for input(s): TROPIPOC in the last 168 hours.   BNP Recent Labs Lab 02/13/17 1416  BNP 3,008.9*     DDimer No results for input(s): DDIMER in the last 168 hours.   Radiology    Dg Chest Portable 1 View  Result Date: 02/13/2017 CLINICAL DATA:  Shortness of breath. EXAM: PORTABLE CHEST 1 VIEW COMPARISON:  Radiograph of August 24, 2016. FINDINGS: Stable cardiomegaly . Atherosclerosis of thoracic aorta is noted. No pneumothorax is noted. Bilateral perihilar and basilar densities are noted most consistent with pulmonary edema with associated pleural effusions. Bony thorax is unremarkable. Status post coronary artery bypass graft IMPRESSION: Aortic atherosclerosis.  Moderate bilateral perihilar and basilar lung opacities are noted concerning for edema with associated pleural effusions. These findings are concerning for congestive heart failure. Electronically Signed   By: Lupita Raider, M.D.   On: 02/13/2017 14:15     Telemetry    NSR in the 60s - Personally Reviewed  ECG    02/14/17: sinus rhythm with LVH - Personally Reviewed   Cardiac Studies   Echocardiogram 02/14/17: Study Conclusions - Left ventricle: The cavity size was normal. Posterior wall   thickness was increased in a pattern of mild LVH. Systolic   function was normal. The estimated ejection fraction was in the   range of 60% to 65%. Mild hypokinesis of the anteroseptal and   inferoseptal myocardium. - Aortic valve: Transvalvular velocity was within the normal range.   There was no stenosis. There was trivial regurgitation. - Mitral valve: Calcified annulus. Mobility of the posterior   leaflet was mildly restricted. Transvalvular velocity was within   the normal range. There was no evidence for stenosis. There was   moderate regurgitation. - Left atrium: The atrium was severely dilated. - Right ventricle: The cavity size was moderately dilated. Wall   thickness was normal. Systolic function was normal. - Right atrium: The atrium was severely dilated. - Tricuspid valve: There was moderate regurgitation. - Pulmonary arteries: Systolic pressure was severely increased. PA   peak pressure: 75 mm Hg (S). - Pericardium, extracardiac: There was a left pleural effusion.   Echocardiogram 08/18/16: Study Conclusions - Left ventricle: The cavity size was normal. There was mild concentric hypertrophy. Systolic function was normal. The estimated ejection fraction was in the range of 50% to 55%. Wall motion was normal; there were no regional wall motion abnormalities. - Aortic valve: Transvalvular velocity was within the normal range. There was no stenosis. There was no  regurgitation. 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 There was moderate regurgitation. - Left atrium: The atrium was severely dilated. - Right ventricle: The cavity size was normal. Wall thickness was normal. Systolic function was normal. - Atrial septum: No defect or patent foramen ovale was identified by color flow Doppler. - Tricuspid valve: There was moderate regurgitation. - Pulmonary arteries: Systolic pressure was severely increased. PA peak pressure: 83 mm Hg (S). - Pericardium, extracardiac: There was a  left pleural effusion. Ascites was noted.   Echocardiogram 01/21/16: Study Conclusions - Left ventricle: The cavity size was normal. Wall thickness was increased in a pattern of mild LVH. Systolic function was normal. The estimated ejection fraction was in the range of 60% to 65%. Wall motion was normal; there were no regional wall motion abnormalities. Features are consistent with a pseudonormal left ventricular filling pattern, with concomitant abnormal relaxation and increased filling pressure (grade 2 diastolic dysfunction). Doppler parameters are consistent with high ventricular filling pressure. - Mitral valve: There was mild regurgitation. - Left atrium: The atrium was severely dilated. - Pulmonary arteries: Systolic pressure was mildly increased.  Impressions: - Normal LV systolic function; grade 2 diastolic dysfunction with elevated LV filling pressure; severe LAE; mild MR and TR; mildly elevated pulonary pressure.  Patient Profile     71 y.o. 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 She presented with acute on chronic diastolic heart failure.  Assessment & Plan    1. Acute on chronic diastolic heart failure, pulmonary  hypertension - repeat echo with preserved systolic function and increased PA peak pressure - BNP this admission was 3008.9, but in the setting of CKD with sCr 4.68 - weight is 147 lbs, 148 on admission; dry weight appears to be 128 lbs (08/26/16) - need strict I&Os - will start 80 mg IV lasix BID, no K supplement at this time - daily BMP - troponin mildly elevated: 0.22 --> 0.25 --> 0.15; she denies chest pain and EKG without signs of ischemia. This is likely secondary to demand ischemia related to acute on chronic diastolic heart failure.   2. HTN - continue home medications norvasc, hydralazine, and imdur  3. HLD, CAD s/p CABG x 4 (2003) - continue ASA and plavix, continue statin  4. CKD stage IV - recently deemed not a dialysis candidate - holding ACEI/ARB and spironolactone - monitor urine output and sCr with diuresis - need strict I&Os - per nephrology/primary team  5. Chronic Anemia - Hb 7.4 yesterday from 8.3.CBC pending today - per primary team   Signed, Marcelino Duster , PA-C 8:45 AM 02/15/2017 Pager: [phone]  Patient seen and independently examined with Micah Flesher, PA. We discussed all aspects of the encounter. I agree with the assessment and plan as stated above with some modifications.  Patient now on IV lasix 80mg  BID but I&Os are incomplete.  Weight is down 1 lb. She remains volume overloaded on exam.   Lungs have crackles 1/2 way up, heart is RRR with no M/R/G and no edema on LE.   Need Strict I&Os.  Creatinine stable at 4.68.  Will increase Lasix to 80mg  TID.  BP is still elevated despite amlodipine 10mg  daily, doxazosin 2mg  daily, hydralazine 100mg  TID.  Will increase doxazosin to 4mg  daily.    Signed: Armanda Magic, MD [facility] 02/15/2017

## 2017-02-15 NOTE — Progress Notes (Signed)
PROGRESS NOTE [facility]   Taylor Padilla   ZOX:[id number] DOB: May 11, 1946  DOA: 02/13/2017 PCP: Georgann Housekeeper, MD   Brief Narrative:  Taylor Padilla is a 71 year old female with medical history of chronic diastolic CHF, hypertension, CAD status post MI, diabetes, legally blind, presented to the emergency department with low oxygen saturation from SNF. In the ED she was found to be hypoxic with O2 sats of 65 which improved after nasal cannula was placed also was noted to have hemoglobin of 8.3 and creatinine of 4.67 chest x-ray showed bilateral lung opacities consistent with congestive heart failure. She was admitted for acute CHF and diuresis and to monitor kidney function due to renal insufficiency  Subjective: Patient seen and examined, she can't tell if she is feeling better today. Per nursing staff patient continues to desat when she is off O2 supplement. Patient has no other complaints today.   Assessment & Plan: Acute respiratory failure with hypoxia secondary to acute on chronic diastolic heart failure. Clinical presentation consistent with fluid overload, patient with crackles, BMP over 3,000, chest x-ray consistent with CHF and shortness of breath Echo done showed normal LVEF with EF of 60-65% and mild LVH, severe pulmonary hypertension with the PASP 75 mgHg and moderate dilated RV Lasix increased to 80 mg TID per cards recommendations - Cr stable  Need strict I&O's, diuresis ok. Weight not accurate  Fluid restriction and low salt diet  Monitor blood pressure Not on ACE inhibitor or Aldactone due to CKD Holding beta blocker due to history of bradycardia   Elevated troponin Troponin flat trending down, no ischemic changes on EKG. This is likely demand ischemia from CHF. No further ischemic workup at this time   Essential hypertension above goal  Cardiology recommending to increase doxazosin to 4 mg Continue Norvasc 10 mg daily and hydralazine 100 mg TID   CKD stage  5  Creatinine stable, seemed to be at baseline Continue to monitor sCr closely   Anemia of chronic disease - unclear why Hgb continues to trend down  Initially felt to be due to hemodilution as patient received fluid and was overload, but hgb continues to trend down, current 6.8, will transfuse 1 unit of PRBC, If hemoglobin less than 7 will transfuse FOBT  Check CBC in AM   Type 2 diabetes mellitus - CBG stable Continue NovoLog Monitor CBGs  DVT prophylaxis: SCDs Code Status: Full code Family Communication: None at bedside Disposition Plan: SNF when medically stable  Consultants:   Cardiology  Procedures:   Echocardiogram ------------------------------------------------------------------- Study Conclusions  - Left ventricle: The cavity size was normal. Posterior wall   thickness was increased in a pattern of mild LVH. Systolic   function was normal. The estimated ejection fraction was in the   range of 60% to 65%. Mild hypokinesis of the anteroseptal and   inferoseptal myocardium. - Aortic valve: Transvalvular velocity was within the normal range.   There was no stenosis. There was trivial regurgitation. - Mitral valve: Calcified annulus. Mobility of the posterior   leaflet was mildly restricted. Transvalvular velocity was within   the normal range. There was no evidence for stenosis. There was   moderate regurgitation. - Left atrium: The atrium was severely dilated. - Right ventricle: The cavity size was moderately dilated. Wall   thickness was normal. Systolic function was normal. - Right atrium: The atrium was severely dilated. - Tricuspid valve: There was moderate regurgitation. - Pulmonary arteries: Systolic pressure was severely increased. PA   peak pressure: 75  mm Hg (S). - Pericardium, extracardiac: There was a left pleural effusion.  Antimicrobials: Anti-infectives    None       Objective: Vitals:   02/14/17 0708 02/14/17 1015 02/14/17 2201 02/15/17  0559  BP: (!) 156/66  (!) 147/55 (!) 152/61  Pulse: 76 81 65 70  Resp: 18  18 18   Temp: 98.4 F (36.9 C)  98.7 F (37.1 C) 98.6 F (37 C)  TempSrc: Oral  Oral Oral  SpO2: 92%  95% 95%  Weight: 67.1 kg (147 lb 14.9 oz)   66.8 kg (147 lb 4.3 oz)  Height:        Intake/Output Summary (Last 24 hours) at 02/15/17 1618 Last data filed at 02/15/17 1500  Gross per 24 hour  Intake              900 ml  Output                0 ml  Net              900 ml   Filed Weights   02/13/17 1730 02/14/17 0708 02/15/17 0559  Weight: 67.3 kg (148 lb 5.9 oz) 67.1 kg (147 lb 14.9 oz) 66.8 kg (147 lb 4.3 oz)    Examination: General: Pt is alert, awake, not in acute distress Cardiovascular: RRR, S1/S2 +, no rubs, no gallops Respiratory: Decrease air entry bilaterally, b/l crackles more prominent on the R up the 1/2 lung Abdominal: Soft, NT, ND, bowel sounds + Extremities: no edema, no cyanosis  Data Reviewed: I have personally reviewed following labs and imaging studies  CBC:  Recent Labs Lab 02/13/17 1416 02/14/17 0547 02/15/17 1031  WBC 9.0 8.5 8.0  NEUTROABS 8.4*  --   --   HGB 8.3* 7.4* 6.8*  HCT 26.2* 23.7* 21.3*  MCV 92.9 92.2 93.4  PLT 270 244 235   Basic Metabolic Panel:  Recent Labs Lab 02/13/17 1416 02/14/17 0547 02/15/17 1031  NA 139 141 139  K 4.1 3.8 3.7  CL 106 103 102  CO2 24 26 25   GLUCOSE 193* 137* 209*  BUN 72* 71* 74*  CREATININE 4.67* 4.68* 4.55*  CALCIUM 8.6* 8.6* 8.2*   GFR: Estimated Creatinine Clearance: 9.8 mL/min (A) (by C-G formula based on SCr of 4.55 mg/dL (H)). Liver Function Tests:  Recent Labs Lab 02/13/17 1416  AST 17  ALT 11*  ALKPHOS 71  BILITOT 0.7  PROT 7.1  ALBUMIN 3.5   No results for input(s): LIPASE, AMYLASE in the last 168 hours. No results for input(s): AMMONIA in the last 168 hours. Coagulation Profile: No results for input(s): INR, PROTIME in the last 168 hours. Cardiac Enzymes:  Recent Labs Lab 02/13/17 1416  02/13/17 1741 02/13/17 1939 02/13/17 2259 02/14/17 0547  TROPONINI 0.14* 0.20* 0.22* 0.25* 0.15*   BNP (last 3 results) No results for input(s): PROBNP in the last 8760 hours. HbA1C: No results for input(s): HGBA1C in the last 72 hours. CBG:  Recent Labs Lab 02/14/17 1604 02/14/17 1704 02/14/17 2159 02/15/17 0744 02/15/17 1146  GLUCAP 140* 139* 128* 112* 183*   Lipid Profile: No results for input(s): CHOL, HDL, LDLCALC, TRIG, CHOLHDL, LDLDIRECT in the last 72 hours. Thyroid Function Tests:  Recent Labs  02/13/17 1741  TSH 1.984   Anemia Panel:  Recent Labs  02/15/17 1031  VITAMINB12 200  FOLATE 16.0  FERRITIN 67  TIBC 224*  IRON 29  RETICCTPCT 3.2*   Sepsis Labs: No results for input(s): PROCALCITON,  LATICACIDVEN in the last 168 hours.  Recent Results (from the past 240 hour(s))  MRSA PCR Screening     Status: Abnormal   Collection Time: 02/13/17  6:11 PM  Result Value Ref Range Status   MRSA by PCR POSITIVE (A) NEGATIVE Final    Comment:        The GeneXpert MRSA Assay (FDA approved for NASAL specimens only), is one component of a comprehensive MRSA colonization surveillance program. It is not intended to diagnose MRSA infection nor to guide or monitor treatment for MRSA infections. RESULT CALLED TO, READ BACK BY AND VERIFIED WITHLetitia Libra RN 2224 02/13/17 A [facility]          Radiology Studies: No results found.    Scheduled Meds: . acidophilus  1 capsule Oral Daily  . amLODipine  10 mg Oral Daily  . atorvastatin  80 mg Oral QHS  . chlorhexidine  15 mL Mouth/Throat BID  . clopidogrel  75 mg Oral Daily  . donepezil  5 mg Oral QHS  . dorzolamide-timolol  1 drop Both Eyes Q12H  . doxazosin  4 mg Oral Daily  . furosemide  60 mg Intravenous Once  . furosemide  80 mg Intravenous Q8H  . hydrALAZINE  100 mg Oral Q8H  . insulin aspart  2-12 Units Subcutaneous QID  . isosorbide dinitrate  20 mg Oral TID  . latanoprost  1 drop Right  Eye QHS  . levothyroxine  100 mcg Oral QAC breakfast  . mirtazapine  15 mg Oral QHS  . pantoprazole  20 mg Oral Q0600  . polyvinyl alcohol  1 drop Left Eye QID  . prednisoLONE acetate  1 drop Left Eye QID  . sertraline  50 mg Oral Daily  . sodium chloride flush  3 mL Intravenous Q12H  . [START ON 03/13/2017] Vitamin D3  50,000 Units Oral Q30 days   Continuous Infusions:   LOS: 2 days    Time spent: Total of 25 minutes spent with pt, greater than 50% of which was spent in discussion of  treatment, counseling and coordination of care   Latrelle Dodrill, MD Pager: Text Page via [email or web address]  [phone]  If 7PM-7AM, please contact night-coverage [email or web address] Password [facility] 02/15/2017, 4:18 PM

## 2017-02-15 NOTE — NC FL2 (Signed)
[place] MEDICAID FL2 LEVEL OF CARE SCREENING TOOL     IDENTIFICATION  Patient Name: Taylor Padilla Birthdate: 1946-02-01 Sex: female Admission Date (Current Location): 02/13/2017  [facility] [place]   Facility and Address:  [facility],  [address], [place] [id number]      Provider Number: [id number]  Attending Physician Name and Address:  Randel PiggSilva Zapata, Dorma RussellEdwin, MD  Relative Name and Phone Number:       Current Level of Care: Hospital Recommended Level of Care: Skilled Nursing Facility Prior Approval Number:    Date Approved/Denied:   PASRR Number: [id number]  Discharge Plan: SNF    Current Diagnoses: Patient Active Problem List   Diagnosis Date Noted  . CKD stage 5 due to type 2 diabetes mellitus (HCC) 02/13/2017  . Acute on chronic respiratory failure with hypoxia (HCC) 02/13/2017  . Anemia of chronic kidney failure, stage 5 (HCC) 02/13/2017  . GERD without esophagitis 02/13/2017  . Troponin level elevated 02/13/2017  . Acute on chronic diastolic congestive heart failure (HCC)   . Staphylococcus aureus bacteremia 11/23/2014  . Coronary artery disease involving native coronary artery of native heart without angina pectoris   . Essential hypertension   . Hypothyroidism, acquired 10/07/2014  . Dyslipidemia 10/07/2014  . Type 2 diabetes mellitus with diabetic nephropathy, with long-term current use of insulin (HCC)     Orientation RESPIRATION BLADDER Height & Weight     Self, Time, Situation, Place  O2 (4L) Incontinent Weight: 147 lb 4.3 oz (66.8 kg) Height:  5\' 1"  (154.9 cm)  BEHAVIORAL SYMPTOMS/MOOD NEUROLOGICAL BOWEL NUTRITION STATUS      Continent Diet (regular diet, fluid consistency thin)  AMBULATORY STATUS COMMUNICATION OF NEEDS Skin   Supervision Verbally Normal                       Personal Care Assistance Level of Assistance  Bathing, Feeding, Dressing Bathing Assistance: Limited assistance Feeding  assistance: Independent Dressing Assistance: Limited assistance     Functional Limitations Info  Sight, Hearing, Speech Sight Info: Impaired Hearing Info: Adequate Speech Info: Adequate    SPECIAL CARE FACTORS FREQUENCY                       Contractures Contractures Info: Not present    Additional Factors Info  Code Status, Isolation Precautions, Allergies Code Status Info: Full Allergies Info: epinephrine     Isolation Precautions Info: contact precautions     Current Medications (02/15/2017):  This is the current hospital active medication list Current Facility-Administered Medications  Medication Dose Route Frequency Provider Last Rate Last Dose  . acetaminophen (TYLENOL) tablet 500 mg  500 mg Oral Q4H PRN Alison Murrayevine, Alma M, MD      . acidophilus (RISAQUAD) capsule 1 capsule  1 capsule Oral Daily Alison Murrayevine, Alma M, MD   1 capsule at 02/14/17 1007  . amLODipine (NORVASC) tablet 10 mg  10 mg Oral Daily Alison Murrayevine, Alma M, MD   10 mg at 02/14/17 1009  . atorvastatin (LIPITOR) tablet 80 mg  80 mg Oral QHS Alison Murrayevine, Alma M, MD   80 mg at 02/14/17 2131  . chlorhexidine (PERIDEX) 0.12 % solution 15 mL  15 mL Mouth/Throat BID Alison Murrayevine, Alma M, MD   15 mL at 02/14/17 2130  . clopidogrel (PLAVIX) tablet 75 mg  75 mg Oral Daily Alison Murrayevine, Alma M, MD   75 mg at 02/14/17 1008  . donepezil (ARICEPT) tablet 5  mg  5 mg Oral QHS Alison Murray, MD   5 mg at 02/14/17 2132  . dorzolamide-timolol (COSOPT) 22.3-6.8 MG/ML ophthalmic solution 1 drop  1 drop Both Eyes Q12H Alison Murray, MD   1 drop at 02/14/17 2131  . doxazosin (CARDURA) tablet 4 mg  4 mg Oral Daily Turner, Traci R, MD      . ferumoxytol [facility]) 510 mg in sodium chloride 0.9 % 100 mL IVPB  510 mg Intravenous Once Camille Bal, MD      . furosemide (LASIX) injection 60 mg  60 mg Intravenous Once Randel Pigg, Dorma Russell, MD      . furosemide (LASIX) injection 80 mg  80 mg Intravenous Q8H Turner, Traci R, MD      . hydrALAZINE (APRESOLINE)  tablet 100 mg  100 mg Oral Q8H Alison Murray, MD   100 mg at 02/15/17 0557  . insulin aspart (novoLOG) injection 2-12 Units  2-12 Units Subcutaneous QID Alison Murray, MD      . ipratropium-albuterol (DUONEB) 0.5-2.5 (3) MG/3ML nebulizer solution 3 mL  3 mL Nebulization Q4H PRN Alison Murray, MD      . isosorbide dinitrate (ISORDIL) tablet 20 mg  20 mg Oral TID Alison Murray, MD   20 mg at 02/14/17 1638  . latanoprost (XALATAN) 0.005 % ophthalmic solution 1 drop  1 drop Right Eye QHS Alison Murray, MD   1 drop at 02/14/17 2132  . levothyroxine (SYNTHROID, LEVOTHROID) tablet 100 mcg  100 mcg Oral QAC breakfast Alison Murray, MD   100 mcg at 02/14/17 1008  . loperamide (IMODIUM) capsule 2 mg  2 mg Oral Q3H PRN Alison Murray, MD      . mirtazapine (REMERON) tablet 15 mg  15 mg Oral QHS Alison Murray, MD   15 mg at 02/14/17 2131  . ondansetron (ZOFRAN) tablet 4 mg  4 mg Oral Q6H PRN Alison Murray, MD      . pantoprazole (PROTONIX) EC tablet 20 mg  20 mg Oral Q0600 Alison Murray, MD   20 mg at 02/15/17 0557  . polyvinyl alcohol (LIQUIFILM TEARS) 1.4 % ophthalmic solution 1 drop  1 drop Left Eye QID Alison Murray, MD   1 drop at 02/14/17 2131  . prednisoLONE acetate (PRED FORTE) 1 % ophthalmic suspension 1 drop  1 drop Left Eye QID Alison Murray, MD   1 drop at 02/14/17 2131  . sertraline (ZOLOFT) tablet 50 mg  50 mg Oral Daily Alison Murray, MD   50 mg at 02/14/17 1009  . sodium chloride flush (NS) 0.9 % injection 3 mL  3 mL Intravenous Q12H Alison Murray, MD   3 mL at 02/14/17 2132  . [START ON 03/13/2017] Vitamin D3 CAPS 50,000 Units  50,000 Units Oral Q30 days Alison Murray, MD         Discharge Medications: Please see discharge summary for a list of discharge medications.  Relevant Imaging Results:  Relevant Lab Results:   Additional Information SS#: [id number]  Nelwyn Salisbury, LCSW

## 2017-02-16 ENCOUNTER — Inpatient Hospital Stay (HOSPITAL_COMMUNITY): Payer: Medicare Other

## 2017-02-16 DIAGNOSIS — R748 Abnormal levels of other serum enzymes: Secondary | ICD-10-CM

## 2017-02-16 LAB — GLUCOSE, CAPILLARY
GLUCOSE-CAPILLARY: 99 mg/dL (ref 65–99)
GLUCOSE-CAPILLARY: 153 mg/dL — AB (ref 65–99)
Glucose-Capillary: 115 mg/dL — ABNORMAL HIGH (ref 65–99)
Glucose-Capillary: 164 mg/dL — ABNORMAL HIGH (ref 65–99)

## 2017-02-16 LAB — BASIC METABOLIC PANEL
ANION GAP: 12 (ref 5–15)
BUN: 68 mg/dL — AB (ref 6–20)
CO2: 24 mmol/L (ref 22–32)
Calcium: 8.3 mg/dL — ABNORMAL LOW (ref 8.9–10.3)
Chloride: 102 mmol/L (ref 101–111)
Creatinine, Ser: 4.15 mg/dL — ABNORMAL HIGH (ref 0.44–1.00)
GFR calc Af Amer: 11 mL/min — ABNORMAL LOW (ref >60)
GFR calc non Af Amer: 10 mL/min — ABNORMAL LOW (ref >60)
GLUCOSE: 126 mg/dL — AB (ref 65–99)
Potassium: 3.5 mmol/L (ref 3.5–5.1)
Sodium: 138 mmol/L (ref 135–145)

## 2017-02-16 LAB — CBC
HCT: 26.3 % — ABNORMAL LOW (ref 36.0–46.0)
HEMOGLOBIN: 8.8 g/dL — AB (ref 12.0–15.0)
MCH: 30 pg (ref 26.0–34.0)
MCHC: 33.5 g/dL (ref 30.0–36.0)
MCV: 89.8 fL (ref 78.0–100.0)
Platelets: 228 10*3/uL (ref 150–400)
RBC: 2.93 MIL/uL — ABNORMAL LOW (ref 3.87–5.11)
RDW: 14.8 % (ref 11.5–15.5)
WBC: 6.3 10*3/uL (ref 4.0–10.5)

## 2017-02-16 MED ORDER — DARBEPOETIN ALFA 25 MCG/0.42ML IJ SOSY
25.0000 ug | PREFILLED_SYRINGE | Freq: Once | INTRAMUSCULAR | Status: AC
  Administered 2017-02-16: 25 ug via SUBCUTANEOUS
  Filled 2017-02-16: qty 0.42

## 2017-02-16 NOTE — Progress Notes (Signed)
Progress Note  Patient Name: Taylor Padilla Date of Encounter: 02/16/2017  Primary Cardiologist: Dr. Gala Romney  Subjective   SOB has improved.  Denies chest pain   Inpatient Medications    Scheduled Meds: . acidophilus  1 capsule Oral Daily  . amLODipine  10 mg Oral Daily  . atorvastatin  80 mg Oral QHS  . chlorhexidine  15 mL Mouth/Throat BID  . clopidogrel  75 mg Oral Daily  . donepezil  5 mg Oral QHS  . dorzolamide-timolol  1 drop Both Eyes Q12H  . doxazosin  4 mg Oral Daily  . furosemide  60 mg Intravenous Once  . furosemide  80 mg Intravenous Q8H  . hydrALAZINE  100 mg Oral Q8H  . insulin aspart  2-12 Units Subcutaneous QID  . isosorbide dinitrate  20 mg Oral TID  . latanoprost  1 drop Right Eye QHS  . levothyroxine  100 mcg Oral QAC breakfast  . mirtazapine  15 mg Oral QHS  . pantoprazole  20 mg Oral Q0600  . polyvinyl alcohol  1 drop Left Eye QID  . prednisoLONE acetate  1 drop Left Eye QID  . sertraline  50 mg Oral Daily  . sodium chloride flush  3 mL Intravenous Q12H  . [START ON 03/13/2017] Vitamin D3  50,000 Units Oral Q30 days   Continuous Infusions:  PRN Meds: acetaminophen, ipratropium-albuterol, loperamide, ondansetron   Vital Signs    Vitals:   02/16/17 0016 02/16/17 0045 02/16/17 0243 02/16/17 0511  BP: (!) 133/44 (!) 135/45 (!) 153/52 (!) 154/52  Pulse: 61 60 63 65  Resp: 18 16 18 18   Temp: 98.9 F (37.2 C) 99 F (37.2 C) 98.5 F (36.9 C) 98.8 F (37.1 C)  TempSrc: Oral Oral Oral Oral  SpO2: 95% 91% 93% 94%  Weight:    154 lb 1.6 oz (69.9 kg)  Height:        Intake/Output Summary (Last 24 hours) at 02/16/17 0747 Last data filed at 02/16/17 0243  Gross per 24 hour  Intake              565 ml  Output                0 ml  Net              565 ml   Filed Weights   02/14/17 0708 02/15/17 0559 02/16/17 0511  Weight: 147 lb 14.9 oz (67.1 kg) 147 lb 4.3 oz (66.8 kg) 154 lb 1.6 oz (69.9 kg)     Physical Exam   General: WD, WN in  NAD Head: normal  Neck: no JVD or bruit Lungs:  Crackles at bases Heart: RRR with no M/R/G Abdomen: soft, NT, ND with active BS Extremities: no edema Neuro: nonfocal Psych: normal affect  Labs    Chemistry  Recent Labs Lab 02/13/17 1416 02/14/17 0547 02/15/17 1031  NA 139 141 139  K 4.1 3.8 3.7  CL 106 103 102  CO2 24 26 25   GLUCOSE 193* 137* 209*  BUN 72* 71* 74*  CREATININE 4.67* 4.68* 4.55*  CALCIUM 8.6* 8.6* 8.2*  PROT 7.1  --   --   ALBUMIN 3.5  --   --   AST 17  --   --   ALT 11*  --   --   ALKPHOS 71  --   --   BILITOT 0.7  --   --   GFRNONAA 9* 8* 9*  GFRAA 10* 10* 10*  ANIONGAP 9 12 12      Hematology  Recent Labs Lab 02/13/17 1416 02/14/17 0547 02/15/17 1031  WBC 9.0 8.5 8.0  RBC 2.82* 2.57* 2.28*  2.28*  HGB 8.3* 7.4* 6.8*  HCT 26.2* 23.7* 21.3*  MCV 92.9 92.2 93.4  MCH 29.4 28.8 29.8  MCHC 31.7 31.2 31.9  RDW 14.7 14.7 15.1  PLT 270 244 235    Cardiac Enzymes  Recent Labs Lab 02/13/17 1741 02/13/17 1939 02/13/17 2259 02/14/17 0547  TROPONINI 0.20* 0.22* 0.25* 0.15*   No results for input(s): TROPIPOC in the last 168 hours.   BNP  Recent Labs Lab 02/13/17 1416  BNP 3,008.9*     DDimer No results for input(s): DDIMER in the last 168 hours.   Radiology    No results found.   Telemetry    NSR- Personally Reviewed  ECG    No new EKG to review- Personally Reviewed   Cardiac Studies   Echocardiogram 02/14/17: Study Conclusions - Left ventricle: The cavity size was normal. Posterior wall   thickness was increased in a pattern of mild LVH. Systolic   function was normal. The estimated ejection fraction was in the   range of 60% to 65%. Mild hypokinesis of the anteroseptal and   inferoseptal myocardium. - Aortic valve: Transvalvular velocity was within the normal range.   There was no stenosis. There was trivial regurgitation. - Mitral valve: Calcified annulus. Mobility of the posterior   leaflet was mildly  restricted. Transvalvular velocity was within   the normal range. There was no evidence for stenosis. There was   moderate regurgitation. - Left atrium: The atrium was severely dilated. - Right ventricle: The cavity size was moderately dilated. Wall   thickness was normal. Systolic function was normal. - Right atrium: The atrium was severely dilated. - Tricuspid valve: There was moderate regurgitation. - Pulmonary arteries: Systolic pressure was severely increased. PA   peak pressure: 75 mm Hg (S). - Pericardium, extracardiac: There was a left pleural effusion.   Echocardiogram 08/18/16: Study Conclusions - Left ventricle: The cavity size was normal. There was mild concentric hypertrophy. Systolic function was normal. The estimated ejection fraction was in the range of 50% to 55%. Wall motion was normal; there were no regional wall motion abnormalities. - Aortic valve: Transvalvular velocity was within the normal range. There was no stenosis. There was no regurgitation. 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 There was moderate regurgitation. - Left atrium: The atrium was severely dilated. - Right ventricle: The cavity size was normal. Wall thickness was normal. Systolic function was normal. - Atrial septum: No defect or patent foramen ovale was identified by color flow Doppler. - Tricuspid valve: There was moderate regurgitation. - Pulmonary arteries: Systolic pressure was severely increased. PA peak pressure: 83 mm Hg (S). - Pericardium, extracardiac: There was a left pleural effusion. Ascites was noted.   Echocardiogram 01/21/16: Study Conclusions - Left ventricle: The cavity size was normal. Wall thickness was increased in a pattern of mild LVH. Systolic function was normal. The estimated ejection fraction was in the range of 60% to 65%. Wall motion was normal; there were no regional wall motion abnormalities.  Features are consistent with a pseudonormal left ventricular filling pattern, with concomitant abnormal relaxation and increased filling pressure (grade 2 diastolic dysfunction). Doppler parameters are consistent with high ventricular filling pressure. - Mitral valve: There was mild regurgitation. - Left atrium: The atrium was severely dilated. - Pulmonary arteries: Systolic pressure was mildly  increased.  Impressions: - Normal LV systolic function; grade 2 diastolic dysfunction with elevated LV filling pressure; severe LAE; mild MR and TR; mildly elevated pulonary pressure.  Patient Profile     71 y.o. 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 She presented with acute on chronic diastolic heart failure.  Assessment & Plan    1. Acute on chronic diastolic heart failure with severe pulmonary hypertension - repeat echo with preserved systolic function and increased PA peak pressure at 75mmHg - BNP this admission was 3008.9, but in the setting of CKD with sCr 4.68 - weight has increased 7lbs overnight ? Accuracy.  148 on admission and now 154lbs. Dry weight appears to be 128 lbs (08/26/16) - need strict I&Os- still incomplete - continue Lasix to 80mg  IV TID  - her CKD is likely playing a significant role and probably will require higher doses of diuretics. - recommend renal consult to help in guiding diuretic therapy.  2.  Elevated troponin: 0.22 --> 0.25 --> 0.15; she denies chest pain and EKG without signs of ischemia.  -This is likely secondary to demand ischemia related to acute on chronic diastolic heart failure.   3. HTN - borderline controlled - continue home medications norvasc, hydralazine, and imdur  4. CAD s/p CABG x 4 (2003) - no anginal symptoms - continue ASA and plavix,  continue statin  5. CKD stage IV - recently deemed not a dialysis candidate - holding ACEI/ARB and spironolactone - monitor urine output and sCr with diuresis - need strict I&Os - per nephrology/primary team  6. Chronic Anemia - Hb down to 6.8 yesterday from 8.3.CBC pending today - this is likely exacerbating her CHF - per primary team   Signed, Armanda Magicraci Larkin Morelos , PA-C 7:47 AM 02/16/2017 Pager: [phone]

## 2017-02-16 NOTE — Progress Notes (Signed)
PROGRESS NOTE [facility]   [name]   HQI:[id number] DOB: 1946-02-17  DOA: 02/13/2017 PCP: Georgann Housekeeper, MD   Brief Narrative:  Taylor Padilla is a 71 year old female with medical history of chronic diastolic CHF, hypertension, CAD status post MI, diabetes, legally blind, presented to the emergency department with low oxygen saturation from SNF. In the ED she was found to be hypoxic with O2 sats of 65 which improved after nasal cannula was placed also was noted to have hemoglobin of 8.3 and creatinine of 4.67 chest x-ray showed bilateral lung opacities consistent with congestive heart failure. She was admitted for acute CHF and diuresis and to monitor kidney function due to renal insufficiency  Subjective: Patient seen and examined, she report she is doing much better, her breathing has improved. ? Weight is up overnight 6 lb.   Assessment & Plan: Acute respiratory failure with hypoxia secondary to acute on chronic diastolic heart failure. Clinical presentation consistent with fluid overload, patient with crackles, BMP over 3,000, chest x-ray consistent with CHF and shortness of breath Echo done showed normal LVEF with EF of 60-65% and mild LVH, severe pulmonary hypertension with the PASP 75 mgHg and moderate dilated RV Continue IV diuresis  - Cr improving  Will repeat CXR  Need to document I&Os - patient was incontinent a female external cath has been placed Fluid restriction and low salt diet  Monitor blood pressure Not on ACE inhibitor or Aldactone due to CKD Holding beta blocker due to history of bradycardia   Elevated troponin Troponin flat trending down, no ischemic changes on EKG. This is likely demand ischemia from CHF. No further ischemic workup at this time   Essential hypertension above goal  Cardiology recommending to increase doxazosin to 4 mg Continue Norvasc 10 mg daily and hydralazine 100 mg TID   CKD stage 5  Creatinine stable, seemed to be at  baseline Continue to monitor sCr closely   Anemia of chronic disease - unclear why Hgb continues to trend down  Initially felt to be due to hemodilution as patient received fluid and was overload contributing  S/p 1 unit PRBCs Current Hgb 8.8 If hemoglobin less than 7 will transfuse FOBT pending Anemia panel negative   Type 2 diabetes mellitus - CBG stable Continue NovoLog Monitor CBGs  DVT prophylaxis: SCDs Code Status: Full code Family Communication: None at bedside Disposition Plan: SNF when medically stable  Consultants:   Cardiology  Procedures:   Echocardiogram ------------------------------------------------------------------- Study Conclusions  - Left ventricle: The cavity size was normal. Posterior wall   thickness was increased in a pattern of mild LVH. Systolic   function was normal. The estimated ejection fraction was in the   range of 60% to 65%. Mild hypokinesis of the anteroseptal and   inferoseptal myocardium. - Aortic valve: Transvalvular velocity was within the normal range.   There was no stenosis. There was trivial regurgitation. - Mitral valve: Calcified annulus. Mobility of the posterior   leaflet was mildly restricted. Transvalvular velocity was within   the normal range. There was no evidence for stenosis. There was   moderate regurgitation. - Left atrium: The atrium was severely dilated. - Right ventricle: The cavity size was moderately dilated. Wall   thickness was normal. Systolic function was normal. - Right atrium: The atrium was severely dilated. - Tricuspid valve: There was moderate regurgitation. - Pulmonary arteries: Systolic pressure was severely increased. PA   peak pressure: 75 mm Hg (S). - Pericardium, extracardiac: There was a left pleural  effusion.  Antimicrobials: Anti-infectives    None       Objective: Vitals:   02/16/17 0045 02/16/17 0243 02/16/17 0511 02/16/17 1433  BP: (!) 135/45 (!) 153/52 (!) 154/52 (!) 156/50   Pulse: 60 63 65 69  Resp: 16 18 18 18   Temp: 99 F (37.2 C) 98.5 F (36.9 C) 98.8 F (37.1 C)   TempSrc: Oral Oral Oral   SpO2: 91% 93% 94% 95%  Weight:   69.9 kg (154 lb 1.6 oz)   Height:        Intake/Output Summary (Last 24 hours) at 02/16/17 1504 Last data filed at 02/16/17 1000  Gross per 24 hour  Intake              565 ml  Output                0 ml  Net              565 ml   Filed Weights   02/14/17 0708 02/15/17 0559 02/16/17 0511  Weight: 67.1 kg (147 lb 14.9 oz) 66.8 kg (147 lb 4.3 oz) 69.9 kg (154 lb 1.6 oz)    Examination:  General: Pt is alert, awake, not in acute distress, patient is blind  Cardiovascular: RRR, S1/S2 +, no rubs, no gallops Respiratory: Air entry improved, crackles decreasing now prominent in lower fields b/l  Abdominal: Soft, NT, ND, bowel sounds + Extremities: no LE edema, no cyanosis  Data Reviewed: I have personally reviewed following labs and imaging studies  CBC:  Recent Labs Lab 02/13/17 1416 02/14/17 0547 02/15/17 1031 02/16/17 0851  WBC 9.0 8.5 8.0 6.3  NEUTROABS 8.4*  --   --   --   HGB 8.3* 7.4* 6.8* 8.8*  HCT 26.2* 23.7* 21.3* 26.3*  MCV 92.9 92.2 93.4 89.8  PLT 270 244 235 228   Basic Metabolic Panel:  Recent Labs Lab 02/13/17 1416 02/14/17 0547 02/15/17 1031 02/16/17 0851  NA 139 141 139 138  K 4.1 3.8 3.7 3.5  CL 106 103 102 102  CO2 24 26 25 24   GLUCOSE 193* 137* 209* 126*  BUN 72* 71* 74* 68*  CREATININE 4.67* 4.68* 4.55* 4.15*  CALCIUM 8.6* 8.6* 8.2* 8.3*   GFR: Estimated Creatinine Clearance: 10.9 mL/min (A) (by C-G formula based on SCr of 4.15 mg/dL (H)). Liver Function Tests:  Recent Labs Lab 02/13/17 1416  AST 17  ALT 11*  ALKPHOS 71  BILITOT 0.7  PROT 7.1  ALBUMIN 3.5   Cardiac Enzymes:  Recent Labs Lab 02/13/17 1416 02/13/17 1741 02/13/17 1939 02/13/17 2259 02/14/17 0547  TROPONINI 0.14* 0.20* 0.22* 0.25* 0.15*   CBG:  Recent Labs Lab 02/15/17 1146 02/15/17 1745  02/15/17 2227 02/16/17 0758 02/16/17 1348  GLUCAP 183* 161* 169* 115* 164*   Thyroid Function Tests:  Recent Labs  02/13/17 1741  TSH 1.984   Anemia Panel:  Recent Labs  02/15/17 1031  VITAMINB12 200  FOLATE 16.0  FERRITIN 67  TIBC 224*  IRON 29  RETICCTPCT 3.2*   Sepsis Labs: No results for input(s): PROCALCITON, LATICACIDVEN in the last 168 hours.  Recent Results (from the past 240 hour(s))  MRSA PCR Screening     Status: Abnormal   Collection Time: 02/13/17  6:11 PM  Result Value Ref Range Status   MRSA by PCR POSITIVE (A) NEGATIVE Final    Comment:        The GeneXpert MRSA Assay (FDA approved for NASAL specimens only),  is one component of a comprehensive MRSA colonization surveillance program. It is not intended to diagnose MRSA infection nor to guide or monitor treatment for MRSA infections. RESULT CALLED TO, READ BACK BY AND VERIFIED WITHLetitia Libra RN 2224 02/13/17 A [facility]      Radiology Studies: No results found.  Scheduled Meds: . acidophilus  1 capsule Oral Daily  . amLODipine  10 mg Oral Daily  . atorvastatin  80 mg Oral QHS  . chlorhexidine  15 mL Mouth/Throat BID  . clopidogrel  75 mg Oral Daily  . darbepoetin (ARANESP) injection - NON-DIALYSIS  25 mcg Subcutaneous Once  . donepezil  5 mg Oral QHS  . dorzolamide-timolol  1 drop Both Eyes Q12H  . doxazosin  4 mg Oral Daily  . furosemide  60 mg Intravenous Once  . furosemide  80 mg Intravenous Q8H  . hydrALAZINE  100 mg Oral Q8H  . insulin aspart  2-12 Units Subcutaneous QID  . isosorbide dinitrate  20 mg Oral TID  . latanoprost  1 drop Right Eye QHS  . levothyroxine  100 mcg Oral QAC breakfast  . mirtazapine  15 mg Oral QHS  . pantoprazole  20 mg Oral Q0600  . polyvinyl alcohol  1 drop Left Eye QID  . prednisoLONE acetate  1 drop Left Eye QID  . sertraline  50 mg Oral Daily  . sodium chloride flush  3 mL Intravenous Q12H  . [START ON 03/13/2017] Vitamin D3  50,000 Units Oral  Q30 days   Continuous Infusions:   LOS: 3 days    Time spent: Total of 25 minutes spent with pt, greater than 50% of which was spent in discussion of  treatment, counseling and coordination of care   Latrelle Dodrill, MD Pager: Text Page via [email or web address]  [phone]  If 7PM-7AM, please contact night-coverage [email or web address] Password [email or web address] 02/16/2017, 3:04 PM

## 2017-02-16 NOTE — Progress Notes (Signed)
CSW following to assist with patient discharge needs to SNF at discharge.  Please consult CSW if needs arrive.

## 2017-02-17 DIAGNOSIS — I509 Heart failure, unspecified: Secondary | ICD-10-CM

## 2017-02-17 LAB — GLUCOSE, CAPILLARY
GLUCOSE-CAPILLARY: 180 mg/dL — AB (ref 65–99)
Glucose-Capillary: 128 mg/dL — ABNORMAL HIGH (ref 65–99)
Glucose-Capillary: 189 mg/dL — ABNORMAL HIGH (ref 65–99)
Glucose-Capillary: 163 mg/dL — ABNORMAL HIGH (ref 65–99)

## 2017-02-17 LAB — BASIC METABOLIC PANEL
Anion gap: 10 (ref 5–15)
BUN: 70 mg/dL — ABNORMAL HIGH (ref 6–20)
CHLORIDE: 103 mmol/L (ref 101–111)
CO2: 27 mmol/L (ref 22–32)
CREATININE: 4.36 mg/dL — AB (ref 0.44–1.00)
Calcium: 8.4 mg/dL — ABNORMAL LOW (ref 8.9–10.3)
GFR calc non Af Amer: 9 mL/min — ABNORMAL LOW (ref >60)
GFR, EST AFRICAN AMERICAN: 11 mL/min — AB (ref >60)
Glucose, Bld: 131 mg/dL — ABNORMAL HIGH (ref 65–99)
POTASSIUM: 3.5 mmol/L (ref 3.5–5.1)
Sodium: 140 mmol/L (ref 135–145)

## 2017-02-17 LAB — BRAIN NATRIURETIC PEPTIDE: B NATRIURETIC PEPTIDE 5: 2549.3 pg/mL — AB (ref 0.0–100.0)

## 2017-02-17 MED ORDER — BUMETANIDE 1 MG PO TABS
3.0000 mg | ORAL_TABLET | Freq: Two times a day (BID) | ORAL | Status: DC
  Administered 2017-02-17 – 2017-02-18 (×3): 3 mg via ORAL
  Filled 2017-02-17 (×4): qty 3

## 2017-02-17 NOTE — Progress Notes (Addendum)
Pt remains noncompliant to IV sticks, pt is appropriate at times then pt become irradiac and irrational, is very repetitive and with ST memory. Pure wick drainin place, has had several episode of inct. SRP,RN

## 2017-02-17 NOTE — Progress Notes (Signed)
Pt refused IV sticks, via IV team. Please adjust pt IV meds. SRP, RN

## 2017-02-17 NOTE — Progress Notes (Signed)
PROGRESS NOTE [facility]   Taylor Padilla   ZOX:[id number] DOB: 05/14/1946  DOA: 02/13/2017 PCP: Georgann HousekeeperHusain, Karrar, MD   Brief Narrative:  Taylor Padilla is a 71 year old female with medical history of chronic diastolic CHF, hypertension, CAD status post MI, diabetes, legally blind, presented to the emergency department with low oxygen saturation from SNF. In the ED she was found to be hypoxic with O2 sats of 65 which improved after nasal cannula was placed also was noted to have hemoglobin of 8.3 and creatinine of 4.67 chest x-ray showed bilateral lung opacities consistent with congestive heart failure. She was admitted for acute CHF and diuresis and to monitor kidney function due to renal insufficiency  Subjective: Patient seen and examined, she report her breathing continues to improve. Refusing finger sticks, lab workup and IV lines. No other complaints, she wants to go home.   Assessment & Plan: Acute respiratory failure with hypoxia secondary to acute on chronic diastolic heart failure. Initial presentation consistent with fluid overload, patient with crackles, BMP over 3,000, chest x-ray consistent with CHF and shortness of breath Echo done showed normal LVEF with EF of 60-65% and mild LVH, severe pulmonary hypertension with the PASP 75 mgHg and moderate dilated RV (indicating diastolic dfx)  Diurese well, with Lasix 80 mg IV, but patient now refusing IV lines will switch to Bumex 3 mg BID  Repeat CXR shows improvement - will repeat another in AM  I&O's continue to be inaccurate, BNP was ordered which showed improvement  Fluid restriction and low salt diet  Monitor blood pressure Not on ACE inhibitor or Aldactone due to CKD No beta blocker due to history of bradycardia   Elevated troponin Troponin flat trending down, no ischemic changes on EKG. This is likely demand ischemia from CHF. No further ischemic workup at this time   Essential hypertension above goal  Cardiology  recommending to increase doxazosin to 4 mg Continue Norvasc 10 mg daily and hydralazine 100 mg TID   CKD stage 5  Cr seems to be stable, initially improved with diuresis, today slight up  Monitor as patient is been switched to Bumex Patient is not candidate for HD   Anemia of chronic disease  Initially felt to be due to hemodilution as patient received fluid and was overload contributing  S/p 1 unit PRBCs If hemoglobin less than 7 will transfuse Anemia panel negative   Type 2 diabetes mellitus - CBG stable Continue NovoLog Monitor CBGs  DVT prophylaxis: SCDs Code Status: Full code Family Communication: None at bedside Disposition Plan: SNF when medically stable  Consultants:   Cardiology  Procedures:   Echocardiogram ------------------------------------------------------------------- Study Conclusions  - Left ventricle: The cavity size was normal. Posterior wall   thickness was increased in a pattern of mild LVH. Systolic   function was normal. The estimated ejection fraction was in the   range of 60% to 65%. Mild hypokinesis of the anteroseptal and   inferoseptal myocardium. - Aortic valve: Transvalvular velocity was within the normal range.   There was no stenosis. There was trivial regurgitation. - Mitral valve: Calcified annulus. Mobility of the posterior   leaflet was mildly restricted. Transvalvular velocity was within   the normal range. There was no evidence for stenosis. There was   moderate regurgitation. - Left atrium: The atrium was severely dilated. - Right ventricle: The cavity size was moderately dilated. Wall   thickness was normal. Systolic function was normal. - Right atrium: The atrium was severely dilated. - Tricuspid valve: There  was moderate regurgitation. - Pulmonary arteries: Systolic pressure was severely increased. PA   peak pressure: 75 mm Hg (S). - Pericardium, extracardiac: There was a left pleural  effusion.  Antimicrobials: Anti-infectives    None       Objective: Vitals:   02/16/17 1433 02/16/17 2226 02/17/17 0500 02/17/17 0506  BP: (!) 156/50 (!) 150/70  (!) 148/64  Pulse: 69     Resp: 18     Temp:      TempSrc:      SpO2: 95%     Weight:   68.1 kg (150 lb 2.1 oz)   Height:        Intake/Output Summary (Last 24 hours) at 02/17/17 1334 Last data filed at 02/17/17 1328  Gross per 24 hour  Intake              820 ml  Output             1300 ml  Net             -480 ml   Filed Weights   02/15/17 0559 02/16/17 0511 02/17/17 0500  Weight: 66.8 kg (147 lb 4.3 oz) 69.9 kg (154 lb 1.6 oz) 68.1 kg (150 lb 2.1 oz)    Examination:  General: NAD, 2L [place], Blind  Cardiovascular: RRR, S1/S2 3/6 systolic murmur, no rubs, no gallops Respiratory: Good air entry, b/l crackles at the bases. No wheezing or rales Abdominal: Soft, NT, ND, bowel sounds + Extremities: no LE edema, no cyanosis  Data Reviewed: I have personally reviewed following labs and imaging studies  CBC:  Recent Labs Lab 02/13/17 1416 02/14/17 0547 02/15/17 1031 02/16/17 0851  WBC 9.0 8.5 8.0 6.3  NEUTROABS 8.4*  --   --   --   HGB 8.3* 7.4* 6.8* 8.8*  HCT 26.2* 23.7* 21.3* 26.3*  MCV 92.9 92.2 93.4 89.8  PLT 270 244 235 228   Basic Metabolic Panel:  Recent Labs Lab 02/13/17 1416 02/14/17 0547 02/15/17 1031 02/16/17 0851 02/17/17 0845  NA 139 141 139 138 140  K 4.1 3.8 3.7 3.5 3.5  CL 106 103 102 102 103  CO2 24 26 25 24 27   GLUCOSE 193* 137* 209* 126* 131*  BUN 72* 71* 74* 68* 70*  CREATININE 4.67* 4.68* 4.55* 4.15* 4.36*  CALCIUM 8.6* 8.6* 8.2* 8.3* 8.4*   GFR: Estimated Creatinine Clearance: 10.3 mL/min (A) (by C-G formula based on SCr of 4.36 mg/dL (H)). Liver Function Tests:  Recent Labs Lab 02/13/17 1416  AST 17  ALT 11*  ALKPHOS 71  BILITOT 0.7  PROT 7.1  ALBUMIN 3.5   Cardiac Enzymes:  Recent Labs Lab 02/13/17 1416 02/13/17 1741 02/13/17 1939 02/13/17 2259  02/14/17 0547  TROPONINI 0.14* 0.20* 0.22* 0.25* 0.15*   CBG:  Recent Labs Lab 02/16/17 0758 02/16/17 1348 02/16/17 1724 02/16/17 2213 02/17/17 1026  GLUCAP 115* 164* 99 153* 128*   Thyroid Function Tests: No results for input(s): TSH, T4TOTAL, FREET4, T3FREE, THYROIDAB in the last 72 hours. Anemia Panel:  Recent Labs  02/15/17 1031  VITAMINB12 200  FOLATE 16.0  FERRITIN 67  TIBC 224*  IRON 29  RETICCTPCT 3.2*   Sepsis Labs: No results for input(s): PROCALCITON, LATICACIDVEN in the last 168 hours.  Recent Results (from the past 240 hour(s))  MRSA PCR Screening     Status: Abnormal   Collection Time: 02/13/17  6:11 PM  Result Value Ref Range Status   MRSA by PCR POSITIVE (A) NEGATIVE  Final    Comment:        The GeneXpert MRSA Assay (FDA approved for NASAL specimens only), is one component of a comprehensive MRSA colonization surveillance program. It is not intended to diagnose MRSA infection nor to guide or monitor treatment for MRSA infections. RESULT CALLED TO, READ BACK BY AND VERIFIED WITHLetitia Libra RN 2224 02/13/17 A [facility]      Radiology Studies: Dg Chest Port 1 View  Result Date: 02/16/2017 CLINICAL DATA:  Congestive heart failure EXAM: PORTABLE CHEST 1 VIEW COMPARISON:  02/13/2017 FINDINGS: Stable cardiomegaly with post CABG change. Aortic atherosclerosis is noted. Mild interstitial edema is seen bilaterally with small layering effusions left greater than right consistent with stigmata of CHF. No acute osseous abnormality. Improved aeration at each lung base with decrease in pleural effusions since prior however. IMPRESSION: 1. Improved aeration at each lung base with decrease in bilateral pleural effusions as well. 2. Cardiomegaly with persistence of mild CHF. Small left and tiny right pleural effusions are currently noted. Electronically Signed   By: Tollie Eth M.D.   On: 02/16/2017 16:13    Scheduled Meds: . acidophilus  1 capsule Oral Daily   . amLODipine  10 mg Oral Daily  . atorvastatin  80 mg Oral QHS  . bumetanide  3 mg Oral BID  . chlorhexidine  15 mL Mouth/Throat BID  . clopidogrel  75 mg Oral Daily  . donepezil  5 mg Oral QHS  . dorzolamide-timolol  1 drop Both Eyes Q12H  . doxazosin  4 mg Oral Daily  . hydrALAZINE  100 mg Oral Q8H  . insulin aspart  2-12 Units Subcutaneous QID  . isosorbide dinitrate  20 mg Oral TID  . latanoprost  1 drop Right Eye QHS  . levothyroxine  100 mcg Oral QAC breakfast  . mirtazapine  15 mg Oral QHS  . pantoprazole  20 mg Oral Q0600  . polyvinyl alcohol  1 drop Left Eye QID  . prednisoLONE acetate  1 drop Left Eye QID  . sertraline  50 mg Oral Daily  . sodium chloride flush  3 mL Intravenous Q12H  . [START ON 03/13/2017] Vitamin D3  50,000 Units Oral Q30 days   Continuous Infusions:   LOS: 4 days    Time spent: Total of 25 minutes spent with pt, greater than 50% of which was spent in discussion of  treatment, counseling and coordination of care   Latrelle Dodrill, MD Pager: Text Page via [email or web address]  [phone]  If 7PM-7AM, please contact night-coverage [email or web address] Password [email or web address] 02/17/2017, 1:34 PM

## 2017-02-17 NOTE — Progress Notes (Signed)
Progress Note  Patient Name: Taylor Padilla Date of Encounter: 02/17/2017  Subjective   No complaints this AM  Inpatient Medications    Scheduled Meds: . acidophilus  1 capsule Oral Daily  . amLODipine  10 mg Oral Daily  . atorvastatin  80 mg Oral QHS  . chlorhexidine  15 mL Mouth/Throat BID  . clopidogrel  75 mg Oral Daily  . donepezil  5 mg Oral QHS  . dorzolamide-timolol  1 drop Both Eyes Q12H  . doxazosin  4 mg Oral Daily  . furosemide  60 mg Intravenous Once  . furosemide  80 mg Intravenous Q8H  . hydrALAZINE  100 mg Oral Q8H  . insulin aspart  2-12 Units Subcutaneous QID  . isosorbide dinitrate  20 mg Oral TID  . latanoprost  1 drop Right Eye QHS  . levothyroxine  100 mcg Oral QAC breakfast  . mirtazapine  15 mg Oral QHS  . pantoprazole  20 mg Oral Q0600  . polyvinyl alcohol  1 drop Left Eye QID  . prednisoLONE acetate  1 drop Left Eye QID  . sertraline  50 mg Oral Daily  . sodium chloride flush  3 mL Intravenous Q12H  . [START ON 03/13/2017] Vitamin D3  50,000 Units Oral Q30 days   Continuous Infusions:  PRN Meds: acetaminophen, ipratropium-albuterol, loperamide, ondansetron   Vital Signs    Vitals:   02/16/17 1433 02/16/17 2226 02/17/17 0500 02/17/17 0506  BP: (!) 156/50 (!) 150/70  (!) 148/64  Pulse: 69     Resp: 18     Temp:      TempSrc:      SpO2: 95%     Weight:   150 lb 2.1 oz (68.1 kg)   Height:        Intake/Output Summary (Last 24 hours) at 02/17/17 0906 Last data filed at 02/17/17 0500  Gross per 24 hour  Intake              480 ml  Output             1300 ml  Net             -820 ml   Filed Weights   02/15/17 0559 02/16/17 0511 02/17/17 0500  Weight: 147 lb 4.3 oz (66.8 kg) 154 lb 1.6 oz (69.9 kg) 150 lb 2.1 oz (68.1 kg)    Telemetry    SR - Personally Reviewed  ECG      Physical Exam   GEN: No acute distress.   Neck:JVD to angle of jaw Cardiac: RRR, 3/6 systolic murmur at apex Respiratory: Clear to auscultation  bilaterally. GI: Soft, nontender, non-distended  MS: No edema; No deformity. Neuro:  Nonfocal  Psych: Normal affect   Labs    Chemistry Recent Labs Lab 02/13/17 1416 02/14/17 0547 02/15/17 1031 02/16/17 0851  NA 139 141 139 138  K 4.1 3.8 3.7 3.5  CL 106 103 102 102  CO2 24 26 25 24   GLUCOSE 193* 137* 209* 126*  BUN 72* 71* 74* 68*  CREATININE 4.67* 4.68* 4.55* 4.15*  CALCIUM 8.6* 8.6* 8.2* 8.3*  PROT 7.1  --   --   --   ALBUMIN 3.5  --   --   --   AST 17  --   --   --   ALT 11*  --   --   --   ALKPHOS 71  --   --   --   BILITOT 0.7  --   --   --  GFRNONAA 9* 8* 9* 10*  GFRAA 10* 10* 10* 11*  ANIONGAP 9 12 12 12      Hematology Recent Labs Lab 02/14/17 0547 02/15/17 1031 02/16/17 0851  WBC 8.5 8.0 6.3  RBC 2.57* 2.28*  2.28* 2.93*  HGB 7.4* 6.8* 8.8*  HCT 23.7* 21.3* 26.3*  MCV 92.2 93.4 89.8  MCH 28.8 29.8 30.0  MCHC 31.2 31.9 33.5  RDW 14.7 15.1 14.8  PLT 244 235 228    Cardiac Enzymes Recent Labs Lab 02/13/17 1741 02/13/17 1939 02/13/17 2259 02/14/17 0547  TROPONINI 0.20* 0.22* 0.25* 0.15*   No results for input(s): TROPIPOC in the last 168 hours.   BNP Recent Labs Lab 02/13/17 1416  BNP 3,008.9*     DDimer No results for input(s): DDIMER in the last 168 hours.   Radiology    Dg Chest Port 1 View  Result Date: 02/16/2017 CLINICAL DATA:  Congestive heart failure EXAM: PORTABLE CHEST 1 VIEW COMPARISON:  02/13/2017 FINDINGS: Stable cardiomegaly with post CABG change. Aortic atherosclerosis is noted. Mild interstitial edema is seen bilaterally with small layering effusions left greater than right consistent with stigmata of CHF. No acute osseous abnormality. Improved aeration at each lung base with decrease in pleural effusions since prior however. IMPRESSION: 1. Improved aeration at each lung base with decrease in bilateral pleural effusions as well. 2. Cardiomegaly with persistence of mild CHF. Small left and tiny right pleural effusions are  currently noted. Electronically Signed   By: Tollie Eth M.D.   On: 02/16/2017 16:13    Cardiac Studies   Echocardiogram 02/14/17: Study Conclusions - Left ventricle: The cavity size was normal. Posterior wall thickness was increased in a pattern of mild LVH. Systolic function was normal. The estimated ejection fraction was in the range of 60% to 65%. Mild hypokinesis of the anteroseptal and inferoseptal myocardium. - Aortic valve: Transvalvular velocity was within the normal range. There was no stenosis. There was trivial regurgitation. - Mitral valve: Calcified annulus. Mobility of the posterior leaflet was mildly restricted. Transvalvular velocity was within the normal range. There was no evidence for stenosis. There was moderate regurgitation. - Left atrium: The atrium was severely dilated. - Right ventricle: The cavity size was moderately dilated. Wall thickness was normal. Systolic function was normal. - Right atrium: The atrium was severely dilated. - Tricuspid valve: There was moderate regurgitation. - Pulmonary arteries: Systolic pressure was severely increased. PA peak pressure: 75 mm Hg (S). - Pericardium, extracardiac: There was a left pleural effusion.   Echocardiogram 08/18/16: Study Conclusions - Left ventricle: The cavity size was normal. There was mild concentric hypertrophy. Systolic function was normal. The estimated ejection fraction was in the range of 50% to 55%.Wall motion was normal; there were no regional wall motion abnormalities. - Aortic valve: Transvalvular velocity was within the normal range. There was no stenosis. There was no regurgitation. 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 There was moderate regurgitation. - Left atrium: The atrium was severely dilated. - Right ventricle: The cavity size was normal. Wall thickness was normal. Systolic function was normal. - Atrial septum: No  defect or patent foramen ovale was identified by color flow Doppler. - Tricuspid valve: There was moderate regurgitation. - Pulmonary arteries: Systolic pressure was severely increased.PA peak pressure: 83 mm Hg (S). - Pericardium, extracardiac: There was a left pleural effusion. Ascites was noted.   Echocardiogram 01/21/16: Study Conclusions - Left ventricle: The cavity size was normal. Wall thickness was increased in a pattern  of mild LVH. Systolic function was normal. The estimated ejection fraction was in the range of 60% to 65%. Wall motion was normal; there were no regional wall motion abnormalities. Features are consistent with a pseudonormal left ventricular filling pattern, with concomitant abnormal relaxation and increased filling pressure (grade 2 diastolic dysfunction). Doppler parameters are consistent with high ventricular filling pressure. - Mitral valve: There was mild regurgitation. - Left atrium: The atrium was severely dilated. - Pulmonary arteries: Systolic pressure was mildly increased.  Impressions: - Normal LV systolic function; grade 2 diastolic dysfunction with elevated LV filling pressure; severe LAE; mild MR and TR; mildly elevated pulonary pressure.  Patient Profile     71 y.o. 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 She presented with acute on chronic diastolic heart failure.  Assessment & Plan   1. Acute on chronic diastolic heart failure with severe pulmonary hypertension  - echo LVEF 60-65%, mod MR, severe LAE, normal RV function, mod TR, PASP 75. Diastolic function not described,  - negative 820 yesterday, though nightshift intake not reported. Reported weights are labile and likely inaccurate. Notes indicate dry weight is 128 lbs - on  lasix IV 80mg  tid. Mild downtrend in Cr and BUN with diuresis, consistent with venous congestion and CHF. Of note she refused labs this AM *patient did not get lasix night dose due to loss of IV access. We will not titrate at this time, if similar diuresis after full dosing tomorrow consider adding metolazone.   2. Elevated troponin - peak 0.25 in setting of systolic HF. Echo with normal LVEF - suspect demand ischemia in setting of CHF and anemia. Given her CKD poor candidate for ischemic testing  3. CAD - continue medical therapy  4.  Anemia - per primary team  5. CKD IV - baseline Cr 4-4.2    Signed, Dina RichBranch, Lucca Ballo, MD  02/17/2017, 9:06 AM

## 2017-02-17 NOTE — Progress Notes (Signed)
Pt refusing morning labs.

## 2017-02-18 ENCOUNTER — Inpatient Hospital Stay (HOSPITAL_COMMUNITY): Payer: Medicare Other

## 2017-02-18 LAB — CBC
HEMATOCRIT: 27.3 % — AB (ref 36.0–46.0)
HEMOGLOBIN: 8.9 g/dL — AB (ref 12.0–15.0)
MCH: 30.1 pg (ref 26.0–34.0)
MCHC: 32.6 g/dL (ref 30.0–36.0)
MCV: 92.2 fL (ref 78.0–100.0)
Platelets: 246 10*3/uL (ref 150–400)
RBC: 2.96 MIL/uL — ABNORMAL LOW (ref 3.87–5.11)
RDW: 14.9 % (ref 11.5–15.5)
WBC: 6.4 10*3/uL (ref 4.0–10.5)

## 2017-02-18 LAB — GLUCOSE, CAPILLARY
GLUCOSE-CAPILLARY: 95 mg/dL (ref 65–99)
Glucose-Capillary: 75 mg/dL (ref 65–99)
Glucose-Capillary: 90 mg/dL (ref 65–99)
Glucose-Capillary: 129 mg/dL — ABNORMAL HIGH (ref 65–99)
Glucose-Capillary: 180 mg/dL — ABNORMAL HIGH (ref 65–99)
Glucose-Capillary: 59 mg/dL — ABNORMAL LOW (ref 65–99)

## 2017-02-18 LAB — BASIC METABOLIC PANEL
Anion gap: 11 (ref 5–15)
BUN: 66 mg/dL — AB (ref 6–20)
CHLORIDE: 101 mmol/L (ref 101–111)
CO2: 28 mmol/L (ref 22–32)
Calcium: 8.6 mg/dL — ABNORMAL LOW (ref 8.9–10.3)
Creatinine, Ser: 4.24 mg/dL — ABNORMAL HIGH (ref 0.44–1.00)
GFR calc Af Amer: 11 mL/min — ABNORMAL LOW (ref >60)
GFR calc non Af Amer: 10 mL/min — ABNORMAL LOW (ref >60)
GLUCOSE: 90 mg/dL (ref 65–99)
POTASSIUM: 3.3 mmol/L — AB (ref 3.5–5.1)
Sodium: 140 mmol/L (ref 135–145)

## 2017-02-18 LAB — BRAIN NATRIURETIC PEPTIDE: B Natriuretic Peptide: 2007.1 pg/mL — ABNORMAL HIGH (ref 0.0–100.0)

## 2017-02-18 MED ORDER — DOXAZOSIN MESYLATE 4 MG PO TABS: 8.0000 mg | ORAL_TABLET | Freq: Every day | ORAL | Status: DC

## 2017-02-18 MED ORDER — BUMETANIDE 1 MG PO TABS
3.0000 mg | ORAL_TABLET | Freq: Two times a day (BID) | ORAL | 0 refills | Status: AC
Start: 2017-02-18 — End: ?

## 2017-02-18 MED ORDER — DOXAZOSIN MESYLATE 8 MG PO TABS: 8.0000 mg | ORAL_TABLET | Freq: Every day | ORAL | Status: AC

## 2017-02-18 NOTE — Progress Notes (Signed)
[facility] She accepted report for the patient. Condition stable. No changes in assessment.

## 2017-02-18 NOTE — Clinical Social Work Note (Addendum)
Pt transferred to: [facility] (Return) Anticipated date of transfer: 02/18/17 Transported by: Ambulance ([facility]) Time Tentatively Scheduled for:  4:15 PM Family notified: V-mails left for Victorino DikeJennifer and Donnamarie RossettiRichard  Janie- [facility] contacted family and signatures obtained for admission Report #: [id number]  Patient aware of d/c and is agreeable. Room Number 705  CSW signing off.  Lovette ClicheDonna Zurii Hewes, LCSW [phone]  (weekend coverage)

## 2017-02-18 NOTE — Progress Notes (Signed)
Progress Note  Patient Name: Taylor Padilla Date of Encounter: 02/18/2017   Subjective   SOB is improving.   Inpatient Medications    Scheduled Meds: . acidophilus  1 capsule Oral Daily  . amLODipine  10 mg Oral Daily  . atorvastatin  80 mg Oral QHS  . bumetanide  3 mg Oral BID  . chlorhexidine  15 mL Mouth/Throat BID  . clopidogrel  75 mg Oral Daily  . donepezil  5 mg Oral QHS  . dorzolamide-timolol  1 drop Both Eyes Q12H  . doxazosin  4 mg Oral Daily  . hydrALAZINE  100 mg Oral Q8H  . insulin aspart  2-12 Units Subcutaneous QID  . isosorbide dinitrate  20 mg Oral TID  . latanoprost  1 drop Right Eye QHS  . levothyroxine  100 mcg Oral QAC breakfast  . mirtazapine  15 mg Oral QHS  . pantoprazole  20 mg Oral Q0600  . polyvinyl alcohol  1 drop Left Eye QID  . prednisoLONE acetate  1 drop Left Eye QID  . sertraline  50 mg Oral Daily  . sodium chloride flush  3 mL Intravenous Q12H  . [START ON 03/13/2017] Vitamin D3  50,000 Units Oral Q30 days   Continuous Infusions:  PRN Meds: acetaminophen, ipratropium-albuterol, loperamide, ondansetron   Vital Signs    Vitals:   02/17/17 0506 02/17/17 1618 02/17/17 2048 02/18/17 0553  BP: (!) 148/64 (!) 133/50 (!) 148/62 (!) 175/57  Pulse:  (!) 55 (!) 57 60  Resp:  18 17 17   Temp:  (!) 100.4 F (38 C) 98.3 F (36.8 C) 98 F (36.7 C)  TempSrc:  Oral Oral Oral  SpO2:  94% 98% 91%  Weight:    151 lb 10.8 oz (68.8 kg)  Height:        Intake/Output Summary (Last 24 hours) at 02/18/17 0851 Last data filed at 02/18/17 0659  Gross per 24 hour  Intake              820 ml  Output              650 ml  Net              170 ml   Filed Weights   02/16/17 0511 02/17/17 0500 02/18/17 0553  Weight: 154 lb 1.6 oz (69.9 kg) 150 lb 2.1 oz (68.1 kg) 151 lb 10.8 oz (68.8 kg)    Telemetry    SR, wandering atrial pacemaker- Personally Reviewed  ECG      Physical Exam   GEN: No acute distress.   Neck: JVD to angle of  jaw Cardiac: RRR,3/6 systolic murmur LLSB Respiratory: Clear to auscultation bilaterally. GI: Soft, nontender, non-distended  MS: No edema; No deformity. Neuro:  Nonfocal  Psych: Normal affect   Labs    Chemistry Recent Labs Lab 02/13/17 1416  02/16/17 0851 02/17/17 0845 02/18/17 0524  NA 139  < > 138 140 140  K 4.1  < > 3.5 3.5 3.3*  CL 106  < > 102 103 101  CO2 24  < > 24 27 28   GLUCOSE 193*  < > 126* 131* 90  BUN 72*  < > 68* 70* 66*  CREATININE 4.67*  < > 4.15* 4.36* 4.24*  CALCIUM 8.6*  < > 8.3* 8.4* 8.6*  PROT 7.1  --   --   --   --   ALBUMIN 3.5  --   --   --   --  AST 17  --   --   --   --   ALT 11*  --   --   --   --   ALKPHOS 71  --   --   --   --   BILITOT 0.7  --   --   --   --   GFRNONAA 9*  < > 10* 9* 10*  GFRAA 10*  < > 11* 11* 11*  ANIONGAP 9  < > 12 10 11   < > = values in this interval not displayed.   Hematology Recent Labs Lab 02/15/17 1031 02/16/17 0851 02/18/17 0524  WBC 8.0 6.3 6.4  RBC 2.28*  2.28* 2.93* 2.96*  HGB 6.8* 8.8* 8.9*  HCT 21.3* 26.3* 27.3*  MCV 93.4 89.8 92.2  MCH 29.8 30.0 30.1  MCHC 31.9 33.5 32.6  RDW 15.1 14.8 14.9  PLT 235 228 246    Cardiac Enzymes Recent Labs Lab 02/13/17 1741 02/13/17 1939 02/13/17 2259 02/14/17 0547  TROPONINI 0.20* 0.22* 0.25* 0.15*   No results for input(s): TROPIPOC in the last 168 hours.   BNP Recent Labs Lab 02/13/17 1416 02/17/17 0845 02/18/17 0524  BNP 3,008.9* 2,549.3* 2,007.1*     DDimer No results for input(s): DDIMER in the last 168 hours.   Radiology    Dg Chest Port 1 View  Result Date: 02/18/2017 CLINICAL DATA:  CHF (congestive heart failure) EXAM: PORTABLE CHEST 1 VIEW COMPARISON:  02/16/2017 FINDINGS: Status post median sternotomy and CABG. Heart is enlarged. There are bilateral pleural effusions. Increased bibasilar opacities partially obscures the hemidiaphragms. Changes of interstitial edema are present, IMPRESSION: Cardiomegaly and waxing and waning diffuse  opacities, consistent with congestive failure. Electronically Signed   By: Norva PavlovElizabeth  Brown M.D.   On: 02/18/2017 07:31   Dg Chest Port 1 View  Result Date: 02/16/2017 CLINICAL DATA:  Congestive heart failure EXAM: PORTABLE CHEST 1 VIEW COMPARISON:  02/13/2017 FINDINGS: Stable cardiomegaly with post CABG change. Aortic atherosclerosis is noted. Mild interstitial edema is seen bilaterally with small layering effusions left greater than right consistent with stigmata of CHF. No acute osseous abnormality. Improved aeration at each lung base with decrease in pleural effusions since prior however. IMPRESSION: 1. Improved aeration at each lung base with decrease in bilateral pleural effusions as well. 2. Cardiomegaly with persistence of mild CHF. Small left and tiny right pleural effusions are currently noted. Electronically Signed   By: Tollie Ethavid  Kwon M.D.   On: 02/16/2017 16:13    Cardiac Studies   Echocardiogram 02/14/17: Study Conclusions - Left ventricle: The cavity size was normal. Posterior wall thickness was increased in a pattern of mild LVH. Systolic function was normal. The estimated ejection fraction was in the range of 60% to 65%.Mild hypokinesis of the anteroseptal and inferoseptal myocardium. - Aortic valve: Transvalvular velocity was within the normal range. There was no stenosis. There was trivial regurgitation. - Mitral valve: Calcified annulus. Mobility of the posterior leaflet was mildly restricted. Transvalvular velocity was within the normal range. There was no evidence for stenosis. There was moderate regurgitation. - Left atrium: The atrium was severely dilated. - Right ventricle: The cavity size was moderately dilated. Wall thickness was normal. Systolic function was normal. - Right atrium: The atrium was severely dilated. - Tricuspid valve: There was moderate regurgitation. - Pulmonary arteries:Systolic pressure was severely increased. PA peak  pressure: 75 mm Hg (S). - Pericardium, extracardiac: There was a left pleural effusion.   Echocardiogram 08/18/16: Study Conclusions - Left ventricle:  The cavity size was normal. There was mild concentric hypertrophy. Systolic function was normal. The estimated ejection fraction was in the range of 50% to 55%.Wall motion was normal; there were no regional wall motion abnormalities. - Aortic valve: Transvalvular velocity was within the normal range. There was no stenosis. There was no regurgitation. 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 There was moderate regurgitation. - Left atrium: The atrium was severely dilated. - Right ventricle: The cavity size was normal. Wall thickness was normal. Systolic function was normal. - Atrial septum: No defect or patent foramen ovale was identified by color flow Doppler. - Tricuspid valve: There was moderate regurgitation. - Pulmonary arteries: Systolic pressure was severely increased.PA peak pressure: 83 mm Hg (S). - Pericardium, extracardiac: There was a left pleural effusion. Ascites was noted.   Echocardiogram 01/21/16: Study Conclusions - Left ventricle: The cavity size was normal. Wall thickness was increased in a pattern of mild LVH. Systolic function was normal. The estimated ejection fraction was in the range of 60% to 65%. Wall motion was normal; there were no regional wall motion abnormalities. Features are consistent with a pseudonormal left ventricular filling pattern, with concomitant abnormal relaxation and increased filling pressure (grade 2 diastolic dysfunction). Doppler parameters are consistent with high ventricular filling pressure. - Mitral valve: There was mild regurgitation. - Left atrium: The atrium was severely dilated. - Pulmonary arteries: Systolic pressure was mildly increased.  Impressions: - Normal LV systolic function; grade 2 diastolic  dysfunction with elevated LV filling pressure; severe LAE; mild MR and TR; mildly elevated pulonary pressure.  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 She presented with acute on chronic diastolic heart failure.  Assessment & Plan    1. Acute on chronic diastolic heart failure with severe pulmonary hypertension  - echo LVEF 60-65%, mod MR, severe LAE, normal RV function, mod TR, PASP 75. Diastolic function not described,  - +142mL , though nightshift intake not reported. Reported weights are labile and likely inaccurate. Notes indicate dry weight is 128 lbs - she lost IV access, has refused attempts at new IV placement according to nursing notes. Primary team changed to bumex 3mg  bid. Renal function reamains stable, continue oral diuretics. Remains volume overloaded by exam.    2. Elevated troponin - peak 0.25 in setting of systolic HF. Echo with normal LVEF - suspect demand ischemia in setting of CHF and anemia. Given her CKD poor candidate for ischemic testing  3. CAD - continue medical therapy  4.  Anemia - per primary team  5. CKD IV - baseline Cr 4-4.2  6. 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 In some leads due to low P wave amplitude looks like afib, however on multilead particularly including V1 tele tracings there are clear P waves.   Joanie Coddington, MD  02/18/2017, 8:51 AM

## 2017-02-18 NOTE — Discharge Summary (Addendum)
Physician Discharge Summary  Taylor Padilla  XBM:[id number]  DOB: Apr 14, 1946  DOA: 02/13/2017 PCP: Georgann Housekeeper, MD  Admit date: 02/13/2017 Discharge date: 02/18/2017  Admitted From: SNF  Disposition:  SNF - [facility]  Recommendations for Outpatient Follow-up:  1. Follow up with PCP in 1week 2. Please obtain BMP/CBC in one week to monitor hemoglobin and kidney function 3. Please have patient to follow-up with nephrologist as an outpatient 4. Follow-up with cardiology in 1-2 weeks  Discharge Condition: Stable CODE STATUS: Full code Diet recommendation: Heart Healthy   Brief/Interim Summary: Taylor Padilla is a 71 year old female with medical history of chronic diastolic CHF, hypertension, CAD status post MI, diabetes, legally blind, presented to the emergency department with low oxygen saturation from SNF. In the ED she was found to be hypoxic with O2 sats of 65 which improved after nasal cannula was placed also was noted to have hemoglobin of 8.3 and creatinine of 4.67 chest x-ray showed bilateral lung opacities consistent with congestive heart failure. She was admitted for acute CHF and diuresis and to monitor kidney function due to renal insufficiency. Patient was given aggressive IV diuresis with Lasix which responded well. Subsequently she was transitioned to oral Bumex and patient continues to have good urine output. Cardiology was consulted for elevated troponin and deemed that was related to demand ischemia from CHF, given troponins were flat and no EKG changes no further ischemic workup was necessary. Blood pressure was elevated during hospital stay for which BP medications were adjusted. Patient's hemoglobin went as low as 6.8, and patient was transfused 1 unit of PRBCs, and is felt to be due to hemodilution and subsequently overloading. Hemoglobin has remained stable after transfusion. CKD remained stable during hospital stay with mild improvement after diuresis. Patient has  remained stable with oral medication for 24 hours and will be discharged to nursing home. She will continue oral diuresis and will need to follow-up with nephrology and cardiologist as an outpatient.  Subjective: Patient seen and examined on the day of discharge. She has no complaints she is now off oxygen and breathing well, saturations are above 95% on room air. No acute events overnight patient remains afebrile.  Discharge Diagnoses/Hospital Course:  Acute respiratory failure with hypoxia secondary to acute on chronic diastolic heart failure. Resolved patient off O2 and sating well  Initial presentation consistent with fluid overload, patient with crackles, BNP over 3,000, chest x-ray consistent with CHF.   Acute on chronic diastolic CHF with severe pulmonary HTN  Echo done showed normal LVEF with EF of 60-65% and mild LVH, severe pulmonary hypertension with the PASP 75 mgHg and moderate dilated RV (indicating diastolic dfx)  Diurese well, with Lasix 80 mg IV, transitioned to oral Bumex 3 mg BID with good UOP  CXR and BNP improving  I&O's continue  were inaccurate, BNP trending down  Continue Fluid restriction to 1500 mL and low salt diet  Not on ACE inhibitor or Aldactone due to CKD No beta blocker due to history of bradycardia   Elevated troponin Troponin flat trending down, no ischemic changes on EKG. This is likely demand ischemia from CHF. No further ischemic workup at this time   Essential hypertension - above goal  Cardiology recommending to increase doxazosin to 4 mg and subsequently increased to 8 mg  Continue Norvasc 10 mg daily and hydralazine 100 mg TID   CKD stage 5  Cr seems to be stable - improved some with diuresis  Check BMP in 1 week and adjust diuretics  per renal function  Patient is not candidate for HD   Anemia of chronic disease  Initially felt to be due to hemodilution as patient received fluid, then patient with significant fluid overload likely  contributing  S/p 1 unit PRBCs Anemia panel negative   Type 2 diabetes mellitus - CBG stable Continue NovoLog Monitor CBGs  All other chronic medical condition were stable during the hospitalization.  On the day of the discharge the patient's vitals were stable, and no other acute medical condition were reported by patient. Patient was felt safe to be discharge to home   Discharge Instructions  You were cared for by a hospitalist during your hospital stay. If you have any questions about your discharge medications or the care you received while you were in the hospital after you are discharged, you can call the unit and asked to speak with the hospitalist on call if the hospitalist that took care of you is not available. Once you are discharged, your primary care physician will handle any further medical issues. Please note that NO REFILLS for any discharge medications will be authorized once you are discharged, as it is imperative that you return to your primary care physician (or establish a relationship with a primary care physician if you do not have one) for your aftercare needs so that they can reassess your need for medications and monitor your lab values.  Discharge Instructions    (HEART FAILURE PATIENTS) Call MD:  Anytime you have any of the following symptoms: 1) 3 pound weight gain in 24 hours or 5 pounds in 1 week 2) shortness of breath, with or without a dry hacking cough 3) swelling in the hands, feet or stomach 4) if you have to sleep on extra pillows at night in order to breathe.    Complete by:  As directed    Call MD for:  difficulty breathing, headache or visual disturbances    Complete by:  As directed    Call MD for:  extreme fatigue    Complete by:  As directed    Call MD for:  hives    Complete by:  As directed    Call MD for:  persistant dizziness or light-headedness    Complete by:  As directed    Call MD for:  persistant nausea and vomiting    Complete by:  As  directed    Call MD for:  redness, tenderness, or signs of infection (pain, swelling, redness, odor or green/yellow discharge around incision site)    Complete by:  As directed    Call MD for:  severe uncontrolled pain    Complete by:  As directed    Call MD for:  temperature >100.4    Complete by:  As directed    Diet - low sodium heart healthy    Complete by:  As directed    Increase activity slowly    Complete by:  As directed      Allergies as of 02/18/2017      Reactions   Epinephrine Other (See Comments)   Patient says she is not allergic to epi, but that it makes her jittery.  WILL RECEIVE IF NEEDED IN AN EMERGENCY.      Medication List    STOP taking these medications   furosemide 40 MG tablet Commonly known as:  LASIX   loperamide 2 MG tablet Commonly known as:  IMODIUM A-D   MILK OF MAGNESIA PO     TAKE these  medications   acetaminophen 500 MG tablet Commonly known as:  TYLENOL Take 500 mg by mouth every 4 (four) hours as needed for fever (pain).   amLODipine 10 MG tablet Commonly known as:  NORVASC Take 10 mg by mouth daily.   atorvastatin 80 MG tablet Commonly known as:  LIPITOR Take 80 mg by mouth at bedtime.   bumetanide 1 MG tablet Commonly known as:  BUMEX Take 3 tablets (3 mg total) by mouth 2 (two) times daily.   chlorhexidine 0.12 % solution Commonly known as:  PERIDEX Use as directed 15 mLs in the mouth or throat 2 (two) times daily.   clopidogrel 75 MG tablet Commonly known as:  PLAVIX Take 75 mg by mouth daily.   Darbepoetin Alfa 25 MCG/0.42ML Sosy injection Commonly known as:  ARANESP Inject 25 mcg into the skin every 7 (seven) days. On Wednesday   donepezil 5 MG tablet Commonly known as:  ARICEPT Take 5 mg by mouth at bedtime.   dorzolamide-timolol 22.3-6.8 MG/ML ophthalmic solution Commonly known as:  COSOPT Place 1 drop into both eyes every 12 (twelve) hours.   doxazosin 8 MG tablet Commonly known as:  CARDURA Take 1  tablet (8 mg total) by mouth daily. What changed:  medication strength  how much to take   hydrALAZINE 100 MG tablet Commonly known as:  APRESOLINE Take 1 tablet (100 mg total) by mouth every 8 (eight) hours.   insulin aspart 100 UNIT/ML injection Commonly known as:  novoLOG Inject 2-12 Units into the skin 4 (four) times daily. Per sliding scale: CBG 151-200 2 units, 201-250 4 units, 251-300 6 units, 301-350 8 units, 351-400 10 units, 401-450 12 units, Call MD for CBG >451 or <60   isosorbide dinitrate 20 MG tablet Commonly known as:  ISORDIL Take 20 mg by mouth 3 (three) times daily. 9am, 1pm, 5pm   lactobacillus acidophilus Tabs tablet Take 1 tablet by mouth daily.   latanoprost 0.005 % ophthalmic solution Commonly known as:  XALATAN Place 1 drop into the right eye at bedtime.   levothyroxine 100 MCG tablet Commonly known as:  SYNTHROID, LEVOTHROID Take 1 tablet (100 mcg total) by mouth daily before breakfast.   Melatonin 3 MG Tabs Take 3 mg by mouth at bedtime.   mirtazapine 15 MG tablet Commonly known as:  REMERON Take 15 mg by mouth at bedtime. For appetite   ondansetron 4 MG tablet Commonly known as:  ZOFRAN Take 4 mg by mouth every 6 (six) hours as needed for nausea or vomiting.   OXYGEN Inhale 2 L into the lungs See admin instructions. "Apply 2 liters per minute via nasal canula for shortness of breath"   pantoprazole 20 MG tablet Commonly known as:  PROTONIX Take 20 mg by mouth daily at 6 (six) AM.   polyvinyl alcohol 1.4 % ophthalmic solution Commonly known as:  LIQUIFILM TEARS Place 1 drop into the left eye 4 (four) times daily.   prednisoLONE acetate 1 % ophthalmic suspension Commonly known as:  PRED FORTE Place 1 drop into the left eye 4 (four) times daily.   sertraline 50 MG tablet Commonly known as:  ZOLOFT Take 50 mg by mouth daily.   Vitamin D3 50000 units Caps Take 50,000 Units by mouth every 30 (thirty) days. Take 1 capsule (50,000  units) by mouth on the 10th of each month       Contact information for follow-up providers    Georgann Housekeeper, MD. Schedule an appointment as soon as  possible for a visit.   Specialty:  Internal Medicine Why:  Hospital follow up  Contact information: [address] [place] [address] [phone]            Contact information for after-discharge care    Destination    [facility] Follow up.   Specialty:  Skilled Nursing Facility Contact information: [address] [place] [address] [phone]                 Allergies  Allergen Reactions  . Epinephrine Other (See Comments)    Patient says she is not allergic to epi, but that it makes her jittery.  WILL RECEIVE IF NEEDED IN AN EMERGENCY.    Consultations:  Cardiology   Procedures/Studies: Dg Chest Port 1 View  Result Date: 02/18/2017 CLINICAL DATA:  CHF (congestive heart failure) EXAM: PORTABLE CHEST 1 VIEW COMPARISON:  02/16/2017 FINDINGS: Status post median sternotomy and CABG. Heart is enlarged. There are bilateral pleural effusions. Increased bibasilar opacities partially obscures the hemidiaphragms. Changes of interstitial edema are present, IMPRESSION: Cardiomegaly and waxing and waning diffuse opacities, consistent with congestive failure. Electronically Signed   By: Norva Pavlov M.D.   On: 02/18/2017 07:31   Dg Chest Port 1 View  Result Date: 02/16/2017 CLINICAL DATA:  Congestive heart failure EXAM: PORTABLE CHEST 1 VIEW COMPARISON:  02/13/2017 FINDINGS: Stable cardiomegaly with post CABG change. Aortic atherosclerosis is noted. Mild interstitial edema is seen bilaterally with small layering effusions left greater than right consistent with stigmata of CHF. No acute osseous abnormality. Improved aeration at each lung base with decrease in pleural effusions since prior however. IMPRESSION: 1. Improved aeration at each lung base with decrease  in bilateral pleural effusions as well. 2. Cardiomegaly with persistence of mild CHF. Small left and tiny right pleural effusions are currently noted. Electronically Signed   By: Tollie Eth M.D.   On: 02/16/2017 16:13   Dg Chest Portable 1 View  Result Date: 02/13/2017 CLINICAL DATA:  Shortness of breath. EXAM: PORTABLE CHEST 1 VIEW COMPARISON:  Radiograph of August 24, 2016. FINDINGS: Stable cardiomegaly . Atherosclerosis of thoracic aorta is noted. No pneumothorax is noted. Bilateral perihilar and basilar densities are noted most consistent with pulmonary edema with associated pleural effusions. Bony thorax is unremarkable. Status post coronary artery bypass graft IMPRESSION: Aortic atherosclerosis. Moderate bilateral perihilar and basilar lung opacities are noted concerning for edema with associated pleural effusions. These findings are concerning for congestive heart failure. Electronically Signed   By: Lupita Raider, M.D.   On: 02/13/2017 14:15   ECHO 02/14/17  ------------------------------------------------------------------- Study Conclusions  - Left ventricle: The cavity size was normal. Posterior wall   thickness was increased in a pattern of mild LVH. Systolic   function was normal. The estimated ejection fraction was in the   range of 60% to 65%. Mild hypokinesis of the anteroseptal and   inferoseptal myocardium. - Aortic valve: Transvalvular velocity was within the normal range.   There was no stenosis. There was trivial regurgitation. - Mitral valve: Calcified annulus. Mobility of the posterior   leaflet was mildly restricted. Transvalvular velocity was within   the normal range. There was no evidence for stenosis. There was   moderate regurgitation. - Left atrium: The atrium was severely dilated. - Right ventricle: The cavity size was moderately dilated. Wall   thickness was normal. Systolic function was normal. - Right atrium: The atrium was severely dilated. -  Tricuspid valve: There  was moderate regurgitation. - Pulmonary arteries: Systolic pressure was severely increased. PA   peak pressure: 75 mm Hg (S). - Pericardium, extracardiac: There was a left pleural effusion.   Discharge Exam: Vitals:   02/17/17 2048 02/18/17 0553  BP: (!) 148/62 (!) 175/57  Pulse: (!) 57 60  Resp: 17 17  Temp: 98.3 F (36.8 C) 98 F (36.7 C)   Vitals:   02/17/17 0506 02/17/17 1618 02/17/17 2048 02/18/17 0553  BP: (!) 148/64 (!) 133/50 (!) 148/62 (!) 175/57  Pulse:  (!) 55 (!) 57 60  Resp:  18 17 17   Temp:  (!) 100.4 F (38 C) 98.3 F (36.8 C) 98 F (36.7 C)  TempSrc:  Oral Oral Oral  SpO2:  94% 98% 91%  Weight:    68.8 kg (151 lb 10.8 oz)  Height:        General: Pt is alert, awake, not in acute distress, blind  Cardiovascular: RRR, S1/S2 + 3/6 SM, no rubs, no gallops Respiratory: Good air entry significant improvement from admission, mild crackles at the lower bases. Normal respiratory effort  Abdominal: Soft, NT, ND, bowel sounds + Extremities: no LE edema, no cyanosis   The results of significant diagnostics from this hospitalization (including imaging, microbiology, ancillary and laboratory) are listed below for reference.     Microbiology: Recent Results (from the past 240 hour(s))  MRSA PCR Screening     Status: Abnormal   Collection Time: 02/13/17  6:11 PM  Result Value Ref Range Status   MRSA by PCR POSITIVE (A) NEGATIVE Final    Comment:        The GeneXpert MRSA Assay (FDA approved for NASAL specimens only), is one component of a comprehensive MRSA colonization surveillance program. It is not intended to diagnose MRSA infection nor to guide or monitor treatment for MRSA infections. RESULT CALLED TO, READ BACK BY AND VERIFIED WITH: S SEAGRAVES RN 2224 02/13/17 A NAVARRO      Labs: BNP (last 3 results)  Recent Labs  02/13/17 1416 02/17/17 0845 02/18/17 0524  BNP 3,008.9* 2,549.3* 2,007.1*   Basic Metabolic  Panel:  Recent Labs Lab 02/14/17 0547 02/15/17 1031 02/16/17 0851 02/17/17 0845 02/18/17 0524  NA 141 139 138 140 140  K 3.8 3.7 3.5 3.5 3.3*  CL 103 102 102 103 101  CO2 26 25 24 27 28   GLUCOSE 137* 209* 126* 131* 90  BUN 71* 74* 68* 70* 66*  CREATININE 4.68* 4.55* 4.15* 4.36* 4.24*  CALCIUM 8.6* 8.2* 8.3* 8.4* 8.6*   Liver Function Tests:  Recent Labs Lab 02/13/17 1416  AST 17  ALT 11*  ALKPHOS 71  BILITOT 0.7  PROT 7.1  ALBUMIN 3.5   No results for input(s): LIPASE, AMYLASE in the last 168 hours. No results for input(s): AMMONIA in the last 168 hours. CBC:  Recent Labs Lab 02/13/17 1416 02/14/17 0547 02/15/17 1031 02/16/17 0851 02/18/17 0524  WBC 9.0 8.5 8.0 6.3 6.4  NEUTROABS 8.4*  --   --   --   --   HGB 8.3* 7.4* 6.8* 8.8* 8.9*  HCT 26.2* 23.7* 21.3* 26.3* 27.3*  MCV 92.9 92.2 93.4 89.8 92.2  PLT 270 244 235 228 246   Cardiac Enzymes:  Recent Labs Lab 02/13/17 1416 02/13/17 1741 02/13/17 1939 02/13/17 2259 02/14/17 0547  TROPONINI 0.14* 0.20* 0.22* 0.25* 0.15*   BNP: Invalid input(s): POCBNP CBG:  Recent Labs Lab 02/17/17 2042 02/18/17 0330 02/18/17 0545 02/18/17 0757 02/18/17 1110  GLUCAP 180* 75  90 129* 180*   D-Dimer No results for input(s): DDIMER in the last 72 hours. Hgb A1c No results for input(s): HGBA1C in the last 72 hours. Lipid Profile No results for input(s): CHOL, HDL, LDLCALC, TRIG, CHOLHDL, LDLDIRECT in the last 72 hours. Thyroid function studies No results for input(s): TSH, T4TOTAL, T3FREE, THYROIDAB in the last 72 hours.  Invalid input(s): FREET3 Anemia work up No results for input(s): VITAMINB12, FOLATE, FERRITIN, TIBC, IRON, RETICCTPCT in the last 72 hours. Urinalysis    Component Value Date/Time   COLORURINE YELLOW 01/15/2016 2112   APPEARANCEUR TURBID (A) 01/15/2016 2112   LABSPEC 1.015 01/15/2016 2112   PHURINE 5.0 01/15/2016 2112   GLUCOSEU 250 (A) 01/15/2016 2112   HGBUR LARGE (A) 01/15/2016  2112   BILIRUBINUR NEGATIVE 01/15/2016 2112   KETONESUR NEGATIVE 01/15/2016 2112   PROTEINUR 100 (A) 01/15/2016 2112   UROBILINOGEN 0.2 07/10/2015 0401   NITRITE NEGATIVE 01/15/2016 2112   LEUKOCYTESUR LARGE (A) 01/15/2016 2112   Sepsis Labs Invalid input(s): PROCALCITONIN,  WBC,  LACTICIDVEN Microbiology Recent Results (from the past 240 hour(s))  MRSA PCR Screening     Status: Abnormal   Collection Time: 02/13/17  6:11 PM  Result Value Ref Range Status   MRSA by PCR POSITIVE (A) NEGATIVE Final    Comment:        The GeneXpert MRSA Assay (FDA approved for NASAL specimens only), is one component of a comprehensive MRSA colonization surveillance program. It is not intended to diagnose MRSA infection nor to guide or monitor treatment for MRSA infections. RESULT CALLED TO, READ BACK BY AND VERIFIED WITHLetitia Libra RN 2224 02/13/17 A NAVARRO      Time coordinating discharge: 35 minutes  SIGNED:  Latrelle Dodrill, MD  [facility] 02/18/2017, 12:36 PM  Pager please text page via  [email or web address] Password [email or web address]

## 2017-02-19 LAB — TYPE AND SCREEN
ABO/RH(D): O POS
ANTIBODY SCREEN: POSITIVE
DAT, IgG: NEGATIVE
DONOR AG TYPE: NEGATIVE
Unit division: 0

## 2017-02-19 LAB — BPAM RBC
Blood Product Expiration Date: 201807012359
ISSUE DATE / TIME: 201806150017
UNIT TYPE AND RH: 5100

## 2017-06-25 ENCOUNTER — Encounter (HOSPITAL_COMMUNITY): Payer: Self-pay | Admitting: Internal Medicine

## 2017-09-16 IMAGING — CR DG TOE GREAT 2+V*L*
3 series · 3 of 3 positions shown · non-contrast
Comparison: None.

CLINICAL DATA: Pain of left great toe

EXAM:
LEFT GREAT TOE

[x toes ap left]
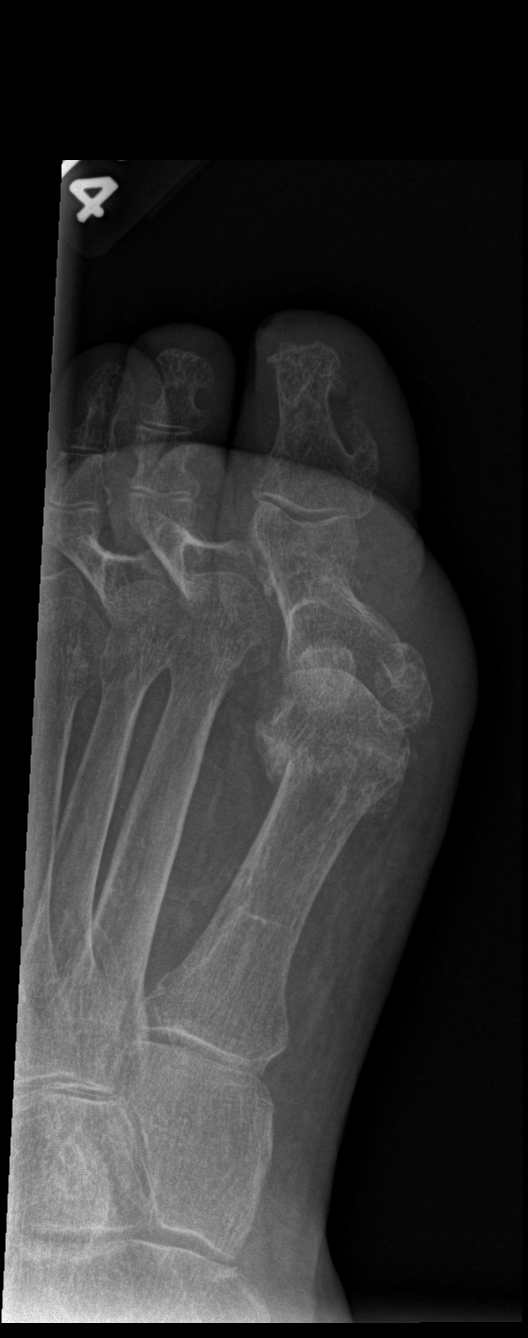

[x toes obl left]
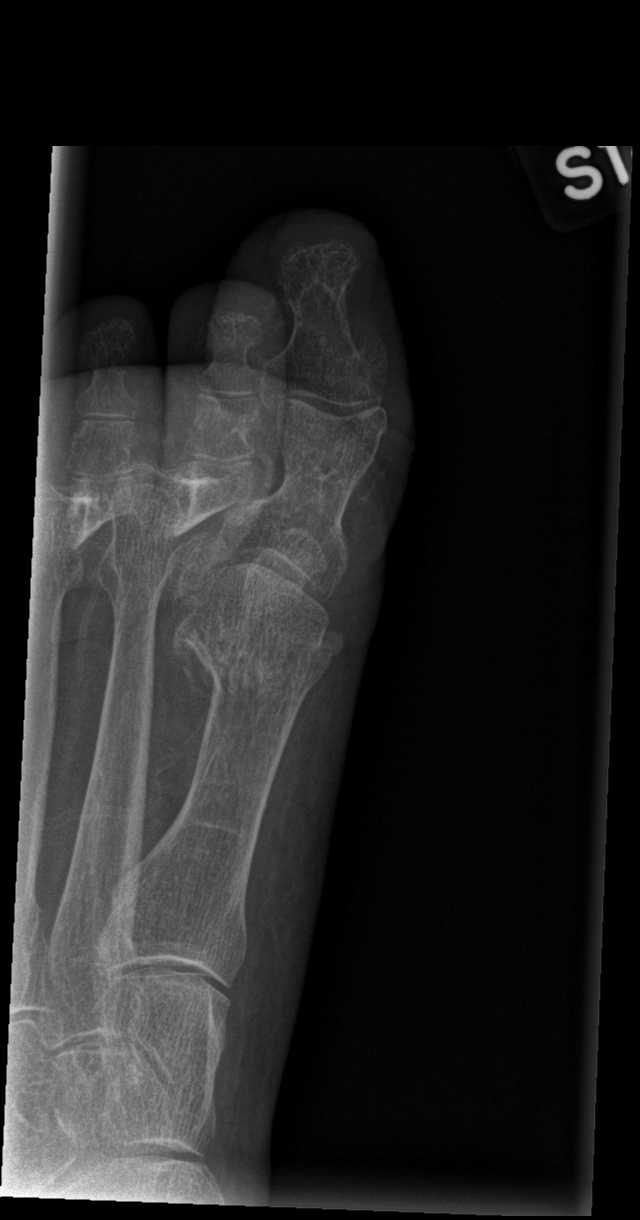

[x toes lat left]
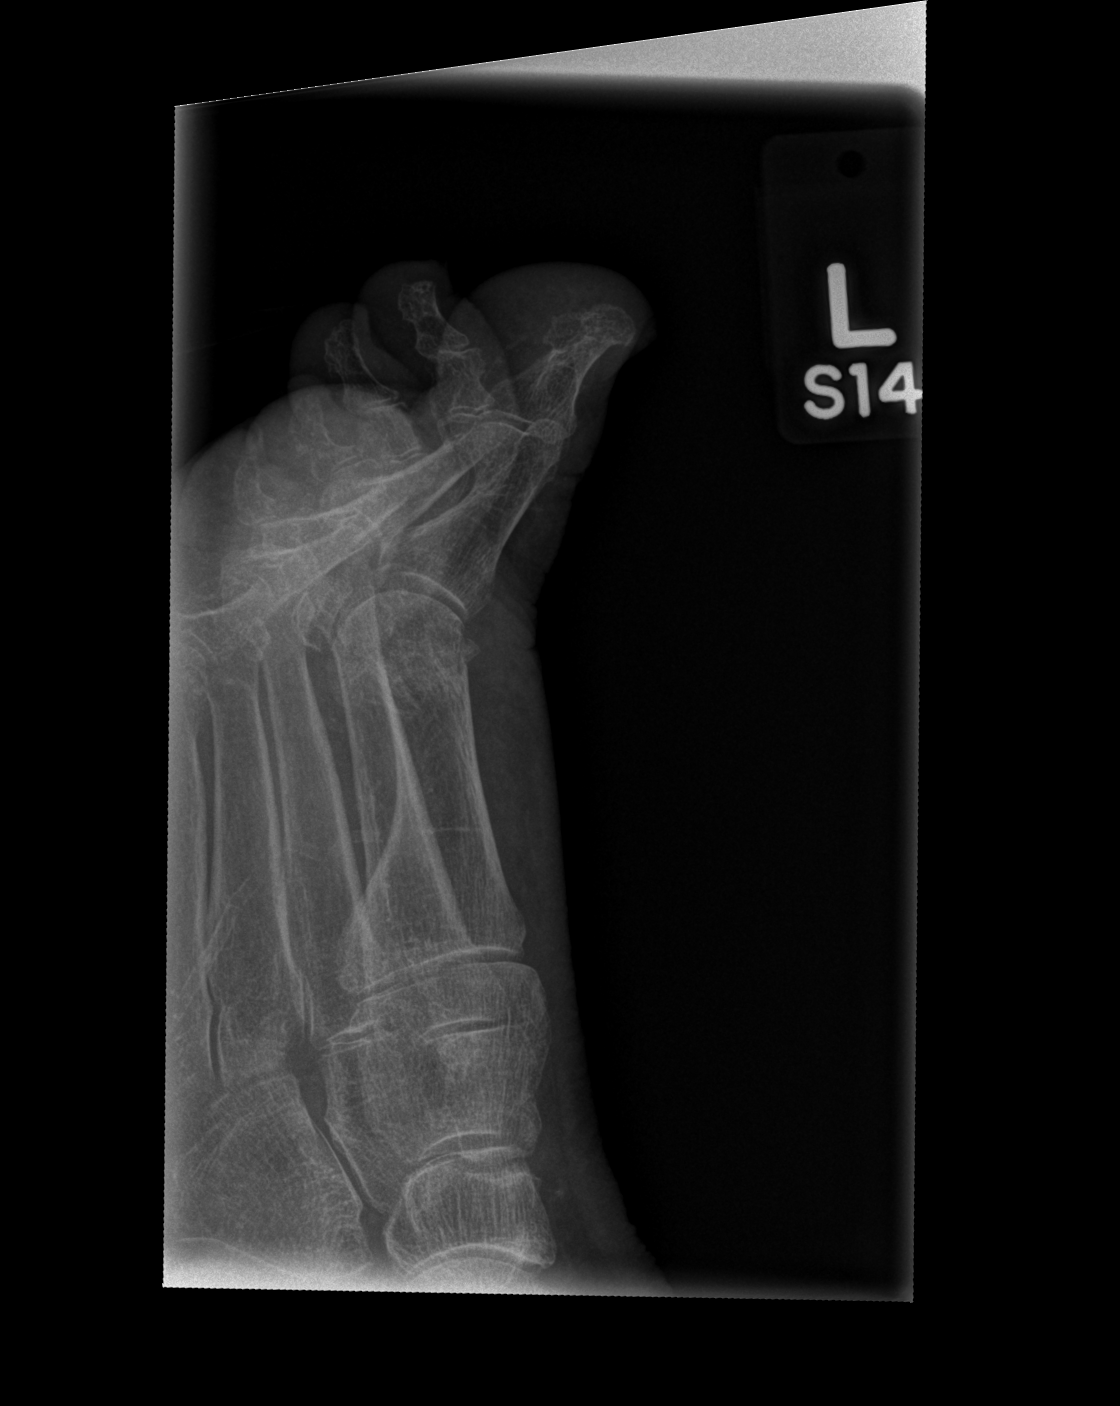

[3 of 3 positions shown; findings below may reference images not displayed]

FINDINGS: There is healing fracture of distal first metatarsal. There is no
dislocation.
IMPRESSION: Healing fracture of distal first metatarsal.

## 2018-01-02 DEATH — deceased
